# Patient Record
Sex: Female | Born: 1945 | Race: White | Hispanic: No | Smoking: Never smoker
Health system: Southern US, Community
[De-identification: ages and names within clinical notes are randomized; demographics above are authoritative.]

## PROBLEM LIST (undated history)

## (undated) DIAGNOSIS — F329 Major depressive disorder, single episode, unspecified: Secondary | ICD-10-CM

## (undated) DIAGNOSIS — F419 Anxiety disorder, unspecified: Secondary | ICD-10-CM

## (undated) DIAGNOSIS — J40 Bronchitis, not specified as acute or chronic: Secondary | ICD-10-CM

## (undated) DIAGNOSIS — I482 Chronic atrial fibrillation, unspecified: Secondary | ICD-10-CM

## (undated) DIAGNOSIS — I119 Hypertensive heart disease without heart failure: Secondary | ICD-10-CM

## (undated) DIAGNOSIS — K7460 Unspecified cirrhosis of liver: Secondary | ICD-10-CM

## (undated) DIAGNOSIS — I252 Old myocardial infarction: Secondary | ICD-10-CM

## (undated) DIAGNOSIS — K580 Irritable bowel syndrome with diarrhea: Secondary | ICD-10-CM

## (undated) DIAGNOSIS — R404 Transient alteration of awareness: Secondary | ICD-10-CM

## (undated) DIAGNOSIS — F411 Generalized anxiety disorder: Secondary | ICD-10-CM

## (undated) DIAGNOSIS — M797 Fibromyalgia: Secondary | ICD-10-CM

## (undated) DIAGNOSIS — I509 Heart failure, unspecified: Secondary | ICD-10-CM

## (undated) DIAGNOSIS — G629 Polyneuropathy, unspecified: Secondary | ICD-10-CM

## (undated) DIAGNOSIS — M21372 Foot drop, left foot: Secondary | ICD-10-CM

## (undated) DIAGNOSIS — K219 Gastro-esophageal reflux disease without esophagitis: Secondary | ICD-10-CM

## (undated) DIAGNOSIS — R600 Localized edema: Secondary | ICD-10-CM

## (undated) DIAGNOSIS — I2510 Atherosclerotic heart disease of native coronary artery without angina pectoris: Secondary | ICD-10-CM

## (undated) DIAGNOSIS — I10 Essential (primary) hypertension: Secondary | ICD-10-CM

## (undated) DIAGNOSIS — E119 Type 2 diabetes mellitus without complications: Secondary | ICD-10-CM

## (undated) DIAGNOSIS — F32A Depression, unspecified: Secondary | ICD-10-CM

## (undated) DIAGNOSIS — R161 Splenomegaly, not elsewhere classified: Secondary | ICD-10-CM

## (undated) DIAGNOSIS — K769 Liver disease, unspecified: Secondary | ICD-10-CM

## (undated) DIAGNOSIS — Z9989 Dependence on other enabling machines and devices: Secondary | ICD-10-CM

## (undated) DIAGNOSIS — G4730 Sleep apnea, unspecified: Secondary | ICD-10-CM

## (undated) DIAGNOSIS — G4733 Obstructive sleep apnea (adult) (pediatric): Secondary | ICD-10-CM

## (undated) DIAGNOSIS — E7800 Pure hypercholesterolemia, unspecified: Secondary | ICD-10-CM

## (undated) DIAGNOSIS — D696 Thrombocytopenia, unspecified: Secondary | ICD-10-CM

## (undated) DIAGNOSIS — E782 Mixed hyperlipidemia: Secondary | ICD-10-CM

## (undated) DIAGNOSIS — M1990 Unspecified osteoarthritis, unspecified site: Secondary | ICD-10-CM

## (undated) DIAGNOSIS — I219 Acute myocardial infarction, unspecified: Secondary | ICD-10-CM

## (undated) DIAGNOSIS — J302 Other seasonal allergic rhinitis: Secondary | ICD-10-CM

## (undated) DIAGNOSIS — N393 Stress incontinence (female) (male): Secondary | ICD-10-CM

## (undated) DIAGNOSIS — T4145XA Adverse effect of unspecified anesthetic, initial encounter: Secondary | ICD-10-CM

## (undated) DIAGNOSIS — H9190 Unspecified hearing loss, unspecified ear: Secondary | ICD-10-CM

## (undated) DIAGNOSIS — I5032 Chronic diastolic (congestive) heart failure: Secondary | ICD-10-CM

## (undated) DIAGNOSIS — T8859XA Other complications of anesthesia, initial encounter: Secondary | ICD-10-CM

## (undated) DIAGNOSIS — N3944 Nocturnal enuresis: Secondary | ICD-10-CM

## (undated) DIAGNOSIS — E039 Hypothyroidism, unspecified: Secondary | ICD-10-CM

## (undated) DIAGNOSIS — I517 Cardiomegaly: Secondary | ICD-10-CM

## (undated) DIAGNOSIS — E785 Hyperlipidemia, unspecified: Secondary | ICD-10-CM

## (undated) DIAGNOSIS — M21371 Foot drop, right foot: Secondary | ICD-10-CM

## (undated) DIAGNOSIS — R400 Somnolence: Secondary | ICD-10-CM

## (undated) DIAGNOSIS — J3089 Other allergic rhinitis: Secondary | ICD-10-CM

## (undated) HISTORY — DX: Obstructive sleep apnea (adult) (pediatric): G47.33

## (undated) HISTORY — PX: TOTAL ABDOMINAL HYSTERECTOMY: SHX209

## (undated) HISTORY — PX: SPLENECTOMY: SUR1306

## (undated) HISTORY — DX: Anxiety disorder, unspecified: F41.9

## (undated) HISTORY — DX: Atherosclerotic heart disease of native coronary artery without angina pectoris: I25.10

## (undated) HISTORY — PX: EYE SURGERY: SHX253

## (undated) HISTORY — DX: Liver disease, unspecified: K76.9

## (undated) HISTORY — DX: Heart failure, unspecified: I50.9

## (undated) HISTORY — DX: Transient alteration of awareness: R40.4

## (undated) HISTORY — DX: Somnolence: R40.0

## (undated) HISTORY — DX: Depression, unspecified: F32.A

## (undated) HISTORY — DX: Hypothyroidism, unspecified: E03.9

## (undated) HISTORY — DX: Hypertensive heart disease without heart failure: I11.9

## (undated) HISTORY — PX: ABDOMINAL HYSTERECTOMY: SHX81

## (undated) HISTORY — PX: BACK SURGERY: SHX140

## (undated) HISTORY — DX: Dependence on other enabling machines and devices: Z99.89

## (undated) HISTORY — DX: Major depressive disorder, single episode, unspecified: F32.9

## (undated) HISTORY — PX: CARPAL TUNNEL RELEASE: SHX101

## (undated) HISTORY — PX: LUMBAR LAMINECTOMY: SHX95

## (undated) HISTORY — PX: MOUTH SURGERY: SHX715

## (undated) HISTORY — DX: Chronic atrial fibrillation, unspecified: I48.20

## (undated) HISTORY — DX: Essential (primary) hypertension: I10

## (undated) HISTORY — PX: APPENDECTOMY: SHX54

## (undated) HISTORY — DX: Bronchitis, not specified as acute or chronic: J40

## (undated) HISTORY — PX: KNEE ARTHROSCOPY: SUR90

## (undated) HISTORY — DX: Hyperlipidemia, unspecified: E78.5

## (undated) HISTORY — DX: Type 2 diabetes mellitus without complications: E11.9

## (undated) HISTORY — DX: Other seasonal allergic rhinitis: J30.2

## (undated) HISTORY — DX: Generalized anxiety disorder: F41.1

## (undated) HISTORY — PX: TONSILLECTOMY AND ADENOIDECTOMY: SUR1326

## (undated) HISTORY — DX: Other allergic rhinitis: J30.89

## (undated) HISTORY — PX: CATARACT EXTRACTION W/ INTRAOCULAR LENS  IMPLANT, BILATERAL: SHX1307

## (undated) HISTORY — DX: Chronic diastolic (congestive) heart failure: I50.32

## (undated) HISTORY — DX: Sleep apnea, unspecified: G47.30

## (undated) HISTORY — PX: DILATION AND CURETTAGE OF UTERUS: SHX78

---

## 1967-11-20 HISTORY — PX: TONSILLECTOMY AND ADENOIDECTOMY: SUR1326

## 1983-11-20 HISTORY — PX: CARPAL TUNNEL RELEASE: SHX101

## 1996-11-19 HISTORY — PX: APPENDECTOMY: SHX54

## 1996-11-19 HISTORY — PX: TOTAL ABDOMINAL HYSTERECTOMY W/ BILATERAL SALPINGOOPHORECTOMY: SHX83

## 1997-11-19 HISTORY — PX: CARDIAC CATHETERIZATION: SHX172

## 1998-04-07 ENCOUNTER — Inpatient Hospital Stay (HOSPITAL_COMMUNITY): Admission: EM | Admit: 1998-04-07 | Discharge: 1998-04-09 | Payer: Self-pay | Admitting: Cardiology

## 2000-01-22 ENCOUNTER — Other Ambulatory Visit: Admission: RE | Admit: 2000-01-22 | Discharge: 2000-01-22 | Payer: Self-pay | Admitting: Obstetrics & Gynecology

## 2000-08-09 ENCOUNTER — Encounter: Payer: Self-pay | Admitting: Obstetrics & Gynecology

## 2000-08-09 ENCOUNTER — Encounter: Admission: RE | Admit: 2000-08-09 | Discharge: 2000-08-09 | Payer: Self-pay | Admitting: Obstetrics & Gynecology

## 2001-09-01 ENCOUNTER — Encounter: Admission: RE | Admit: 2001-09-01 | Discharge: 2001-09-01 | Payer: Self-pay | Admitting: Obstetrics & Gynecology

## 2001-09-01 ENCOUNTER — Encounter: Payer: Self-pay | Admitting: Obstetrics & Gynecology

## 2001-11-05 ENCOUNTER — Encounter: Payer: Self-pay | Admitting: Internal Medicine

## 2001-11-05 ENCOUNTER — Ambulatory Visit (HOSPITAL_BASED_OUTPATIENT_CLINIC_OR_DEPARTMENT_OTHER): Admission: RE | Admit: 2001-11-05 | Discharge: 2001-11-05 | Payer: Self-pay | Admitting: Internal Medicine

## 2002-09-08 ENCOUNTER — Encounter: Payer: Self-pay | Admitting: Obstetrics & Gynecology

## 2002-09-08 ENCOUNTER — Encounter: Admission: RE | Admit: 2002-09-08 | Discharge: 2002-09-08 | Payer: Self-pay | Admitting: Obstetrics & Gynecology

## 2003-03-14 ENCOUNTER — Encounter: Payer: Self-pay | Admitting: Internal Medicine

## 2003-03-14 ENCOUNTER — Encounter: Admission: RE | Admit: 2003-03-14 | Discharge: 2003-03-14 | Payer: Self-pay | Admitting: Internal Medicine

## 2003-05-04 ENCOUNTER — Ambulatory Visit (HOSPITAL_BASED_OUTPATIENT_CLINIC_OR_DEPARTMENT_OTHER): Admission: RE | Admit: 2003-05-04 | Discharge: 2003-05-04 | Payer: Self-pay | Admitting: Gastroenterology

## 2003-05-28 ENCOUNTER — Ambulatory Visit (HOSPITAL_BASED_OUTPATIENT_CLINIC_OR_DEPARTMENT_OTHER): Admission: RE | Admit: 2003-05-28 | Discharge: 2003-05-28 | Payer: Self-pay | Admitting: Gastroenterology

## 2003-07-09 ENCOUNTER — Ambulatory Visit (HOSPITAL_BASED_OUTPATIENT_CLINIC_OR_DEPARTMENT_OTHER): Admission: RE | Admit: 2003-07-09 | Discharge: 2003-07-09 | Payer: Self-pay | Admitting: Gastroenterology

## 2003-08-04 ENCOUNTER — Ambulatory Visit (HOSPITAL_BASED_OUTPATIENT_CLINIC_OR_DEPARTMENT_OTHER): Admission: RE | Admit: 2003-08-04 | Discharge: 2003-08-04 | Payer: Self-pay | Admitting: Gastroenterology

## 2003-09-08 ENCOUNTER — Ambulatory Visit (HOSPITAL_BASED_OUTPATIENT_CLINIC_OR_DEPARTMENT_OTHER): Admission: RE | Admit: 2003-09-08 | Discharge: 2003-09-08 | Payer: Self-pay | Admitting: Gastroenterology

## 2003-09-10 ENCOUNTER — Encounter: Payer: Self-pay | Admitting: Obstetrics & Gynecology

## 2003-09-10 ENCOUNTER — Encounter: Admission: RE | Admit: 2003-09-10 | Discharge: 2003-09-10 | Payer: Self-pay | Admitting: Obstetrics & Gynecology

## 2004-01-10 ENCOUNTER — Ambulatory Visit (HOSPITAL_COMMUNITY): Admission: RE | Admit: 2004-01-10 | Discharge: 2004-01-10 | Payer: Self-pay | Admitting: Orthopaedic Surgery

## 2004-02-17 ENCOUNTER — Ambulatory Visit (HOSPITAL_COMMUNITY): Admission: RE | Admit: 2004-02-17 | Discharge: 2004-02-17 | Payer: Self-pay | Admitting: Orthopaedic Surgery

## 2004-02-17 HISTORY — PX: KNEE ARTHROSCOPY: SUR90

## 2004-04-25 ENCOUNTER — Encounter: Admission: RE | Admit: 2004-04-25 | Discharge: 2004-04-25 | Payer: Self-pay | Admitting: Orthopaedic Surgery

## 2004-05-09 ENCOUNTER — Encounter: Admission: RE | Admit: 2004-05-09 | Discharge: 2004-05-09 | Payer: Self-pay | Admitting: Orthopaedic Surgery

## 2004-05-24 ENCOUNTER — Encounter: Admission: RE | Admit: 2004-05-24 | Discharge: 2004-05-24 | Payer: Self-pay | Admitting: Orthopaedic Surgery

## 2004-06-20 ENCOUNTER — Ambulatory Visit (HOSPITAL_COMMUNITY): Admission: RE | Admit: 2004-06-20 | Discharge: 2004-06-20 | Payer: Self-pay | Admitting: Internal Medicine

## 2004-09-21 ENCOUNTER — Encounter: Admission: RE | Admit: 2004-09-21 | Discharge: 2004-09-21 | Payer: Self-pay | Admitting: Obstetrics & Gynecology

## 2004-11-24 ENCOUNTER — Ambulatory Visit: Payer: Self-pay | Admitting: Internal Medicine

## 2004-11-30 ENCOUNTER — Ambulatory Visit: Payer: Self-pay | Admitting: Internal Medicine

## 2004-12-04 ENCOUNTER — Ambulatory Visit: Payer: Self-pay | Admitting: Internal Medicine

## 2004-12-22 ENCOUNTER — Ambulatory Visit: Payer: Self-pay | Admitting: Internal Medicine

## 2005-01-01 ENCOUNTER — Ambulatory Visit: Payer: Self-pay | Admitting: Internal Medicine

## 2005-01-11 ENCOUNTER — Ambulatory Visit: Payer: Self-pay | Admitting: Internal Medicine

## 2005-01-18 ENCOUNTER — Ambulatory Visit: Payer: Self-pay | Admitting: Internal Medicine

## 2005-02-01 ENCOUNTER — Ambulatory Visit: Payer: Self-pay | Admitting: Internal Medicine

## 2005-02-15 ENCOUNTER — Ambulatory Visit: Payer: Self-pay | Admitting: Internal Medicine

## 2005-02-20 ENCOUNTER — Ambulatory Visit: Payer: Self-pay | Admitting: Internal Medicine

## 2005-02-27 ENCOUNTER — Ambulatory Visit: Payer: Self-pay | Admitting: Internal Medicine

## 2005-03-06 ENCOUNTER — Ambulatory Visit: Payer: Self-pay | Admitting: Internal Medicine

## 2005-03-15 ENCOUNTER — Ambulatory Visit: Payer: Self-pay | Admitting: Internal Medicine

## 2005-03-22 ENCOUNTER — Ambulatory Visit: Payer: Self-pay | Admitting: Internal Medicine

## 2005-03-27 ENCOUNTER — Ambulatory Visit: Payer: Self-pay | Admitting: Internal Medicine

## 2005-03-29 ENCOUNTER — Ambulatory Visit: Payer: Self-pay | Admitting: Internal Medicine

## 2005-04-04 ENCOUNTER — Ambulatory Visit: Payer: Self-pay | Admitting: Internal Medicine

## 2005-04-11 ENCOUNTER — Ambulatory Visit: Payer: Self-pay | Admitting: Internal Medicine

## 2005-04-11 ENCOUNTER — Other Ambulatory Visit: Admission: RE | Admit: 2005-04-11 | Discharge: 2005-04-11 | Payer: Self-pay | Admitting: Obstetrics & Gynecology

## 2005-04-19 ENCOUNTER — Ambulatory Visit: Payer: Self-pay | Admitting: Internal Medicine

## 2005-04-26 ENCOUNTER — Ambulatory Visit: Payer: Self-pay | Admitting: Internal Medicine

## 2005-05-03 ENCOUNTER — Ambulatory Visit: Payer: Self-pay | Admitting: Internal Medicine

## 2005-05-10 ENCOUNTER — Ambulatory Visit: Payer: Self-pay | Admitting: Internal Medicine

## 2005-05-18 ENCOUNTER — Ambulatory Visit: Payer: Self-pay | Admitting: Internal Medicine

## 2005-05-25 ENCOUNTER — Ambulatory Visit: Payer: Self-pay | Admitting: Internal Medicine

## 2005-06-05 ENCOUNTER — Ambulatory Visit: Payer: Self-pay | Admitting: Internal Medicine

## 2005-06-29 ENCOUNTER — Ambulatory Visit: Payer: Self-pay | Admitting: Internal Medicine

## 2005-07-04 ENCOUNTER — Ambulatory Visit: Payer: Self-pay | Admitting: Internal Medicine

## 2005-07-11 ENCOUNTER — Ambulatory Visit: Payer: Self-pay | Admitting: Internal Medicine

## 2005-07-20 ENCOUNTER — Ambulatory Visit: Payer: Self-pay | Admitting: Internal Medicine

## 2005-08-02 ENCOUNTER — Ambulatory Visit: Payer: Self-pay | Admitting: Internal Medicine

## 2005-08-09 ENCOUNTER — Ambulatory Visit: Payer: Self-pay | Admitting: Internal Medicine

## 2005-08-17 ENCOUNTER — Ambulatory Visit: Payer: Self-pay | Admitting: Internal Medicine

## 2005-08-28 ENCOUNTER — Ambulatory Visit: Payer: Self-pay | Admitting: Internal Medicine

## 2005-08-31 ENCOUNTER — Ambulatory Visit: Payer: Self-pay | Admitting: Internal Medicine

## 2005-09-06 ENCOUNTER — Ambulatory Visit: Payer: Self-pay | Admitting: Internal Medicine

## 2005-09-13 ENCOUNTER — Ambulatory Visit: Payer: Self-pay | Admitting: Internal Medicine

## 2005-09-14 ENCOUNTER — Ambulatory Visit: Payer: Self-pay | Admitting: Internal Medicine

## 2005-09-18 ENCOUNTER — Ambulatory Visit: Payer: Self-pay | Admitting: Internal Medicine

## 2005-09-27 ENCOUNTER — Ambulatory Visit: Payer: Self-pay | Admitting: Internal Medicine

## 2005-10-30 ENCOUNTER — Ambulatory Visit: Payer: Self-pay | Admitting: Internal Medicine

## 2005-11-08 ENCOUNTER — Ambulatory Visit: Payer: Self-pay | Admitting: Internal Medicine

## 2005-11-13 ENCOUNTER — Ambulatory Visit: Payer: Self-pay | Admitting: Internal Medicine

## 2005-11-13 ENCOUNTER — Encounter: Admission: RE | Admit: 2005-11-13 | Discharge: 2005-11-13 | Payer: Self-pay | Admitting: Internal Medicine

## 2005-11-13 IMAGING — MG MM MAMMO SCREENING
6 series · 6 of 6 positions shown · non-contrast
Comparison: none

SCREENING MAMMOGRAM:

Comparison is made to previous study dated 09/10/03.
The breast tissue is nearly entirely fatty.  There is no dominant mass, architectural distortion or
calcification to suggest malignancy.

[R CC]
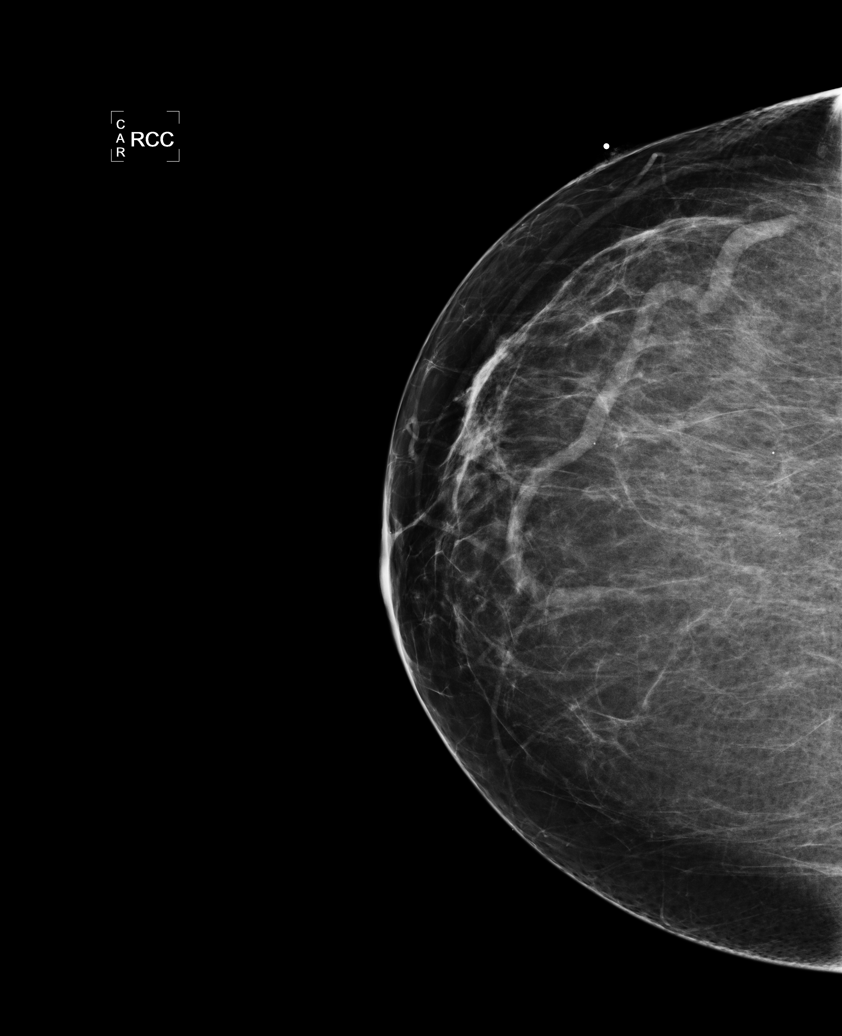

[R MLO (1 of 2)]
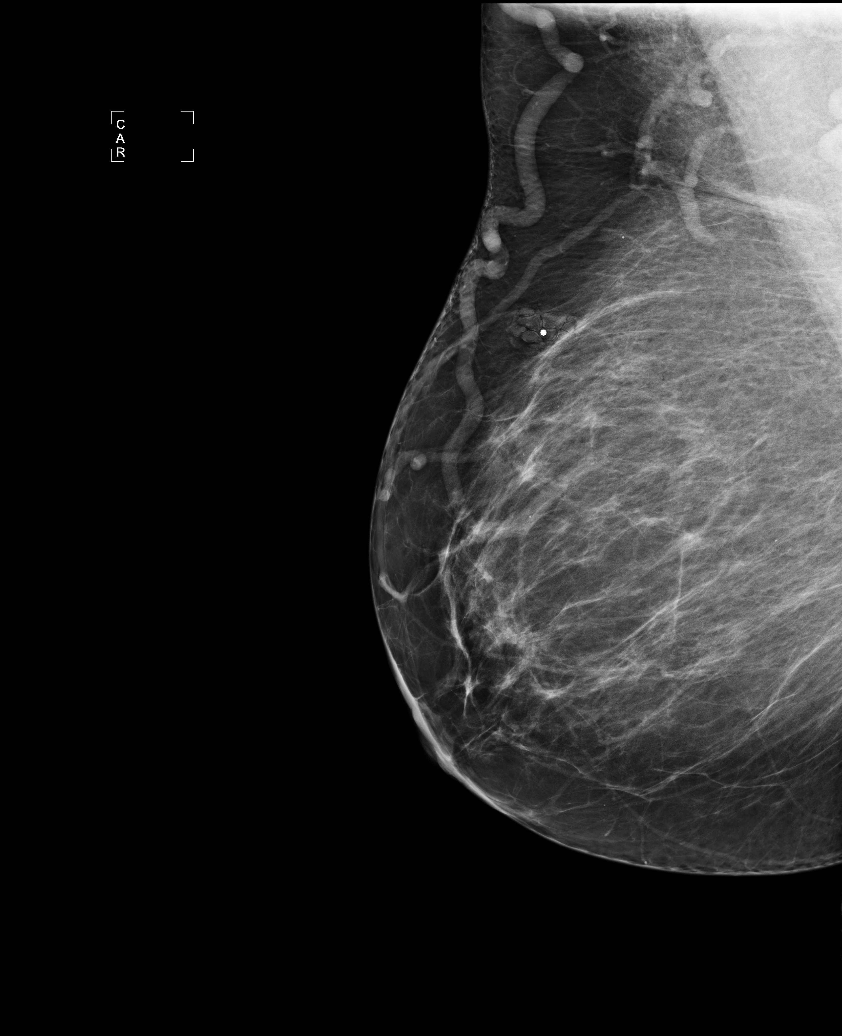

[L CC]
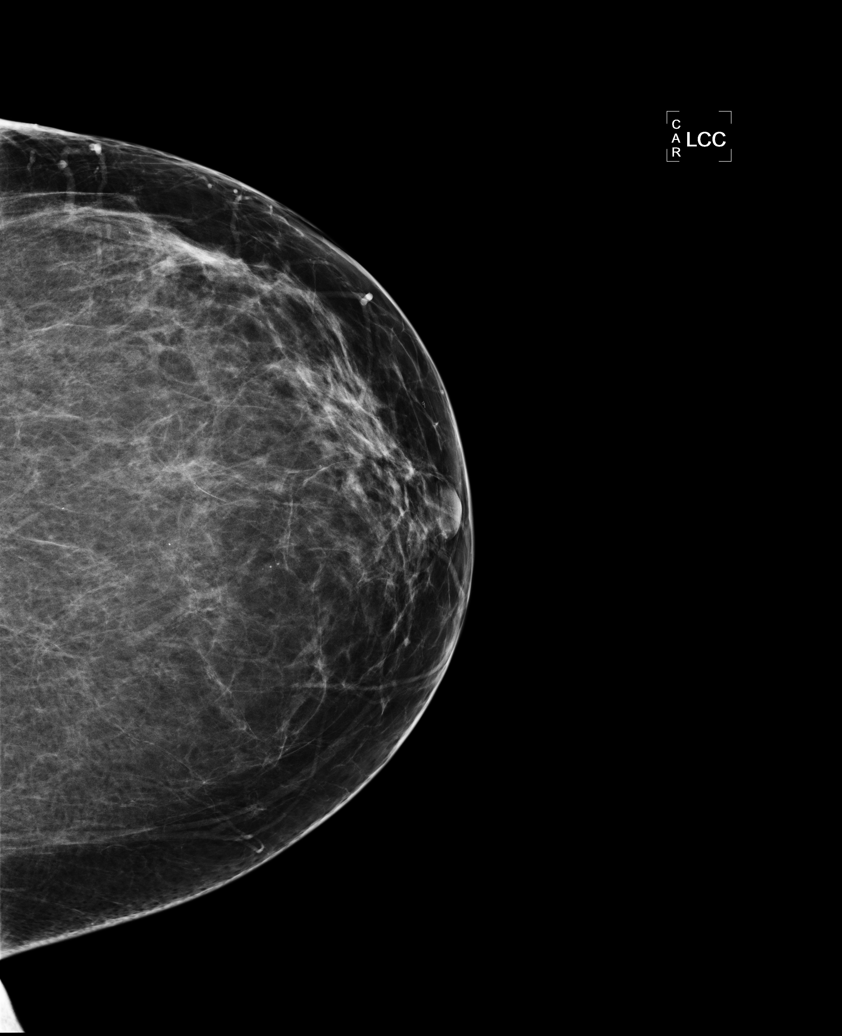

[L MLO (1 of 2)]
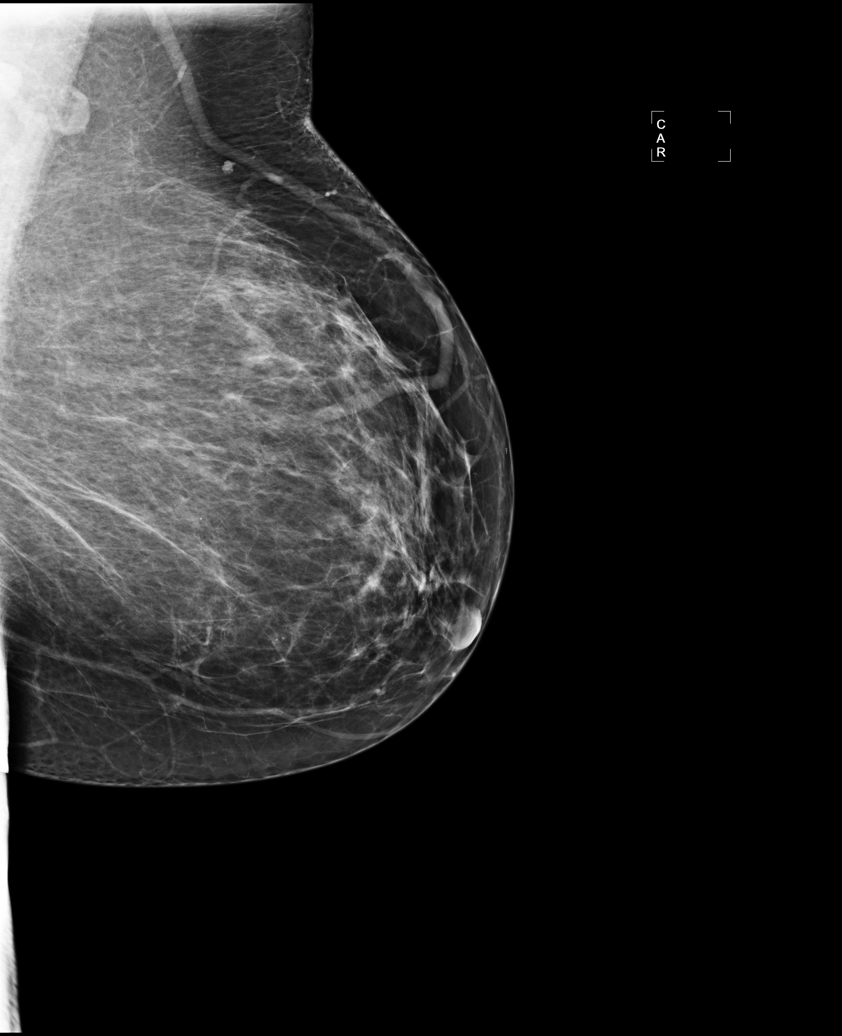

[R MLO (2 of 2)]
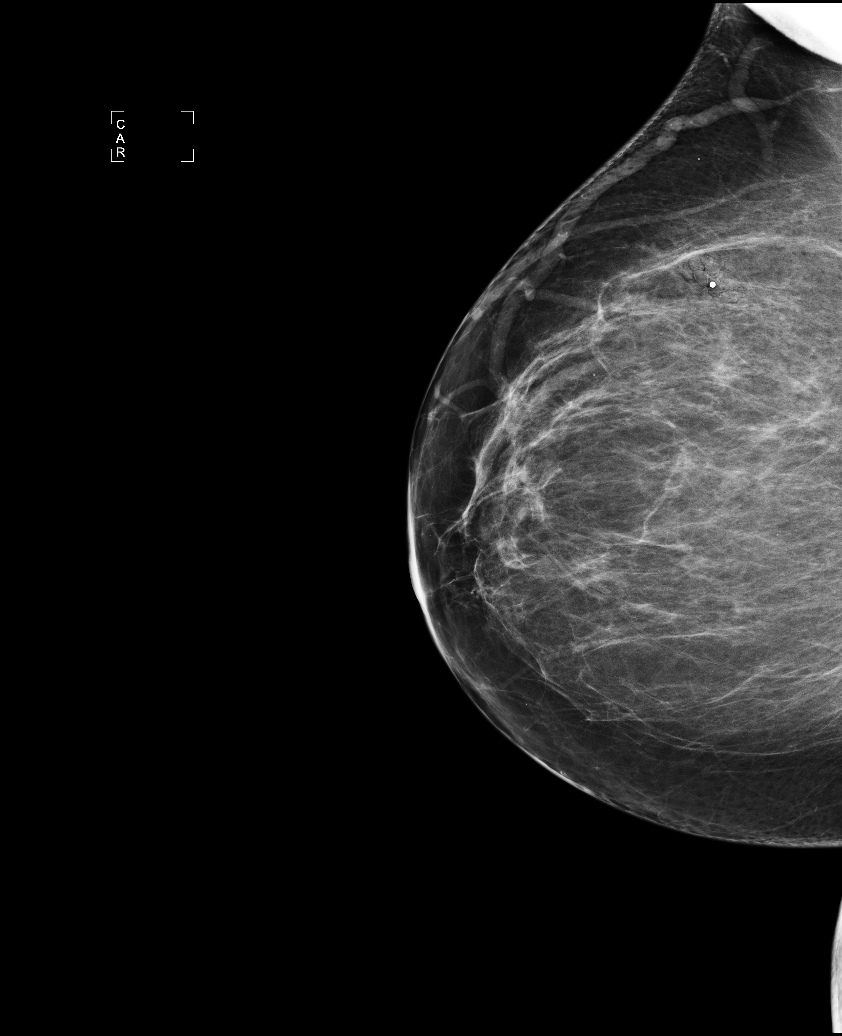

[L MLO (2 of 2)]
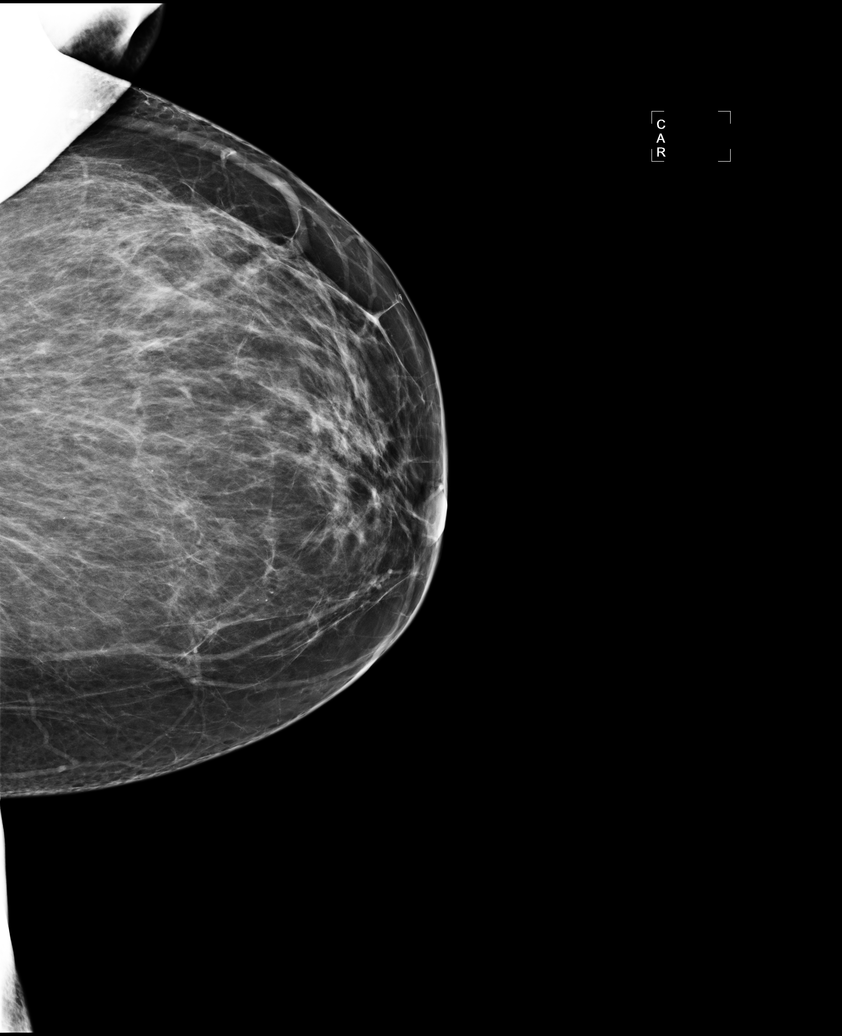

[6 of 6 positions shown; findings below may reference images not displayed]

IMPRESSION: No mammographic evidence of malignancy.  Suggest screening mammography in one year.

ASSESSMENT: Negative - BI-RADS 1

Screening mammogram in 1 year.

## 2005-11-26 ENCOUNTER — Ambulatory Visit: Payer: Self-pay | Admitting: Internal Medicine

## 2005-12-13 ENCOUNTER — Ambulatory Visit: Payer: Self-pay | Admitting: Internal Medicine

## 2005-12-18 ENCOUNTER — Ambulatory Visit: Payer: Self-pay | Admitting: Internal Medicine

## 2006-01-17 ENCOUNTER — Ambulatory Visit: Payer: Self-pay | Admitting: Internal Medicine

## 2006-01-22 ENCOUNTER — Ambulatory Visit: Payer: Self-pay | Admitting: Internal Medicine

## 2006-01-24 ENCOUNTER — Ambulatory Visit: Payer: Self-pay | Admitting: Internal Medicine

## 2006-02-26 ENCOUNTER — Ambulatory Visit: Payer: Self-pay | Admitting: Internal Medicine

## 2006-03-07 ENCOUNTER — Ambulatory Visit: Payer: Self-pay | Admitting: Internal Medicine

## 2006-03-14 ENCOUNTER — Ambulatory Visit: Payer: Self-pay | Admitting: Internal Medicine

## 2006-03-28 ENCOUNTER — Ambulatory Visit: Payer: Self-pay | Admitting: Internal Medicine

## 2006-04-10 ENCOUNTER — Ambulatory Visit: Payer: Self-pay | Admitting: Internal Medicine

## 2006-04-18 ENCOUNTER — Ambulatory Visit: Payer: Self-pay | Admitting: Internal Medicine

## 2006-05-09 ENCOUNTER — Ambulatory Visit: Payer: Self-pay | Admitting: Internal Medicine

## 2006-05-31 ENCOUNTER — Ambulatory Visit: Payer: Self-pay | Admitting: Internal Medicine

## 2006-06-04 ENCOUNTER — Ambulatory Visit: Payer: Self-pay | Admitting: Internal Medicine

## 2006-06-14 ENCOUNTER — Ambulatory Visit: Payer: Self-pay | Admitting: Internal Medicine

## 2006-06-17 ENCOUNTER — Ambulatory Visit: Payer: Self-pay | Admitting: Internal Medicine

## 2006-06-21 ENCOUNTER — Ambulatory Visit: Payer: Self-pay | Admitting: Internal Medicine

## 2006-07-04 ENCOUNTER — Ambulatory Visit: Payer: Self-pay | Admitting: Internal Medicine

## 2006-07-17 ENCOUNTER — Ambulatory Visit: Payer: Self-pay | Admitting: Internal Medicine

## 2006-07-25 ENCOUNTER — Ambulatory Visit: Payer: Self-pay | Admitting: Internal Medicine

## 2006-08-01 ENCOUNTER — Ambulatory Visit: Payer: Self-pay | Admitting: Internal Medicine

## 2006-08-15 ENCOUNTER — Ambulatory Visit: Payer: Self-pay | Admitting: Internal Medicine

## 2006-08-27 ENCOUNTER — Ambulatory Visit: Payer: Self-pay | Admitting: Internal Medicine

## 2006-09-03 ENCOUNTER — Ambulatory Visit: Payer: Self-pay | Admitting: Internal Medicine

## 2006-09-09 ENCOUNTER — Ambulatory Visit: Payer: Self-pay | Admitting: Internal Medicine

## 2006-09-25 ENCOUNTER — Ambulatory Visit: Payer: Self-pay | Admitting: Internal Medicine

## 2006-10-03 ENCOUNTER — Ambulatory Visit: Payer: Self-pay | Admitting: Internal Medicine

## 2006-10-09 ENCOUNTER — Ambulatory Visit: Payer: Self-pay | Admitting: Internal Medicine

## 2006-10-17 ENCOUNTER — Ambulatory Visit: Payer: Self-pay | Admitting: Internal Medicine

## 2006-10-31 ENCOUNTER — Ambulatory Visit: Payer: Self-pay | Admitting: Internal Medicine

## 2006-11-08 ENCOUNTER — Ambulatory Visit: Payer: Self-pay | Admitting: Internal Medicine

## 2006-11-14 ENCOUNTER — Encounter: Admission: RE | Admit: 2006-11-14 | Discharge: 2006-11-14 | Payer: Self-pay | Admitting: Obstetrics & Gynecology

## 2006-11-14 ENCOUNTER — Ambulatory Visit: Payer: Self-pay | Admitting: Internal Medicine

## 2006-11-28 ENCOUNTER — Ambulatory Visit: Payer: Self-pay | Admitting: Internal Medicine

## 2006-11-29 ENCOUNTER — Ambulatory Visit: Payer: Self-pay | Admitting: Internal Medicine

## 2006-12-03 ENCOUNTER — Ambulatory Visit: Payer: Self-pay | Admitting: Internal Medicine

## 2006-12-16 ENCOUNTER — Ambulatory Visit: Payer: Self-pay | Admitting: Internal Medicine

## 2006-12-26 ENCOUNTER — Ambulatory Visit: Payer: Self-pay | Admitting: Internal Medicine

## 2007-01-01 ENCOUNTER — Ambulatory Visit: Payer: Self-pay | Admitting: Internal Medicine

## 2007-01-09 ENCOUNTER — Ambulatory Visit: Payer: Self-pay | Admitting: Internal Medicine

## 2007-01-16 ENCOUNTER — Ambulatory Visit: Payer: Self-pay | Admitting: Internal Medicine

## 2007-01-23 ENCOUNTER — Ambulatory Visit: Payer: Self-pay | Admitting: Internal Medicine

## 2007-01-30 ENCOUNTER — Ambulatory Visit: Payer: Self-pay | Admitting: Internal Medicine

## 2007-02-20 ENCOUNTER — Ambulatory Visit: Payer: Self-pay | Admitting: Internal Medicine

## 2007-02-26 ENCOUNTER — Ambulatory Visit: Payer: Self-pay | Admitting: Internal Medicine

## 2007-03-13 ENCOUNTER — Ambulatory Visit: Payer: Self-pay | Admitting: Internal Medicine

## 2007-03-20 ENCOUNTER — Ambulatory Visit: Payer: Self-pay | Admitting: Internal Medicine

## 2007-04-03 ENCOUNTER — Ambulatory Visit: Payer: Self-pay | Admitting: Internal Medicine

## 2007-04-15 ENCOUNTER — Ambulatory Visit: Payer: Self-pay | Admitting: Internal Medicine

## 2007-05-01 ENCOUNTER — Ambulatory Visit: Payer: Self-pay | Admitting: Internal Medicine

## 2007-05-15 ENCOUNTER — Ambulatory Visit: Payer: Self-pay | Admitting: Internal Medicine

## 2007-05-19 ENCOUNTER — Ambulatory Visit: Payer: Self-pay | Admitting: Internal Medicine

## 2007-05-28 ENCOUNTER — Ambulatory Visit: Payer: Self-pay | Admitting: Internal Medicine

## 2007-06-10 ENCOUNTER — Ambulatory Visit: Payer: Self-pay | Admitting: Internal Medicine

## 2007-06-23 ENCOUNTER — Ambulatory Visit: Payer: Self-pay | Admitting: Internal Medicine

## 2007-06-30 ENCOUNTER — Ambulatory Visit: Payer: Self-pay | Admitting: Internal Medicine

## 2007-07-24 ENCOUNTER — Ambulatory Visit: Payer: Self-pay | Admitting: Internal Medicine

## 2007-08-13 ENCOUNTER — Ambulatory Visit: Payer: Self-pay | Admitting: Internal Medicine

## 2007-08-28 ENCOUNTER — Ambulatory Visit: Payer: Self-pay | Admitting: Internal Medicine

## 2007-09-09 ENCOUNTER — Ambulatory Visit: Payer: Self-pay | Admitting: Internal Medicine

## 2007-09-18 ENCOUNTER — Ambulatory Visit: Payer: Self-pay | Admitting: Internal Medicine

## 2007-09-25 ENCOUNTER — Ambulatory Visit: Payer: Self-pay | Admitting: Internal Medicine

## 2007-10-23 ENCOUNTER — Ambulatory Visit: Payer: Self-pay | Admitting: Internal Medicine

## 2007-10-28 ENCOUNTER — Ambulatory Visit: Payer: Self-pay | Admitting: Internal Medicine

## 2007-11-04 ENCOUNTER — Ambulatory Visit: Payer: Self-pay | Admitting: Internal Medicine

## 2007-11-11 ENCOUNTER — Ambulatory Visit: Payer: Self-pay | Admitting: Internal Medicine

## 2007-11-21 ENCOUNTER — Ambulatory Visit: Payer: Self-pay | Admitting: Internal Medicine

## 2007-11-21 ENCOUNTER — Encounter: Admission: RE | Admit: 2007-11-21 | Discharge: 2007-11-21 | Payer: Self-pay | Admitting: Obstetrics & Gynecology

## 2007-11-27 ENCOUNTER — Ambulatory Visit: Payer: Self-pay | Admitting: Internal Medicine

## 2007-12-08 ENCOUNTER — Ambulatory Visit: Payer: Self-pay | Admitting: Internal Medicine

## 2007-12-15 ENCOUNTER — Ambulatory Visit: Payer: Self-pay | Admitting: Internal Medicine

## 2007-12-25 ENCOUNTER — Ambulatory Visit: Payer: Self-pay | Admitting: Internal Medicine

## 2007-12-26 DIAGNOSIS — F329 Major depressive disorder, single episode, unspecified: Secondary | ICD-10-CM

## 2007-12-26 DIAGNOSIS — J410 Simple chronic bronchitis: Secondary | ICD-10-CM | POA: Insufficient documentation

## 2007-12-26 DIAGNOSIS — I10 Essential (primary) hypertension: Secondary | ICD-10-CM

## 2007-12-26 DIAGNOSIS — F411 Generalized anxiety disorder: Secondary | ICD-10-CM | POA: Insufficient documentation

## 2007-12-26 DIAGNOSIS — J40 Bronchitis, not specified as acute or chronic: Secondary | ICD-10-CM

## 2007-12-26 DIAGNOSIS — I509 Heart failure, unspecified: Secondary | ICD-10-CM | POA: Insufficient documentation

## 2007-12-26 DIAGNOSIS — R404 Transient alteration of awareness: Secondary | ICD-10-CM

## 2007-12-26 DIAGNOSIS — J309 Allergic rhinitis, unspecified: Secondary | ICD-10-CM | POA: Insufficient documentation

## 2007-12-26 HISTORY — DX: Heart failure, unspecified: I50.9

## 2007-12-26 HISTORY — DX: Simple chronic bronchitis: J41.0

## 2007-12-26 HISTORY — DX: Transient alteration of awareness: R40.4

## 2007-12-26 HISTORY — DX: Generalized anxiety disorder: F41.1

## 2007-12-26 HISTORY — DX: Essential (primary) hypertension: I10

## 2007-12-29 ENCOUNTER — Ambulatory Visit: Payer: Self-pay | Admitting: Internal Medicine

## 2007-12-29 DIAGNOSIS — E119 Type 2 diabetes mellitus without complications: Secondary | ICD-10-CM | POA: Insufficient documentation

## 2007-12-29 HISTORY — DX: Type 2 diabetes mellitus without complications: E11.9

## 2007-12-30 ENCOUNTER — Ambulatory Visit: Payer: Self-pay | Admitting: Internal Medicine

## 2008-01-20 ENCOUNTER — Ambulatory Visit: Payer: Self-pay | Admitting: Internal Medicine

## 2008-01-29 ENCOUNTER — Ambulatory Visit: Payer: Self-pay | Admitting: Internal Medicine

## 2008-02-12 ENCOUNTER — Ambulatory Visit: Payer: Self-pay | Admitting: Internal Medicine

## 2008-02-24 ENCOUNTER — Ambulatory Visit: Payer: Self-pay | Admitting: Internal Medicine

## 2008-03-02 ENCOUNTER — Ambulatory Visit: Payer: Self-pay | Admitting: Internal Medicine

## 2008-04-01 ENCOUNTER — Ambulatory Visit: Payer: Self-pay | Admitting: Internal Medicine

## 2008-04-07 ENCOUNTER — Ambulatory Visit: Payer: Self-pay | Admitting: Internal Medicine

## 2008-04-15 ENCOUNTER — Ambulatory Visit: Payer: Self-pay | Admitting: Internal Medicine

## 2008-04-22 ENCOUNTER — Ambulatory Visit: Payer: Self-pay | Admitting: Internal Medicine

## 2008-04-26 ENCOUNTER — Ambulatory Visit: Payer: Self-pay | Admitting: Internal Medicine

## 2008-05-20 ENCOUNTER — Ambulatory Visit: Payer: Self-pay | Admitting: Internal Medicine

## 2008-05-27 ENCOUNTER — Ambulatory Visit: Payer: Self-pay | Admitting: Internal Medicine

## 2008-06-07 ENCOUNTER — Ambulatory Visit: Payer: Self-pay | Admitting: Internal Medicine

## 2008-06-28 ENCOUNTER — Ambulatory Visit: Payer: Self-pay | Admitting: Internal Medicine

## 2008-07-04 DIAGNOSIS — Z9989 Dependence on other enabling machines and devices: Secondary | ICD-10-CM

## 2008-07-04 DIAGNOSIS — G4733 Obstructive sleep apnea (adult) (pediatric): Secondary | ICD-10-CM

## 2008-07-04 HISTORY — DX: Obstructive sleep apnea (adult) (pediatric): G47.33

## 2008-07-12 ENCOUNTER — Ambulatory Visit: Payer: Self-pay | Admitting: Internal Medicine

## 2008-07-20 ENCOUNTER — Ambulatory Visit: Payer: Self-pay | Admitting: Internal Medicine

## 2008-07-28 ENCOUNTER — Ambulatory Visit: Payer: Self-pay | Admitting: Internal Medicine

## 2008-08-04 ENCOUNTER — Ambulatory Visit: Payer: Self-pay | Admitting: Internal Medicine

## 2008-08-11 ENCOUNTER — Ambulatory Visit: Payer: Self-pay | Admitting: Internal Medicine

## 2008-08-17 ENCOUNTER — Ambulatory Visit: Payer: Self-pay | Admitting: Internal Medicine

## 2008-08-23 ENCOUNTER — Ambulatory Visit: Payer: Self-pay | Admitting: Internal Medicine

## 2008-09-01 ENCOUNTER — Ambulatory Visit: Payer: Self-pay | Admitting: Internal Medicine

## 2008-09-08 ENCOUNTER — Ambulatory Visit: Payer: Self-pay | Admitting: Internal Medicine

## 2008-09-21 ENCOUNTER — Ambulatory Visit: Payer: Self-pay | Admitting: Internal Medicine

## 2008-09-28 ENCOUNTER — Ambulatory Visit: Payer: Self-pay | Admitting: Internal Medicine

## 2008-10-21 ENCOUNTER — Ambulatory Visit: Payer: Self-pay | Admitting: Internal Medicine

## 2008-10-25 ENCOUNTER — Ambulatory Visit: Payer: Self-pay | Admitting: Internal Medicine

## 2008-11-23 ENCOUNTER — Ambulatory Visit: Payer: Self-pay | Admitting: Internal Medicine

## 2008-12-01 ENCOUNTER — Ambulatory Visit: Payer: Self-pay | Admitting: Internal Medicine

## 2008-12-09 ENCOUNTER — Encounter: Admission: RE | Admit: 2008-12-09 | Discharge: 2008-12-09 | Payer: Self-pay | Admitting: Obstetrics & Gynecology

## 2008-12-09 ENCOUNTER — Ambulatory Visit: Payer: Self-pay | Admitting: Internal Medicine

## 2008-12-13 ENCOUNTER — Ambulatory Visit: Payer: Self-pay | Admitting: Internal Medicine

## 2008-12-29 ENCOUNTER — Ambulatory Visit: Payer: Self-pay | Admitting: Internal Medicine

## 2009-01-10 ENCOUNTER — Ambulatory Visit: Payer: Self-pay | Admitting: Internal Medicine

## 2009-01-11 ENCOUNTER — Ambulatory Visit: Payer: Self-pay | Admitting: Cardiovascular Disease

## 2009-01-12 ENCOUNTER — Ambulatory Visit: Payer: Self-pay | Admitting: Internal Medicine

## 2009-01-13 ENCOUNTER — Telehealth (INDEPENDENT_AMBULATORY_CARE_PROVIDER_SITE_OTHER): Payer: Self-pay | Admitting: *Deleted

## 2009-01-28 ENCOUNTER — Ambulatory Visit: Payer: Self-pay | Admitting: Internal Medicine

## 2009-02-03 ENCOUNTER — Ambulatory Visit: Payer: Self-pay | Admitting: Internal Medicine

## 2009-02-21 ENCOUNTER — Ambulatory Visit: Payer: Self-pay | Admitting: Internal Medicine

## 2009-02-28 ENCOUNTER — Ambulatory Visit: Payer: Self-pay | Admitting: Internal Medicine

## 2009-03-04 ENCOUNTER — Encounter: Payer: Self-pay | Admitting: Internal Medicine

## 2009-03-14 ENCOUNTER — Encounter: Payer: Self-pay | Admitting: Internal Medicine

## 2009-03-15 ENCOUNTER — Ambulatory Visit: Payer: Self-pay | Admitting: Internal Medicine

## 2009-03-22 ENCOUNTER — Encounter: Payer: Self-pay | Admitting: Internal Medicine

## 2009-03-28 ENCOUNTER — Ambulatory Visit: Payer: Self-pay | Admitting: Internal Medicine

## 2009-04-04 ENCOUNTER — Ambulatory Visit: Payer: Self-pay | Admitting: Internal Medicine

## 2009-04-11 ENCOUNTER — Ambulatory Visit: Payer: Self-pay | Admitting: Internal Medicine

## 2009-04-20 ENCOUNTER — Ambulatory Visit: Payer: Self-pay | Admitting: Internal Medicine

## 2009-05-05 ENCOUNTER — Ambulatory Visit: Payer: Self-pay | Admitting: Internal Medicine

## 2009-05-19 ENCOUNTER — Ambulatory Visit: Payer: Self-pay | Admitting: Internal Medicine

## 2009-05-27 ENCOUNTER — Ambulatory Visit: Payer: Self-pay | Admitting: Internal Medicine

## 2009-06-02 ENCOUNTER — Ambulatory Visit: Payer: Self-pay | Admitting: Internal Medicine

## 2009-06-13 ENCOUNTER — Ambulatory Visit: Payer: Self-pay | Admitting: Internal Medicine

## 2009-06-17 ENCOUNTER — Encounter: Payer: Self-pay | Admitting: Internal Medicine

## 2009-06-22 ENCOUNTER — Encounter: Payer: Self-pay | Admitting: Internal Medicine

## 2009-07-06 ENCOUNTER — Ambulatory Visit: Payer: Self-pay | Admitting: Internal Medicine

## 2009-07-11 ENCOUNTER — Ambulatory Visit: Payer: Self-pay | Admitting: Internal Medicine

## 2009-07-20 ENCOUNTER — Ambulatory Visit: Payer: Self-pay | Admitting: Internal Medicine

## 2009-07-26 ENCOUNTER — Ambulatory Visit: Payer: Self-pay | Admitting: Internal Medicine

## 2009-08-05 ENCOUNTER — Ambulatory Visit: Payer: Self-pay | Admitting: Internal Medicine

## 2009-08-17 ENCOUNTER — Ambulatory Visit: Payer: Self-pay | Admitting: Internal Medicine

## 2009-09-19 ENCOUNTER — Ambulatory Visit: Payer: Self-pay | Admitting: Internal Medicine

## 2009-10-06 ENCOUNTER — Ambulatory Visit: Payer: Self-pay | Admitting: Internal Medicine

## 2009-10-25 ENCOUNTER — Ambulatory Visit: Payer: Self-pay | Admitting: Internal Medicine

## 2009-11-03 ENCOUNTER — Encounter: Payer: Self-pay | Admitting: Internal Medicine

## 2009-11-16 ENCOUNTER — Ambulatory Visit: Payer: Self-pay | Admitting: Internal Medicine

## 2009-11-22 ENCOUNTER — Ambulatory Visit: Payer: Self-pay | Admitting: Internal Medicine

## 2009-12-08 ENCOUNTER — Ambulatory Visit: Payer: Self-pay | Admitting: Internal Medicine

## 2009-12-19 ENCOUNTER — Ambulatory Visit: Payer: Self-pay | Admitting: Internal Medicine

## 2009-12-27 ENCOUNTER — Ambulatory Visit: Payer: Self-pay | Admitting: Internal Medicine

## 2010-01-03 ENCOUNTER — Ambulatory Visit: Payer: Self-pay | Admitting: Internal Medicine

## 2010-01-03 ENCOUNTER — Encounter: Admission: RE | Admit: 2010-01-03 | Discharge: 2010-01-03 | Payer: Self-pay | Admitting: Obstetrics & Gynecology

## 2010-01-03 IMAGING — MG MM DIGITAL SCREENING
6 series · 6 of 6 positions shown · non-contrast
Comparison: none

DG SCREEN MAMMOGRAM BILATERAL
Bilateral CC and MLO view(s) were taken.

DIGITAL SCREENING MAMMOGRAM WITH CAD:
There are scattered fibroglandular densities.  No masses or malignant type calcifications are 
identified.  Compared with prior studies.
Images were processed with CAD.

[R CC]
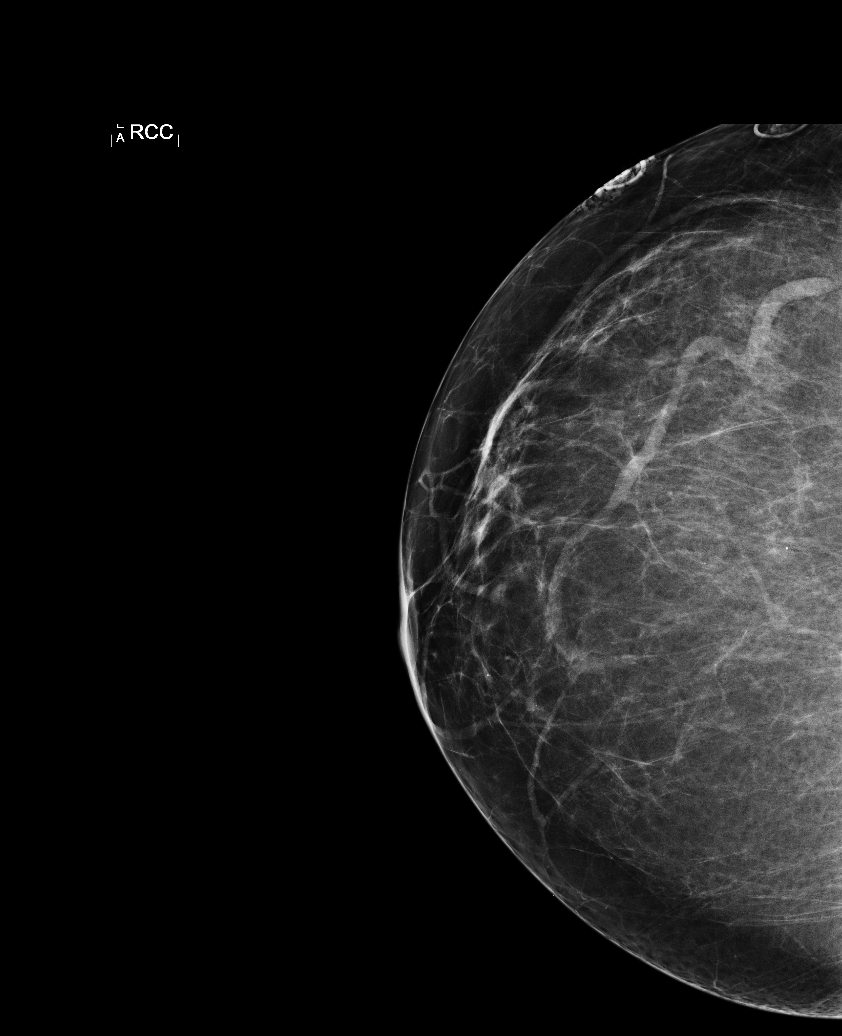

[L CC]
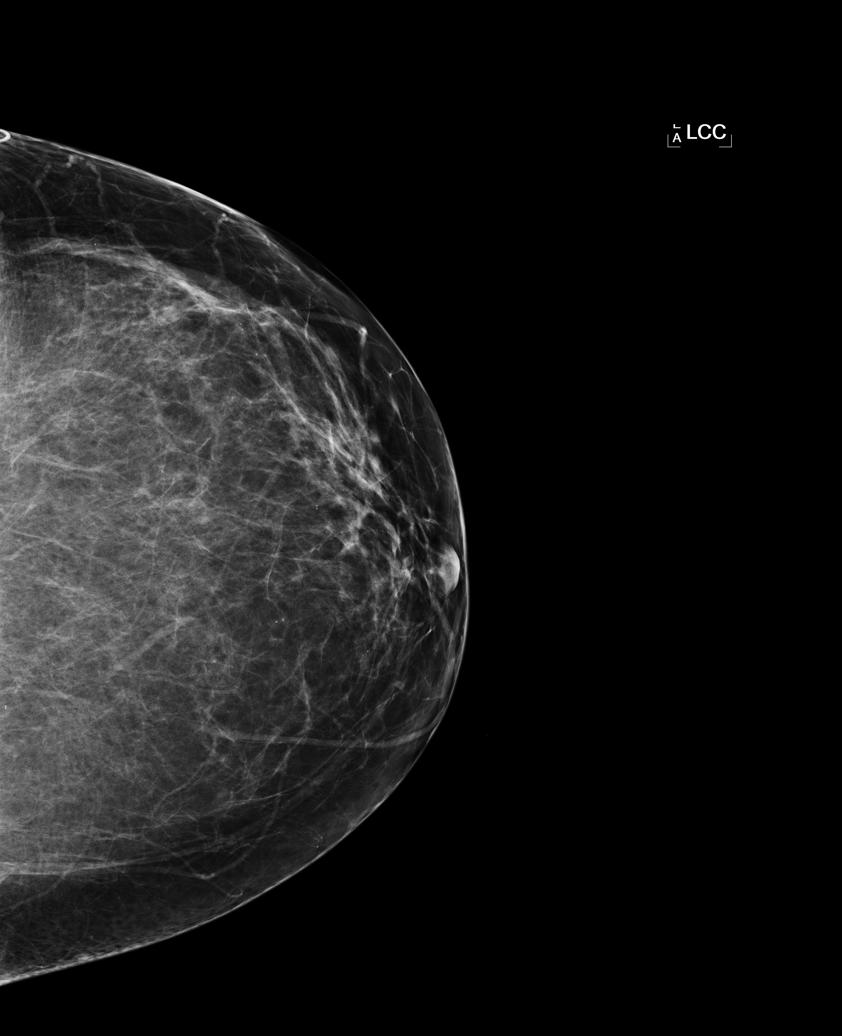

[L MLO (1 of 2)]
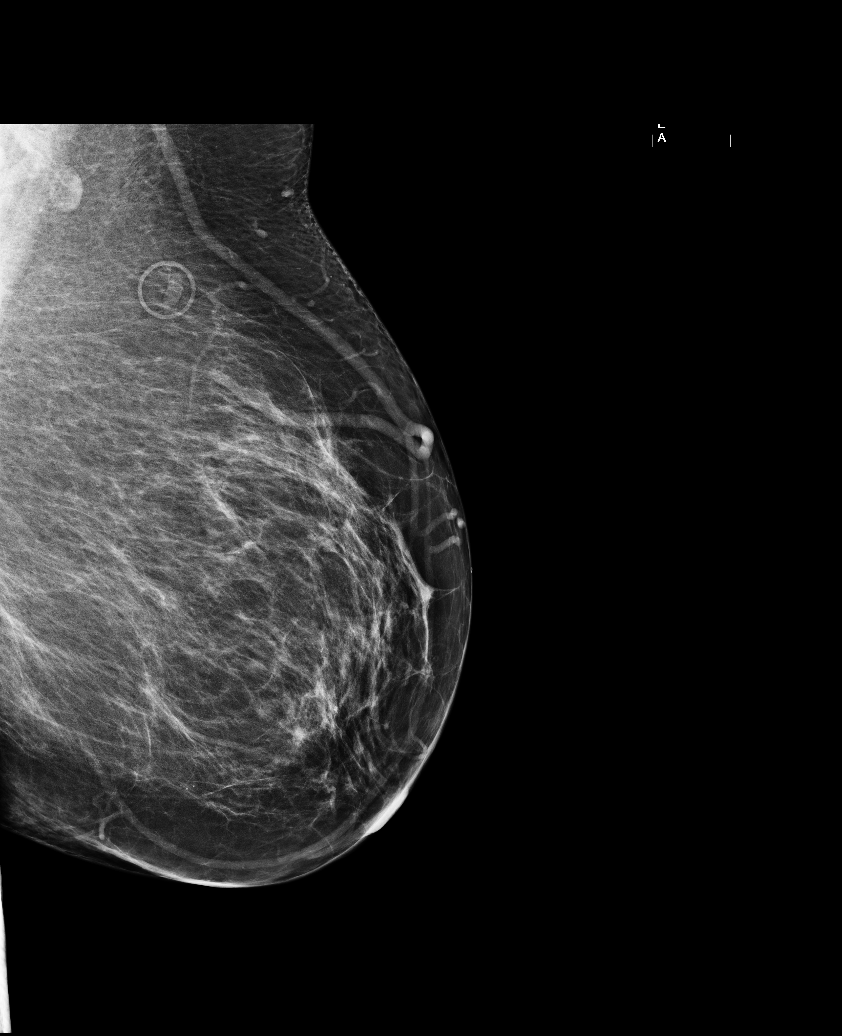

[R MLO (1 of 2)]
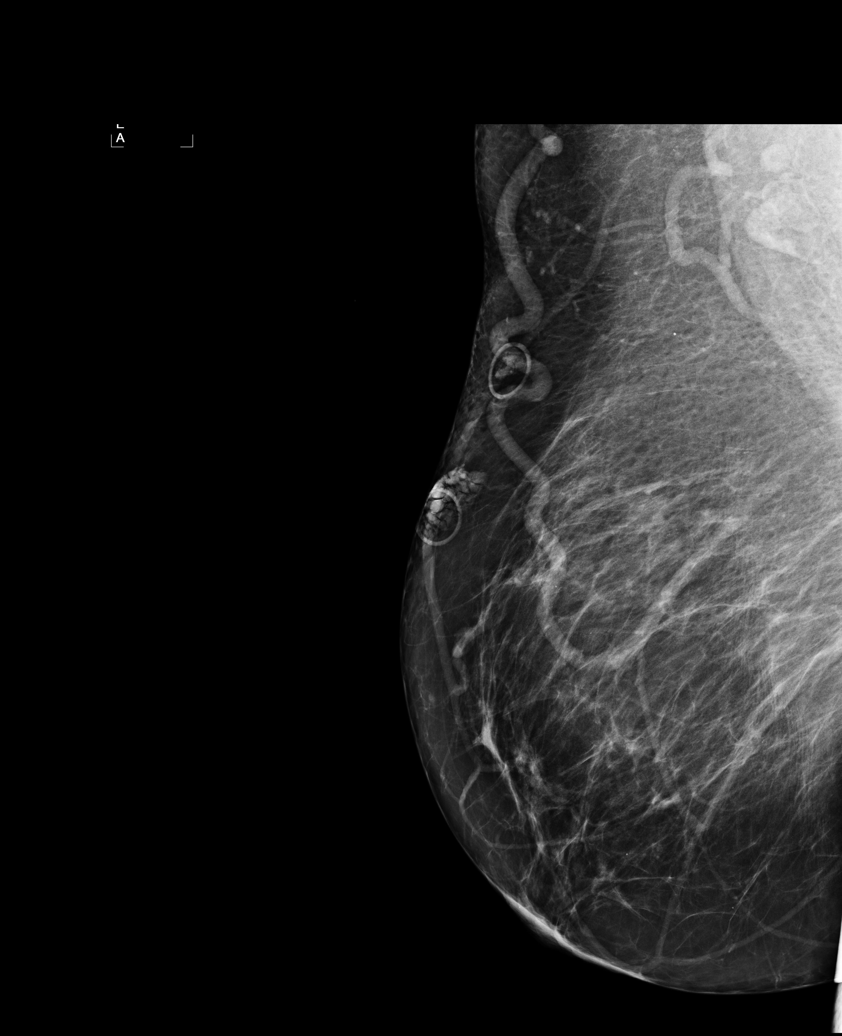

[L MLO (2 of 2)]
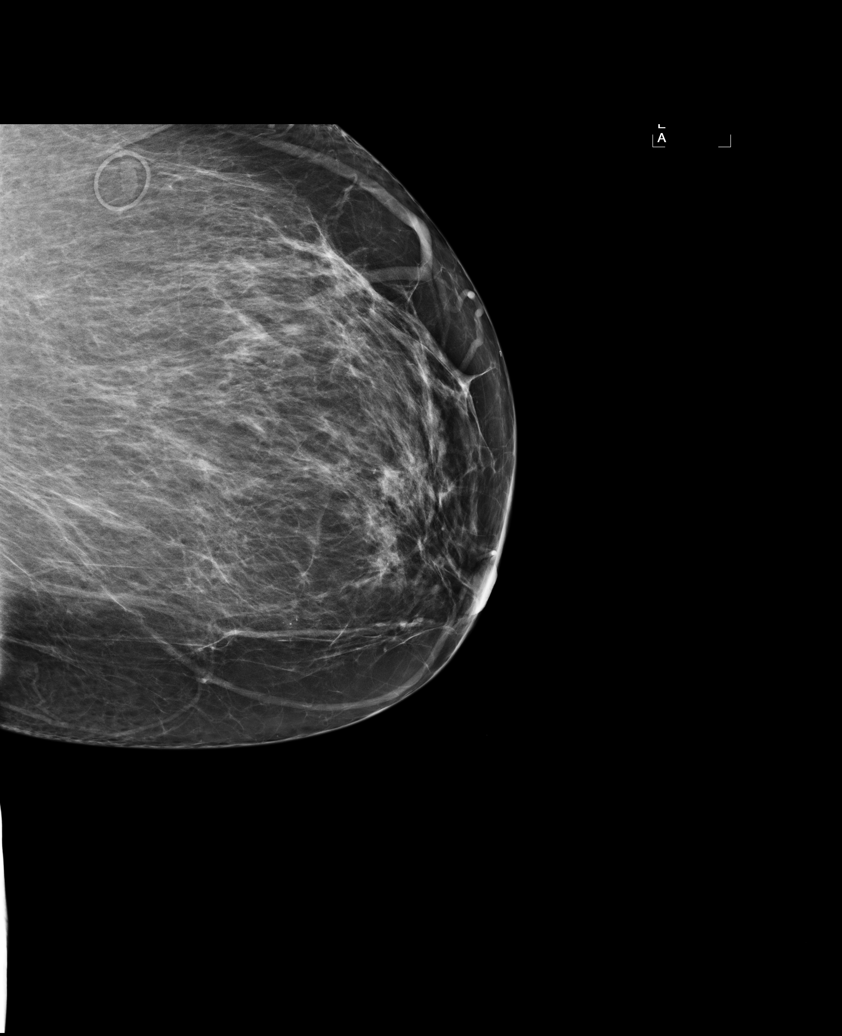

[R MLO (2 of 2)]
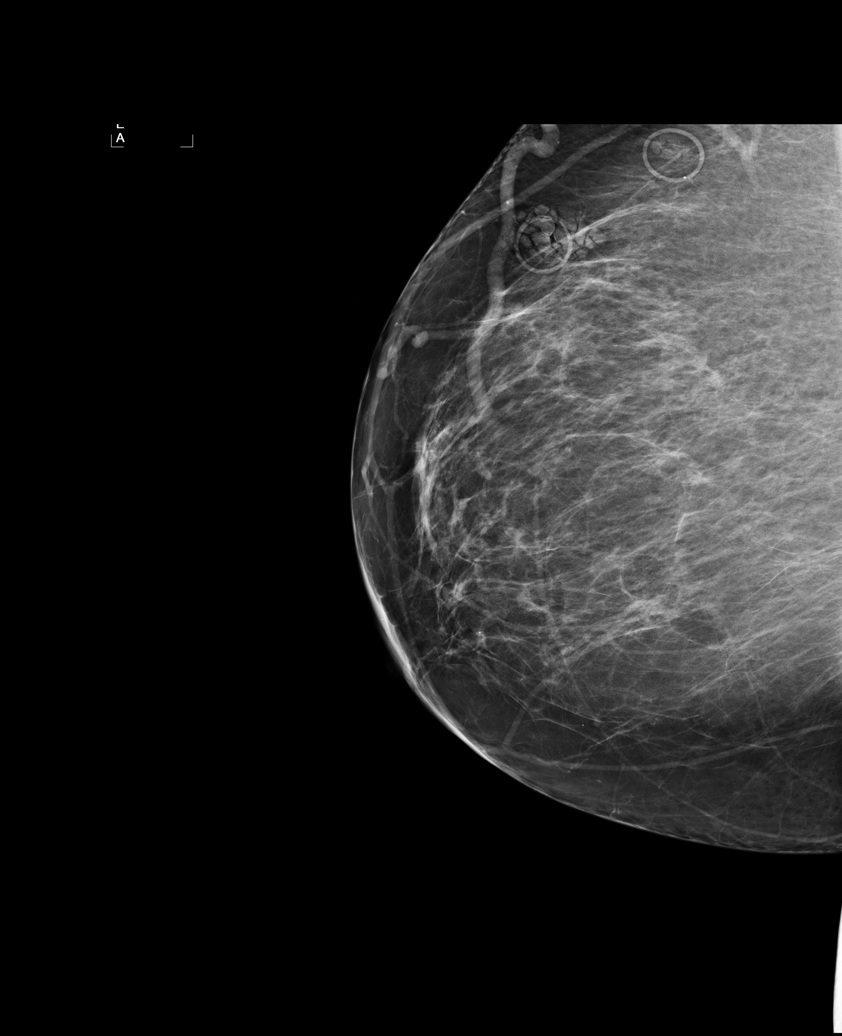

[6 of 6 positions shown; findings below may reference images not displayed]

IMPRESSION: No specific mammographic evidence of malignancy.  Next screening mammogram is recommended in one 
year.

A result letter of this screening mammogram will be mailed directly to the patient.

ASSESSMENT: Negative - BI-RADS 1

Screening mammogram in 1 year.
,

## 2010-01-10 ENCOUNTER — Ambulatory Visit: Payer: Self-pay | Admitting: Internal Medicine

## 2010-01-25 ENCOUNTER — Ambulatory Visit: Payer: Self-pay | Admitting: Internal Medicine

## 2010-02-02 ENCOUNTER — Ambulatory Visit: Payer: Self-pay | Admitting: Internal Medicine

## 2010-02-15 ENCOUNTER — Ambulatory Visit: Payer: Self-pay | Admitting: Internal Medicine

## 2010-02-22 ENCOUNTER — Ambulatory Visit: Payer: Self-pay | Admitting: Internal Medicine

## 2010-03-08 ENCOUNTER — Ambulatory Visit: Payer: Self-pay | Admitting: Internal Medicine

## 2010-03-17 ENCOUNTER — Ambulatory Visit: Payer: Self-pay | Admitting: Internal Medicine

## 2010-03-27 ENCOUNTER — Ambulatory Visit: Payer: Self-pay | Admitting: Internal Medicine

## 2010-04-19 ENCOUNTER — Ambulatory Visit: Payer: Self-pay | Admitting: Internal Medicine

## 2010-04-25 ENCOUNTER — Ambulatory Visit: Payer: Self-pay | Admitting: Internal Medicine

## 2010-04-28 ENCOUNTER — Encounter: Admission: RE | Admit: 2010-04-28 | Discharge: 2010-04-28 | Payer: Self-pay | Admitting: Internal Medicine

## 2010-05-01 ENCOUNTER — Ambulatory Visit: Payer: Self-pay | Admitting: Internal Medicine

## 2010-05-08 ENCOUNTER — Ambulatory Visit: Payer: Self-pay | Admitting: Internal Medicine

## 2010-05-17 ENCOUNTER — Encounter: Payer: Self-pay | Admitting: Internal Medicine

## 2010-05-23 ENCOUNTER — Ambulatory Visit: Payer: Self-pay | Admitting: Internal Medicine

## 2010-06-06 ENCOUNTER — Ambulatory Visit: Payer: Self-pay | Admitting: Internal Medicine

## 2010-06-16 ENCOUNTER — Ambulatory Visit: Payer: Self-pay | Admitting: Internal Medicine

## 2010-07-03 ENCOUNTER — Ambulatory Visit: Payer: Self-pay | Admitting: Internal Medicine

## 2010-07-10 ENCOUNTER — Ambulatory Visit: Payer: Self-pay | Admitting: Internal Medicine

## 2010-08-01 ENCOUNTER — Ambulatory Visit: Payer: Self-pay | Admitting: Internal Medicine

## 2010-08-17 ENCOUNTER — Ambulatory Visit: Payer: Self-pay | Admitting: Internal Medicine

## 2010-08-29 ENCOUNTER — Ambulatory Visit: Payer: Self-pay | Admitting: Internal Medicine

## 2010-09-19 ENCOUNTER — Telehealth (INDEPENDENT_AMBULATORY_CARE_PROVIDER_SITE_OTHER): Payer: Self-pay | Admitting: *Deleted

## 2010-09-20 ENCOUNTER — Ambulatory Visit: Payer: Self-pay | Admitting: Internal Medicine

## 2010-09-26 ENCOUNTER — Ambulatory Visit: Payer: Self-pay | Admitting: Internal Medicine

## 2010-10-09 ENCOUNTER — Ambulatory Visit: Payer: Self-pay | Admitting: Internal Medicine

## 2010-10-16 ENCOUNTER — Ambulatory Visit: Payer: Self-pay | Admitting: Internal Medicine

## 2010-10-24 ENCOUNTER — Ambulatory Visit: Payer: Self-pay | Admitting: Internal Medicine

## 2010-11-08 ENCOUNTER — Ambulatory Visit: Payer: Self-pay | Admitting: Internal Medicine

## 2010-12-01 ENCOUNTER — Encounter: Payer: Self-pay | Admitting: Internal Medicine

## 2010-12-02 ENCOUNTER — Ambulatory Visit: Payer: Self-pay | Admitting: Internal Medicine

## 2010-12-06 ENCOUNTER — Ambulatory Visit: Payer: Self-pay | Admitting: Internal Medicine

## 2010-12-08 ENCOUNTER — Ambulatory Visit: Payer: Self-pay | Admitting: Internal Medicine

## 2010-12-14 ENCOUNTER — Ambulatory Visit: Payer: Self-pay | Admitting: Internal Medicine

## 2010-12-16 ENCOUNTER — Encounter
Admission: RE | Admit: 2010-12-16 | Discharge: 2010-12-16 | Payer: Self-pay | Source: Home / Self Care | Attending: Internal Medicine | Admitting: Internal Medicine

## 2010-12-19 NOTE — Miscellaneous (Signed)
Summary: CPAP supplies/[facility]  CPAP supplies/[facility]   Imported By: Lester Willowbrook 05/19/2010 10:23:13  _____________________________________________________________________  External Attachment:    Type:   Image     Comment:   External Document

## 2010-12-19 NOTE — Miscellaneous (Signed)
Summary: Injection record/[facility]  Injection record/[facility]   Imported By: Lester Thornton 06/14/2010 09:31:59  _____________________________________________________________________  External Attachment:    Type:   Image     Comment:   External Document

## 2010-12-19 NOTE — Progress Notes (Signed)
Summary: wheezing, prod cough - OV scheduled  Phone Note Call from Patient Call back at Home Phone [phone]   Caller: Patient Call For: young Summary of Call: Pt c/o wheezing, coughing x10 days, suspects she may have bronchitis, wants to be seen pls advise.//[facility],[place] Initial call taken by: Darletta Moll,  September 19, 2010 4:12 PM  Follow-up for Phone Call        Per Florentina Addison, ok for pt to come in tomorrow at 3:15 -- place in 3pm ALT slot.   Called, spoke with pt. She c/o wheezing and prod cough with yellow mucus x 10 days.  OV scheduled with CY for tomorrow at 3:15pm -- pt aware.  Follow-up by: Gweneth Dimitri RN,  September 19, 2010 4:20 PM

## 2010-12-19 NOTE — Miscellaneous (Signed)
Summary: Injection Record / [facility]    Injection Record / [facility]    Imported By: Lennie Odor 07/21/2010 09:56:48  _____________________________________________________________________  External Attachment:    Type:   Image     Comment:   External Document

## 2010-12-19 NOTE — Assessment & Plan Note (Signed)
Summary: 6 MONTH RETURN/[facility]   Primary Provider/Referring Provider:  [name]  CC:  Pt here for 6 month follow up. Pt states is using CPAP machine every night approx 7 to 8 hours.  History of Present Illness:  01/10/09- OSA, allergic rhinitis,  bronchitis Accupuncture for back pain. Frontal headaches right greater than left with some dizziness/ light headed x several months. Eye doctor checked. Sleep apnea- can't sleep without cpap- uses autotitration from [place]. Continues allergy vaccine- GH 1:10.Says it has done more good than antibiotics. She has been out of her steroid nasal spray a long time.  07/11/09- OSA, allergic rhinitis, bronchitis Had leukpenia with some reponse to steroids, elevated sed rate. Questioned whether related to med. Elevated pANCA. CXR said to show mild cardiomegaly- cardiology appointment pending. Feels dyspneic expecially walking. Had some cough 3 weeks ago with sore throat, now resolved. Otherwise not wheeze or phlegm. Does get chest pains.   January 10, 2010- OSA, allergic rhinitis, bronchitis Had flu vax. No respiratory problems this winter. Had bad bladder infection, and had fallen down some stairs last Fall, but those are resolved. Says she lost last printed scripts.  Allergy- Continues allergy vaccine without problems. Had run out of allegra and flonase so has noted more rhinorhea and sneeze. Not wheezy in recent years- allergy vaccine helped.  Sleeping well, compliant with her autotitration CPAP.  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   Current Medications (verified): 1)  Byetta 5 Mcg Pen 5 Mcg/0.67ml Soln (Exenatide) .... Three Times A Day 2)  Cartia Xt 180 Mg  Cp24 (Diltiazem Hcl Coated Beads) .... Take 1 Tablet By Mouth Once A Day 3)  Metoprolol Tartrate 50 Mg  Tabs (Metoprolol Tartrate) .... Take 1 Tablet By Mouth Two Times A Day 4)  Isosorbide Mononitrate Cr 60 Mg  Tb24 (Isosorbide  Mononitrate) .... Take 1 Tablet By Mouth Once A Day 5)  Klor-Con 20 Meq  Pack (Potassium Chloride) .... Take 1 Tablet By Mouth Two Times A Day 6)  Bayer Aspirin Ec Low Dose 81 Mg  Tbec (Aspirin) .... Take 1 Tablet By Mouth Once A Day 7)  Zetia 10 Mg  Tabs (Ezetimibe) .... Take 1 Tablet By Mouth Once A Day 8)  Prevacid 30 Mg  Cpdr (Lansoprazole) .... Take 1 Tablet By Mouth Two Times A Day 9)  Allegra 60 Mg  Tabs (Fexofenadine Hcl) .... Take 1 Tablet By Mouth Two Times A Day 10)  Flonase 50 Mcg/act  Susp (Fluticasone Propionate) .... As Directed 11)  Arthrotec 75 75-200 Mg-Mcg Tabs (Diclofenac-Misoprostol) .... Take 1 Tablet By Mouth Two Times A Day 12)  Wellbutrin Xl 300 Mg  Tb24 (Bupropion Hcl) .... Take 1 Tablet By Mouth Once A Day 13)  Effexor Xr 75 Mg Xr24h-Cap (Venlafaxine Hcl) .... Take 1 Tablet By Mouth Once A Day 14)  Provigil 200 Mg  Tabs (Modafinil) .... As Needed 15)  Nitro Sublingual Spray .... As Directed 16)  Allergy Vaccine  1:10 Gh .... Once Weekly 17)  Diovan Hct 320-25 Mg  Tabs (Valsartan-Hydrochlorothiazide) .... Take 1 By Mouth Once Daily 18)  C:pap Auto Titration [facility] 19)  Methylprednisolone 4 Mg Tabs (Methylprednisolone) .... 2 Tabs Once Daily 20)  Tramadol-Acetaminophen 37.5-325 Mg Tabs (Tramadol-Acetaminophen) .Marland Kitchen.. 1 Tab 4 Times Daily As Needed 21)  Clonidine .... Take 2 Tab By Mouth At Bedtime  Allergies (verified): 1)  ! Ceftin 2)  ! Celebrex 3)  ! Sulfa 4)  !  Codeine 5)  ! Zithromax 6)  ! Hydralazine Hcl  Past History:  Past Medical History: Last updated: 06/28/2008 SLEEP APNEA (ICD-780.57) DIABETES, TYPE 2 (ICD-250.00) DIABETES MELLITUS (ICD-250.00) ALLERGIC RHINITIS (ICD-477.9) DEPRESSION (ICD-311) ANXIETY (ICD-300.00) HYPERTENSION (ICD-401.9) CHF, MILD (ICD-428.0) SOMNOLENCE (ICD-780.09) OBSTRUCTIVE SLEEP APNEA (ICD-327.23) BRONCHITIS (ICD-490)    Past Surgical History: Last updated: 12/29/2007 oral surgery. tonsils carpal  tunnel hysterectomy appendectomy arthrosvcopy knee  Family History: Last updated: 06/28/2008 great grandmother had diabetes  mother died at 60 from emphysema, also had arthritis, poly myalgia, rheumatism, uterin, cervical and vaginal cancer  father died at 38 from heart attack, also had hx of stroke, arthritis, and sinus infections  2 siblings, of which   Social History: Last updated: 06/28/2008 Patient never smoked.  positive for second-hand smoke exposure does not exercise 5 cups caffeine a day married for 44 years 2 children  Risk Factors: Smoking Status: never (12/29/2007)  Review of Systems      See HPI  The patient denies anorexia, fever, weight loss, weight gain, vision loss, decreased hearing, hoarseness, chest pain, syncope, dyspnea on exertion, peripheral edema, prolonged cough, headaches, hemoptysis, and severe indigestion/heartburn.    Vital Signs:  Patient profile:   65 year old female Height:      65 inches Weight:      256.50 pounds O2 Sat:      98 % on Room air Pulse rate:   47 / minute BP sitting:   152 / 78  (left arm) Cuff size:   large  Vitals Entered By: Zackery Barefoot CMA (January 10, 2010 11:20 AM)  O2 Flow:  Room air CC: Pt here for 6 month follow up. Pt states is using CPAP machine every night approx 7 to 8 hours Comments Medications reviewed with patient Verified contact number and pharmacy with patient Zackery Barefoot CMA  January 10, 2010 11:21 AM    Physical Exam  Additional Exam:  General: A/Ox3; pleasant and cooperative, NAD, obese, cheerful, fully awake SKIN: no rash, lesions NODES: no lymphadenopathy HEENT: [place]/AT, EOM- WNL, Conjuctivae- clear, PERRLA, TM-WNL, Nose- mucus with crusitng, Throat- clear and wnl, Mallampati  II-III NECK: Supple w/ fair ROM, JVD- none, normal carotid impulses w/o bruits Thyroid- normal to palpation, dowagers hump CHEST: Clear to P&A HEART: RRR, no m/g/r heard ABDOMEN: Soft and nl; nml bowel  sounds; no organomegaly or masses noted WJX:BJYN, nl pulses, no edema, prominent blue veins in upper extremities NEURO: Grossly intact to observation      Impression & Recommendations:  Problem # 1:  SLEEP APNEA (ICD-780.57)  Good compliance and control with her autotiration cpap.  Weight loss would help.  Problem # 2:  SOMNOLENCE (ICD-780.09)  She continues Provigil if needed, as for long driving, but not needed every day. Takes naps if she falls asleep in chair.  Problem # 3:  ALLERGIC RHINITIS (ICD-477.9)  She continues allergy vaccine, but will need to refill her flonase and allegra ahead of the Spring season. Her updated medication list for this problem includes:    Allegra 60 Mg Tabs (Fexofenadine hcl) .Marland Kitchen... Take 1 tablet by mouth two times a day    Flonase 50 Mcg/act Susp (Fluticasone propionate) .Marland Kitchen... As directed  Orders: Est. Patient Level III (82956)  Medications Added to Medication List This Visit: 1)  Byetta 5 Mcg Pen 5 Mcg/0.53ml Soln (Exenatide) .... Three times a day 2)  Cartia Xt 180 Mg Cp24 (Diltiazem hcl coated beads) .... Take 1 tablet by mouth once a day 3)  Prevacid 30 Mg Cpdr (Lansoprazole) .... Take 1 tablet by mouth two times a day 4)  Arthrotec 75 75-200 Mg-mcg Tabs (Diclofenac-misoprostol) .... Take 1 tablet by mouth two times a day 5)  Effexor Xr 75 Mg Xr24h-cap (Venlafaxine hcl) .... Take 1 tablet by mouth once a day 6)  Clonidine  .... Take 2 tab by mouth at bedtime  Patient Instructions: 1)  Please schedule a follow-up appointment in 6 months. 2)  Refill scripts for flonase and allegra 3)  Continue Provigil and CPAP 4)  Continue allergy vaccine Prescriptions: FLONASE 50 MCG/ACT  SUSP (FLUTICASONE PROPIONATE) as directed  #3 x 3   Entered and Authorized by:   Waymon Budge MD   Signed by:   Waymon Budge MD on 01/10/2010   Method used:   Print then Give to Patient   RxID:   [id number] ALLEGRA 60 MG  TABS (FEXOFENADINE HCL) Take 1  tablet by mouth two times a day  #360 x 3   Entered and Authorized by:   Waymon Budge MD   Signed by:   Waymon Budge MD on 01/10/2010   Method used:   Print then Give to Patient   RxID:   [id number]    Immunization History:  Influenza Immunization History:    Influenza:  historical (09/18/2009)

## 2010-12-19 NOTE — Assessment & Plan Note (Signed)
Summary: wheezing, cough -- appt at 3:15 / cj   Primary [name] [name]/Referring [name]:  [name]  CC:  Acute Visit pt. c/o sore throat , prod cough yellow, and increase sob and wheezing x 11 days.  History of Present Illness: January 10, 2010- OSA, allergic rhinitis, bronchitis Had flu vax. No respiratory problems this winter. Had bad bladder infection, and had fallen down some stairs last Fall, but those are resolved. Says she lost last printed scripts.  Allergy- Continues allergy vaccine without problems. Had run out of allegra and flonase so has noted more rhinorhea and sneeze. Not wheezy in recent years- allergy vaccine helped.  Sleeping well, compliant with her autotitration CPAP.  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  July 10, 2010- OSA, allergic rhinitis, bronchitis Had MRI and chemical stress test for evaluation of hypertiension, but says she is much better now. CPAP continues every night set on AutoPAP. She can't sleep without it. She feels this is well managed and she uses Provigil only occasionally. 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 Weight not changing much. She says allergy vaccine is doing "great".  September 20, 2010-  OSA, allergic rhinitis, bronchitis Nurse-CC: Acute Visit pt. c/o sore throat , prod cough yellow, increase sob and wheezing x 11 days Had been sitting outside at grandchild's ballgames. Yellow from nose and chest. Began with low grade fever and sore throat. Frontal headache.    Preventive Screening-Counseling & Management  Alcohol-Tobacco     Smoking Status: never  Current Medications (verified): 1)  Byetta 5 Mcg Pen 5 Mcg/0.52ml Soln (Exenatide) .... Three Times A Day 2)  Metoprolol Tartrate 50 Mg  Tabs (Metoprolol Tartrate) .... Take 1 Tablet By Mouth Two Times A Day 3)  Isosorbide Mononitrate Cr 60 Mg  Tb24 (Isosorbide Mononitrate) .... Take 1 Tablet By  Mouth Once A Day 4)  Klor-Con 20 Meq  Pack (Potassium Chloride) .... Take 1 Tablet By Mouth Two Times A Day 5)  Bayer Aspirin Ec Low Dose 81 Mg  Tbec (Aspirin) .... Take 1 Tablet By Mouth Once A Day 6)  Zetia 10 Mg  Tabs (Ezetimibe) .... Take 1 Tablet By Mouth Once A Day 7)  Prevacid 30 Mg  Cpdr (Lansoprazole) .... Take 1 Tablet By Mouth Two Times A Day 8)  Allegra 60 Mg  Tabs (Fexofenadine Hcl) .... Take 1 Tablet By Mouth Two Times A Day 9)  Flonase 50 Mcg/act  Susp (Fluticasone Propionate) .... As Directed 10)  Arthrotec 75 75-200 Mg-Mcg Tabs (Diclofenac-Misoprostol) .... Take 1 Tablet By Mouth Two Times A Day 11)  Wellbutrin Xl 300 Mg  Tb24 (Bupropion Hcl) .... Take 1 Tablet By Mouth Once A Day 12)  Effexor Xr 37.5 Mg Xr24h-Cap (Venlafaxine Hcl) .... Take 1 By Mouth Once Daily 13)  Provigil 200 Mg  Tabs (Modafinil) .... As Needed 14)  Nitro Sublingual Spray .... As Directed 15)  Allergy Vaccine  1:10 Gh .... Once Weekly 16)  Diovan Hct 320-25 Mg  Tabs (Valsartan-Hydrochlorothiazide) .... Take 1 By Mouth Once Daily 17)  C:pap Auto Titration [facility] 18)  Methylprednisolone 4 Mg Tabs (Methylprednisolone) .... Take 1 By Mouth Once Daily 19)  Tramadol-Acetaminophen 37.5-325 Mg Tabs (Tramadol-Acetaminophen) .Marland Kitchen.. 1 Tab 4 Times Daily As Needed 20)  Clonidine .... Take 2 Tab By Mouth At Bedtime 21)  Norvasc 10 Mg Tabs (Amlodipine Besylate) .... Take 1 By Mouth Once Daily 22)  Ativan 0.5 Mg  Tabs (Lorazepam) .... Take 1 By Mouth Two Times A Day As Needed  Allergies (verified): 1)  ! Ceftin 2)  ! Celebrex 3)  ! Sulfa 4)  ! Codeine 5)  ! Zithromax 6)  ! Hydralazine Hcl  Past History:  Past Medical History: Last updated: 06/28/2008 SLEEP APNEA (ICD-780.57) DIABETES, TYPE 2 (ICD-250.00) DIABETES MELLITUS (ICD-250.00) ALLERGIC RHINITIS (ICD-477.9) DEPRESSION (ICD-311) ANXIETY (ICD-300.00) HYPERTENSION (ICD-401.9) CHF, MILD (ICD-428.0) SOMNOLENCE (ICD-780.09) OBSTRUCTIVE SLEEP APNEA  (ICD-327.23) BRONCHITIS (ICD-490)    Past Surgical History: Last updated: 12/29/2007 oral surgery. tonsils carpal tunnel hysterectomy appendectomy arthrosvcopy knee  Family History: Last updated: 06/28/2008 great grandmother had diabetes  mother died at 38 from emphysema, also had arthritis, poly myalgia, rheumatism, uterin, cervical and vaginal cancer  father died at 70 from heart attack, also had hx of stroke, arthritis, and sinus infections  2 siblings, of which   Social History: Last updated: 06/28/2008 Patient never smoked.  positive for second-hand smoke exposure does not exercise 5 cups caffeine a day married for 44 years 2 children  Risk Factors: Smoking Status: never (09/20/2010)  Review of Systems      See HPI       The patient complains of shortness of breath with activity, productive cough, nasal congestion/difficulty breathing through nose, and change in color of mucus.  The patient denies shortness of breath at rest, non-productive cough, coughing up blood, chest pain, irregular heartbeats, acid heartburn, indigestion, loss of appetite, weight change, abdominal pain, difficulty swallowing, sore throat, tooth/dental problems, headaches, sneezing, itching, ear ache, hand/feet swelling, rash, and fever.    Vital Signs:  Patient profile:   65 year old female Height:      65 inches Weight:      241.4 pounds BMI:     40.32 O2 Sat:      96 % on Room air Pulse rate:   61 / minute BP sitting:   140 / 72  (left arm) Cuff size:   large  Vitals Entered By: Renold Genta RCP, LPN (September 20, 2010 3:39 PM)  O2 Flow:  Room air CC: Acute Visit pt. c/o sore throat , prod cough yellow, increase sob and wheezing x 11 days Comments Medications reviewed with patient Renold Genta RCP, LPN  September 20, 2010 3:41 PM    Physical Exam  Additional Exam:  General: A/Ox3; pleasant and cooperative, NAD, obese, cheerful, fully awake SKIN: no rash, lesions NODES:  no lymphadenopathy HEENT: [place]/AT, EOM- WNL, Conjuctivae- clear, PERRLA, TM-WNL, Nose- mucus with crusitng, Throat- clear and wnl, Mallampati  II-III NECK: Supple w/ fair ROM, JVD- none, normal carotid impulses w/o bruits Thyroid- normal to palpation, dowagers hump CHEST: coarse wheeze and cough HEART: RRR, no m/g/r heard ABDOMEN: Soft, overweight OZH:YQMV, nl pulses, no edema, prominent blue veins in upper extremities NEURO: Grossly intact to observation      Impression & Recommendations:  Problem # 1:  BRONCHITIS (ICD-490)  Acute rhinosiusitis and bronchitis. OTC cough syrup not helping;.  We discussed steroids and her diabetes. Wil give neb, depo and antibiotic with cough syrup.  Her updated medication list for this problem includes:    Hydrocodone-homatropine 5-1.5 Mg/65ml Syrp (Hydrocodone-homatropine) .Marland Kitchen... 1 teaspoon four times a day as needed cough    Doxycycline Hyclate 100 Mg Caps (Doxycycline hyclate) .Marland Kitchen... 2 today then one daily  Problem # 2:  SLEEP APNEA (ICD-780.57) She remains very compliant and doesn't think that CPAP has bothered her sinuses.  Medications Added to Medication List This Visit:  1)  Hydrocodone-homatropine 5-1.5 Mg/88ml Syrp (Hydrocodone-homatropine) .Marland Kitchen.. 1 teaspoon four times a day as needed cough 2)  Doxycycline Hyclate 100 Mg Caps (Doxycycline hyclate) .... 2 today then one daily  Other Orders: Admin of Therapeutic Inj  intramuscular or subcutaneous (69629) Depo- Medrol 80mg  (J1040) Nebulizer Tx (52841) Est. Patient Level IV (32440)  Patient Instructions: 1)  Keep scheduled appointment or earlier as needed 2)  neb xop 1.25 3)  depo 80 4)  script for hydromet cough syrup 5)  script for doxycycline antibiotic.  Prescriptions: DOXYCYCLINE HYCLATE 100 MG CAPS (DOXYCYCLINE HYCLATE) 2 today then one daily  #8 x 0   Entered and Authorized by:   Waymon Budge MD   Signed by:   Waymon Budge MD on 09/20/2010   Method used:   Print then Give to  Patient   RxID:   [id number] HYDROCODONE-HOMATROPINE 5-1.5 MG/5ML SYRP (HYDROCODONE-HOMATROPINE) 1 teaspoon four times a day as needed cough  #200 ml x 0   Entered and Authorized by:   Waymon Budge MD   Signed by:   Waymon Budge MD on 09/20/2010   Method used:   Print then Give to Patient   RxID:   [id number]    Medication Administration  Injection # 1:    Medication: Depo- Medrol 80mg     Diagnosis: BRONCHITIS (ICD-490)    Route: SQ    Site: RUOQ gluteus    Exp Date: 04/2013    Lot #: [id number]    Mfr: Pharmacia    Patient tolerated injection without complications    Given by: Reynaldo Minium CMA (September 20, 2010 4:26 PM)  Medication # 1:    Medication: Xopenex 1.25mg     Diagnosis: BRONCHITIS (ICD-490)    Dose: 1 vial    Route: inhaled    Exp Date: 06/2011    Lot #: [id number]    Mfr: Sepracor    Patient tolerated medication without complications    Given by: Reynaldo Minium CMA (September 20, 2010 4:27 PM)  Orders Added: 1)  Admin of Therapeutic Inj  intramuscular or subcutaneous [96372] 2)  Depo- Medrol 80mg  [J1040] 3)  Nebulizer Tx [06301] 4)  Est. Patient Level IV [60109]

## 2010-12-19 NOTE — Miscellaneous (Signed)
Summary: Injection record/[facility]  Injection record/ [facility]   Imported By: Sherian Rein 04/11/2010 12:51:30  _____________________________________________________________________  External Attachment:    Type:   Image     Comment:   External Document

## 2010-12-19 NOTE — Assessment & Plan Note (Signed)
Summary: rov 6 months///kp   Primary Provider/Referring Provider:  [name]  CC:  6 month follow up visit-allergies..  History of Present Illness: 07/11/09- OSA, allergic rhinitis, bronchitis Had leukpenia with some reponse to steroids, elevated sed rate. Questioned whether related to med. Elevated pANCA. CXR said to show mild cardiomegaly- cardiology appointment pending. Feels dyspneic expecially walking. Had some cough 3 weeks ago with sore throat, now resolved. Otherwise not wheeze or phlegm. Does get chest pains.   January 10, 2010- OSA, allergic rhinitis, bronchitis Had flu vax. No respiratory problems this winter. Had bad bladder infection, and had fallen down some stairs last Fall, but those are resolved. Says she lost last printed scripts.  Allergy- Continues allergy vaccine without problems. Had run out of allegra and flonase so has noted more rhinorhea and sneeze. Not wheezy in recent years- allergy vaccine helped.  Sleeping well, compliant with her autotitration CPAP.  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  July 10, 2010- OSA, allergic rhinitis, bronchitis Had MRI and chemical stress test for evaluation of hypertiension, but says she is much better now. CPAP continues every night set on AutoPAP. She can't sleep without it. She feels this is well managed and she uses Provigil only occasionally. 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 Weight not changing much. She says allergy vaccine is doing "great".        Asthma History    Initial Asthma Severity Rating:    Age range: 12+ years    Symptoms: 0-2 days/week    Nighttime Awakenings: 0-2/month    Interferes w/ normal activity: no limitations    SABA use (not for EIB): 0-2 days/week    Asthma Severity Assessment: Intermittent   Preventive Screening-Counseling & Management  Alcohol-Tobacco     Smoking Status: never  Current  Medications (verified): 1)  Byetta 5 Mcg Pen 5 Mcg/0.55ml Soln (Exenatide) .... Three Times A Day 2)  Metoprolol Tartrate 50 Mg  Tabs (Metoprolol Tartrate) .... Take 1 Tablet By Mouth Two Times A Day 3)  Isosorbide Mononitrate Cr 60 Mg  Tb24 (Isosorbide Mononitrate) .... Take 1 Tablet By Mouth Once A Day 4)  Klor-Con 20 Meq  Pack (Potassium Chloride) .... Take 1 Tablet By Mouth Two Times A Day 5)  Bayer Aspirin Ec Low Dose 81 Mg  Tbec (Aspirin) .... Take 1 Tablet By Mouth Once A Day 6)  Zetia 10 Mg  Tabs (Ezetimibe) .... Take 1 Tablet By Mouth Once A Day 7)  Prevacid 30 Mg  Cpdr (Lansoprazole) .... Take 1 Tablet By Mouth Two Times A Day 8)  Allegra 60 Mg  Tabs (Fexofenadine Hcl) .... Take 1 Tablet By Mouth Two Times A Day 9)  Flonase 50 Mcg/act  Susp (Fluticasone Propionate) .... As Directed 10)  Arthrotec 75 75-200 Mg-Mcg Tabs (Diclofenac-Misoprostol) .... Take 1 Tablet By Mouth Two Times A Day 11)  Wellbutrin Xl 300 Mg  Tb24 (Bupropion Hcl) .... Take 1 Tablet By Mouth Once A Day 12)  Effexor Xr 37.5 Mg Xr24h-Cap (Venlafaxine Hcl) .... Take 1 By Mouth Once Daily 13)  Provigil 200 Mg  Tabs (Modafinil) .... As Needed 14)  Nitro Sublingual Spray .... As Directed 15)  Allergy Vaccine  1:10 Gh .... Once Weekly 16)  Diovan Hct 320-25 Mg  Tabs (Valsartan-Hydrochlorothiazide) .... Take 1 By Mouth Once Daily 17)  C:pap Auto Titration [employer] 18)  Methylprednisolone 4 Mg Tabs (Methylprednisolone) .... Take 1 By  Mouth Once Daily 19)  Tramadol-Acetaminophen 37.5-325 Mg Tabs (Tramadol-Acetaminophen) .Marland Kitchen.. 1 Tab 4 Times Daily As Needed 20)  Clonidine .... Take 2 Tab By Mouth At Bedtime 21)  Norvasc 10 Mg Tabs (Amlodipine Besylate) .... Take 1 By Mouth Once Daily 22)  Ativan 0.5 Mg Tabs (Lorazepam) .... Take 1 By Mouth Two Times A Day As Needed  Allergies (verified): 1)  ! Ceftin 2)  ! Celebrex 3)  ! Sulfa 4)  ! Codeine 5)  ! Zithromax 6)  ! Hydralazine Hcl  Past History:  Past Medical  History: Last updated: 06/28/2008 SLEEP APNEA (ICD-780.57) DIABETES, TYPE 2 (ICD-250.00) DIABETES MELLITUS (ICD-250.00) ALLERGIC RHINITIS (ICD-477.9) DEPRESSION (ICD-311) ANXIETY (ICD-300.00) HYPERTENSION (ICD-401.9) CHF, MILD (ICD-428.0) SOMNOLENCE (ICD-780.09) OBSTRUCTIVE SLEEP APNEA (ICD-327.23) BRONCHITIS (ICD-490)    Past Surgical History: Last updated: 12/29/2007 oral surgery. tonsils carpal tunnel hysterectomy appendectomy arthrosvcopy knee  Family History: Last updated: 06/28/2008 great grandmother had diabetes  mother died at 13 from emphysema, also had arthritis, poly myalgia, rheumatism, uterin, cervical and vaginal cancer  father died at 26 from heart attack, also had hx of stroke, arthritis, and sinus infections  2 siblings, of which   Social History: Last updated: 06/28/2008 Patient never smoked.  positive for second-hand smoke exposure does not exercise 5 cups caffeine a day married for 44 years 2 children  Risk Factors: Smoking Status: never (07/10/2010)  Review of Systems      See HPI  The patient denies shortness of breath with activity, shortness of breath at rest, productive cough, non-productive cough, coughing up blood, chest pain, irregular heartbeats, acid heartburn, indigestion, loss of appetite, weight change, abdominal pain, difficulty swallowing, sore throat, tooth/dental problems, headaches, nasal congestion/difficulty breathing through nose, and sneezing.    Vital Signs:  Patient profile:   65 year old female Height:      65 inches Weight:      247 pounds BMI:     41.25 O2 Sat:      96 % on Room air Pulse rate:   52 / minute BP sitting:   150 / 60  (left arm) Cuff size:   large  Vitals Entered By: Reynaldo Minium CMA (July 10, 2010 10:36 AM)  O2 Flow:  Room air CC: 6 month follow up visit-allergies.   Physical Exam  Additional Exam:  General: A/Ox3; pleasant and cooperative, NAD, obese, cheerful, fully awake SKIN: no  rash, lesions NODES: no lymphadenopathy HEENT: [place]/AT, EOM- WNL, Conjuctivae- clear, PERRLA, TM-WNL, Nose- mucus with crusitng, Throat- clear and wnl, Mallampati  II-III NECK: Supple w/ fair ROM, JVD- none, normal carotid impulses w/o bruits Thyroid- normal to palpation, dowagers hump CHEST: Clear to P&A HEART: RRR, no m/g/r heard ABDOMEN: Soft, overweight ZOX:WRUE, nl pulses, no edema, prominent blue veins in upper extremities NEURO: Grossly intact to observation      Impression & Recommendations:  Problem # 1:  SLEEP APNEA (ICD-780.57)  Good cpap compliance and control. She has tolerated AutoPAP much better than CPAP. Sleep hygiene is pretty good.  Problem # 2:  SOMNOLENCE (ICD-780.09)  Residual hypersomnolence is managed with Provigil. We can reassess for primary somnolence if needed.  Problem # 3:  ALLERGIC RHINITIS (ICD-477.9)  She is well controlled. We will continue allergy vaccine as discused. Her updated medication list for this problem includes:    Allegra 60 Mg Tabs (Fexofenadine hcl) .Marland Kitchen... Take 1 tablet by mouth two times a day    Flonase 50 Mcg/act Susp (Fluticasone propionate) .Marland Kitchen... As directed  Orders: Est.  Patient Level IV (16109)  Medications Added to Medication List This Visit: 1)  Effexor Xr 37.5 Mg Xr24h-cap (Venlafaxine hcl) .... Take 1 by mouth once daily 2)  Methylprednisolone 4 Mg Tabs (Methylprednisolone) .... Take 1 by mouth once daily 3)  Norvasc 10 Mg Tabs (Amlodipine besylate) .... Take 1 by mouth once daily 4)  Ativan 0.5 Mg Tabs (Lorazepam) .... Take 1 by mouth two times a day as needed  Patient Instructions: 1)  Please schedule a follow-up appointment in 1 year. 2)  Call sooner if you need me.

## 2010-12-25 DIAGNOSIS — J301 Allergic rhinitis due to pollen: Secondary | ICD-10-CM

## 2011-01-10 NOTE — Miscellaneous (Signed)
Summary: Injection Financial risk analyst   Imported By: Sherian Rein 01/04/2011 08:27:12  _____________________________________________________________________  External Attachment:    Type:   Image     Comment:   External Document

## 2011-01-23 ENCOUNTER — Encounter: Payer: Self-pay | Admitting: Internal Medicine

## 2011-01-23 ENCOUNTER — Ambulatory Visit (INDEPENDENT_AMBULATORY_CARE_PROVIDER_SITE_OTHER): Payer: Medicare Other

## 2011-01-23 DIAGNOSIS — J302 Other seasonal allergic rhinitis: Secondary | ICD-10-CM

## 2011-01-23 DIAGNOSIS — J301 Allergic rhinitis due to pollen: Secondary | ICD-10-CM

## 2011-01-23 DIAGNOSIS — J3089 Other allergic rhinitis: Secondary | ICD-10-CM | POA: Insufficient documentation

## 2011-01-23 HISTORY — DX: Other seasonal allergic rhinitis: J30.2

## 2011-01-30 ENCOUNTER — Ambulatory Visit (INDEPENDENT_AMBULATORY_CARE_PROVIDER_SITE_OTHER): Payer: Medicare Other

## 2011-01-30 ENCOUNTER — Encounter: Payer: Self-pay | Admitting: Internal Medicine

## 2011-01-30 DIAGNOSIS — J301 Allergic rhinitis due to pollen: Secondary | ICD-10-CM

## 2011-01-30 NOTE — Assessment & Plan Note (Signed)
Summary: ALLERGY/CB  Nurse Visit   Allergies: 1)  ! Ceftin 2)  ! Celebrex 3)  ! Sulfa 4)  ! Codeine 5)  ! Zithromax 6)  ! Hydralazine Hcl  Orders Added: 1)  Allergy Injection (1) [16606]

## 2011-02-06 NOTE — Assessment & Plan Note (Signed)
Summary: allergy/cb  Nurse Visit   Allergies: 1)  ! Ceftin 2)  ! Celebrex 3)  ! Sulfa 4)  ! Codeine 5)  ! Zithromax 6)  ! Hydralazine Hcl  Orders Added: 1)  Allergy Injection (1) [16109]

## 2011-02-12 ENCOUNTER — Ambulatory Visit (INDEPENDENT_AMBULATORY_CARE_PROVIDER_SITE_OTHER): Payer: Medicare Other

## 2011-02-12 DIAGNOSIS — J301 Allergic rhinitis due to pollen: Secondary | ICD-10-CM

## 2011-02-21 ENCOUNTER — Ambulatory Visit (INDEPENDENT_AMBULATORY_CARE_PROVIDER_SITE_OTHER): Payer: Medicare Other

## 2011-02-21 DIAGNOSIS — J301 Allergic rhinitis due to pollen: Secondary | ICD-10-CM

## 2011-03-07 ENCOUNTER — Ambulatory Visit (INDEPENDENT_AMBULATORY_CARE_PROVIDER_SITE_OTHER): Payer: Medicare Other

## 2011-03-07 DIAGNOSIS — J309 Allergic rhinitis, unspecified: Secondary | ICD-10-CM

## 2011-03-14 ENCOUNTER — Emergency Department (HOSPITAL_COMMUNITY): Payer: Medicare Other

## 2011-03-14 ENCOUNTER — Emergency Department (HOSPITAL_COMMUNITY)
Admission: EM | Admit: 2011-03-14 | Discharge: 2011-03-14 | Disposition: A | Discharge status: Home / Self Care | Payer: Medicare Other | Attending: Emergency Medicine | Admitting: Emergency Medicine

## 2011-03-14 DIAGNOSIS — S0003XA Contusion of scalp, initial encounter: Secondary | ICD-10-CM | POA: Insufficient documentation

## 2011-03-14 DIAGNOSIS — E119 Type 2 diabetes mellitus without complications: Secondary | ICD-10-CM | POA: Insufficient documentation

## 2011-03-14 DIAGNOSIS — W1809XA Striking against other object with subsequent fall, initial encounter: Secondary | ICD-10-CM | POA: Insufficient documentation

## 2011-03-14 DIAGNOSIS — R51 Headache: Secondary | ICD-10-CM | POA: Insufficient documentation

## 2011-03-14 DIAGNOSIS — I10 Essential (primary) hypertension: Secondary | ICD-10-CM | POA: Insufficient documentation

## 2011-03-14 DIAGNOSIS — IMO0001 Reserved for inherently not codable concepts without codable children: Secondary | ICD-10-CM | POA: Insufficient documentation

## 2011-03-14 DIAGNOSIS — F341 Dysthymic disorder: Secondary | ICD-10-CM | POA: Insufficient documentation

## 2011-03-14 DIAGNOSIS — R220 Localized swelling, mass and lump, head: Secondary | ICD-10-CM | POA: Insufficient documentation

## 2011-03-14 DIAGNOSIS — Y9289 Other specified places as the place of occurrence of the external cause: Secondary | ICD-10-CM | POA: Insufficient documentation

## 2011-03-14 DIAGNOSIS — S0990XA Unspecified injury of head, initial encounter: Secondary | ICD-10-CM | POA: Insufficient documentation

## 2011-03-14 DIAGNOSIS — I252 Old myocardial infarction: Secondary | ICD-10-CM | POA: Insufficient documentation

## 2011-03-14 DIAGNOSIS — S0083XA Contusion of other part of head, initial encounter: Secondary | ICD-10-CM | POA: Insufficient documentation

## 2011-03-22 ENCOUNTER — Other Ambulatory Visit: Payer: Self-pay | Admitting: Neurosurgery

## 2011-03-22 DIAGNOSIS — M479 Spondylosis, unspecified: Secondary | ICD-10-CM

## 2011-03-24 ENCOUNTER — Ambulatory Visit
Admission: RE | Admit: 2011-03-24 | Discharge: 2011-03-24 | Disposition: A | Discharge status: Home / Self Care | Payer: Medicare Other | Source: Ambulatory Visit | Attending: Neurosurgery | Admitting: Neurosurgery

## 2011-03-24 DIAGNOSIS — M479 Spondylosis, unspecified: Secondary | ICD-10-CM

## 2011-04-03 ENCOUNTER — Other Ambulatory Visit (HOSPITAL_COMMUNITY): Payer: Self-pay | Admitting: Neurosurgery

## 2011-04-03 ENCOUNTER — Telehealth: Payer: Self-pay | Admitting: Internal Medicine

## 2011-04-03 ENCOUNTER — Ambulatory Visit (HOSPITAL_COMMUNITY)
Admission: RE | Admit: 2011-04-03 | Discharge: 2011-04-03 | Disposition: A | Discharge status: Home / Self Care | Payer: Medicare Other | Source: Ambulatory Visit | Attending: Neurosurgery | Admitting: Neurosurgery

## 2011-04-03 ENCOUNTER — Encounter (HOSPITAL_COMMUNITY)
Admission: RE | Admit: 2011-04-03 | Discharge: 2011-04-03 | Disposition: A | Discharge status: Home / Self Care | Payer: Medicare Other | Source: Ambulatory Visit | Attending: Neurosurgery | Admitting: Neurosurgery

## 2011-04-03 DIAGNOSIS — Z01818 Encounter for other preprocedural examination: Secondary | ICD-10-CM | POA: Insufficient documentation

## 2011-04-03 DIAGNOSIS — M48061 Spinal stenosis, lumbar region without neurogenic claudication: Secondary | ICD-10-CM | POA: Insufficient documentation

## 2011-04-03 DIAGNOSIS — I517 Cardiomegaly: Secondary | ICD-10-CM | POA: Insufficient documentation

## 2011-04-03 DIAGNOSIS — Z01810 Encounter for preprocedural cardiovascular examination: Secondary | ICD-10-CM | POA: Insufficient documentation

## 2011-04-03 DIAGNOSIS — Z01812 Encounter for preprocedural laboratory examination: Secondary | ICD-10-CM | POA: Insufficient documentation

## 2011-04-03 LAB — BASIC METABOLIC PANEL
BUN: 13 mg/dL (ref 6–23)
CO2: 29 mEq/L (ref 19–32)
Calcium: 9.3 mg/dL (ref 8.4–10.5)
Chloride: 99 mEq/L (ref 96–112)
Creatinine, Ser: 0.61 mg/dL (ref 0.4–1.2)
GFR calc Af Amer: >60 mL/min (ref >60)
GFR calc non Af Amer: >60 mL/min (ref >60)
Glucose, Bld: 154 mg/dL — ABNORMAL HIGH (ref 70–99)
Potassium: 3.4 mEq/L — ABNORMAL LOW (ref 3.5–5.1)
Sodium: 138 mEq/L (ref 135–145)

## 2011-04-03 LAB — SURGICAL PCR SCREEN
MRSA, PCR: NEGATIVE
Staphylococcus aureus: NEGATIVE

## 2011-04-03 LAB — URINE MICROSCOPIC-ADD ON

## 2011-04-03 LAB — CBC
HCT: 33.2 % — ABNORMAL LOW (ref 36.0–46.0)
Hemoglobin: 10.7 g/dL — ABNORMAL LOW (ref 12.0–15.0)
MCH: 28.5 pg (ref 26.0–34.0)
MCHC: 32.2 g/dL (ref 30.0–36.0)
MCV: 88.5 fL (ref 78.0–100.0)
Platelets: 127 10*3/uL — ABNORMAL LOW (ref 150–400)
RBC: 3.75 MIL/uL — ABNORMAL LOW (ref 3.87–5.11)
RDW: 16.1 % — ABNORMAL HIGH (ref 11.5–15.5)
WBC: 7.5 10*3/uL (ref 4.0–10.5)

## 2011-04-03 LAB — URINALYSIS, ROUTINE W REFLEX MICROSCOPIC
Bilirubin Urine: NEGATIVE
Glucose, UA: NEGATIVE mg/dL
Hgb urine dipstick: NEGATIVE
Ketones, ur: NEGATIVE mg/dL
Nitrite: NEGATIVE
Protein, ur: NEGATIVE mg/dL
Specific Gravity, Urine: 1.012 (ref 1.005–1.030)
Urobilinogen, UA: 0.2 mg/dL (ref 0.0–1.0)
pH: 7 (ref 5.0–8.0)

## 2011-04-03 LAB — DIFFERENTIAL
Basophils Absolute: 0 10*3/uL (ref 0.0–0.1)
Basophils Relative: 0 % (ref 0–1)
Eosinophils Absolute: 0.1 10*3/uL (ref 0.0–0.7)
Eosinophils Relative: 1 % (ref 0–5)
Lymphocytes Relative: 20 % (ref 12–46)
Lymphs Abs: 1.5 10*3/uL (ref 0.7–4.0)
Monocytes Absolute: 0.8 10*3/uL (ref 0.1–1.0)
Monocytes Relative: 11 % (ref 3–12)
Neutro Abs: 5.1 10*3/uL (ref 1.7–7.7)
Neutrophils Relative %: 68 % (ref 43–77)

## 2011-04-03 NOTE — Assessment & Plan Note (Signed)
[facility]                             PULMONARY OFFICE NOTE   NAME:[name], [name]                         MRN:          [id number]  DATE:06/30/2007                            DOB:          Mar 15, 1946    PROBLEM:  1. Bronchitis.  2. Obstructive sleep apnea/auto titration CPAP.  3. Excessive daytime somnolence/Provigil.  4. Mild congestive heart failure.  5. Hypertension.  6. Anxiety and depression.  7. Allergic rhinitis.  8. Diabetes.   HISTORY:  She is working with a nutritionist through her diabetes  management and has been able to lose some weight. She continues auto  titration CPAP every night. She will use Provigil mainly if she has a  long drive and allows herself to drift off in her chair napping whenever  she feels like it. I suspect sleep hygiene is still somewhat irregular,  but she is retired and is quite content with this lifestyle. Eyes and  nose itch. She is out of her medications. She continues allergy vaccine  at 1:10 with no problems.   MEDICATIONS:  We reviewed her medications which are charted and  significant today for Allegra, Flonase, her allergy vaccine, albuterol  inhaler, Provigil 200 mg.   OBJECTIVE:  Weight is down 11 pounds to 252, blood pressure 130/74,  pulse 51, room air saturation is 96%. Is obese with no acute discomfort  evident and mood is cheerful.  Conjunctivae are clear. Nasal mucosa looks normal. Throat is clear.  CHEST: Clear.  Heart sounds regular without murmur or gallop.   IMPRESSION:  1. Poor sleep hygiene with some component of residual daytime      sleepiness after CPAP therapy for sleep apnea. I think she is      compliant with the CPAP.  2. Allergic rhinitis and asthma are fairly well-controlled.   PLAN:  Sleep hygiene was again reviewed. Continue CPAP with auto  titration. We refilled her fluticasone nasal spray and her Allegra 60 mg  b.i.d. p.r.n. Continue weight loss. Schedule return in  six months,  earlier p.r.n. Emphasis on her responsibility to drive safely.     Clinton D. Maple Hudson, MD, Tonny Bollman, FACP  Electronically Signed    CDY/MedQ  DD: 06/30/2007  DT: 07/01/2007  Job #: [id number]

## 2011-04-04 LAB — URINE CULTURE
Colony Count: >=100000
Culture  Setup Time: 201205151403

## 2011-04-06 NOTE — Telephone Encounter (Signed)
Fax sent per [place]. Me

## 2011-04-06 NOTE — Assessment & Plan Note (Signed)
[facility]                             PULMONARY OFFICE NOTE   NAME:[name], [name]                         MRN:          [id number]  DATE:01/01/2007                            DOB:          05/30/1946    PROBLEMS:  1. Bronchitis.  2. Obstructive sleep apnea/auto-titration CPAP.  3. Excessive daytime somnolence/Provigil.  4. Mild congestive heart failure.  5. Hypertension.  6. Anxiety-depression.  7. Allergic rhinitis.  8. Diabetes.   HISTORY:  She still has not established a primary physician, but she is  seeing Dr. Talmage Nap for her diabetes.  Her main discomforts this week have  been with arthritis pains.  She says that she is doing fine with her  CPAP which is comfortable and used every night.  She still feels that  she needs a stimulant medication/Provigil some mornings, but this is  occasional.  She is trying to maintain better sleep habits.  She is  convinced her allergy vaccine helps her.  That is now at 1:10, given  here with no problems.  Overall she is satisfied with her current  status.   MEDICATIONS:  1. Gynodiol 0.5 mg.  2. Avandia 2 mg.  3. Cartia 180 mg b.i.d.  4. Diovan 160 mg.  5. Metoprolol 50 mg.  6. Hydrochlorothiazide 50 mg.  7. Isorbid ER 60 mg.  8. Potassium 20 mEq x3.  9. Aspirin 81 mg.  10.Zetia 10 mg.  11.Prevacid.  12.Allegra 60 mg.  13.Flonase.  14.Arthrotec.  15.Wellbutrin 300 mg.  16.Effexor 225 mg.  17.Nitroglycerin pump.  18.Allergy vaccine.  19.Provigil 200 mg.  20.Rescue albuterol, used rarely.   ALLERGIES:  DRUG INTOLERANCE TO CEFTIN, CELEBREX, SULFA and CODEINE.   OBJECTIVE:  Weight 263 pounds, BP 168/72, pulse regular 60.  First  recorded her oxygen saturation at 88% on room air after walking down the  hall, but as soon as she sat down and settled it was 97%, so the first  was probably inaccurate.  Breathing was unlabored, she seems alert and  in good spirits, still obese.  Nasal airways  unobstructed, pharynx clear.  LUNGS:  Clear to P&A with no cough or wheeze today.  HEART:  Sounds regular without murmur.  There is no edema.  No pressure marks around her face from the CPAP.   IMPRESSION:  1. Obstructive sleep apnea, well controlled.  2. Allergic rhinitis, well controlled off season.   PLAN:  She will continue present treatments and continue making efforts  to lose some weight.  Schedule return in 6 months, but earlier p.r.n.     Clinton D. Maple Hudson, MD, Tonny Bollman, FACP  Electronically Signed    CDY/MedQ  DD: 01/01/2007  DT: 01/01/2007  Job #: [id number]   cc:   Dorisann Frames, M.D.

## 2011-04-06 NOTE — Assessment & Plan Note (Signed)
[facility]                               PULMONARY OFFICE NOTE   NAME:[name] [name]                         MRN:          [id number]  DATE:09/09/2006                            DOB:          1945-12-09    PROBLEM:  1. Bronchitis.  2. Obstructive sleep apnea with hypersomnia/autotitration continuous      positive airway pressure.  3. Excessive daytime somnolence/Provigil.  4. Mild congestive heart failure.  5. Hypertension.  6. Anxiety/depression.  7. Allergic rhinitis.  8. Diabetes.   HISTORY:  She is frequently waking during the night.  Her CPAP mask shifts,  and she wiggles it around.  Sometimes, she is sleepy in the daytime but then  sometimes she says she feels like staying up all night.  She does not take  naps.  She is retired and feels no need to keep a regular sleep schedule.  She has not been diagnosed with a bipolar disorder to her knowledge.  She  had increased nasal congestion blamed on allergy in mid-September but is  better from that now.  Provigil is being used only occasionally.  She  continues allergy vaccine here at 1 to 10.   MEDICATIONS:  Her list is extensive and charted, reviewed with her.  Significant now for Allegra 60 mg, Flonase, Provigil, albuterol inhaler,  autotitration CPAP.   ALLERGIES:  DRUG INTOLERANCE TO CEFTIN, SULFA DRUGS, AND TO CODEINE.   OBJECTIVE:  VITAL SIGNS:  Weight 267 pounds, blood pressure 138/68, pulse  regular at 55, room air saturation 96%.  GENERAL:  This is an obese, talkative woman in good spirits but not over-  exuberant.  LUNGS:  Lung fields sound clear.  HEART:  Heart sounds are regular and normal.  EXTREMITIES:  There is no tremor.   IMPRESSION:  I think the primary problem is poor sleep hygiene as long as  her continuous positive airway pressure is controlled.   PLAN:  1. Emphasis on good sleep hygiene, with education done.  2. She will use her Provigil 200 mg p.r.n. in the  a.m.  3. Schedule return in 4 months.  4. She is looking for a new primary physician.     Clinton D. Maple Hudson, MD, FCCP, FACP    CDY/MedQ  DD: 09/13/2006  DT: 09/16/2006  Job #: [id number]

## 2011-04-06 NOTE — Op Note (Signed)
NAMETAKIMA, ENCINA                            ACCOUNT NO.:  [id number]   MEDICAL RECORD NO.:  [id number]                   PATIENT TYPE:  OIB   LOCATION:  2899                                 FACILITY:  [facility]   PHYSICIAN:  Claude Manges. Cleophas Dunker, M.D.            DATE OF BIRTH:  1945-11-25   DATE OF PROCEDURE:  02/17/2004  DATE OF DISCHARGE:                                 OPERATIVE REPORT   PREOPERATIVE DIAGNOSES:  1. Degenerative joint disease tricompartmental, right knee.  2. Multiple osteocartilaginous loose bodies.  3. Tear of medial meniscus.   POSTOPERATIVE DIAGNOSIS:  1. Degenerative joint disease tricompartmental, right knee.  2. Multiple osteocartilaginous loose bodies.  3. Tear of medial meniscus.   OPERATION PERFORMED:  1. Arthroscopic debridement of medial and lateral compartments and     patellofemoral joint, right knee.  2. Partial medial meniscectomy.  3. Removal of loose bodies.   SURGEON:  Claude Manges. Cleophas Dunker, M.D.   ANESTHESIA:  IV sedation and local 1% Xylocaine with epinephrine.   COMPLICATIONS:  None.   INDICATIONS FOR PROCEDURE:  The patient is a 65 year old female with a  history of rheumatoid arthritis.  She has been experiencing progressive pain  in her right knee.  X-rays revealed considerable arthrosis all three  compartments with evidence of loose bodies.  She has had an MRI scan  revealing a tear of the medial meniscus as well as a sprain of the medial  collateral ligament.  She had a recent injury in the last several months  when she suddenly bent over to pick up a napkin and experienced excruciating  pain that felt like something else was wrong with her knee and thus an MRI  scan was performed demonstrating a tear of the medial meniscus.  Despite  time, medicines, physical therapy, she continues to have pain and wishes to  proceed with arthroscopic debridement knowing full well that this may not  alleviate all of her pain because of the  significant arthrosis.   DESCRIPTION OF PROCEDURE:  With the patient comfortable on the operating  table and under IV sedation, the right lower extremity was placed in a thigh  holder and thigh tourniquet.  The leg was then prepped with DuraPrep in a  thigh holder to the ankle.  Sterile draping was performed.  Diagnostic  arthroscopy was performed using a medial and lateral parapatellar tendon  stab wound.  There was a positive clear yellow joint effusion.   Diagnostic arthroscopy revealed diffuse synovitis in the superior pouch as  well as in the medial and lateral compartment.  There was thinning of the  ACL.  There were several large osteocartilaginous loose bodies identified in  the intercondylar notch.  These were retrieved with the pituitary rongeur.   There was considerable loss of articular cartilage at the patellofemoral  joint and over 50% of the patellar articular cartilage was absent.  There  were large  osteophytes along the medial and lateral femoral condyle at the  level of the patellofemoral joint.  Both gutters were clear of loose bodies  but there was moderate synovitis.  A synovectomy was performed.  The medial  compartment revealed considerable chondromalacia of the femoral condyle with  large osteophytes and to a lesser extent, the medial tibial plateau.  There  was a complex tear of the medial meniscus beginning at its midportion  extending to the posterior horn.  This was debrided with a basket forceps as  well as the intra-articular shaver such that a very nice and stable meniscal  rim.   The loose bodies were predominantly within the intercondylar notch.  There  was one that measured nearly a centimeter in length and probably 5 to 6 mm  in width.  This was retrieved with basket forceps.  There were three or four  smaller osteocartilaginous loose bodies that were less than 1 cm.  These  were also retrieved with the pituitary as well as the intra-articular   shaver.  The lateral compartment revealed considerable chondromalacia but to  a lesser extent than the medial compartment.  The meniscus appeared to be  intact.  Shaving of the compartment was performed just to taper the femoral  condyle.   The joint was then explored with no evidence of loose material.  The two  stab wounds were left open and infiltrated with 1% Xylocaine with  epinephrine.  Sterile bulky dressing was applied followed by an Ace bandage.   PLAN:  Mepergan Fortis for pain.  Office one week.                                               Claude Manges. Cleophas Dunker, M.D.    PWW/MEDQ  D:  02/17/2004  T:  02/17/2004  Job:  [id number]

## 2011-04-11 ENCOUNTER — Inpatient Hospital Stay (HOSPITAL_COMMUNITY)
Admission: RE | Admit: 2011-04-11 | Discharge: 2011-04-16 | DRG: 460 | Disposition: A | Discharge status: Rehabilitation Facility | Payer: Medicare Other | Source: Ambulatory Visit | Attending: Neurosurgery | Admitting: Neurosurgery

## 2011-04-11 ENCOUNTER — Inpatient Hospital Stay (HOSPITAL_COMMUNITY): Payer: Medicare Other

## 2011-04-11 DIAGNOSIS — I10 Essential (primary) hypertension: Secondary | ICD-10-CM | POA: Diagnosis present

## 2011-04-11 DIAGNOSIS — M48061 Spinal stenosis, lumbar region without neurogenic claudication: Secondary | ICD-10-CM | POA: Diagnosis present

## 2011-04-11 DIAGNOSIS — M5126 Other intervertebral disc displacement, lumbar region: Principal | ICD-10-CM | POA: Diagnosis present

## 2011-04-11 DIAGNOSIS — G4733 Obstructive sleep apnea (adult) (pediatric): Secondary | ICD-10-CM | POA: Diagnosis present

## 2011-04-11 DIAGNOSIS — M4310 Spondylolisthesis, site unspecified: Secondary | ICD-10-CM | POA: Diagnosis present

## 2011-04-11 DIAGNOSIS — E119 Type 2 diabetes mellitus without complications: Secondary | ICD-10-CM | POA: Diagnosis present

## 2011-04-11 DIAGNOSIS — I252 Old myocardial infarction: Secondary | ICD-10-CM

## 2011-04-11 HISTORY — PX: POSTERIOR LUMBAR FUSION: SHX6036

## 2011-04-11 LAB — GLUCOSE, CAPILLARY
Glucose-Capillary: 165 mg/dL — ABNORMAL HIGH (ref 70–99)
Glucose-Capillary: 209 mg/dL — ABNORMAL HIGH (ref 70–99)
Glucose-Capillary: 238 mg/dL — ABNORMAL HIGH (ref 70–99)
Glucose-Capillary: 250 mg/dL — ABNORMAL HIGH (ref 70–99)

## 2011-04-11 LAB — ABO/RH: ABO/RH(D): A POS

## 2011-04-12 DIAGNOSIS — M47817 Spondylosis without myelopathy or radiculopathy, lumbosacral region: Secondary | ICD-10-CM

## 2011-04-12 DIAGNOSIS — IMO0002 Reserved for concepts with insufficient information to code with codable children: Secondary | ICD-10-CM

## 2011-04-12 DIAGNOSIS — M48061 Spinal stenosis, lumbar region without neurogenic claudication: Secondary | ICD-10-CM

## 2011-04-12 LAB — DIFFERENTIAL
Basophils Absolute: 0 10*3/uL (ref 0.0–0.1)
Basophils Relative: 0 % (ref 0–1)
Eosinophils Absolute: 0 10*3/uL (ref 0.0–0.7)
Eosinophils Relative: 0 % (ref 0–5)
Lymphocytes Relative: 7 % — ABNORMAL LOW (ref 12–46)
Lymphs Abs: 0.6 10*3/uL — ABNORMAL LOW (ref 0.7–4.0)
Monocytes Absolute: 0.6 10*3/uL (ref 0.1–1.0)
Monocytes Relative: 7 % (ref 3–12)
Neutro Abs: 7.4 10*3/uL (ref 1.7–7.7)
Neutrophils Relative %: 87 % — ABNORMAL HIGH (ref 43–77)

## 2011-04-12 LAB — GLUCOSE, CAPILLARY
Glucose-Capillary: 207 mg/dL — ABNORMAL HIGH (ref 70–99)
Glucose-Capillary: 215 mg/dL — ABNORMAL HIGH (ref 70–99)
Glucose-Capillary: 255 mg/dL — ABNORMAL HIGH (ref 70–99)
Glucose-Capillary: 267 mg/dL — ABNORMAL HIGH (ref 70–99)

## 2011-04-12 LAB — CBC
HCT: 26.1 % — ABNORMAL LOW (ref 36.0–46.0)
Hemoglobin: 8.8 g/dL — ABNORMAL LOW (ref 12.0–15.0)
MCH: 29.1 pg (ref 26.0–34.0)
MCHC: 33.7 g/dL (ref 30.0–36.0)
MCV: 86.4 fL (ref 78.0–100.0)
Platelets: 87 10*3/uL — ABNORMAL LOW (ref 150–400)
RBC: 3.02 MIL/uL — ABNORMAL LOW (ref 3.87–5.11)
RDW: 15.3 % (ref 11.5–15.5)
WBC: 8.5 10*3/uL (ref 4.0–10.5)

## 2011-04-12 LAB — BASIC METABOLIC PANEL
BUN: 8 mg/dL (ref 6–23)
CO2: 28 mEq/L (ref 19–32)
Calcium: 8.2 mg/dL — ABNORMAL LOW (ref 8.4–10.5)
Chloride: 96 mEq/L (ref 96–112)
Creatinine, Ser: 0.55 mg/dL (ref 0.4–1.2)
GFR calc Af Amer: >60 mL/min (ref >60)
GFR calc non Af Amer: >60 mL/min (ref >60)
Glucose, Bld: 221 mg/dL — ABNORMAL HIGH (ref 70–99)
Potassium: 3 mEq/L — ABNORMAL LOW (ref 3.5–5.1)
Sodium: 131 mEq/L — ABNORMAL LOW (ref 135–145)

## 2011-04-13 LAB — GLUCOSE, CAPILLARY
Glucose-Capillary: 183 mg/dL — ABNORMAL HIGH (ref 70–99)
Glucose-Capillary: 221 mg/dL — ABNORMAL HIGH (ref 70–99)
Glucose-Capillary: 208 mg/dL — ABNORMAL HIGH (ref 70–99)
Glucose-Capillary: 185 mg/dL — ABNORMAL HIGH (ref 70–99)

## 2011-04-14 LAB — GLUCOSE, CAPILLARY
Glucose-Capillary: 155 mg/dL — ABNORMAL HIGH (ref 70–99)
Glucose-Capillary: 154 mg/dL — ABNORMAL HIGH (ref 70–99)
Glucose-Capillary: 221 mg/dL — ABNORMAL HIGH (ref 70–99)
Glucose-Capillary: 150 mg/dL — ABNORMAL HIGH (ref 70–99)

## 2011-04-14 LAB — BASIC METABOLIC PANEL
BUN: 12 mg/dL (ref 6–23)
CO2: 27 mEq/L (ref 19–32)
Calcium: 8.3 mg/dL — ABNORMAL LOW (ref 8.4–10.5)
Chloride: 95 mEq/L — ABNORMAL LOW (ref 96–112)
Creatinine, Ser: 0.61 mg/dL (ref 0.4–1.2)
GFR calc Af Amer: >60 mL/min (ref >60)
GFR calc non Af Amer: >60 mL/min (ref >60)
Glucose, Bld: 170 mg/dL — ABNORMAL HIGH (ref 70–99)
Potassium: 3.8 mEq/L (ref 3.5–5.1)
Sodium: 130 mEq/L — ABNORMAL LOW (ref 135–145)

## 2011-04-15 ENCOUNTER — Inpatient Hospital Stay (HOSPITAL_COMMUNITY): Payer: Medicare Other

## 2011-04-15 LAB — TYPE AND SCREEN
ABO/RH(D): A POS
Antibody Screen: NEGATIVE
Unit division: 0
Unit division: 0

## 2011-04-15 LAB — GLUCOSE, CAPILLARY
Glucose-Capillary: 132 mg/dL — ABNORMAL HIGH (ref 70–99)
Glucose-Capillary: 137 mg/dL — ABNORMAL HIGH (ref 70–99)
Glucose-Capillary: 148 mg/dL — ABNORMAL HIGH (ref 70–99)
Glucose-Capillary: 151 mg/dL — ABNORMAL HIGH (ref 70–99)

## 2011-04-16 ENCOUNTER — Inpatient Hospital Stay (HOSPITAL_COMMUNITY)
Admission: RE | Admit: 2011-04-16 | Discharge: 2011-04-28 | DRG: 945 | Disposition: A | Discharge status: Home Health | Payer: Medicare Other | Source: Other Acute Inpatient Hospital | Attending: Physical Medicine & Rehabilitation | Admitting: Physical Medicine & Rehabilitation

## 2011-04-16 DIAGNOSIS — I10 Essential (primary) hypertension: Secondary | ICD-10-CM

## 2011-04-16 DIAGNOSIS — M4310 Spondylolisthesis, site unspecified: Secondary | ICD-10-CM

## 2011-04-16 DIAGNOSIS — I509 Heart failure, unspecified: Secondary | ICD-10-CM

## 2011-04-16 DIAGNOSIS — G5790 Unspecified mononeuropathy of unspecified lower limb: Secondary | ICD-10-CM

## 2011-04-16 DIAGNOSIS — Z79899 Other long term (current) drug therapy: Secondary | ICD-10-CM

## 2011-04-16 DIAGNOSIS — N390 Urinary tract infection, site not specified: Secondary | ICD-10-CM

## 2011-04-16 DIAGNOSIS — Z981 Arthrodesis status: Secondary | ICD-10-CM

## 2011-04-16 DIAGNOSIS — Z5189 Encounter for other specified aftercare: Principal | ICD-10-CM

## 2011-04-16 DIAGNOSIS — E8809 Other disorders of plasma-protein metabolism, not elsewhere classified: Secondary | ICD-10-CM

## 2011-04-16 DIAGNOSIS — Z7982 Long term (current) use of aspirin: Secondary | ICD-10-CM

## 2011-04-16 DIAGNOSIS — D696 Thrombocytopenia, unspecified: Secondary | ICD-10-CM

## 2011-04-16 DIAGNOSIS — G4733 Obstructive sleep apnea (adult) (pediatric): Secondary | ICD-10-CM

## 2011-04-16 DIAGNOSIS — E871 Hypo-osmolality and hyponatremia: Secondary | ICD-10-CM

## 2011-04-16 DIAGNOSIS — E119 Type 2 diabetes mellitus without complications: Secondary | ICD-10-CM

## 2011-04-16 DIAGNOSIS — M5126 Other intervertebral disc displacement, lumbar region: Secondary | ICD-10-CM

## 2011-04-16 DIAGNOSIS — D62 Acute posthemorrhagic anemia: Secondary | ICD-10-CM

## 2011-04-16 DIAGNOSIS — M48061 Spinal stenosis, lumbar region without neurogenic claudication: Secondary | ICD-10-CM

## 2011-04-16 DIAGNOSIS — S92919A Unspecified fracture of unspecified toe(s), initial encounter for closed fracture: Secondary | ICD-10-CM

## 2011-04-16 DIAGNOSIS — IMO0001 Reserved for inherently not codable concepts without codable children: Secondary | ICD-10-CM

## 2011-04-16 DIAGNOSIS — E876 Hypokalemia: Secondary | ICD-10-CM

## 2011-04-16 DIAGNOSIS — M47817 Spondylosis without myelopathy or radiculopathy, lumbosacral region: Secondary | ICD-10-CM

## 2011-04-16 DIAGNOSIS — A498 Other bacterial infections of unspecified site: Secondary | ICD-10-CM

## 2011-04-16 DIAGNOSIS — X500XXA Overexertion from strenuous movement or load, initial encounter: Secondary | ICD-10-CM

## 2011-04-16 DIAGNOSIS — IMO0002 Reserved for concepts with insufficient information to code with codable children: Secondary | ICD-10-CM

## 2011-04-16 LAB — GLUCOSE, CAPILLARY
Glucose-Capillary: 145 mg/dL — ABNORMAL HIGH (ref 70–99)
Glucose-Capillary: 154 mg/dL — ABNORMAL HIGH (ref 70–99)

## 2011-04-17 DIAGNOSIS — IMO0002 Reserved for concepts with insufficient information to code with codable children: Secondary | ICD-10-CM

## 2011-04-17 DIAGNOSIS — M48061 Spinal stenosis, lumbar region without neurogenic claudication: Secondary | ICD-10-CM

## 2011-04-17 LAB — GLUCOSE, CAPILLARY
Glucose-Capillary: 138 mg/dL — ABNORMAL HIGH (ref 70–99)
Glucose-Capillary: 151 mg/dL — ABNORMAL HIGH (ref 70–99)
Glucose-Capillary: 135 mg/dL — ABNORMAL HIGH (ref 70–99)
Glucose-Capillary: 122 mg/dL — ABNORMAL HIGH (ref 70–99)

## 2011-04-17 LAB — URINALYSIS, MICROSCOPIC ONLY
Bilirubin Urine: NEGATIVE
Glucose, UA: NEGATIVE mg/dL
Hgb urine dipstick: NEGATIVE
Ketones, ur: NEGATIVE mg/dL
Protein, ur: NEGATIVE mg/dL
Urobilinogen, UA: 1 mg/dL (ref 0.0–1.0)

## 2011-04-17 LAB — CBC
HCT: 27.2 % — ABNORMAL LOW (ref 36.0–46.0)
MCV: 86.6 fL (ref 78.0–100.0)
Platelets: 118 10*3/uL — ABNORMAL LOW (ref 150–400)
RBC: 3.14 MIL/uL — ABNORMAL LOW (ref 3.87–5.11)
RDW: 16.1 % — ABNORMAL HIGH (ref 11.5–15.5)
WBC: 5.8 10*3/uL (ref 4.0–10.5)

## 2011-04-17 LAB — DIFFERENTIAL
Basophils Absolute: 0 10*3/uL (ref 0.0–0.1)
Eosinophils Absolute: 0.1 10*3/uL (ref 0.0–0.7)
Eosinophils Relative: 1 % (ref 0–5)
Lymphocytes Relative: 20 % (ref 12–46)
Lymphs Abs: 1.1 10*3/uL (ref 0.7–4.0)
Neutrophils Relative %: 65 % (ref 43–77)

## 2011-04-17 LAB — COMPREHENSIVE METABOLIC PANEL
ALT: 17 U/L (ref 0–35)
Albumin: 2.6 g/dL — ABNORMAL LOW (ref 3.5–5.2)
Alkaline Phosphatase: 65 U/L (ref 39–117)
Chloride: 92 mEq/L — ABNORMAL LOW (ref 96–112)
Potassium: 3.1 mEq/L — ABNORMAL LOW (ref 3.5–5.1)
Sodium: 133 mEq/L — ABNORMAL LOW (ref 135–145)
Total Bilirubin: 1 mg/dL (ref 0.3–1.2)
Total Protein: 5.8 g/dL — ABNORMAL LOW (ref 6.0–8.3)

## 2011-04-18 LAB — URINE CULTURE

## 2011-04-18 LAB — BASIC METABOLIC PANEL
BUN: 5 mg/dL — ABNORMAL LOW (ref 6–23)
Creatinine, Ser: <0.47 mg/dL (ref 0.4–1.2)
Glucose, Bld: 130 mg/dL — ABNORMAL HIGH (ref 70–99)
Potassium: 3.6 mEq/L (ref 3.5–5.1)

## 2011-04-18 LAB — GLUCOSE, CAPILLARY
Glucose-Capillary: 150 mg/dL — ABNORMAL HIGH (ref 70–99)
Glucose-Capillary: 162 mg/dL — ABNORMAL HIGH (ref 70–99)

## 2011-04-19 LAB — GLUCOSE, CAPILLARY
Glucose-Capillary: 127 mg/dL — ABNORMAL HIGH (ref 70–99)
Glucose-Capillary: 155 mg/dL — ABNORMAL HIGH (ref 70–99)
Glucose-Capillary: 124 mg/dL — ABNORMAL HIGH (ref 70–99)

## 2011-04-20 ENCOUNTER — Inpatient Hospital Stay (HOSPITAL_COMMUNITY): Payer: Medicare Other

## 2011-04-20 DIAGNOSIS — IMO0002 Reserved for concepts with insufficient information to code with codable children: Secondary | ICD-10-CM

## 2011-04-20 DIAGNOSIS — M48061 Spinal stenosis, lumbar region without neurogenic claudication: Secondary | ICD-10-CM

## 2011-04-20 LAB — CBC
HCT: 27.6 % — ABNORMAL LOW (ref 36.0–46.0)
MCHC: 33 g/dL (ref 30.0–36.0)
Platelets: 158 10*3/uL (ref 150–400)
RDW: 16.3 % — ABNORMAL HIGH (ref 11.5–15.5)
WBC: 6 10*3/uL (ref 4.0–10.5)

## 2011-04-20 LAB — BASIC METABOLIC PANEL
BUN: 6 mg/dL (ref 6–23)
Creatinine, Ser: <0.47 mg/dL (ref 0.4–1.2)
Glucose, Bld: 115 mg/dL — ABNORMAL HIGH (ref 70–99)
Potassium: 3 mEq/L — ABNORMAL LOW (ref 3.5–5.1)
Sodium: 134 mEq/L — ABNORMAL LOW (ref 135–145)

## 2011-04-20 LAB — GLUCOSE, CAPILLARY
Glucose-Capillary: 125 mg/dL — ABNORMAL HIGH (ref 70–99)
Glucose-Capillary: 143 mg/dL — ABNORMAL HIGH (ref 70–99)

## 2011-04-21 ENCOUNTER — Inpatient Hospital Stay (HOSPITAL_COMMUNITY): Payer: Medicare Other

## 2011-04-21 DIAGNOSIS — M48061 Spinal stenosis, lumbar region without neurogenic claudication: Secondary | ICD-10-CM

## 2011-04-21 DIAGNOSIS — IMO0002 Reserved for concepts with insufficient information to code with codable children: Secondary | ICD-10-CM

## 2011-04-21 LAB — GLUCOSE, CAPILLARY
Glucose-Capillary: 124 mg/dL — ABNORMAL HIGH (ref 70–99)
Glucose-Capillary: 154 mg/dL — ABNORMAL HIGH (ref 70–99)

## 2011-04-22 LAB — GLUCOSE, CAPILLARY
Glucose-Capillary: 121 mg/dL — ABNORMAL HIGH (ref 70–99)
Glucose-Capillary: 156 mg/dL — ABNORMAL HIGH (ref 70–99)

## 2011-04-23 LAB — BASIC METABOLIC PANEL
CO2: 29 mEq/L (ref 19–32)
Calcium: 9.3 mg/dL (ref 8.4–10.5)
Glucose, Bld: 123 mg/dL — ABNORMAL HIGH (ref 70–99)
Sodium: 134 mEq/L — ABNORMAL LOW (ref 135–145)

## 2011-04-23 LAB — GLUCOSE, CAPILLARY
Glucose-Capillary: 140 mg/dL — ABNORMAL HIGH (ref 70–99)
Glucose-Capillary: 110 mg/dL — ABNORMAL HIGH (ref 70–99)

## 2011-04-24 LAB — GLUCOSE, CAPILLARY
Glucose-Capillary: 138 mg/dL — ABNORMAL HIGH (ref 70–99)
Glucose-Capillary: 163 mg/dL — ABNORMAL HIGH (ref 70–99)
Glucose-Capillary: 146 mg/dL — ABNORMAL HIGH (ref 70–99)

## 2011-04-25 DIAGNOSIS — M48061 Spinal stenosis, lumbar region without neurogenic claudication: Secondary | ICD-10-CM

## 2011-04-25 DIAGNOSIS — IMO0002 Reserved for concepts with insufficient information to code with codable children: Secondary | ICD-10-CM

## 2011-04-25 LAB — GLUCOSE, CAPILLARY: Glucose-Capillary: 130 mg/dL — ABNORMAL HIGH (ref 70–99)

## 2011-04-25 NOTE — Op Note (Signed)
NAMETALASIA, SAULTER                ACCOUNT NO.:  [id number]  MEDICAL RECORD NO.:  [id number]           PATIENT TYPE:  I  LOCATION:  3008                         FACILITY:  [facility]  PHYSICIAN:  Hewitt Shorts, M.D.DATE OF BIRTH:  1946/07/19  DATE OF PROCEDURE:  04/11/2011 DATE OF DISCHARGE:                              OPERATIVE REPORT   PREOPERATIVE DIAGNOSES: 1. Left L4-5 lumbar disk herniation. 2. 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 3. Lumbar stenosis, and lumbar spondylosis.  POSTOPERATIVE DIAGNOSES: 1. Left L4-5 lumbar disk herniation. 2. 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 3. Lumbar stenosis, and lumbar spondylosis.  PROCEDURES:  Bilateral L4-5 lumbar laminotomy, facetectomy, foraminotomy, and microdiskectomy with decompression of the thecal sac and exiting L4 and L5 nerve roots bilaterally with decompression beyond that required for interbody arthrodesis with microdissection and microsurgical technique, L4-5 posterior lumbar interbody arthrodesis with interbody implants and Vitoss bone marrow aspirate and Infuse and bilateral L4-5 posterolateral arthrodesis with radius posterior instrumentation, Vitoss bone marrow aspirate and Infuse.  SURGEON:  Hewitt Shorts, MD  ASSISTANT:  Stefani Dama, MD  ANESTHESIA:  General endotracheal.  INDICATION:  The patient is a 65 year old woman who has been under treatment for multilevel multifactorial lumbar stenosis.  This responded well to epidural steroid injections; however, about a month ago, she developed weakness of the proximal left lower extremity, was falling repeatedly, was found to have the left iliopsoas with 3- to 4-/5 strength.  MRI was repeated and compared to an MRI done in January 2012 and was found to show a new left L4-5 lumbar disk herniation with a fragment migrated behind the body of L4 and because of significant weakness, the decision was made to proceed with decompression  and stabilization.  PROCEDURE:  The patient was brought to the operating room and placed under general endotracheal anesthesia.  The patient was turned to prone position, lumbar region was prepped with Betadine and soap solution and draped in sterile fashion.  The midline was infiltrated with local anesthetic with epinephrine and an x-ray was taken and the L4-5 level was identified.  The midline incision was made and carried down through the subcutaneous tissue.  Bipolar cautery and electrocautery were used to maintain hemostasis.  Dissection was carried down through lumbar fascia which was incised bilaterally and the paraspinal muscles were dissected from the spinous process and lamina in a subperiosteal fashion.  An x-ray was taken of the L4-5 and interlaminar level identified.  Dissection was then carried out laterally over the facet complexes, and we exposed the transverse process of L4 and L5.  Using magnification and microdissection and microsurgical technique, we proceeded with decompression.  An L4 and L5 laminectomy was performed using the high-speed drill, double-action rongeurs, and Kerrison punches.  Facetectomy was performed and foraminotomy was performed.  We exposed the thecal sac at L4 and L5 nerve roots bilaterally.  We then began to explore the ventral epidural space.  The posterior aspect of the annulus was calcified.  We examined the ventral epidural space behind the body of L4 on the left side and mobilized several large fragments of the three disk herniations.  These  were removed and we were able to decompress the thecal sac and nerve roots.  We then proceeded with the interbody diskectomy opening the annulus with osteotome and entering into the disk space, we used the Kerrison punches to remove the calcified annulus and the diskectomy was performed using microcurettes and pituitary rongeurs.  We prepared the endplates with paddle curettes removing the  cartilaginous endplate surface.  We then proceeded with placing the pedicle screws bilaterally at L4 and L5.  Pedicle entry points were identified, the C-arm fluoroscope was draped and brought into field, and we probed each of the four pedicles. Bone marrow was aspirated and injected over the 10 mL strip of Vitoss. Each of the pedicles was examined with the ball probe, good bony surfaces were found.  Each was tapped with a 5.25-mm tap, again good bony surfaces were found, good threading was found, and then we placed a 5.75 x 40-mm screws bilaterally at each level.  All of this was done with C-arm fluoroscopic guidance.  We then packed the PEEK interbody implants with a combination of Vitoss and Infuse.  At first, we attempted to place implant on the right side. We first tried an 8-mm implant but it was loose.  We then tried a 9-mm implant that again was loose, and we examined the interbody space and found that there was some disruption of the endplates on the right side and therefore, we elected to place only a single implant from the left side but slightly moved towards the midline.  We used the 9 mm x 25 mm x 4-degree implants.  It was countersunk and then we packed the interbody space with additional Vitoss with bone marrow aspirates on either side of the interbody implants.  We then selected 30-mm rods and placed within the screw heads and secured with locking caps.  Once all four locking caps were placed, final tightening was performed against the counter torque.  We then packed the lateral gutter between the transverse process of L4 and the transverse process of L5 with combination of Infuse and Vitoss bone marrow aspirates which was gently tamped down against the transverse processes.  The wound was then irrigated numerous times during the procedure with saline solution and bacitracin solution.  Another irrigation was performed and then we proceeded with closure.   Paraspinal muscle were approximated with interrupted undyed 1-0 Vicryl sutures, deep fascia was closed with interrupted undyed 1-0 Vicryl sutures. Scarpa fascia was closed with interrupted undyed 1-0 Vicryl sutures. The subcutaneous and subcuticular were closed with interrupted inverted 2-0 undyed Vicryl sutures.  The skin was approximated with Dermabond. The wound was dressed with sterile gauze and 4-inch Hypafix.  The procedure was tolerated well.  The estimated blood loss was 300 mL.  We did use a Cell Saver, we were able to return 100 mL of Cell Saver blood to the patient.  Sponge and needle count were correct.  Following the surgery, the patient was turned back to supine position to be reversed from the anesthetic, extubated, and transferred to the recovery room for further care.     Hewitt Shorts, M.D.    RWN/MEDQ  D:  04/11/2011  T:  04/12/2011  Job:  [id number]  Electronically Signed by Shirlean Kelly M.D. on 04/25/2011 09:48:15 AM

## 2011-04-25 NOTE — H&P (Signed)
[name] [name], [name] [name]                ACCOUNT NO.:  [id number]  MEDICAL RECORD NO.:  [id number]           PATIENT TYPE:  I  LOCATION:  3008                         FACILITY:  [facility]  PHYSICIAN:  Hewitt Shorts, M.D.DATE OF BIRTH:  29-Apr-1946  DATE OF ADMISSION:  04/11/2011 DATE OF DISCHARGE:                             HISTORY & PHYSICAL   ADMISSION HISTORY AND PHYSICAL EXAMINATION:  The patient is a 65 year old right-handed white female whom I have treated off and on over the past 9 years.  She has a long history of pain secondary to the fibromyalgia and osteoarthritis.  She had been evaluated with MRI scan that showed grade 1 spondylolisthesis of L4-L5.  X-rays showed that was mildly dynamic to flexion and extension.  She had stenosis at the L2-3 level and stenosis of the L4-5 level and she was being managed with periodic epidural steroid injections and has responded well to those over the years; however, about a months ago she had a number of falls and came into the office for evaluation.  She was found to have significant proximal left lower extremity weakness.  She was restarted with an updated MRI and this showed in comparison to her most recent MRI done in January of this year a new large left L4-5 disk herniation with a fragment that had migrated rostrally behind the body of L4, a somewhat worse stenosis at the L4-5 level in addition to the previous tobacco developed L2-3 stenosis.  Because of the significant weakness and pain, decision was made to proceed with lumbar decompression and stabilization.  The patient is admitted now for such.  PAST MEDICAL HISTORY:  Notable for a history of hypertension for 40 years, diabetes for 6 years, hypercholesterolemia, depression, obstructive sleep apnea, fibromyalgia, and osteoarthritis.  She has a history of myocardial infarction in 1999.  No history of stroke, cancer, peptic ulcer disease, or lung disease.  Previous surgeries  are numerous include dental extractions, breast biopsy, microscopic knee surgery, hysterectomy, appendectomy, carpal tunnel release, and tonsillectomy.  She reports allergies or intolerances to a number of medications, Hydralazine causes lupus-like symptoms including a high white blood cell count, swelling of the hands and feet, CELEBREX, CEFTIN, SULFA, CODEINE, DOXYCYCLINE, and ALEVE.  CURRENT MEDICATION: 1. Arthrotec 75/200 b.i.d. 2. Amlodipine 10 mg q.a.m. 3. Aspirin 81 mg daily. 4. __________ 2 mg injections subcutaneously weekly. 5. Vitamin D2 50,000 International Units weekly. 6. Lasix 20 mg today. 7. Isosorbide mononitrate XR 60 mg daily. 8. Clonidine 0.2 mg b.i.d. 9. Diovan plus hydrochlorothiazide 160/12.5 b.i.d. 10.Crestor 5 mg daily. 11.Zetia 10 mg daily. 12.Diltiazem CD/XT 180 mg daily. 13.Lansoprazole 30 mg b.i.d. 14.Metoprolol 50 mg b.i.d. 15.Bupropion XL 300 mg daily. 16.Effexor XR 37.5 mg daily. 17.Metformin 500 mg b.i.d. 18.Potassium chloride 20 mEq b.i.d. 19.Methylprednisolone 4 mg tablets tapering schedule.  FAMILY HISTORY:  Parents have passed on.  Mother had polymyalgia rheumatica and COPD.  Father had myocardial infarction, five-vessel bypass surgery, hypertension, stroke, hypercholesterolemia, and COPD.  SOCIAL HISTORY:  The patient is married.  She is a former Armed forces operational officer.  She does not smoke, drink alcohol, or have a  history of substance abuse.  REVIEW OF SYSTEMS:  Notable for those described in history of present illness and past medical history but is otherwise unremarkable.  PHYSICAL EXAMINATION:  GENERAL:  The patient is an obese white female in no acute distress. VITAL SIGNS:  Temperature 97, pulse 60, blood pressure 137/80, respiratory rate 20. LUNGS:  Clear to auscultation.  She has symmetric respiratory excursion. HEART:  Regular rate and rhythm with S1 and S2.  There is no murmur. EXTREMITIES:  Mild-to-moderate edema.  There is  venous stasis changes in the distal legs.  No clubbing or cyanosis. MUSCULOSKELETAL:  She has no tenderness to palpation of the lumbar spinous process or paralumbar musculature. NEUROLOGIC:  Left iliopsoas is 3 to 4-/5.  The remainder of the lower extremities 5/5 in the right iliopsoas as well as the quadriceps, dorsiflexors, extensor hallucis longus, and plantar flexion bilaterally. Sensation decreased pinprick in the left foot as compared to the right foot.  Reflexes in the left quadriceps is absent, right quadriceps is 1, gastrocnemius are absent bilaterally, toes are downgoing bilaterally. Gait and stance shows moderate unsteadiness and she requires assistance to get up from a seated position as well as needs assistance to get up onto a stool to the examination table.  IMPRESSION:  New proximal left lower extremity weakness found to be due to a new left L4-5 lumbar disk herniation that migrated rostrally behind the L4 on top of preexisting L4-5 spinal stenosis and dynamic degenerative spondylolisthesis grade 1 to 2 as well as stenosis at the L2-3 level with multiple medical comorbidities as described above.  PLAN:  The patient will be admitted for bilateral L4-5 lumbar decompression and stabilization including laminectomy, facetectomy, foraminotomy, left L4-5 microdiskectomy, L4-5 posterior lumbar interbody arthrodesis with interbody implants and bone graft and bilateral L4-5 posterolateral arthrodesis with posterior instrumentation and bone grafts.  I have discussed the nature of her condition, nature of her surgical procedure, typical length of surgery, hospital stay, and overall recuperation and limitations postoperatively, need for postoperative immobilization in a lumbar brace and risks including risks of infection, bleeding, possible need for transfusion, risk of nerve dysfunction with pain, weakness, numbness, or paresthesias, risk of dural tear and CSF leakage, possible  need of further surgery, the risk of failure of the arthrodesis, failure of the fusion construct, possible need for further surgery and anesthetic risk of myocardial infarction, stroke, pneumonia, and death.  After understanding all these, she would like to proceed with surgery.  She understands that these risks are significantly increased due to her numerous medical comorbidities including hypertension, diabetes, heart disease, chronic steroid use, obesity, sleep apnea, and so on.     Hewitt Shorts, M.D.     RWN/MEDQ  D:  04/11/2011  T:  04/12/2011  Job:  [id number]  Electronically Signed by Shirlean Kelly M.D. on 04/25/2011 09:48:11 AM

## 2011-04-26 LAB — GLUCOSE, CAPILLARY
Glucose-Capillary: 148 mg/dL — ABNORMAL HIGH (ref 70–99)
Glucose-Capillary: 133 mg/dL — ABNORMAL HIGH (ref 70–99)
Glucose-Capillary: 135 mg/dL — ABNORMAL HIGH (ref 70–99)

## 2011-04-27 DIAGNOSIS — M48061 Spinal stenosis, lumbar region without neurogenic claudication: Secondary | ICD-10-CM

## 2011-04-27 DIAGNOSIS — IMO0002 Reserved for concepts with insufficient information to code with codable children: Secondary | ICD-10-CM

## 2011-04-27 LAB — GLUCOSE, CAPILLARY
Glucose-Capillary: 129 mg/dL — ABNORMAL HIGH (ref 70–99)
Glucose-Capillary: 171 mg/dL — ABNORMAL HIGH (ref 70–99)

## 2011-04-28 LAB — GLUCOSE, CAPILLARY: Glucose-Capillary: 130 mg/dL — ABNORMAL HIGH (ref 70–99)

## 2011-05-04 NOTE — Discharge Summary (Signed)
  NAMELAKECHIA, [name] [name]                ACCOUNT NO.:  [id number]  MEDICAL RECORD NO.:  [id number]  LOCATION:  3008                         FACILITY:  [facility]  PHYSICIAN:  Hewitt Shorts, M.D.DATE OF BIRTH:  07-Apr-1946  DATE OF ADMISSION:  04/11/2011 DATE OF DISCHARGE:  04/16/2011                              DISCHARGE SUMMARY   ADMISSION HISTORY AND PHYSICAL EXAMINATION:  The patient is a 65 year old woman who has had difficulties with pain and weakness in the low back and into the lower extremities.  We treated her with epidural steroid injections over the years.  She was noted to have stenosis at L2- 3 and L4-5, a dynamic degenerative spondylolisthesis, grade 1 of L4-5. However, because of increasing new falls, she was found on exam to have significant proximal left lower extremity weakness.  She was restarted with an update MRI that showed a new large left L4-5 disk herniation with a fragment migrated roster behind the body of L4 with worsened stenosis at the L4-5 level, and the patient was admitted for L4-5 decompression and stabilization.  PAST HISTORY:  Notable for hypertension, diabetes, hypercholesterolemia, depression, obstructive sleep apnea, fibromyalgia, and osteoarthritis.  PHYSICAL EXAMINATION:  GENERAL:  An obese female. LUNGS:  Clear. CARDIAC AND ABDOMEN:  Unremarkable. EXTREMITIES:  She had mild-to-moderate edema in the distal lower extremities with venous stasis change in the distal lower extremities. NEUROLOGIC:  Notable for the left iliopsoas being 3 to 4-/5.  The remainder of the lower extremity strength is 5/5 including the right iliopsoas, quadriceps, dorsiflexors, extensor hallucis longus, and plantar flexor bilaterally.  Sensation is intact to pinprick in the right foot, but decreased in the left foot.  The left quadriceps reflexes absent, the right quadriceps 1.  The gastrocnemius are absent bilaterally.  Toes are downgoing bilaterally.  Gait and stance  showed moderate unsteadiness, and she required assistance to get up from a seated position as well as to get from a stool up on to a examination table.  HOSPITAL COURSE:  The patient was admitted, underwent a bilateral L4-5 lumbar decompression, posterior lumbar interbody fusion, and posterolateral arthrodesis with interbody implants, post instrumentation bone graft following surgery.  She did well initially with some diminished urine output treated with saline bolus.  Physical Therapy and Occupational Therapy were consulted, and they recommended comprehensive inpatient rehabilitation.  Physical Medicine Rehabilitation was consulted.  She had good relief of the radicular pain following surgery, but still had proximal weakness.  She had some hypokalemia, which was treated, and she continued to use PT and OT Services.  Rehab agreed to accept the patient in transfer, and the patient was accepted and transferred to the rehabilitation unit on the day of discharge.  DISCHARGE DIAGNOSES:  Lumbar disk herniation, lumbar stenosis, lumbar grade 1 dynamic degenerative spondylolisthesis, lumbar radiculopathy, hyponatremia and hypokalemia both of which were treated.     Hewitt Shorts, M.D.     RWN/MEDQ  D:  05/02/2011  T:  05/03/2011  Job:  [id number]  Electronically Signed by Shirlean Kelly M.D. on 05/04/2011 07:20:37 AM

## 2011-05-08 DIAGNOSIS — Z0271 Encounter for disability determination: Secondary | ICD-10-CM

## 2011-05-15 ENCOUNTER — Inpatient Hospital Stay (HOSPITAL_COMMUNITY)
Admission: AD | Admit: 2011-05-15 | Discharge: 2011-05-18 | DRG: 552 | Disposition: A | Discharge status: Home Health | Payer: Medicare Other | Source: Ambulatory Visit | Attending: Neurosurgery | Admitting: Neurosurgery

## 2011-05-15 ENCOUNTER — Inpatient Hospital Stay (HOSPITAL_COMMUNITY): Payer: Medicare Other

## 2011-05-15 DIAGNOSIS — M545 Low back pain, unspecified: Principal | ICD-10-CM | POA: Diagnosis present

## 2011-05-15 DIAGNOSIS — E876 Hypokalemia: Secondary | ICD-10-CM | POA: Diagnosis present

## 2011-05-15 DIAGNOSIS — Z981 Arthrodesis status: Secondary | ICD-10-CM

## 2011-05-15 LAB — CBC
HCT: 33.9 % — ABNORMAL LOW (ref 36.0–46.0)
Hemoglobin: 11.5 g/dL — ABNORMAL LOW (ref 12.0–15.0)
MCH: 27.7 pg (ref 26.0–34.0)
MCHC: 33.9 g/dL (ref 30.0–36.0)
MCV: 81.7 fL (ref 78.0–100.0)
Platelets: 152 10*3/uL (ref 150–400)
RBC: 4.15 MIL/uL (ref 3.87–5.11)
RDW: 14.7 % (ref 11.5–15.5)
WBC: 6.3 10*3/uL (ref 4.0–10.5)

## 2011-05-15 LAB — BASIC METABOLIC PANEL
BUN: 4 mg/dL — ABNORMAL LOW (ref 6–23)
CO2: 22 mEq/L (ref 19–32)
Calcium: 9.3 mg/dL (ref 8.4–10.5)
Chloride: 97 mEq/L (ref 96–112)
Creatinine, Ser: <0.47 mg/dL — ABNORMAL LOW (ref 0.50–1.10)
Glucose, Bld: 123 mg/dL — ABNORMAL HIGH (ref 70–99)
Potassium: 2.6 mEq/L — CL (ref 3.5–5.1)
Sodium: 135 mEq/L (ref 135–145)

## 2011-05-15 LAB — DIFFERENTIAL
Basophils Absolute: 0 10*3/uL (ref 0.0–0.1)
Basophils Relative: 0 % (ref 0–1)
Eosinophils Absolute: 0 10*3/uL (ref 0.0–0.7)
Eosinophils Relative: 1 % (ref 0–5)
Lymphocytes Relative: 17 % (ref 12–46)
Lymphs Abs: 1.1 10*3/uL (ref 0.7–4.0)
Monocytes Absolute: 0.7 10*3/uL (ref 0.1–1.0)
Monocytes Relative: 12 % (ref 3–12)
Neutro Abs: 4.4 10*3/uL (ref 1.7–7.7)
Neutrophils Relative %: 70 % (ref 43–77)

## 2011-05-15 LAB — TYPE AND SCREEN
ABO/RH(D): A POS
Antibody Screen: NEGATIVE

## 2011-05-15 LAB — GLUCOSE, CAPILLARY: Glucose-Capillary: 161 mg/dL — ABNORMAL HIGH (ref 70–99)

## 2011-05-15 MED ORDER — GADOBENATE DIMEGLUMINE 529 MG/ML IV SOLN
20.0000 mL | Freq: Once | INTRAVENOUS | Status: AC | PRN
  Administered 2011-05-15: 20 mL via INTRAVENOUS

## 2011-05-15 NOTE — Discharge Summary (Signed)
NAMESRISHTI, STRNAD                ACCOUNT NO.:  [id number]  MEDICAL RECORD NO.:  [id number]  LOCATION:  4006                         FACILITY:  [facility]  PHYSICIAN:  Ranelle Oyster, M.D.DATE OF BIRTH:  12-02-1945  DATE OF ADMISSION:  04/16/2011 DATE OF DISCHARGE:  04/28/2011                              DISCHARGE SUMMARY   DISCHARGE DIAGNOSES: 1. 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 2. Hypertension. 3. Diabetes mellitus type 2. 4. Obstructive sleep apnea. 5. Osteoarthritis. 6. Morbid obesity.  HISTORY OF PRESENT ILLNESS:  Ms. Lalley is a 65 year old female with history of fibromyalgia, chronic low back pain with decreased mobility due to L4-L5 HNP with spondylolisthesis and lumbar stenosis.  The patient elected to undergo bilateral L4-L5 laminectomy with decompression of L4, L5 nerve roots and arthrodesis by Dr. Newell Coral on Apr 11, 2011, postop has had issues with hypokalemia with potassium down to 3.0 and acute blood loss anemia with hemoglobin at 8.8.  Her hypokalemia was supplemented with improvement.  Blood sugars have been elevated and meal coverage NovoLog was added to help with blood sugar control.  The patient was also noted to have some issues with frequency. She continues on Cipro which she has been on at home for E-coli UTI. Therapies initiated and the patient is noted to have poor safety as well as difficulty with mobility due to pain.  Narcotics were increased 24 hours ago and the patient is noted to have issues with lethargy and somnolence.  L-spine x-rays done showed good positioning of hardware.  PAST MEDICAL HISTORY:  Significant for: 1. Hypertension. 2. Fibromyalgia syndrome. 3. Depression and anxiety disorder. 4. DM type 2. 5. Allergic rhinitis. 6. CHF. 7. Carpal tunnel release. 8. Obstructive sleep apnea with history of daytime somnolence. 9. Carpal tunnel release. 10.T and A. 11.Morbid  obesity.  ALLERGIES:  HYDRALAZINE, CELEBREX, SULFA, CODEINE and CEPHALOSPORIN.  REVIEW OF SYMPTOMS:  Positive for weakness, anxiety, lethargy as well as wound care issues.  FAMILY HISTORY:  Positive for polymyalgia rheumatica, coronary artery disease and CVA.  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  Does not use any tobacco or alcohol. Husband is supportive and can provide supervision past discharge.  FUNCTIONAL HISTORY:  The patient was independent prior to admission, did require some assist with lower body ADLs.  FUNCTIONAL STATUS:  The patient is min assist for lower body dressing, min to total assist for toileting.  Min guard assist for transfers, min guard assist ambulating 30 feet with a rolling walker.  PHYSICAL EXAMINATION:  VITAL SIGNS:  Blood pressure 159/73, pulse 79, respirations 16, temperature 99.0. GENERAL:  The patient is obese female sedated, but arousable in no acute distress. HEENT:  Pupils equal, round, reactive to light.  Oral mucosa is pink and moist.  Nares patent.  Good dentition. NECK:  Supple without JVD or lymphadenopathy. LUNGS:  Clear to auscultation bilaterally without wheezes, rales or rhonchi. HEART:  Regular rate and rhythm without murmurs or gallops. ABDOMEN:  Round, with positive bowel sounds.  Nontender. EXTREMITIES:  Multiple ecchymosis on bilateral forearms.  Noted  to have pain with left lower extremity range of motion in hip, knee and ankle. SKIN:  Back incision is clean, dry and intact with ecchymotic area surrounding back incision.  Stasis changes bilateral lower extremity. NEUROLOGIC:  The patient is sedated, but arousable.  Noted to be anxious appearing as well as occasional bouts of lability.  She is oriented x3. However, mood and judgment are impaired.  Upper extremity strength is at 4+/5 proximal and distal.  Lower extremity strength is 2+, right greater than left at  hips, knee flexion and extension is at 3+/5 and plantar dorsiflexions are 4+/5.  HOSPITAL COURSE:  Ms. Sheza Strickland was admitted to rehab on Apr 15, 2009, for inpatient therapies to consist of PT, OT at least 3 hours 5 days a week.  Past admission physiatrist, rehab RN, and therapy team have worked together to provide customized collaborative interdisciplinary care.  Rehab RN has worked with the patient on bowel and bladder program as well as on pain management issues.  Labs done at the time of admission showed H and H to be improving at 9.0 and 27.0. Thrombocytopenia improving with platelets of 118.  Check of lytes revealed hyponatremia with sodium at 133 and potassium at 3.1.  Her potassium supplements were increased to t.i.d. basis.  She has required increased supplements throughout the stay to prevent recurrent hypokalemia.  Most recent check of lytes of June 2004, shows much improvement with sodium 134, potassium 3.4, chloride 96, CO2 29, BUN 7, creatinine 0.47, glucose 123.  The patient did receive some Klonopin past admission which caused excessive sedation.  This was discontinued. Additionally Flexeril was discontinued and Ativan was made on p.r.n. basis with improvement in the patient's mentation.  Currently, pain is managed with a scheduled Ultram 50 mg p.o. b.i.d. and OxyIR being used judiciously.  The patient has had lot of complaints of neuropathy left lower extremity from hip down to her foot.  She was started on Neurontin to help with pain management.  With increase in mobility, the patient had complaints of excruciating pain in her left foot.  X-rays of left foot done revealed fractures involving bases of proximal phalanges of the left fourth and fifth toe.  Toes were buddy-taped and a cast shoe was ordered to help with the patient's symptoms and this has been effective.  X-rays of pelvic were also done showing proximal femur to be grossly intact.  Sacral and SI joint  stable and left 4-5 posterior interbody fusion with hardware stable.  By the time of discharge pain control is much improved, sedation has resolved.  Mood has greatly improved without any anxiety issues.  The patient has been afebrile during this stay.  Blood pressures checked have been at 130s to 140s systolic, 70s diastolic.  CBG is checked on before meal at bedtime basis and these have ranged from 120-130s overall with occasional high in 150s.  The patient to continue on carb-modified diet and follow up with primary care physician for further adjustment in her diabetic regimen.  The patient's back incision has been monitored along.  This is healing well without any signs or symptoms of infection.  Ecchymosis around incision site has resolved.  During the patient's stay in rehab, weekly team conferences were held to monitor the patient's progress, set goals as well as discuss barriers to discharge.  At the time of admission, the patient was noted to have decreased strength, decreased functional mobility, decreased cognitive status with difficulty following commands and attending to tasks.  She was also noted to have decreased activity tolerance with increased acute pain as well as decreased balance.  The patient was total assist for bed mobility, max assist to supervision to sit at the edge of bed depending on level of alertness.  Total assist +3 for stand pivot transfers.  As the patient's mentation improved, she has made good progress.  Currently, she is at supervision level for all transfers, bed mobility, and ambulation of household distances.  She is min assist for car transfers.  The patient's husband has completed family education in all areas of mobility and further followup home health physical therapy to continue past discharge.  Occupational Therapy has worked with the patient on her ability to carry out self- care tasks.  Initially, the patient was limited due to her  cognitive status as well as decreased balance and increase in pain.  Currently, she is progressed to being at supervision level for bathing and upper body dressing.  She requires min assist for lower body dressing with assistance to don shoes.  She is at min assist for tub shower transfers. The patient's husband to provide supervision assistance as needed for ADLs past discharge.  Further followup home health OT to continue past discharge.  On April 28, 2011, the patient is discharged to home.  DISCHARGE MEDICATIONS: 1. Wellbutrin XL 300 mg p.o. per day. 2. Cardizem CD 180 mg p.o. per day. 3. Lasix 20 mg p.o. per day. 4. Imdur 60 mg p.o. per day. 5. Zetia 5 mg p.o. per day. 6. Metoprolol 50 mg p.o. b.i.d. 7. Metformin 500 mg p.o. b.i.d. 8. Crestor 5 mg p.o. at bedtime. 9. Effexor XR 37.5 mg p.o. per day. 10.Medrol 4 mg p.o. per day. 11.Diovan/hydrochlorothiazide 160/12.5 two tablets p.o. per day. 12.Neurontin 100 mg 2 p.o. b.i.d. and 3 at bedtime. 13.Lidocaine patch to left hip on at 8 a.m., off at 8 p.m. daily. 14.Ativan 0.5 mg at bedtime p.r.n. anxiety. 15.Clonidine 0.2 mg p.o. b.i.d. 16.Bydureon injection 2 mg subcu q. Week. 17.Ultracet one p.o. b.i.d. 18.OxyIR 5 mg one to two p.o. q.6 h. p.r.n. moderate-to-severe pain     #75 Rx'ed. 19.Lansoprazole 30 mg one p.o. b.i.d. 20.K-Dur 20 mEq one p.o. t.i.d. note increased dose. 21.Vitamin D 50,000 units one p.o. q. week. 22.Coated aspirin 81 mg p.o. per day.  DIET:  Carb-modified medium.  ACTIVITY LEVEL:  24-hour supervision.  SPECIAL INSTRUCTIONS:  No strenuous activity.  No alcohol, no smoking, no driving.  Maintain back precautions via brace when at edge of bed and out of bed.  Wear cast shoe on left foot for now until symptoms improve. [facility] to provide PT, OT.  Do not use diclofenac.  FOLLOWUP:  The patient to follow up with Dr. Riley Kill as needed.  Follow up with Dr. Newell Coral and Dr. Ricki Miller for routine check in 2  weeks.     Delle Reining, P.A.   ______________________________ Ranelle Oyster, M.D.    PL/MEDQ  D:  04/27/2011  T:  04/28/2011  Job:  [id number]  cc:   [name], M.D. Hewitt Shorts, M.D.  Electronically Signed by Osvaldo Shipper. on 05/04/2011 03:42:42 PM Electronically Signed by Faith Rogue M.D. on 05/15/2011 09:28:45 AM

## 2011-05-16 LAB — GLUCOSE, CAPILLARY
Glucose-Capillary: 102 mg/dL — ABNORMAL HIGH (ref 70–99)
Glucose-Capillary: 110 mg/dL — ABNORMAL HIGH (ref 70–99)
Glucose-Capillary: 117 mg/dL — ABNORMAL HIGH (ref 70–99)
Glucose-Capillary: 115 mg/dL — ABNORMAL HIGH (ref 70–99)

## 2011-05-16 LAB — BASIC METABOLIC PANEL
Chloride: 103 mEq/L (ref 96–112)
Potassium: 3.5 mEq/L (ref 3.5–5.1)
Sodium: 139 mEq/L (ref 135–145)

## 2011-05-16 LAB — SEDIMENTATION RATE: Sed Rate: 14 mm/hr (ref 0–22)

## 2011-05-17 LAB — BASIC METABOLIC PANEL
BUN: 7 mg/dL (ref 6–23)
CO2: 24 mEq/L (ref 19–32)
Calcium: 9.1 mg/dL (ref 8.4–10.5)
Chloride: 102 mEq/L (ref 96–112)
Creatinine, Ser: 0.97 mg/dL (ref 0.50–1.10)
GFR calc Af Amer: >60 mL/min (ref >60)
GFR calc non Af Amer: 58 mL/min — ABNORMAL LOW (ref >60)
Glucose, Bld: 131 mg/dL — ABNORMAL HIGH (ref 70–99)
Potassium: 4.4 mEq/L (ref 3.5–5.1)
Sodium: 136 mEq/L (ref 135–145)

## 2011-05-17 LAB — GLUCOSE, CAPILLARY
Glucose-Capillary: 138 mg/dL — ABNORMAL HIGH (ref 70–99)
Glucose-Capillary: 135 mg/dL — ABNORMAL HIGH (ref 70–99)
Glucose-Capillary: 114 mg/dL — ABNORMAL HIGH (ref 70–99)
Glucose-Capillary: 106 mg/dL — ABNORMAL HIGH (ref 70–99)

## 2011-05-17 NOTE — H&P (Signed)
[name] [name], [name] [name]                ACCOUNT NO.:  [id number]  MEDICAL RECORD NO.:  [id number]  LOCATION:  3016                         FACILITY:  [facility]  PHYSICIAN:  Hewitt Shorts, M.D.DATE OF BIRTH:  09/21/1946  DATE OF ADMISSION:  05/15/2011 DATE OF DISCHARGE:                             HISTORY & PHYSICAL   HISTORY OF PRESENT ILLNESS:  The patient is a 65 year old right-handed white female who is about 5 weeks status post a L4-5 lumbar decompression, posterior lumbar interbody fusion, and posterolateral arthrodesis for a grade 1 spondylolisthesis L4-5 with a large left L4-5 disk herniation.  Following acute hospitalization, she was transferred to the rehabilitation service at [facility] and then after about little less than 2-week stay, she was discharged to home.  She was seen in followup 11 days ago.  X-rays at that time looked good.  She has received home health physical therapy.  However, yesterday evening, she developed acute pain in the low back. She was unable to rest at all last night.  She contacted the office today and we had her come in today for evaluation and seen in the office.  She was having disabling low back pain.  X-rays at the office appeared to show a new subluxation of L5 anteriorly and S1 with the L4-5 fusion construct intact.  The patient was admitted to [facility] for pain management and workup.  PAST MEDICAL HISTORY:  Notable for history of hypertension for 40 years, diabetes for 6 years, hypercholesterolemia, depression, obstructive sleep apnea, fibromyalgia, and osteoarthritis.  She has a history of myocardial function in 1999.  She does not describe any history of stroke, cancer, peptic ulcer disease, or lung disease.  Previous surgeries are numerous including a recent lumbar surgery, but also dental extractions, breast biopsy, arthroscopic knee surgery, hysterectomy, appendectomy, carpal tunnel release, and  tonsillectomy. She reports allergies or intolerances to a number of medications. HYDRALAZINE causes a lupus-like syndrome with a high white blood cell count, swelling of the hands and feet.  CELEBREX, CEFTIN, SULFA, CODEINE, DOXYCYCLINE, and ALEVE.  MEDICATIONS ON ADMISSION: 1. Zetia 5 mg daily. 2. Vitamin D2 50,000 international units every week. 3. Potassium chloride 20 mg t.i.d. 4. Metoprolol 50 mg b.i.d. 5. Metformin 500 mg b.i.d. 6. Lorazepam 0.5 mg p.r.n. 7. Lidocaine patch. 8. Lansoprazole 30 mg b.i.d. 9. Isosorbide mononitrate XR 60 mg daily. 10.Gabapentin 300 mg t.i.d. 11.Lasix 20 mg daily. 12.Effexor XR 37.5 mg daily. 13.Valsartan and hydrochlorothiazide 160/12.5 two tablets daily. 14.Diltiazem CD/XR 180 mg daily. 15.Crestor 5 mg daily. 16.Clonidine 0.2 mg 2 tablets at bedtime. 17.Byetta 2 mg subcutaneous every week on Saturdays. 18.Bupropion XL 200 mg daily. 19.Aspirin 81 mg today. 20.Tramadol with acetaminophen 1 tablet b.i.d.  FAMILY HISTORY:  Parents have passed on.  Mother had polymyalgia rheumatica and COPD.  Father had myocardial function, five-vessel bypass surgery, hypertension, stroke, hypercholesterolemia, and COPD.  SOCIAL HISTORY:  The patient is married.  She is a former Armed forces operational officer.  She does not smoke, drink beverages, or have a history of substance abuse.  REVIEW OF SYSTEMS:  Notable for those described in the history of present illness and past medical history, but is otherwise  unremarkable.  PHYSICAL EXAMINATION:  GENERAL:  The patient is an obese white female in no acute distress. VITAL SIGNS:  Temperature is 98.1, pulse 101, blood pressure 179/82. LUNGS:  Clear to auscultation.  She has symmetric respiratory excursion. HEART:  Regular rate and rhythm.  S1 and S2.  There is no murmur. ABDOMEN:  Soft, nontender, and nondistended.  Bowel sounds present. MUSCULOSKELETAL:  A well-healing lumbar incision. NEUROLOGIC:  Dorsiflexion on the  left is 4/5, dorsiflexion on the right is 4- to 4/5, plantar flexion is 5 bilaterally.  Sensation is decreased to pinprick in the feet.  IMPRESSION:  Acute disabling low back pain, which developed a little over a day ago in the patient who is 5 weeks status post lumbar decompression and arthrodesis of the L4-5 level.  X-ray is suspicious for new subluxation of L5 and S1.  PLAN:  The patient was admitted for pain management.  We will start her on a PCA morphine pump.  We will proceed with workup with MRI of the lumbar spine without and with gadolinium, and we will be able give further recommendations after the MRI is done.  Admission laboratories and other orders are written.     Hewitt Shorts, M.D.     RWN/MEDQ  D:  05/15/2011  T:  05/16/2011  Job:  [id number]  Electronically Signed by Shirlean Kelly M.D. on 05/17/2011 02:19:51 PM

## 2011-05-18 LAB — GLUCOSE, CAPILLARY
Glucose-Capillary: 131 mg/dL — ABNORMAL HIGH (ref 70–99)
Glucose-Capillary: 120 mg/dL — ABNORMAL HIGH (ref 70–99)

## 2011-05-28 NOTE — Discharge Summary (Signed)
  [name] [name], [name] [name]                ACCOUNT NO.:  [id number]  MEDICAL RECORD NO.:  [id number]  LOCATION:  3016                         FACILITY:  [facility]  PHYSICIAN:  Hewitt Shorts, M.D.DATE OF BIRTH:  12/03/1945  DATE OF ADMISSION:  05/15/2011 DATE OF DISCHARGE:  05/18/2011                              DISCHARGE SUMMARY   ADMISSION HISTORY AND PHYSICAL EXAMINATION:  The patient is a 65 year old woman who is 5 weeks status post a L4-5 lumbar decompression and arthrodesis.  She was transferred to the rehabilitation service following surgery and then was discharged to home.  She had acute pain in her low back on the day of admission and it started the day before and with disabling, she came to the office and because of severity of her pain, she was admitted to the hospital for pain management and further workup.  Examination seems to suggest some weakness in the distal lower extremities.  Otherwise, general examination was unremarkable.  HOSPITAL COURSE:  The patient was admitted, started on PCA morphine.  We obtained an MRI of the lumbar spine.  Unfortunately, the quality was limited by motion artifact.  There are postsurgical changes and some mild stenosis at the surgical level.  No evidence of abnormal enhancement to suggest infection.  The patient had a normal white blood cell count at 6.3.  She has been afebrile throughout the hospitalization and erythrocyte sedimentation rate was 14.  However, admission chemistry showed a significant hypokalemia, potassium 2.6.  She was given potassium replacement and it steadily increased such that yesterday, it was up to 4.4.  The patient is to continue her usual potassium replacement at home and followup with her primary physician.  We are able to stop the PCA and transition her to Percocet.  She has been able to walk in the halls.  Her wound is healing nicely.  No erythema, swelling, or drainage, and we are discharging to home  on Percocet 1-2 tablets p.o. q.i.d. p.r.n. pain.  We prescribed 100 tablets with no refills.  She is to return in the middle of July for followup in my office or sooner if she has increased difficulties.  She is to continue all of her usual home medications in the meantime.  DISCHARGE DIAGNOSIS:  Low back pain and hypokalemia (now resolved).     Hewitt Shorts, M.D.     RWN/MEDQ  D:  05/18/2011  T:  05/18/2011  Job:  [id number]  Electronically Signed by Shirlean Kelly M.D. on 05/28/2011 08:34:29 AM

## 2011-05-30 ENCOUNTER — Telehealth: Payer: Self-pay | Admitting: Internal Medicine

## 2011-05-30 NOTE — Telephone Encounter (Signed)
Pt had back surgery late May 2012 and has been in and out of the hospital approx 2 to 3 times. Currently is at [facility] Rm[facility][id number] and will be transferred to a facility for PT. States is unsure when pt stopped allergy shots but is "doing well". Will forward to Dr. Maple Hudson for review.

## 2011-06-18 NOTE — Telephone Encounter (Signed)
Noted. Ok to stay off allergy shots and watch to see how she does.

## 2011-06-18 NOTE — Telephone Encounter (Signed)
Spoke with patients husband-he is aware of recs from [facility].

## 2011-06-20 ENCOUNTER — Other Ambulatory Visit: Payer: Self-pay | Admitting: Neurosurgery

## 2011-06-20 DIAGNOSIS — M545 Low back pain: Secondary | ICD-10-CM

## 2011-06-27 ENCOUNTER — Other Ambulatory Visit: Payer: Medicare Other

## 2011-07-09 ENCOUNTER — Ambulatory Visit
Admission: RE | Admit: 2011-07-09 | Discharge: 2011-07-09 | Disposition: A | Discharge status: Home / Self Care | Payer: Medicare Other | Source: Ambulatory Visit | Attending: Neurosurgery | Admitting: Neurosurgery

## 2011-07-09 ENCOUNTER — Ambulatory Visit: Payer: Self-pay | Admitting: Internal Medicine

## 2011-07-09 DIAGNOSIS — M545 Low back pain: Secondary | ICD-10-CM

## 2011-07-09 MED ORDER — GADOBENATE DIMEGLUMINE 529 MG/ML IV SOLN
18.0000 mL | Freq: Once | INTRAVENOUS | Status: AC | PRN
  Administered 2011-07-09: 18 mL via INTRAVENOUS

## 2011-07-18 ENCOUNTER — Other Ambulatory Visit: Payer: Self-pay | Admitting: Neurosurgery

## 2011-07-18 DIAGNOSIS — M549 Dorsalgia, unspecified: Secondary | ICD-10-CM

## 2011-07-27 ENCOUNTER — Other Ambulatory Visit: Payer: Medicare Other

## 2011-07-27 ENCOUNTER — Ambulatory Visit
Admission: RE | Admit: 2011-07-27 | Discharge: 2011-07-27 | Disposition: A | Discharge status: Home / Self Care | Payer: Medicare Other | Source: Ambulatory Visit | Attending: Neurosurgery | Admitting: Neurosurgery

## 2011-07-27 ENCOUNTER — Telehealth: Payer: Self-pay | Admitting: *Deleted

## 2011-07-27 DIAGNOSIS — M549 Dorsalgia, unspecified: Secondary | ICD-10-CM

## 2011-07-27 NOTE — Telephone Encounter (Signed)
Pt. was in town for a dr's appt. So she came by here to find out where to go from here. Mrs.[name] [name] had back surgery back in May. I told her I'd ask you where you wanted [place] to restart her vac.Marland Kitchen Her last shot was 03-07-2011 1:10 at 0.5. Please advise. She will be back in town early next wk. Please let me know what to do so I can let her know.

## 2011-07-31 NOTE — Telephone Encounter (Signed)
Does she feel that she wants to go back on allergy shots?  If so, we can restart her at 1:50, 0.1 ml/ week   rebuild as tolerated. Please let me know so i can rewrite script - I would also need to have her last vaccine contents, so I can update.

## 2011-07-31 NOTE — Telephone Encounter (Signed)
Yes, pt. Is interested in restarting her shots. I called Mrs.Montour and lmom to call me back and let me know when she'll be back in town so I can have her vac. ready for her.

## 2011-08-02 ENCOUNTER — Ambulatory Visit (INDEPENDENT_AMBULATORY_CARE_PROVIDER_SITE_OTHER): Payer: Medicare Other

## 2011-08-02 DIAGNOSIS — J309 Allergic rhinitis, unspecified: Secondary | ICD-10-CM

## 2011-08-06 ENCOUNTER — Other Ambulatory Visit: Payer: Self-pay | Admitting: Obstetrics & Gynecology

## 2011-08-06 DIAGNOSIS — Z1231 Encounter for screening mammogram for malignant neoplasm of breast: Secondary | ICD-10-CM

## 2011-08-16 ENCOUNTER — Ambulatory Visit (INDEPENDENT_AMBULATORY_CARE_PROVIDER_SITE_OTHER): Payer: Medicare Other

## 2011-08-16 ENCOUNTER — Ambulatory Visit
Admission: RE | Admit: 2011-08-16 | Discharge: 2011-08-16 | Disposition: A | Discharge status: Home / Self Care | Payer: Medicare Other | Source: Ambulatory Visit | Attending: Obstetrics & Gynecology | Admitting: Obstetrics & Gynecology

## 2011-08-16 DIAGNOSIS — Z1231 Encounter for screening mammogram for malignant neoplasm of breast: Secondary | ICD-10-CM

## 2011-08-16 DIAGNOSIS — J309 Allergic rhinitis, unspecified: Secondary | ICD-10-CM

## 2011-08-16 IMAGING — MG MM DIGITAL SCREENING {BCG}
5 series · 5 of 5 positions shown · non-contrast
Comparison: none

DG SCREEN MAMMOGRAM BILATERAL
Bilateral CC and MLO view(s) were taken.
Technologist: Memni Gontara, RT, RM

DIGITAL SCREENING MAMMOGRAM WITH CAD:
There are scattered fibroglandular densities.  No masses or malignant type calcifications are 
identified.  Compared with prior studies.
Images were processed with CAD.

[R CC]
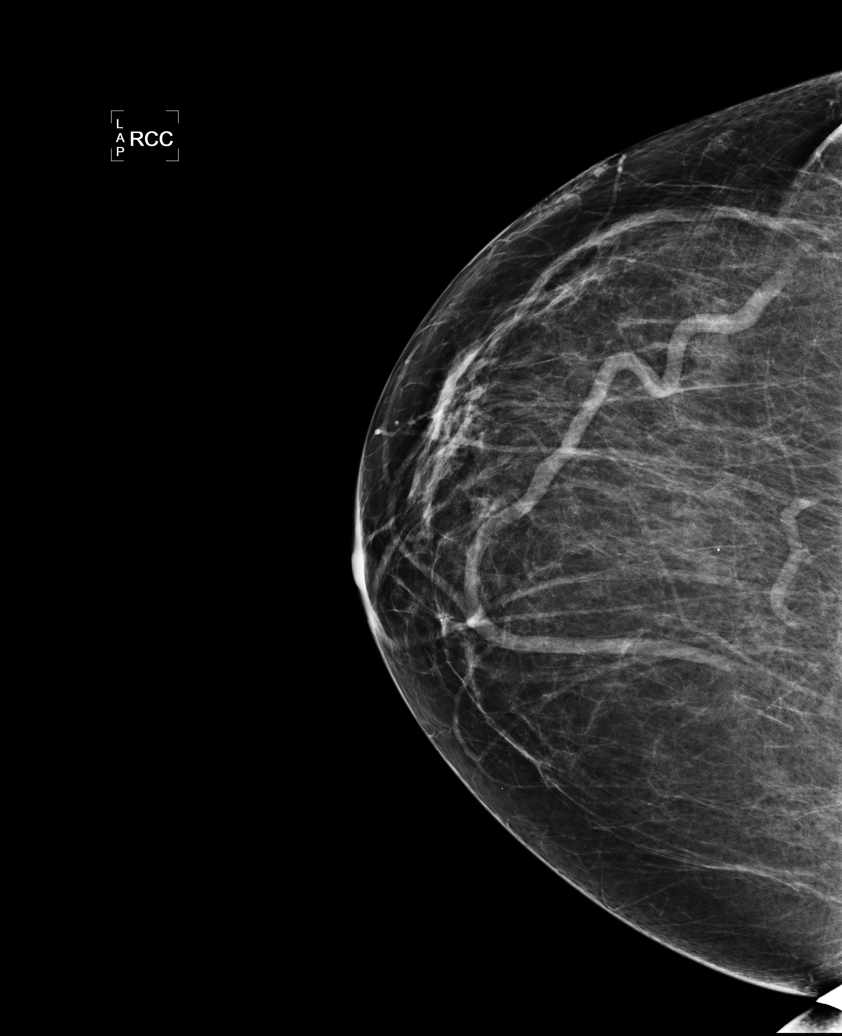

[L CC (1 of 2)]
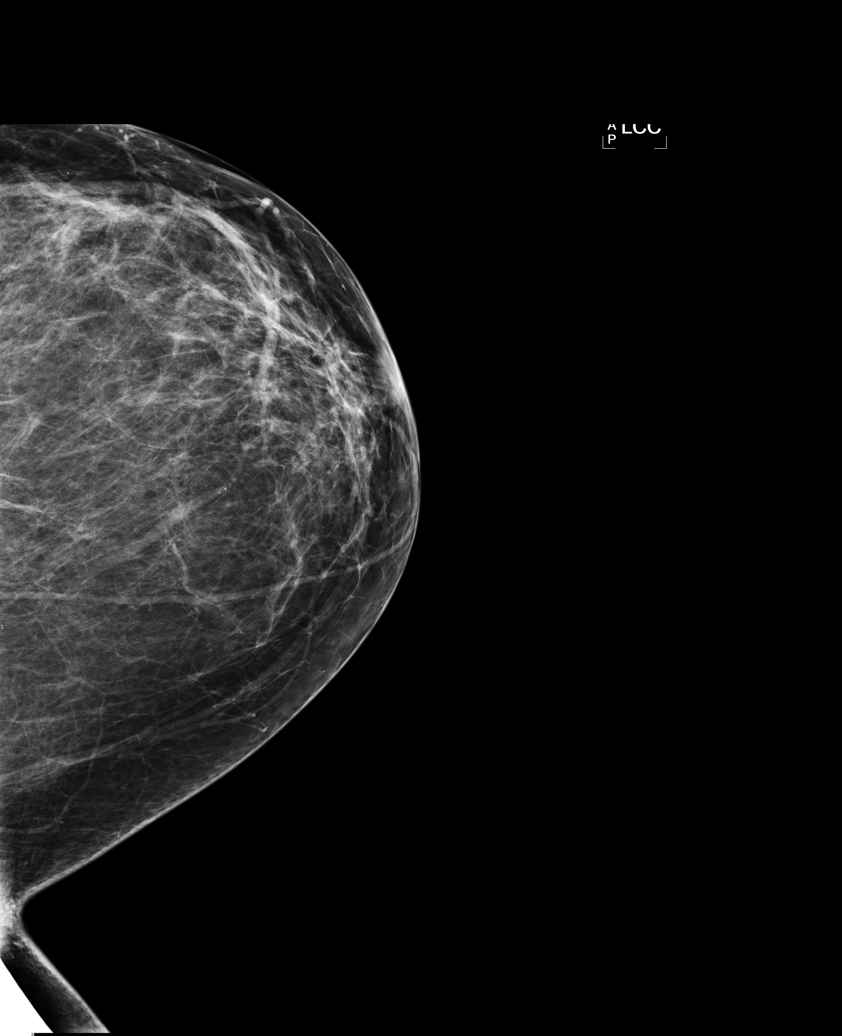

[L MLO]
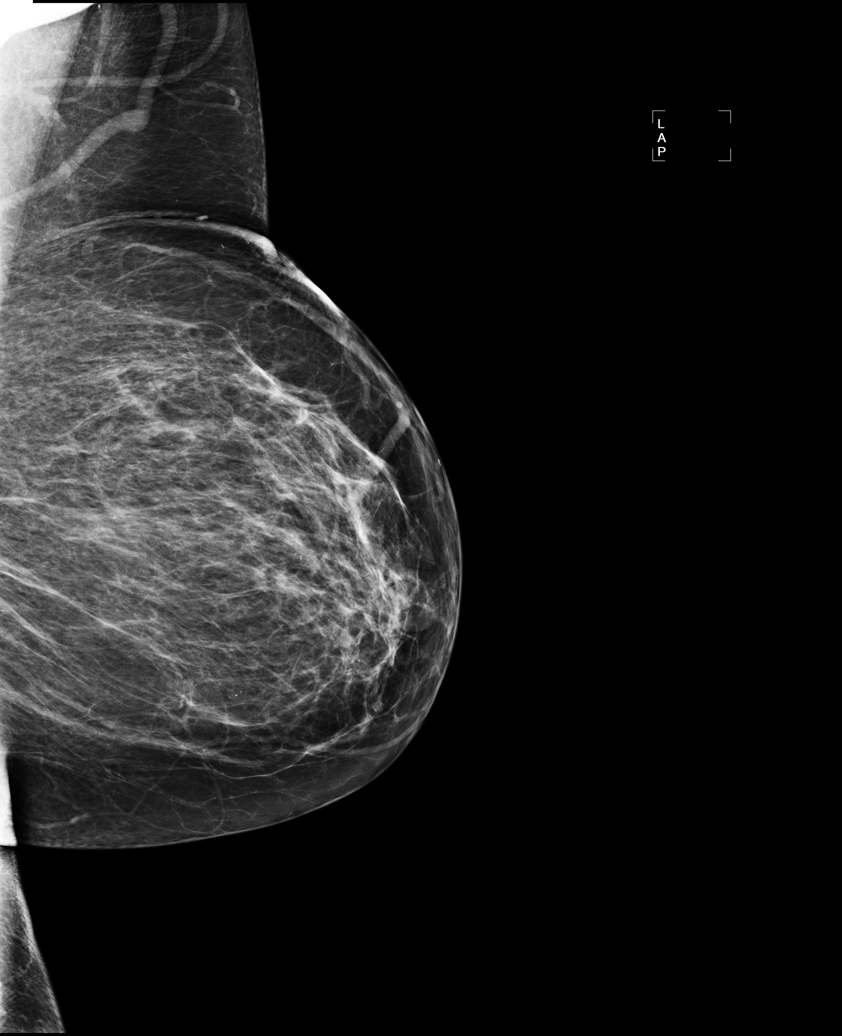

[R MLO]
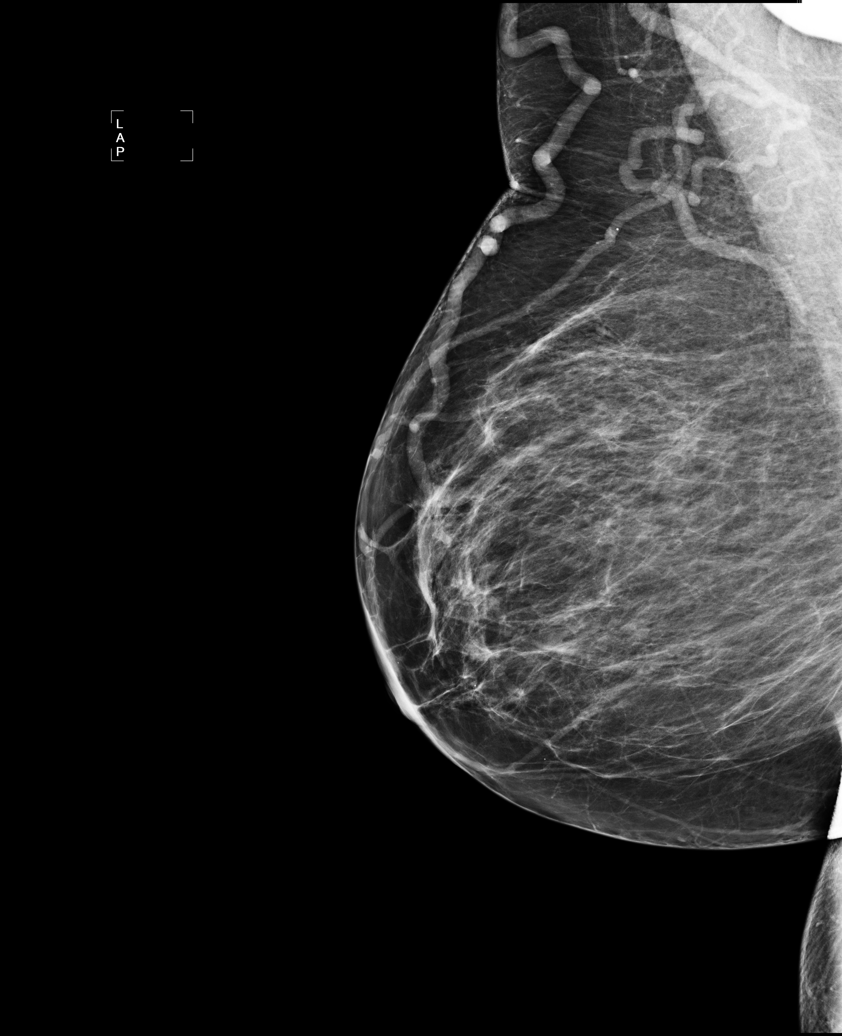

[L CC (2 of 2)]
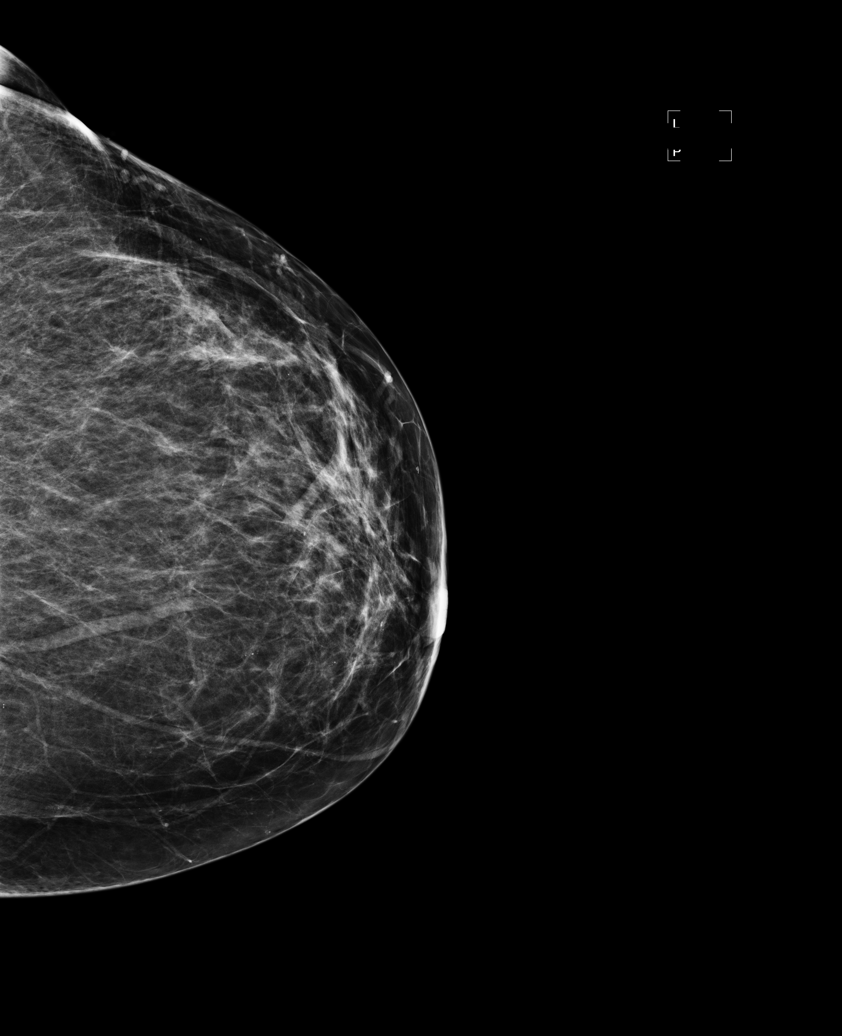

[5 of 5 positions shown; findings below may reference images not displayed]

IMPRESSION: No specific mammographic evidence of malignancy.  Next screening mammogram is recommended in one 
year.

A result letter of this screening mammogram will be mailed directly to the patient.

ASSESSMENT: Negative - BI-RADS 1

Screening mammogram in 1 year.
,

## 2011-08-21 ENCOUNTER — Ambulatory Visit (INDEPENDENT_AMBULATORY_CARE_PROVIDER_SITE_OTHER): Payer: Medicare Other

## 2011-08-21 DIAGNOSIS — J309 Allergic rhinitis, unspecified: Secondary | ICD-10-CM

## 2011-08-31 ENCOUNTER — Ambulatory Visit (INDEPENDENT_AMBULATORY_CARE_PROVIDER_SITE_OTHER): Payer: Medicare Other

## 2011-08-31 DIAGNOSIS — J309 Allergic rhinitis, unspecified: Secondary | ICD-10-CM

## 2011-09-07 ENCOUNTER — Ambulatory Visit (INDEPENDENT_AMBULATORY_CARE_PROVIDER_SITE_OTHER): Payer: Medicare Other

## 2011-09-07 DIAGNOSIS — J309 Allergic rhinitis, unspecified: Secondary | ICD-10-CM

## 2011-09-14 ENCOUNTER — Ambulatory Visit (INDEPENDENT_AMBULATORY_CARE_PROVIDER_SITE_OTHER): Payer: Medicare Other

## 2011-09-14 DIAGNOSIS — J309 Allergic rhinitis, unspecified: Secondary | ICD-10-CM

## 2011-09-18 ENCOUNTER — Ambulatory Visit (INDEPENDENT_AMBULATORY_CARE_PROVIDER_SITE_OTHER): Payer: Medicare Other

## 2011-09-18 DIAGNOSIS — J309 Allergic rhinitis, unspecified: Secondary | ICD-10-CM

## 2011-10-03 ENCOUNTER — Ambulatory Visit (INDEPENDENT_AMBULATORY_CARE_PROVIDER_SITE_OTHER): Payer: Medicare Other

## 2011-10-03 DIAGNOSIS — J309 Allergic rhinitis, unspecified: Secondary | ICD-10-CM

## 2011-10-18 ENCOUNTER — Ambulatory Visit (INDEPENDENT_AMBULATORY_CARE_PROVIDER_SITE_OTHER): Payer: Medicare Other

## 2011-10-18 DIAGNOSIS — J309 Allergic rhinitis, unspecified: Secondary | ICD-10-CM

## 2011-10-23 ENCOUNTER — Ambulatory Visit (INDEPENDENT_AMBULATORY_CARE_PROVIDER_SITE_OTHER): Payer: Medicare Other

## 2011-10-23 DIAGNOSIS — J309 Allergic rhinitis, unspecified: Secondary | ICD-10-CM

## 2011-11-05 ENCOUNTER — Encounter: Payer: Self-pay | Admitting: Internal Medicine

## 2011-11-07 ENCOUNTER — Ambulatory Visit (INDEPENDENT_AMBULATORY_CARE_PROVIDER_SITE_OTHER): Payer: Medicare Other

## 2011-11-07 DIAGNOSIS — J309 Allergic rhinitis, unspecified: Secondary | ICD-10-CM

## 2011-11-15 ENCOUNTER — Ambulatory Visit (INDEPENDENT_AMBULATORY_CARE_PROVIDER_SITE_OTHER): Payer: Medicare Other

## 2011-11-15 DIAGNOSIS — J309 Allergic rhinitis, unspecified: Secondary | ICD-10-CM

## 2011-12-06 DIAGNOSIS — L608 Other nail disorders: Secondary | ICD-10-CM | POA: Diagnosis not present

## 2011-12-06 DIAGNOSIS — E1149 Type 2 diabetes mellitus with other diabetic neurological complication: Secondary | ICD-10-CM | POA: Diagnosis not present

## 2011-12-07 ENCOUNTER — Ambulatory Visit (INDEPENDENT_AMBULATORY_CARE_PROVIDER_SITE_OTHER): Payer: Medicare Other

## 2011-12-07 DIAGNOSIS — J309 Allergic rhinitis, unspecified: Secondary | ICD-10-CM | POA: Diagnosis not present

## 2011-12-10 ENCOUNTER — Ambulatory Visit: Payer: Medicare Other | Admitting: Internal Medicine

## 2011-12-20 ENCOUNTER — Encounter: Payer: Self-pay | Admitting: Internal Medicine

## 2011-12-20 DIAGNOSIS — M76899 Other specified enthesopathies of unspecified lower limb, excluding foot: Secondary | ICD-10-CM | POA: Diagnosis not present

## 2011-12-20 DIAGNOSIS — IMO0002 Reserved for concepts with insufficient information to code with codable children: Secondary | ICD-10-CM | POA: Diagnosis not present

## 2011-12-20 DIAGNOSIS — M719 Bursopathy, unspecified: Secondary | ICD-10-CM | POA: Diagnosis not present

## 2011-12-20 DIAGNOSIS — M159 Polyosteoarthritis, unspecified: Secondary | ICD-10-CM | POA: Diagnosis not present

## 2011-12-20 DIAGNOSIS — M67919 Unspecified disorder of synovium and tendon, unspecified shoulder: Secondary | ICD-10-CM | POA: Diagnosis not present

## 2011-12-21 ENCOUNTER — Encounter: Payer: Self-pay | Admitting: Internal Medicine

## 2011-12-21 ENCOUNTER — Ambulatory Visit (INDEPENDENT_AMBULATORY_CARE_PROVIDER_SITE_OTHER): Payer: Medicare Other

## 2011-12-21 ENCOUNTER — Ambulatory Visit (INDEPENDENT_AMBULATORY_CARE_PROVIDER_SITE_OTHER): Payer: Medicare Other | Admitting: Internal Medicine

## 2011-12-21 VITALS — BP 140/60 | HR 69 | Ht 65.0 in | Wt 206.4 lb

## 2011-12-21 DIAGNOSIS — J301 Allergic rhinitis due to pollen: Secondary | ICD-10-CM

## 2011-12-21 DIAGNOSIS — G4730 Sleep apnea, unspecified: Secondary | ICD-10-CM | POA: Diagnosis not present

## 2011-12-21 DIAGNOSIS — J309 Allergic rhinitis, unspecified: Secondary | ICD-10-CM | POA: Diagnosis not present

## 2011-12-21 DIAGNOSIS — R404 Transient alteration of awareness: Secondary | ICD-10-CM | POA: Diagnosis not present

## 2011-12-21 MED ORDER — FLUTICASONE PROPIONATE 50 MCG/ACT NA SUSP: 2.0000 | Freq: Every day | NASAL | Status: DC

## 2011-12-21 NOTE — Patient Instructions (Addendum)
Refill flonase sent  Ok to continue flonase and Careers adviser. You can try skipping doses to see if you need them all the time.     Generic allegra is fexofenadine

## 2011-12-21 NOTE — Progress Notes (Signed)
10/19/12- 65 yoF never smoker, followed for OSA, chronic bronchitis LOV- 09/20/10 Since last here she had lumbar spine surgery and was at a nursing facility for rehabilitation. Back pain limits walking but she is doing better, using a cane or a walker now. Notices a persistent loose cough with clear sputum. At times she may cough until she retches. She does not feel significant postnasal drainage or reflux. Continues CPAP [employer] all night every night "I have to have it". She resumed allergy shots here and says they do help. 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  ROS-see HPI Constitutional:   No-   weight loss, night sweats, fevers, chills, fatigue, lassitude. HEENT:   No-  headaches, difficulty swallowing, tooth/dental problems, sore throat,       No-  sneezing, itching, ear ache, nasal congestion, post nasal drip,  CV:  No-   chest pain, orthopnea, PND, swelling in lower extremities, anasarca, dizziness, palpitations Resp: No-   shortness of breath with exertion or at rest.             +productive cough,  No non-productive cough,  No- coughing up of blood.              No-   change in color of mucus.  No- wheezing.   Skin: No-   rash or lesions. GI:  No-   heartburn, indigestion, abdominal pain, nausea, vomiting, diarrhea,                 change in bowel habits, loss of appetite GU:  MS:  No-   joint pain or swelling.  No- decreased range of motion.  No- back pain. Neuro-     nothing unusual Psych:  No- change in mood or affect. No depression or anxiety.  No memory loss.  OBJ- Physical Exam General- Alert, Oriented, Affect-appropriate, Distress- none acute, overweight Skin- rash-none, lesions- none, excoriation- none Lymphadenopathy- none Head- atraumatic            Eyes- Gross vision intact, PERRLA, conjunctivae and secretions clear            Ears- Hearing, canals-normal            Nose- Clear, no-Septal dev, mucus, polyps, erosion, perforation   Throat- Mallampati II-III , mucosa clear , drainage- none, tonsils- atrophic Neck- flexible , trachea midline, no stridor , thyroid nl, carotid no bruit Chest - symmetrical excursion , unlabored           Heart/CV- RRR , no murmur , no gallop  , no rub, nl s1 s2                           - JVD- none , edema- none, stasis changes- none, varices- none           Lung- clear to P&A, wheeze- none, cough- none , dullness-none, rub- none           Chest wall-  Abd-  Br/ Gen/ Rectal- Not done, not indicated Extrem- cyanosis- none, clubbing, none, atrophy- none, strength- nl Neuro- grossly intact to observation

## 2011-12-24 NOTE — Assessment & Plan Note (Signed)
Very good compliance and control. Further weight loss would help.

## 2011-12-24 NOTE — Assessment & Plan Note (Signed)
She is given reaction to continue allergy vaccine, building per protocol. We will refill her Flonase. She can try off and on Flonase and Allegra to see if they are needed.

## 2011-12-24 NOTE — Assessment & Plan Note (Signed)
This was a chronic complaint but seems to have faded in importance. She is not asking for alerting medications.

## 2012-01-01 ENCOUNTER — Ambulatory Visit (INDEPENDENT_AMBULATORY_CARE_PROVIDER_SITE_OTHER): Payer: Medicare Other

## 2012-01-01 DIAGNOSIS — M545 Low back pain, unspecified: Secondary | ICD-10-CM | POA: Diagnosis not present

## 2012-01-01 DIAGNOSIS — M4310 Spondylolisthesis, site unspecified: Secondary | ICD-10-CM | POA: Diagnosis not present

## 2012-01-01 DIAGNOSIS — M5126 Other intervertebral disc displacement, lumbar region: Secondary | ICD-10-CM | POA: Diagnosis not present

## 2012-01-01 DIAGNOSIS — J309 Allergic rhinitis, unspecified: Secondary | ICD-10-CM | POA: Diagnosis not present

## 2012-01-14 ENCOUNTER — Other Ambulatory Visit: Payer: Self-pay | Admitting: Neurosurgery

## 2012-01-14 DIAGNOSIS — M5126 Other intervertebral disc displacement, lumbar region: Secondary | ICD-10-CM

## 2012-01-14 DIAGNOSIS — M545 Low back pain: Secondary | ICD-10-CM

## 2012-01-14 DIAGNOSIS — M4310 Spondylolisthesis, site unspecified: Secondary | ICD-10-CM

## 2012-01-15 ENCOUNTER — Ambulatory Visit
Admission: RE | Admit: 2012-01-15 | Discharge: 2012-01-15 | Disposition: A | Discharge status: Home / Self Care | Payer: BLUE CROSS/BLUE SHIELD | Source: Ambulatory Visit | Attending: Neurosurgery | Admitting: Neurosurgery

## 2012-01-15 ENCOUNTER — Other Ambulatory Visit: Payer: BLUE CROSS/BLUE SHIELD

## 2012-01-15 ENCOUNTER — Other Ambulatory Visit: Payer: Medicare Other

## 2012-01-15 ENCOUNTER — Ambulatory Visit
Admission: RE | Admit: 2012-01-15 | Discharge: 2012-01-15 | Disposition: A | Discharge status: Home / Self Care | Payer: Medicare Other | Source: Ambulatory Visit | Attending: Neurosurgery | Admitting: Neurosurgery

## 2012-01-15 DIAGNOSIS — M4310 Spondylolisthesis, site unspecified: Secondary | ICD-10-CM | POA: Diagnosis not present

## 2012-01-15 DIAGNOSIS — M5126 Other intervertebral disc displacement, lumbar region: Secondary | ICD-10-CM

## 2012-01-15 DIAGNOSIS — M5137 Other intervertebral disc degeneration, lumbosacral region: Secondary | ICD-10-CM | POA: Diagnosis not present

## 2012-01-15 DIAGNOSIS — M545 Low back pain: Secondary | ICD-10-CM

## 2012-01-15 DIAGNOSIS — M47817 Spondylosis without myelopathy or radiculopathy, lumbosacral region: Secondary | ICD-10-CM | POA: Diagnosis not present

## 2012-01-15 MED ORDER — GADOBENATE DIMEGLUMINE 529 MG/ML IV SOLN
19.0000 mL | Freq: Once | INTRAVENOUS | Status: AC | PRN
  Administered 2012-01-15: 19 mL via INTRAVENOUS

## 2012-01-18 ENCOUNTER — Other Ambulatory Visit: Payer: Self-pay | Admitting: Neurosurgery

## 2012-01-18 DIAGNOSIS — M48061 Spinal stenosis, lumbar region without neurogenic claudication: Secondary | ICD-10-CM | POA: Diagnosis not present

## 2012-01-18 DIAGNOSIS — M5137 Other intervertebral disc degeneration, lumbosacral region: Secondary | ICD-10-CM | POA: Diagnosis not present

## 2012-01-18 DIAGNOSIS — M4310 Spondylolisthesis, site unspecified: Secondary | ICD-10-CM | POA: Diagnosis not present

## 2012-01-18 DIAGNOSIS — M47817 Spondylosis without myelopathy or radiculopathy, lumbosacral region: Secondary | ICD-10-CM | POA: Diagnosis not present

## 2012-01-21 ENCOUNTER — Encounter (HOSPITAL_COMMUNITY): Payer: Self-pay | Admitting: Pharmacy Technician

## 2012-01-23 ENCOUNTER — Encounter (HOSPITAL_COMMUNITY): Payer: Self-pay

## 2012-01-23 ENCOUNTER — Encounter (HOSPITAL_COMMUNITY)
Admission: RE | Admit: 2012-01-23 | Discharge: 2012-01-23 | Disposition: A | Discharge status: Home / Self Care | Payer: Medicare Other | Source: Ambulatory Visit | Attending: Neurosurgery | Admitting: Neurosurgery

## 2012-01-23 HISTORY — DX: Fibromyalgia: M79.7

## 2012-01-23 HISTORY — DX: Splenomegaly, not elsewhere classified: R16.1

## 2012-01-23 HISTORY — DX: Unspecified cirrhosis of liver: K74.60

## 2012-01-23 HISTORY — DX: Stress incontinence (female) (male): N39.3

## 2012-01-23 HISTORY — DX: Pure hypercholesterolemia, unspecified: E78.00

## 2012-01-23 HISTORY — DX: Nocturnal enuresis: N39.44

## 2012-01-23 HISTORY — DX: Cardiomegaly: I51.7

## 2012-01-23 HISTORY — DX: Unspecified osteoarthritis, unspecified site: M19.90

## 2012-01-23 HISTORY — DX: Adverse effect of unspecified anesthetic, initial encounter: T41.45XA

## 2012-01-23 HISTORY — DX: Gastro-esophageal reflux disease without esophagitis: K21.9

## 2012-01-23 HISTORY — DX: Acute myocardial infarction, unspecified: I21.9

## 2012-01-23 HISTORY — DX: Other complications of anesthesia, initial encounter: T88.59XA

## 2012-01-23 HISTORY — DX: Polyneuropathy, unspecified: G62.9

## 2012-01-23 LAB — CBC
HCT: 33.6 % — ABNORMAL LOW (ref 36.0–46.0)
Hemoglobin: 11.1 g/dL — ABNORMAL LOW (ref 12.0–15.0)
MCHC: 33 g/dL (ref 30.0–36.0)
MCV: 82 fL (ref 78.0–100.0)
RDW: 15.5 % (ref 11.5–15.5)

## 2012-01-23 LAB — COMPREHENSIVE METABOLIC PANEL
Albumin: 3.1 g/dL — ABNORMAL LOW (ref 3.5–5.2)
Alkaline Phosphatase: 142 U/L — ABNORMAL HIGH (ref 39–117)
BUN: 13 mg/dL (ref 6–23)
Chloride: 98 mEq/L (ref 96–112)
Creatinine, Ser: 0.91 mg/dL (ref 0.50–1.10)
GFR calc Af Amer: 75 mL/min — ABNORMAL LOW (ref >90)
Glucose, Bld: 132 mg/dL — ABNORMAL HIGH (ref 70–99)
Total Bilirubin: 0.6 mg/dL (ref 0.3–1.2)
Total Protein: 7 g/dL (ref 6.0–8.3)

## 2012-01-23 LAB — TYPE AND SCREEN
ABO/RH(D): A POS
Antibody Screen: NEGATIVE

## 2012-01-23 LAB — SURGICAL PCR SCREEN
MRSA, PCR: NEGATIVE
Staphylococcus aureus: NEGATIVE

## 2012-01-23 MED ORDER — VANCOMYCIN HCL IN DEXTROSE 1-5 GM/200ML-% IV SOLN
1000.0000 mg | INTRAVENOUS | Status: AC
  Administered 2012-01-24: 1000 mg via INTRAVENOUS
  Filled 2012-01-23: qty 200

## 2012-01-23 NOTE — Consult Note (Addendum)
Anesthesia:  Patient is a 66 year old female scheduled for a L5-S1 decompression with PLIF on 01/24/12.  Her PAT appointment was just this afternoon (01/23/12).    History includes OSA and chronic bronchitis (Dr. Jetty Duhamel), HTN, non-smoker, splenomegaly, non-alcoholic fatty liver and non-alcoholic cirrhosis (followed by GI Dr. Charm Barges in [place]), DM2, fibromyalgia, hypercholesterolemia, GERD, arthritis, anxiety, angina but with non-obstructive CAD according to her Cardiology records.  She also reported an MI in '99 due to "spasm" when I spoke with her on 04/03/11 before her  L4-5 decompression and arthrodesis.  Her Cardiologist is Dr. Norman Herrlich with [facility] in [place].  He last saw her in September 2012.  Per his note, her CAD and angina were "stable" and he did not think she needed further cardiovascular testing prior to her future back surgery unless follow-up EKG showed changes or she developed new symptoms.  (He was referring to an EKG done at [facility] just prior to her last Cardiology visit that I requested 1-2 hours ago but have yet to receive it.  If it does not come prior to surgery, then plan to do an EKG on arrival to ensure stability.)  Her EKG on 04/03/11 showed SB, LVH, nonspecific T wave abnormality.  According to her PAT nurse, her last cath was over 10 years ago.  An echo on 04/20/10 showed preserved LV systolic function, mild MR/TR, mildly enlarged LA, EF >55%.  Stress test on 05/04/10 showed no evidence of ischemia or significant scar.  Normal wall motion and contractility, EF 60%.  Since her last surgery, Ms. Chruch has been diagnosed with fatty liver disease and cirrhosis.  I contacted her Gastroenterologist's office [phone]) and spoke with Triage Nurse Dondra Spry.  I could not get her to fax me any information, but she did communicate results from Dr. Silvana Newness recent notes/tests.  She reports that Ms. Meenach was last seen on 09/13/11 with a final  impression of non-alcoholic fatty liver disease and non-alcoholic cirrhosis with her liver function well preserved without evidence of portal hypertension with MELD score of 7.  She had peripheral edema, but no evidence of ascites or significant blood abnormalities.  Her last labs there as of September showed a PLT count of 153 (previously 101-127) and normal PT/INR.  She also reported that viral studies were negative.  An EGD in September 2012 showed a normal esophagus, no varices, delayed gastric emptying.  Repeat EGD was recommended in two years. A  CT scan of the abdomen on 05/26/11 showed an unremarkable gallbladder, atherosclerosis, no bowel obstruction, no abdomen/pelvic ascites or lymphadenopathy, no intra or extra-hepatic ductal dilation, nodular hepatic contour suggestive of cirrhosis, spleen enlarged at 15.7cm, and a 5.1X 4.3 cm cystic lesion in left kidney w/o hydronephrosis.  Pancreas and adrenals were WNL.  CXR from 04/03/11 showed cardiomegaly without decompensation. Hyperaeration is likely  related to COPD.    Labs noted.  K 3.1  Alk Phos is elevated at 142, but AST/ALT, Bilirubin were WNL.  She will get a PT/PTT on arrival tomorrow.  H/H were 11.1/33.6.  PLT 122.  A T&S has already been done.  If her coags are reasonable and her follow-up EKG are stable then anticipate she can proceed.  I reviewed above with Anesthesiologist Dr. Noreene Larsson who agrees with this plan.

## 2012-01-23 NOTE — Pre-Procedure Instructions (Signed)
20 RADHIKA DERSHEM  01/23/2012   Your procedure is scheduled on:  Thursday, March 7  Report to [facility] at 0530 AM.  Call this number if you have problems the morning of surgery: [phone]   Remember:   Do not eat food:After Midnight.  May have clear liquids: up to 4 Hours before arrival.  Clear liquids include soda, tea, black coffee, apple or grape juice, broth.  Take these medicines the morning of surgery with A SIP OF WATER: *Diltiazem,Metoprolol,Isosorbide,Gabapentin,Prevacid,Wellbutrin, Effexor**   Do not wear jewelry, make-up or nail polish.  Do not wear lotions, powders, or perfumes. You may wear deodorant.  Do not shave 48 hours prior to surgery.  Do not bring valuables to the hospital.  Contacts, dentures or bridgework may not be worn into surgery.  Leave suitcase in the car. After surgery it may be brought to your room.  For patients admitted to the hospital, checkout time is 11:00 AM the day of discharge. Bring C PAP mask     Special Instructions: CHG Shower Use Special Wash: 1/2 bottle night before surgery and 1/2 bottle morning of surgery.   Please read over the following fact sheets that you were given: Pain Booklet, Coughing and Deep Breathing, Blood Transfusion Information, MRSA Information and Surgical Site Infection Prevention

## 2012-01-24 ENCOUNTER — Inpatient Hospital Stay (HOSPITAL_COMMUNITY): Payer: Medicare Other

## 2012-01-24 ENCOUNTER — Inpatient Hospital Stay (HOSPITAL_COMMUNITY)
Admission: RE | Admit: 2012-01-24 | Discharge: 2012-02-05 | DRG: 460 | Disposition: A | Discharge status: Skilled Nursing Facility | Payer: Medicare Other | Source: Ambulatory Visit | Attending: Neurosurgery | Admitting: Neurosurgery

## 2012-01-24 ENCOUNTER — Inpatient Hospital Stay (HOSPITAL_COMMUNITY): Payer: Medicare Other | Admitting: Vascular Surgery

## 2012-01-24 ENCOUNTER — Encounter (HOSPITAL_COMMUNITY): Payer: Self-pay | Admitting: Surgery

## 2012-01-24 ENCOUNTER — Other Ambulatory Visit: Payer: Self-pay

## 2012-01-24 ENCOUNTER — Encounter (HOSPITAL_COMMUNITY)
Admission: RE | Disposition: A | Discharge status: Skilled Nursing Facility | Payer: Self-pay | Source: Ambulatory Visit | Attending: Neurosurgery

## 2012-01-24 ENCOUNTER — Encounter (HOSPITAL_COMMUNITY): Payer: Self-pay | Admitting: Vascular Surgery

## 2012-01-24 DIAGNOSIS — K219 Gastro-esophageal reflux disease without esophagitis: Secondary | ICD-10-CM | POA: Diagnosis present

## 2012-01-24 DIAGNOSIS — M4310 Spondylolisthesis, site unspecified: Secondary | ICD-10-CM | POA: Diagnosis not present

## 2012-01-24 DIAGNOSIS — I10 Essential (primary) hypertension: Secondary | ICD-10-CM | POA: Diagnosis present

## 2012-01-24 DIAGNOSIS — M545 Low back pain: Secondary | ICD-10-CM | POA: Diagnosis not present

## 2012-01-24 DIAGNOSIS — Q762 Congenital spondylolisthesis: Secondary | ICD-10-CM | POA: Diagnosis not present

## 2012-01-24 DIAGNOSIS — E119 Type 2 diabetes mellitus without complications: Secondary | ICD-10-CM | POA: Diagnosis present

## 2012-01-24 DIAGNOSIS — M51379 Other intervertebral disc degeneration, lumbosacral region without mention of lumbar back pain or lower extremity pain: Secondary | ICD-10-CM | POA: Diagnosis present

## 2012-01-24 DIAGNOSIS — Z9849 Cataract extraction status, unspecified eye: Secondary | ICD-10-CM

## 2012-01-24 DIAGNOSIS — Z882 Allergy status to sulfonamides status: Secondary | ICD-10-CM

## 2012-01-24 DIAGNOSIS — J189 Pneumonia, unspecified organism: Secondary | ICD-10-CM | POA: Diagnosis not present

## 2012-01-24 DIAGNOSIS — J984 Other disorders of lung: Secondary | ICD-10-CM | POA: Diagnosis not present

## 2012-01-24 DIAGNOSIS — M47817 Spondylosis without myelopathy or radiculopathy, lumbosacral region: Secondary | ICD-10-CM | POA: Diagnosis not present

## 2012-01-24 DIAGNOSIS — Z7982 Long term (current) use of aspirin: Secondary | ICD-10-CM | POA: Diagnosis not present

## 2012-01-24 DIAGNOSIS — I509 Heart failure, unspecified: Secondary | ICD-10-CM | POA: Diagnosis present

## 2012-01-24 DIAGNOSIS — F411 Generalized anxiety disorder: Secondary | ICD-10-CM | POA: Diagnosis present

## 2012-01-24 DIAGNOSIS — G4733 Obstructive sleep apnea (adult) (pediatric): Secondary | ICD-10-CM | POA: Diagnosis present

## 2012-01-24 DIAGNOSIS — M5137 Other intervertebral disc degeneration, lumbosacral region: Secondary | ICD-10-CM | POA: Diagnosis present

## 2012-01-24 DIAGNOSIS — IMO0002 Reserved for concepts with insufficient information to code with codable children: Secondary | ICD-10-CM | POA: Diagnosis not present

## 2012-01-24 DIAGNOSIS — Z79899 Other long term (current) drug therapy: Secondary | ICD-10-CM | POA: Diagnosis not present

## 2012-01-24 DIAGNOSIS — Z888 Allergy status to other drugs, medicaments and biological substances status: Secondary | ICD-10-CM | POA: Diagnosis not present

## 2012-01-24 DIAGNOSIS — M6250 Muscle wasting and atrophy, not elsewhere classified, unspecified site: Secondary | ICD-10-CM | POA: Diagnosis not present

## 2012-01-24 DIAGNOSIS — Z5189 Encounter for other specified aftercare: Secondary | ICD-10-CM | POA: Diagnosis not present

## 2012-01-24 DIAGNOSIS — F3289 Other specified depressive episodes: Secondary | ICD-10-CM | POA: Diagnosis present

## 2012-01-24 DIAGNOSIS — F329 Major depressive disorder, single episode, unspecified: Secondary | ICD-10-CM | POA: Diagnosis present

## 2012-01-24 DIAGNOSIS — I252 Old myocardial infarction: Secondary | ICD-10-CM | POA: Diagnosis not present

## 2012-01-24 DIAGNOSIS — N393 Stress incontinence (female) (male): Secondary | ICD-10-CM | POA: Diagnosis present

## 2012-01-24 DIAGNOSIS — R262 Difficulty in walking, not elsewhere classified: Secondary | ICD-10-CM | POA: Diagnosis not present

## 2012-01-24 DIAGNOSIS — M519 Unspecified thoracic, thoracolumbar and lumbosacral intervertebral disc disorder: Secondary | ICD-10-CM | POA: Diagnosis not present

## 2012-01-24 DIAGNOSIS — IMO0001 Reserved for inherently not codable concepts without codable children: Secondary | ICD-10-CM | POA: Diagnosis present

## 2012-01-24 DIAGNOSIS — M549 Dorsalgia, unspecified: Secondary | ICD-10-CM | POA: Diagnosis not present

## 2012-01-24 DIAGNOSIS — J309 Allergic rhinitis, unspecified: Secondary | ICD-10-CM | POA: Diagnosis present

## 2012-01-24 DIAGNOSIS — Z4789 Encounter for other orthopedic aftercare: Secondary | ICD-10-CM | POA: Diagnosis not present

## 2012-01-24 DIAGNOSIS — R509 Fever, unspecified: Secondary | ICD-10-CM | POA: Diagnosis not present

## 2012-01-24 DIAGNOSIS — Z01812 Encounter for preprocedural laboratory examination: Secondary | ICD-10-CM | POA: Diagnosis not present

## 2012-01-24 DIAGNOSIS — M6281 Muscle weakness (generalized): Secondary | ICD-10-CM | POA: Diagnosis not present

## 2012-01-24 DIAGNOSIS — M1990 Unspecified osteoarthritis, unspecified site: Secondary | ICD-10-CM | POA: Diagnosis not present

## 2012-01-24 DIAGNOSIS — M48061 Spinal stenosis, lumbar region without neurogenic claudication: Secondary | ICD-10-CM | POA: Diagnosis not present

## 2012-01-24 HISTORY — PX: POSTERIOR LUMBAR FUSION: SHX6036

## 2012-01-24 LAB — GLUCOSE, CAPILLARY
Glucose-Capillary: 187 mg/dL — ABNORMAL HIGH (ref 70–99)
Glucose-Capillary: 138 mg/dL — ABNORMAL HIGH (ref 70–99)
Glucose-Capillary: 124 mg/dL — ABNORMAL HIGH (ref 70–99)
Glucose-Capillary: 129 mg/dL — ABNORMAL HIGH (ref 70–99)

## 2012-01-24 LAB — PROTIME-INR: Prothrombin Time: 15.7 seconds — ABNORMAL HIGH (ref 11.6–15.2)

## 2012-01-24 SURGERY — POSTERIOR LUMBAR FUSION 1 LEVEL
Anesthesia: General | Site: Back | Wound class: Clean

## 2012-01-24 MED ORDER — ACETAMINOPHEN 650 MG RE SUPP: 650.0000 mg | RECTAL | Status: DC | PRN

## 2012-01-24 MED ORDER — HYDROCODONE-ACETAMINOPHEN 5-325 MG PO TABS
1.0000 | ORAL_TABLET | ORAL | Status: DC | PRN
  Administered 2012-01-24: 2 via ORAL
  Administered 2012-01-26: 1 via ORAL
  Administered 2012-01-31 – 2012-02-02 (×8): 2 via ORAL
  Filled 2012-01-24 (×10): qty 2
  Filled 2012-01-24: qty 1

## 2012-01-24 MED ORDER — GENTAMICIN IN SALINE 1.6-0.9 MG/ML-% IV SOLN
INTRAVENOUS | Status: DC | PRN
  Administered 2012-01-24: 80 mg via INTRAVENOUS

## 2012-01-24 MED ORDER — LIDOCAINE-EPINEPHRINE 1 %-1:100000 IJ SOLN
INTRAMUSCULAR | Status: DC | PRN
  Administered 2012-01-24: 15 mL

## 2012-01-24 MED ORDER — MENTHOL 3 MG MT LOZG
1.0000 | LOZENGE | OROMUCOSAL | Status: DC | PRN
  Filled 2012-01-24: qty 9

## 2012-01-24 MED ORDER — INSULIN ASPART 100 UNIT/ML ~~LOC~~ SOLN
0.0000 [IU] | Freq: Three times a day (TID) | SUBCUTANEOUS | Status: DC
  Administered 2012-01-24: 3 [IU] via SUBCUTANEOUS
  Administered 2012-01-25: 2 [IU] via SUBCUTANEOUS
  Administered 2012-01-25 – 2012-01-26 (×5): 3 [IU] via SUBCUTANEOUS
  Administered 2012-01-27: 2 [IU] via SUBCUTANEOUS
  Administered 2012-01-27 (×2): 3 [IU] via SUBCUTANEOUS
  Administered 2012-01-28 – 2012-02-05 (×10): 2 [IU] via SUBCUTANEOUS
  Filled 2012-01-24: qty 0.15
  Filled 2012-01-24: qty 3

## 2012-01-24 MED ORDER — GENTAMICIN IN SALINE 1.6-0.9 MG/ML-% IV SOLN
80.0000 mg | INTRAVENOUS | Status: DC
  Filled 2012-01-24: qty 50

## 2012-01-24 MED ORDER — ZOLPIDEM TARTRATE 5 MG PO TABS: 5.0000 mg | ORAL_TABLET | Freq: Every evening | ORAL | Status: DC | PRN

## 2012-01-24 MED ORDER — FLUTICASONE PROPIONATE 50 MCG/ACT NA SUSP
2.0000 | Freq: Every day | NASAL | Status: DC | PRN
  Filled 2012-01-24: qty 16

## 2012-01-24 MED ORDER — PANTOPRAZOLE SODIUM 40 MG PO TBEC
40.0000 mg | DELAYED_RELEASE_TABLET | Freq: Every day | ORAL | Status: DC
  Administered 2012-01-24 – 2012-02-05 (×13): 40 mg via ORAL
  Filled 2012-01-24 (×11): qty 1

## 2012-01-24 MED ORDER — GLYCOPYRROLATE 0.2 MG/ML IJ SOLN
INTRAMUSCULAR | Status: DC | PRN
  Administered 2012-01-24: 0.6 mg via INTRAVENOUS

## 2012-01-24 MED ORDER — IRBESARTAN 150 MG PO TABS
150.0000 mg | ORAL_TABLET | Freq: Every day | ORAL | Status: DC
  Administered 2012-01-24 – 2012-02-05 (×13): 150 mg via ORAL
  Filled 2012-01-24 (×13): qty 1

## 2012-01-24 MED ORDER — ATORVASTATIN CALCIUM 20 MG PO TABS
20.0000 mg | ORAL_TABLET | Freq: Every day | ORAL | Status: DC
  Administered 2012-01-24 – 2012-02-05 (×12): 20 mg via ORAL
  Filled 2012-01-24 (×13): qty 1

## 2012-01-24 MED ORDER — MORPHINE SULFATE 4 MG/ML IJ SOLN
4.0000 mg | INTRAMUSCULAR | Status: DC | PRN
  Administered 2012-01-24 – 2012-01-28 (×9): 4 mg via INTRAMUSCULAR
  Administered 2012-01-29 – 2012-01-30 (×2): 8 mg via INTRAMUSCULAR
  Filled 2012-01-24: qty 2
  Filled 2012-01-24 (×6): qty 1
  Filled 2012-01-24: qty 2
  Filled 2012-01-24 (×4): qty 1

## 2012-01-24 MED ORDER — BISACODYL 10 MG RE SUPP: 10.0000 mg | Freq: Every day | RECTAL | Status: DC | PRN

## 2012-01-24 MED ORDER — BUPIVACAINE HCL (PF) 0.5 % IJ SOLN
INTRAMUSCULAR | Status: DC | PRN
  Administered 2012-01-24: 15 mL

## 2012-01-24 MED ORDER — POTASSIUM CHLORIDE CRYS ER 20 MEQ PO TBCR
20.0000 meq | EXTENDED_RELEASE_TABLET | Freq: Two times a day (BID) | ORAL | Status: DC
  Administered 2012-01-24 – 2012-02-05 (×24): 20 meq via ORAL
  Filled 2012-01-24 (×25): qty 1

## 2012-01-24 MED ORDER — ACETAMINOPHEN 325 MG PO TABS
650.0000 mg | ORAL_TABLET | ORAL | Status: DC | PRN
  Administered 2012-02-04: 325 mg via ORAL
  Filled 2012-01-24: qty 1
  Filled 2012-01-24: qty 2

## 2012-01-24 MED ORDER — HEMOSTATIC AGENTS (NO CHARGE) OPTIME
TOPICAL | Status: DC | PRN
  Administered 2012-01-24: 1 via TOPICAL

## 2012-01-24 MED ORDER — SODIUM CHLORIDE 0.9 % IJ SOLN
3.0000 mL | Freq: Two times a day (BID) | INTRAMUSCULAR | Status: DC
  Administered 2012-01-24 – 2012-02-05 (×21): 3 mL via INTRAVENOUS

## 2012-01-24 MED ORDER — VALSARTAN-HYDROCHLOROTHIAZIDE 160-12.5 MG PO TABS: 2.0000 | ORAL_TABLET | Freq: Every day | ORAL | Status: DC

## 2012-01-24 MED ORDER — LACTATED RINGERS IV SOLN
INTRAVENOUS | Status: DC | PRN
  Administered 2012-01-24 (×2): via INTRAVENOUS

## 2012-01-24 MED ORDER — EZETIMIBE 10 MG PO TABS
5.0000 mg | ORAL_TABLET | Freq: Every day | ORAL | Status: DC
  Administered 2012-01-24: 5 mg via ORAL
  Administered 2012-01-25: 10 mg via ORAL
  Administered 2012-01-26 – 2012-02-04 (×10): 5 mg via ORAL
  Filled 2012-01-24 (×13): qty 0.5

## 2012-01-24 MED ORDER — METOPROLOL TARTRATE 50 MG PO TABS
50.0000 mg | ORAL_TABLET | Freq: Two times a day (BID) | ORAL | Status: DC
  Administered 2012-01-25 – 2012-02-05 (×23): 50 mg via ORAL
  Filled 2012-01-24 (×26): qty 1

## 2012-01-24 MED ORDER — FUROSEMIDE 20 MG PO TABS
20.0000 mg | ORAL_TABLET | Freq: Every day | ORAL | Status: DC
  Administered 2012-01-24 – 2012-02-05 (×13): 20 mg via ORAL
  Filled 2012-01-24 (×13): qty 1

## 2012-01-24 MED ORDER — SODIUM CHLORIDE 0.9 % IV SOLN
INTRAVENOUS | Status: AC
  Filled 2012-01-24: qty 500

## 2012-01-24 MED ORDER — ALUM & MAG HYDROXIDE-SIMETH 200-200-20 MG/5ML PO SUSP: 30.0000 mL | Freq: Four times a day (QID) | ORAL | Status: DC | PRN

## 2012-01-24 MED ORDER — PHENOL 1.4 % MT LIQD
1.0000 | OROMUCOSAL | Status: DC | PRN
  Filled 2012-01-24: qty 177

## 2012-01-24 MED ORDER — ALBUMIN HUMAN 5 % IV SOLN
INTRAVENOUS | Status: DC | PRN
  Administered 2012-01-24: 10:00:00 via INTRAVENOUS

## 2012-01-24 MED ORDER — OXYCODONE-ACETAMINOPHEN 5-325 MG PO TABS
1.0000 | ORAL_TABLET | ORAL | Status: DC | PRN
  Administered 2012-01-25 – 2012-02-03 (×5): 2 via ORAL
  Administered 2012-02-03: 1 via ORAL
  Administered 2012-02-03 – 2012-02-04 (×3): 2 via ORAL
  Administered 2012-02-04: 1 via ORAL
  Administered 2012-02-04 – 2012-02-05 (×5): 2 via ORAL
  Filled 2012-01-24 (×2): qty 2
  Filled 2012-01-24: qty 1
  Filled 2012-01-24 (×3): qty 2
  Filled 2012-01-24: qty 1
  Filled 2012-01-24 (×8): qty 2

## 2012-01-24 MED ORDER — KETOROLAC TROMETHAMINE 30 MG/ML IJ SOLN: 30.0000 mg | Freq: Once | INTRAMUSCULAR | Status: DC

## 2012-01-24 MED ORDER — PHENYLEPHRINE HCL 10 MG/ML IJ SOLN
INTRAMUSCULAR | Status: DC | PRN
  Administered 2012-01-24 (×2): 40 ug via INTRAVENOUS
  Administered 2012-01-24 (×2): 80 ug via INTRAVENOUS

## 2012-01-24 MED ORDER — ISOSORBIDE MONONITRATE ER 60 MG PO TB24
60.0000 mg | ORAL_TABLET | Freq: Every day | ORAL | Status: DC
  Administered 2012-01-24 – 2012-02-05 (×13): 60 mg via ORAL
  Filled 2012-01-24 (×13): qty 1

## 2012-01-24 MED ORDER — GABAPENTIN 300 MG PO CAPS
300.0000 mg | ORAL_CAPSULE | Freq: Every day | ORAL | Status: DC
  Administered 2012-01-24 – 2012-02-04 (×12): 300 mg via ORAL
  Filled 2012-01-24 (×14): qty 1

## 2012-01-24 MED ORDER — HYDROXYZINE HCL 25 MG PO TABS
50.0000 mg | ORAL_TABLET | ORAL | Status: DC | PRN
  Administered 2012-01-29: 50 mg via ORAL
  Filled 2012-01-24: qty 2

## 2012-01-24 MED ORDER — HYDROMORPHONE HCL PF 1 MG/ML IJ SOLN
INTRAMUSCULAR | Status: AC
  Filled 2012-01-24: qty 1

## 2012-01-24 MED ORDER — GABAPENTIN 100 MG PO CAPS
200.0000 mg | ORAL_CAPSULE | Freq: Every day | ORAL | Status: DC
  Administered 2012-01-25 – 2012-02-05 (×12): 200 mg via ORAL
  Filled 2012-01-24 (×12): qty 2

## 2012-01-24 MED ORDER — HYDROXYZINE HCL 50 MG/ML IM SOLN
50.0000 mg | INTRAMUSCULAR | Status: DC | PRN
  Administered 2012-02-02: 50 mg via INTRAMUSCULAR
  Filled 2012-01-24: qty 1

## 2012-01-24 MED ORDER — MAGNESIUM HYDROXIDE 400 MG/5ML PO SUSP: 30.0000 mL | Freq: Every day | ORAL | Status: DC | PRN

## 2012-01-24 MED ORDER — CYCLOBENZAPRINE HCL 10 MG PO TABS
10.0000 mg | ORAL_TABLET | Freq: Three times a day (TID) | ORAL | Status: DC | PRN
  Administered 2012-01-25 – 2012-02-03 (×14): 10 mg via ORAL
  Filled 2012-01-24 (×7): qty 1
  Filled 2012-01-24: qty 2
  Filled 2012-01-24 (×5): qty 1

## 2012-01-24 MED ORDER — PROPOFOL 10 MG/ML IV EMUL
INTRAVENOUS | Status: DC | PRN
  Administered 2012-01-24: 140 mg via INTRAVENOUS

## 2012-01-24 MED ORDER — GENTAMICIN SULFATE 40 MG/ML IJ SOLN: INTRAVENOUS | Status: DC | PRN

## 2012-01-24 MED ORDER — HYDROMORPHONE HCL PF 1 MG/ML IJ SOLN
0.2500 mg | INTRAMUSCULAR | Status: DC | PRN
Start: 2012-01-24 — End: 2012-01-24
  Administered 2012-01-24 (×2): 0.5 mg via INTRAVENOUS

## 2012-01-24 MED ORDER — POTASSIUM CHLORIDE IN NACL 40-0.9 MEQ/L-% IV SOLN
INTRAVENOUS | Status: DC
  Administered 2012-01-24 – 2012-01-25 (×2): via INTRAVENOUS
  Filled 2012-01-24 (×31): qty 1000

## 2012-01-24 MED ORDER — NEOSTIGMINE METHYLSULFATE 1 MG/ML IJ SOLN
INTRAMUSCULAR | Status: DC | PRN
  Administered 2012-01-24: 4 mg via INTRAVENOUS

## 2012-01-24 MED ORDER — ONDANSETRON HCL 4 MG/2ML IJ SOLN: 4.0000 mg | Freq: Once | INTRAMUSCULAR | Status: DC | PRN

## 2012-01-24 MED ORDER — HYDROCHLOROTHIAZIDE 12.5 MG PO CAPS
12.5000 mg | ORAL_CAPSULE | Freq: Every day | ORAL | Status: DC
  Administered 2012-01-24 – 2012-02-05 (×13): 12.5 mg via ORAL
  Filled 2012-01-24 (×13): qty 1

## 2012-01-24 MED ORDER — ONDANSETRON HCL 4 MG/2ML IJ SOLN
INTRAMUSCULAR | Status: DC | PRN
  Administered 2012-01-24: 4 mg via INTRAVENOUS

## 2012-01-24 MED ORDER — INSULIN ASPART 100 UNIT/ML ~~LOC~~ SOLN: 0.0000 [IU] | Freq: Every day | SUBCUTANEOUS | Status: DC

## 2012-01-24 MED ORDER — THROMBIN 20000 UNITS EX KIT
PACK | CUTANEOUS | Status: DC | PRN
  Administered 2012-01-24: 20000 [IU] via TOPICAL

## 2012-01-24 MED ORDER — FENTANYL CITRATE 0.05 MG/ML IJ SOLN
INTRAMUSCULAR | Status: DC | PRN
  Administered 2012-01-24 (×2): 50 ug via INTRAVENOUS
  Administered 2012-01-24: 150 ug via INTRAVENOUS

## 2012-01-24 MED ORDER — DILTIAZEM HCL ER 180 MG PO CP24
180.0000 mg | ORAL_CAPSULE | Freq: Every day | ORAL | Status: DC
  Administered 2012-01-25 – 2012-02-04 (×11): 180 mg via ORAL
  Filled 2012-01-24 (×14): qty 1

## 2012-01-24 MED ORDER — SODIUM CHLORIDE 0.9 % IR SOLN
Status: DC | PRN
  Administered 2012-01-24: 08:00:00

## 2012-01-24 MED ORDER — SODIUM CHLORIDE 0.9 % IV SOLN
INTRAVENOUS | Status: DC | PRN
  Administered 2012-01-24: 08:00:00 via INTRAVENOUS

## 2012-01-24 MED ORDER — VENLAFAXINE HCL 50 MG PO TABS
50.0000 mg | ORAL_TABLET | Freq: Every day | ORAL | Status: DC
  Administered 2012-01-24 – 2012-02-05 (×13): 50 mg via ORAL
  Filled 2012-01-24 (×13): qty 1

## 2012-01-24 MED ORDER — EXENATIDE ER 2 MG ~~LOC~~ SUSR
2.0000 mg | SUBCUTANEOUS | Status: DC
  Filled 2012-01-24: qty 1

## 2012-01-24 MED ORDER — KETOROLAC TROMETHAMINE 30 MG/ML IJ SOLN
30.0000 mg | Freq: Four times a day (QID) | INTRAMUSCULAR | Status: AC
  Administered 2012-01-24 – 2012-01-26 (×7): 30 mg via INTRAVENOUS
  Filled 2012-01-24 (×8): qty 1

## 2012-01-24 MED ORDER — BACITRACIN 50000 UNITS IM SOLR
INTRAMUSCULAR | Status: AC
  Filled 2012-01-24: qty 1

## 2012-01-24 MED ORDER — SODIUM CHLORIDE 0.9 % IV SOLN: 250.0000 mL | INTRAVENOUS | Status: DC

## 2012-01-24 MED ORDER — SODIUM CHLORIDE 0.9 % IJ SOLN: 3.0000 mL | INTRAMUSCULAR | Status: DC | PRN

## 2012-01-24 MED ORDER — 0.9 % SODIUM CHLORIDE (POUR BTL) OPTIME
TOPICAL | Status: DC | PRN
  Administered 2012-01-24: 1000 mL

## 2012-01-24 MED ORDER — BUPROPION HCL ER (XL) 300 MG PO TB24
300.0000 mg | ORAL_TABLET | Freq: Every day | ORAL | Status: DC
  Administered 2012-01-24 – 2012-02-05 (×13): 300 mg via ORAL
  Filled 2012-01-24 (×14): qty 1

## 2012-01-24 MED ORDER — ROCURONIUM BROMIDE 100 MG/10ML IV SOLN
INTRAVENOUS | Status: DC | PRN
  Administered 2012-01-24: 50 mg via INTRAVENOUS
  Administered 2012-01-24 (×2): 10 mg via INTRAVENOUS
  Administered 2012-01-24: 20 mg via INTRAVENOUS

## 2012-01-24 SURGICAL SUPPLY — 76 items
BAG DECANTER FOR FLEXI CONT (MISCELLANEOUS) ×2 IMPLANT
BLADE SURG ROTATE 9660 (MISCELLANEOUS) IMPLANT
BRUSH SCRUB EZ PLAIN DRY (MISCELLANEOUS) ×2 IMPLANT
BUR ACRON 5.0MM COATED (BURR) ×2 IMPLANT
BUR MATCHSTICK NEURO 3.0 LAGG (BURR) ×6 IMPLANT
CANISTER SUCTION 2500CC (MISCELLANEOUS) ×2 IMPLANT
CAP LCK SPNE (Orthopedic Implant) ×6 IMPLANT
CAP LOCK SPINE RADIUS (Orthopedic Implant) ×6 IMPLANT
CAP LOCKING (Orthopedic Implant) ×6 IMPLANT
CLOTH BEACON ORANGE TIMEOUT ST (SAFETY) ×2 IMPLANT
CONT SPEC 4OZ CLIKSEAL STRL BL (MISCELLANEOUS) ×2 IMPLANT
COVER BACK TABLE 24X17X13 BIG (DRAPES) IMPLANT
COVER TABLE BACK 60X90 (DRAPES) ×2 IMPLANT
DERMABOND ADHESIVE PROPEN (GAUZE/BANDAGES/DRESSINGS) ×1
DERMABOND ADVANCED (GAUZE/BANDAGES/DRESSINGS) ×2
DERMABOND ADVANCED .7 DNX12 (GAUZE/BANDAGES/DRESSINGS) ×2 IMPLANT
DERMABOND ADVANCED .7 DNX6 (GAUZE/BANDAGES/DRESSINGS) ×1 IMPLANT
DRAPE C-ARM 42X72 X-RAY (DRAPES) ×4 IMPLANT
DRAPE LAPAROTOMY 100X72X124 (DRAPES) ×2 IMPLANT
DRAPE POUCH INSTRU U-SHP 10X18 (DRAPES) ×2 IMPLANT
DRAPE PROXIMA HALF (DRAPES) IMPLANT
DRSG EMULSION OIL 3X3 NADH (GAUZE/BANDAGES/DRESSINGS) IMPLANT
ELECT REM PT RETURN 9FT ADLT (ELECTROSURGICAL) ×2
ELECTRODE REM PT RTRN 9FT ADLT (ELECTROSURGICAL) ×1 IMPLANT
GAUZE SPONGE 4X4 12PLY STRL LF (GAUZE/BANDAGES/DRESSINGS) ×2 IMPLANT
GAUZE SPONGE 4X4 16PLY XRAY LF (GAUZE/BANDAGES/DRESSINGS) ×2 IMPLANT
GLOVE BIOGEL PI IND STRL 8 (GLOVE) ×2 IMPLANT
GLOVE BIOGEL PI INDICATOR 8 (GLOVE) ×2
GLOVE ECLIPSE 7.5 STRL STRAW (GLOVE) ×4 IMPLANT
GLOVE EXAM NITRILE LRG STRL (GLOVE) IMPLANT
GLOVE EXAM NITRILE MD LF STRL (GLOVE) ×4 IMPLANT
GLOVE EXAM NITRILE XL STR (GLOVE) IMPLANT
GLOVE EXAM NITRILE XS STR PU (GLOVE) IMPLANT
GLOVE INDICATOR 7.0 STRL GRN (GLOVE) ×4 IMPLANT
GLOVE SURG SS PI 6.5 STRL IVOR (GLOVE) ×10 IMPLANT
GOWN BRE IMP SLV AUR LG STRL (GOWN DISPOSABLE) ×2 IMPLANT
GOWN BRE IMP SLV AUR XL STRL (GOWN DISPOSABLE) ×6 IMPLANT
GOWN STRL REIN 2XL LVL4 (GOWN DISPOSABLE) IMPLANT
IMBIBE BONE MARROW ASPIRATION NEEDLE ×2 IMPLANT
KIT BASIN OR (CUSTOM PROCEDURE TRAY) ×2 IMPLANT
KIT INFUSE SMALL (Orthopedic Implant) ×2 IMPLANT
KIT ROOM TURNOVER OR (KITS) ×2 IMPLANT
MILL MEDIUM DISP (BLADE) ×2 IMPLANT
NEEDLE BONE MARROW 8GAX6 (NEEDLE) ×2 IMPLANT
NEEDLE HYPO 25X1 1.5 SAFETY (NEEDLE) ×2 IMPLANT
NEEDLE SPNL 18GX3.5 QUINCKE PK (NEEDLE) IMPLANT
NEEDLE SPNL 22GX3.5 QUINCKE BK (NEEDLE) ×2 IMPLANT
NS IRRIG 1000ML POUR BTL (IV SOLUTION) ×2 IMPLANT
PACK LAMINECTOMY NEURO (CUSTOM PROCEDURE TRAY) ×2 IMPLANT
PAD ARMBOARD 7.5X6 YLW CONV (MISCELLANEOUS) ×6 IMPLANT
PATTIES SURGICAL .5 X.5 (GAUZE/BANDAGES/DRESSINGS) IMPLANT
PATTIES SURGICAL .5 X1 (DISPOSABLE) IMPLANT
PATTIES SURGICAL 1X1 (DISPOSABLE) IMPLANT
PEEK PLIF AVS 9X20X4 (Peek) ×4 IMPLANT
ROD 50MM (Rod) ×1 IMPLANT
ROD MAX 50MM (Rod) ×2 IMPLANT
ROD SPNL 50X5.5XNS TI RDS (Rod) ×1 IMPLANT
SCREW 5.75X40M (Screw) ×4 IMPLANT
SCREW 6.75X40MM (Screw) ×2 IMPLANT
SPONGE GAUZE 4X4 12PLY (GAUZE/BANDAGES/DRESSINGS) ×2 IMPLANT
SPONGE LAP 4X18 X RAY DECT (DISPOSABLE) IMPLANT
SPONGE NEURO XRAY DETECT 1X3 (DISPOSABLE) IMPLANT
SPONGE SURGIFOAM ABS GEL 100 (HEMOSTASIS) ×2 IMPLANT
STRIP VITOSS 25X100X4MM (Neuro Prosthesis/Implant) ×2 IMPLANT
STRIP VITOSS 25X50X4MM (Neuro Prosthesis/Implant) ×2 IMPLANT
SUT VIC AB 1 CT1 18XBRD ANBCTR (SUTURE) ×3 IMPLANT
SUT VIC AB 1 CT1 8-18 (SUTURE) ×3
SUT VIC AB 2-0 CP2 18 (SUTURE) ×6 IMPLANT
SUT VIC AB 3-0 SH 8-18 (SUTURE) ×2 IMPLANT
SYR 20ML ECCENTRIC (SYRINGE) ×2 IMPLANT
SYR CONTROL 10ML LL (SYRINGE) ×2 IMPLANT
TAPE CLOTH SURG 4X10 WHT LF (GAUZE/BANDAGES/DRESSINGS) ×2 IMPLANT
TOWEL OR 17X24 6PK STRL BLUE (TOWEL DISPOSABLE) ×2 IMPLANT
TOWEL OR 17X26 10 PK STRL BLUE (TOWEL DISPOSABLE) ×2 IMPLANT
TRAY FOLEY CATH 14FRSI W/METER (CATHETERS) ×2 IMPLANT
WATER STERILE IRR 1000ML POUR (IV SOLUTION) ×2 IMPLANT

## 2012-01-24 NOTE — Transfer of Care (Signed)
Immediate Anesthesia Transfer of Care Note  Patient: [name] [name]  Procedure(s) Performed: Procedure(s) (LRB): POSTERIOR LUMBAR FUSION 1 LEVEL (N/A)  Patient Location: PACU  Anesthesia Type: General  Level of Consciousness: awake  Airway & Oxygen Therapy: Patient Spontanous Breathing  Post-op Assessment: Report given to PACU RN  Post vital signs: stable  Complications: No apparent anesthesia complications

## 2012-01-24 NOTE — H&P (Signed)
Subjective: Patient is a 66 y.o. female who is admitted for treatment of progressively worsening, now grade 3 spondylolisthesis L5 and S1 with canal and neural foraminal stenosis and associated with spondylitic disc herniation. She does have some back pain, and is status post an L4-5 lumbar decompression and arthrodesis in May of 2012. It appears that she developed a fracture across her L5 pars interarticularis which has led to the progressively worsening spondylolisthesis. Patient is admitted now for decompression and stabilization at the L5-S1 level.    Patient Active Problem List  Diagnoses Date Noted  . Allergic rhinitis due to pollen 01/23/2011  . SLEEP APNEA 07/04/2008  . DIABETES, TYPE 2 12/29/2007  . ANXIETY 12/26/2007  . DEPRESSION 12/26/2007  . HYPERTENSION 12/26/2007  . CHF, MILD 12/26/2007  . BRONCHITIS 12/26/2007  . SOMNOLENCE 12/26/2007   Past Medical History  Diagnosis Date  . Sleep apnea   . Allergic rhinitis   . Depression   . Anxiety   . CHF (congestive heart failure)   . Somnolence   . OSA (obstructive sleep apnea)   . Bronchitis   . Complication of anesthesia     low blood pressure once  . HTN (hypertension)     on medication since age 16  . Urinary, incontinence, stress female     wears depends  . Urinary incontinence, nocturnal enuresis   . Arthritis   . Idiopathic cirrhosis     stage 4; sees D rButler in [place]  . Splenomegaly   . Type II or unspecified type diabetes mellitus without mention of complication, not stated as uncontrolled   . GERD (gastroesophageal reflux disease)   . Fibromyalgia   . Hypercholesteremia   . Neuropathy, peripheral     lower extremities  . Enlarged heart   . Myocardial infarction     age 72    Past Surgical History  Procedure Date  . Mouth surgery   . Tonsilectomy, adenoidectomy, bilateral myringotomy and tubes   . Carpal tunnel release   . Total abdominal hysterectomy   . Appendectomy   . Knee arthroscopy   .  Eye surgery     cataract ext/ iol implants    Prescriptions prior to admission  Medication Sig Dispense Refill  . aspirin EC 81 MG tablet Take 81 mg by mouth daily.      Marland Kitchen buPROPion (WELLBUTRIN XL) 300 MG 24 hr tablet Take 300 mg by mouth daily.      . diclofenac-misoprostol (ARTHROTEC 75) 75-200 MG-MCG per tablet Take 1 tablet by mouth 2 (two) times daily.      Marland Kitchen diltiazem (DILACOR XR) 180 MG 24 hr capsule Take 180 mg by mouth at bedtime.      . ergocalciferol (VITAMIN D2) 50000 UNITS capsule Take 50,000 Units by mouth once a week. Monday      . Exenatide (BYDUREON) 2 MG SUSR Inject 2 mg into the skin once a week. On Monday      . ezetimibe (ZETIA) 10 MG tablet Take 5 mg by mouth at bedtime.       . fluticasone (FLONASE) 50 MCG/ACT nasal spray Place 2 sprays into the nose daily as needed. For allergies      . furosemide (LASIX) 20 MG tablet Take 20 mg by mouth daily.      Marland Kitchen gabapentin (NEURONTIN) 100 MG capsule Take 200-300 mg by mouth 3 (three) times daily. Take 200mg  in the morning and afternoon, take 300mg  at bedtime.      . isosorbide  mononitrate (IMDUR) 60 MG 24 hr tablet Take 60 mg by mouth daily.      . lansoprazole (PREVACID) 30 MG capsule Take 30 mg by mouth 2 (two) times daily before a meal.       . metoprolol (LOPRESSOR) 50 MG tablet Take 50 mg by mouth 2 (two) times daily.      . potassium chloride SA (K-DUR,KLOR-CON) 20 MEQ tablet Take 20 mEq by mouth 2 (two) times daily.      . rosuvastatin (CRESTOR) 10 MG tablet Take 5 mg by mouth daily.      . traMADol (ULTRAM) 50 MG tablet Take 50 mg by mouth every 6 (six) hours as needed. For pain      . valsartan-hydrochlorothiazide (DIOVAN-HCT) 160-12.5 MG per tablet Take 2 tablets by mouth daily.      Marland Kitchen venlafaxine (EFFEXOR) 50 MG tablet Take 50 mg by mouth daily.       Allergies  Allergen Reactions  . Azithromycin     REACTION: swelling  . Cefuroxime Axetil Hives  . Celecoxib     uknown  . Codeine Nausea And Vomiting  .  Hydralazine Hcl     unknown  . Sulfa Antibiotics Rash    History  Substance Use Topics  . Smoking status: Never Smoker   . Smokeless tobacco: Never Used  . Alcohol Use: No    Family History  Problem Relation Age of Onset  . Emphysema Mother   . Rheum arthritis Mother   . Cancer Mother     uterine, cervical, vaginal  . Anesthesia problems Mother   . Heart attack Father   . Stroke Father      Review of Systems A comprehensive review of systems was negative.  Objective: Vital signs in last 24 hours: Temp:  [97.6 F (36.4 C)-98.1 F (36.7 C)] 98.1 F (36.7 C) (03/07 0601) Pulse Rate:  [60-75] 75  (03/07 0601) Resp:  [18-20] 18  (03/07 0601) BP: (117-130)/(70-76) 117/70 mmHg (03/07 0601) SpO2:  [95 %-99 %] 99 % (03/07 0601) Weight:  [94.7 kg (208 lb 12.4 oz)] 94.7 kg (208 lb 12.4 oz) (03/06 1322)  EXAM: Patient is obese white female in no acute distress. Lungs are clear to auscultation , the patient has symmetrical respiratory excursion. Heart has a regular rate and rhythm normal S1 and S2 no murmur.   Abdomen is soft nontender nondistended bowel sounds are present. Extremity examination shows no clubbing cyanosis or edema. Neuro exam shows bilateral dorsiflexor weakness, left worse than right. With good strength in the plantar flexor.  Data Review:CBC    Component Value Date/Time   WBC 7.4 01/23/2012 1429   RBC 4.10 01/23/2012 1429   HGB 11.1* 01/23/2012 1429   HCT 33.6* 01/23/2012 1429   PLT 122* 01/23/2012 1429   MCV 82.0 01/23/2012 1429   MCH 27.1 01/23/2012 1429   MCHC 33.0 01/23/2012 1429   RDW 15.5 01/23/2012 1429   LYMPHSABS 1.1 05/15/2011 1828   MONOABS 0.7 05/15/2011 1828   EOSABS 0.0 05/15/2011 1828   BASOSABS 0.0 05/15/2011 1828                          BMET    Component Value Date/Time   NA 135 01/23/2012 1429   K 3.1* 01/23/2012 1429   CL 98 01/23/2012 1429   CO2 30 01/23/2012 1429   GLUCOSE 132* 01/23/2012 1429   BUN 13 01/23/2012 1429   CREATININE 0.91 01/23/2012 1429  CALCIUM 9.6 01/23/2012 1429   GFRNONAA 65* 01/23/2012 1429   GFRAA 75* 01/23/2012 1429     Assessment/Plan: Patient status post a previous L4-5 lumbar decompression and arthrodesis. She's developed a progressive spinal listhesis at L5-S1, and is admitted now for decompression and stabilization. We'll plan on an L5-S1 lumbar laminectomy, facetectomy and foraminotomy at L5-S1 discectomy and interbody arthrodesis, if feasible, and posterior arthrodesis from L5-S1 extending instrumentation from L4-S1. I've discussed with the patient the nature of his condition, the nature the surgical procedure, the typical length of surgery, hospital stay, and overall recuperation, the limitations postoperatively, and risks of surgery. I discussed risks including risks of infection, bleeding, possibly need for transfusion, the risk of nerve root dysfunction with pain, weakness, numbness, or paresthesias, the risk of dural tear and CSF leakage and possible need for further surgery, the risk of failure of the arthrodesis and possibly for further surgery, the risk of anesthetic complications including myocardial infarction, stroke, pneumonia, and death. We discussed the need for postoperative immobilization in a lumbar brace. Understanding all this the patient does wish to proceed with surgery and is admitted for such.     Hewitt Shorts, MD 01/24/2012 7:30 AM

## 2012-01-24 NOTE — Anesthesia Preprocedure Evaluation (Addendum)
Anesthesia Evaluation  Patient identified by MRN, date of birth, ID band Patient awake    Reviewed: Allergy & Precautions, H&P , NPO status   Airway Mallampati: III TM Distance: >3 FB     Dental   Pulmonary sleep apnea and Continuous Positive Airway Pressure Ventilation ,  breath sounds clear to auscultation        Cardiovascular hypertension, Pt. on medications + Past MI and +CHF Rhythm:Irregular Rate:Normal     Neuro/Psych Anxiety Depression    GI/Hepatic   Endo/Other  Diabetes mellitus-, Type 2  Renal/GU      Musculoskeletal  (+) Fibromyalgia -  Abdominal (+) + obese,  Abdomen: soft.    Peds  Hematology   Anesthesia Other Findings   Reproductive/Obstetrics                         Anesthesia Physical Anesthesia Plan  ASA: III  Anesthesia Plan: General   Post-op Pain Management:    Induction: Intravenous  Airway Management Planned: Oral ETT  Additional Equipment:   Intra-op Plan:   Post-operative Plan: Extubation in OR  Informed Consent: I have reviewed the patients History and Physical, chart, labs and discussed the procedure including the risks, benefits and alternatives for the proposed anesthesia with the patient or authorized representative who has indicated his/her understanding and acceptance.   Dental advisory given  Plan Discussed with: CRNA and Surgeon  Anesthesia Plan Comments: (Lumbar Spondylosis Chronic Afib Htn Obesity OSA Idiopathic cirrhosis with splenomegaly LFTs PT, Plts OK  Plan GA with art line  Deitra Mayo, MD)        Anesthesia Quick Evaluation

## 2012-01-24 NOTE — Anesthesia Procedure Notes (Signed)
Procedure Name: Intubation Date/Time: 01/24/2012 7:51 AM Performed by: Ellin Goodie Pre-anesthesia Checklist: Patient identified, Emergency Drugs available, Suction available, Patient being monitored and Timeout performed Patient Re-evaluated:Patient Re-evaluated prior to inductionOxygen Delivery Method: Circle system utilized Preoxygenation: Pre-oxygenation with 100% oxygen Intubation Type: IV induction Ventilation: Mask ventilation without difficulty Laryngoscope Size: Mac and 3 Grade View: Grade II Tube type: Oral Number of attempts: 1 Airway Equipment and Method: Stylet Placement Confirmation: ETT inserted through vocal cords under direct vision,  positive ETCO2 and breath sounds checked- equal and bilateral Secured at: 23 cm Tube secured with: Tape Dental Injury: Teeth and Oropharynx as per pre-operative assessment

## 2012-01-24 NOTE — Progress Notes (Signed)
Filed Vitals:   01/24/12 1322 01/24/12 1345 01/24/12 1422 01/24/12 1645  BP:  93/58 77/44 103/65  Pulse:  53 49 60  Temp: 97.2 F (36.2 C) 98.1 F (36.7 C) 97.4 F (36.3 C) 97.7 F (36.5 C)  TempSrc:   Oral Oral  Resp:  16 18 18   SpO2:  94% 95% 97%    Patient resting comfortably in bed. Vigorously eating dinner. Dressing clean and dry. Most recent blood sugar per her nurse was 126, and remains well-controlled. Moving lower extremities well. Foley straight drainage.  Plan: Continued to progress through postoperative recovery. We'll leave Foley in until up and about and ambulating in halls.  Hewitt Shorts, MD 01/24/2012, 5:38 PM

## 2012-01-24 NOTE — Anesthesia Postprocedure Evaluation (Signed)
  Anesthesia Post-op Note  Patient: [name] [name]  Procedure(s) Performed: Procedure(s) (LRB): POSTERIOR LUMBAR FUSION 1 LEVEL (N/A)  Patient Location: PACU  Anesthesia Type: General  Level of Consciousness: awake, alert  and oriented  Airway and Oxygen Therapy: Patient Spontanous Breathing and Patient connected to nasal cannula oxygen  Post-op Pain: mild  Post-op Assessment: Post-op Vital signs reviewed and Patient's Cardiovascular Status Stable  Post-op Vital Signs: stable  Complications: No apparent anesthesia complications

## 2012-01-24 NOTE — Progress Notes (Signed)
EKG on arrival shows atrial fib. Pt reports that she is treated for such.  Dr. Katrinka Blazing, Anesthesiologist on call, notified.//L. Kyley Laurel,RN

## 2012-01-24 NOTE — Op Note (Signed)
01/24/2012  12:41 PM  PATIENT:  [name] [name]  66 y.o. female  PRE-OPERATIVE DIAGNOSIS:  spondylolisthesis lumbar stenosis lumbar spondylosis lumbar degenerative disc disease  POST-OPERATIVE DIAGNOSIS:  spondylolisthesis lumbar stenosis lumbar spondylosis lumbar degenerative disc disease  PROCEDURE:  Procedure(s): POSTERIOR LUMBAR FUSION 1 LEVEL: Bilateral L5 and S1 lumbar laminotomy, foraminotomy, and facetectomy, with decompression of the exiting L5 and S1 nerve roots bilaterally, with decompression beyond that required for interbody arthrodesis, with microdissection and microsurgical technique, bilateral L5-S1 posterior lumbar interbody arthrodesis with AVS peek interbody implants and Vitoss with bone marrow aspirate and infuse, bilateral L5-S1 posterior lateral arthrodesis with Vitoss with bone marrow aspirate and infuse, and posterior instrumentation from L4-S1 with explantation of the previous left L5 screw and replacement with a new screw and bilateral screw placement at S1, radius posterior instrumentation was used.  SURGEON:  Surgeon(s): Hewitt Shorts, MD Dorian Heckle, MD  ASSISTANTS: Danae Orleans. Venetia Maxon, M.D.  ANESTHESIA:   general  EBL:  Total I/O In: 3350 [I.V.:3000; Blood:100; IV Piggyback:250] Out: 480 [Urine:80; Blood:400]  BLOOD ADMINISTERED:100 CC CELLSAVER  COUNT: Correct per nursing staff  DICTATION: Patient is brought to the operating room placed under general endotracheal anesthesia. The patient was turned to prone position the lumbar region was prepped with Betadine soap and solution and draped in a sterile fashion. The midline was infiltrated with local anesthesia with epinephrine. A midline incision is made carried down to subcutaneous tissue bipolar cautery and electrocautery were used to maintain hemostasis dissection was carried out the lumbar fascia. The fascia was incised bilaterally and the paraspinal muscles were dissected with a spinous process and lamina  in a subperiosteal fashion. Dissection was carried out laterally and we then applied the posterior instrumentation present at the L4 and L5 levels. Another x-ray was taken for localization and the L5-S1 intralaminar level was localized. Dissection was then carried out laterally over the facet complex, which was hypertrophic, and the transverse processes of L5 and S1 were exposed and decorticated. Decompression via bilateral L5-S1 laminotomy was performed using the high-speed drill and Kerrison punches. Dissection was carried out laterally including facetectomy and foraminotomies with decompression of the L5 and S1 nerve roots. Once the decompression of the thecal sac and exiting nerve roots was completed we proceeded with the posterior lumbar interbody arthrodesis. We explored the ventral epidural space. The annulus was incised bilaterally and the disc space entered a thorough discectomy was performed using pituitary rongeurs and curettes. There was a significant anterolisthesis of L5 on S1 and there was significant spondylitic disc herniation in the ventral epidural space that was carefully removed. Once the discectomy was completed we began to prepare the endplate surfaces removing the cartilaginous endplates surface with curettes. We then measured the height of the intervertebral disc space. We selected 9 x 20 x 4 AVS peek interbody implants.  The C-arm fluoroscope was then draped and brought in the field and we identified the pedicle entry points bilaterally at the S1 level. Each of the 2 pedicles was probed, we aspirated bone marrow aspirate from the vertebral bodies this was injected over a 10 cc strip of Vitoss. Then each of the pedicles was examined with the ball probe good bony surfaces were found and no bony cuts were found. Each of the pedicles was then tapped with a 5.25 mm tap, again examined with the ball probe good threading was found and no bony cuts were found. We then placed 5.75 by 40  millimeter screws bilaterally at the  S1 level.  We then packed the AVS peek interbody implants with Vitoss with bone marrow aspirate and infuse, and then placed the first implant on the right side, carefully retracting the thecal sac and nerve root medially. We then went back to the left side and packed the midline with additional Vitoss with bone marrow aspirate and infuse and then placed a second implant and on the left side again retracting the thecal sac and nerve root medially. Additional Vitoss with bone marrow aspirate was packed lateral to the implants.  We then packed the lateral gutter over the transverse processes and intertransverse space with Vitoss with bone marrow aspirate and infuse. We then selected 50 rods, using a pre-lordosed rods on the left and a hyper-pre-lordosed rods on the right. To position them we did have to back out the L5 screws slightly, on the right side the screw still felt firmly secure, however the left side the screw seemed loose, and therefore it was removed and was switched to a 6.75 x 40 mm screw. The rods were placed within the screw heads and secured with locking caps, once all 6 locking caps were placed, final tightening was performed against a counter torque.  The wound had been irrigated multiple times during the procedure with saline solution and bacitracin solution, good hemostasis was established with a combination of bipolar cautery and Gelfoam with thrombin. Once good hemostasis was confirmed we proceeded with closure paraspinal muscles deep fascia and Scarpa's fascia were closed with interrupted undyed 1 Vicryl sutures the subcutaneous and subcuticular closed with interrupted inverted 2-0 undyed Vicryl sutures the skin edges were approximated with Dermabond.  Following surgery the patient was turned back to the supine position to be reversed and the anesthetic extubated and transferred to the recovery room for further care.   PLAN OF CARE: Admit to  inpatient   PATIENT DISPOSITION:  PACU - hemodynamically stable.   Delay start of Pharmacological VTE agent (>24hrs) due to surgical blood loss or risk of bleeding:  yes

## 2012-01-25 LAB — GLUCOSE, CAPILLARY
Glucose-Capillary: 170 mg/dL — ABNORMAL HIGH (ref 70–99)
Glucose-Capillary: 167 mg/dL — ABNORMAL HIGH (ref 70–99)
Glucose-Capillary: 153 mg/dL — ABNORMAL HIGH (ref 70–99)
Glucose-Capillary: 149 mg/dL — ABNORMAL HIGH (ref 70–99)

## 2012-01-25 MED ORDER — EXENATIDE ER 2 MG ~~LOC~~ SUSR
2.0000 mg | SUBCUTANEOUS | Status: DC
  Administered 2012-01-29: 2 mg via SUBCUTANEOUS
  Filled 2012-01-25 (×2): qty 1

## 2012-01-25 NOTE — Progress Notes (Signed)
Filed Vitals:   01/24/12 2117 01/24/12 2220 01/25/12 0200 01/25/12 0600  BP:  105/62 111/63 120/61  Pulse: 78 78 58 99  Temp:  97.9 F (36.6 C) 100 F (37.8 C) 100.1 F (37.8 C)  TempSrc:      Resp: 18 18 18 18   Height: 5\' 3"  (1.6 m)     Weight: 94.348 kg (208 lb)     SpO2:  98% 100% 90%    Patient resting in bed, complaining of increased pain. Dressing clean and dry. Moving all extremities well. Taking well by mouth. Accu-Cheks 124-170.  Plan: Encouraged to ambulate 4 times a day in halls. We'll change IV to a saline lock. We'll DC Foley once up and ambulating actively. We'll change dressing in a.m. To be seen by PT and OT.  Hewitt Shorts, MD 01/25/2012, 7:33 AM

## 2012-01-25 NOTE — Progress Notes (Signed)
UR COMPLETED  

## 2012-01-25 NOTE — Progress Notes (Signed)
Clinical Social Worker received a consult to address discharge plan. Currently PT is recommending home health PT with intermittent supervision. CSW met with pt and pt's sister to address consult. CSW introduced herself and explained role of social work. Pt shared that she lives at home with her husband, who is supportive, however may not provide the care that pt needs at home. Pt shared that she would benefit from PT from a SNF. Pt's sister is in agreement of this. CSW will complete FL2 and send request to [facility] for SNF. CSW will follow up with bed offers. CSW will continue to follow.   Dede Query, MSW, Theresia Majors [phone]

## 2012-01-25 NOTE — Progress Notes (Signed)
Physical Therapy Evaluation Patient Details Name: [name] [name] MRN: [id number] DOB: 1946-03-04 Today's Date: 01/25/2012  Problem List:  Patient Active Problem List  Diagnoses  . DIABETES, TYPE 2  . ANXIETY  . DEPRESSION  . HYPERTENSION  . CHF, MILD  . BRONCHITIS  . SOMNOLENCE  . SLEEP APNEA  . Allergic rhinitis due to pollen    Past Medical History:  Past Medical History  Diagnosis Date  . Sleep apnea   . Allergic rhinitis   . Depression   . Anxiety   . CHF (congestive heart failure)   . Somnolence   . OSA (obstructive sleep apnea)   . Bronchitis   . Complication of anesthesia     low blood pressure once  . HTN (hypertension)     on medication since age 69  . Urinary, incontinence, stress female     wears depends  . Urinary incontinence, nocturnal enuresis   . Arthritis   . Idiopathic cirrhosis     stage 4; sees Dr. Charm Barges in [place]  . Splenomegaly   . Type II or unspecified type diabetes mellitus without mention of complication, not stated as uncontrolled   . GERD (gastroesophageal reflux disease)   . Fibromyalgia   . Hypercholesteremia   . Neuropathy, peripheral     lower extremities  . Enlarged heart   . Myocardial infarction     age 53   Past Surgical History:  Past Surgical History  Procedure Date  . Mouth surgery   . Carpal tunnel release     bilaterally  . Total abdominal hysterectomy   . Appendectomy   . Knee arthroscopy   . Eye surgery     cataract ext/ iol implants  . Back surgery   . Lumbar laminectomy 03/2011; 01/2012  . Tonsillectomy and adenoidectomy   . Dilation and curettage of uterus   . Cataract extraction w/ intraocular lens  implant, bilateral     PT Assessment/Plan/Recommendation PT Assessment Clinical Impression Statement: Pt presents with a medical diagnosis of PLIF. Pt with increased fatigue during the evaluation from recent pain meds. Will attempt increased ambulation distance and stairs next session to continue  evaluation prior to d/c. Pt will benefit from skilled PT in the acute care setting in order to maximize functional mobility for safe d/c home PT Recommendation/Assessment: Patient will need skilled PT in the acute care venue PT Problem List: Decreased activity tolerance;Decreased mobility;Decreased knowledge of use of DME;Pain;Decreased safety awareness;Decreased knowledge of precautions PT Therapy Diagnosis : Abnormality of gait;Acute pain PT Plan PT Frequency: Min 5X/week PT Treatment/Interventions: DME instruction;Gait training;Stair training;Functional mobility training;Therapeutic activities;Therapeutic exercise;Patient/family education PT Recommendation Follow Up Recommendations: Home health PT;Supervision - Intermittent Equipment Recommended: None recommended by PT;None recommended by OT PT Goals  Acute Rehab PT Goals PT Goal Formulation: With patient Time For Goal Achievement: 7 days Pt will go Supine/Side to Sit: with modified independence PT Goal: Supine/Side to Sit - Progress: Goal set today Pt will go Sit to Supine/Side: with modified independence PT Goal: Sit to Supine/Side - Progress: Goal set today Pt will go Sit to Stand: with modified independence PT Goal: Sit to Stand - Progress: Goal set today Pt will go Stand to Sit: with modified independence PT Goal: Stand to Sit - Progress: Goal set today Pt will Transfer Bed to Chair/Chair to Bed: with supervision PT Transfer Goal: Bed to Chair/Chair to Bed - Progress: Goal set today Pt will Ambulate: >150 feet;with supervision;with least restrictive assistive device PT Goal: Ambulate -  Progress: Goal set today  PT Evaluation Precautions/Restrictions  Precautions Precautions: Back Precaution Booklet Issued: Yes (comment) Precaution Comments: Pt able to state 2/3 back precautions.  Reviewed 3/3 back precautions with pt and how to incorporate them into functional transfers and ADLs. Required Braces or Orthoses: Yes Spinal  Brace: Lumbar corset;Applied in sitting position Restrictions Weight Bearing Restrictions: No Prior Functioning  Home Living Lives With: Spouse Receives Help From: Family Type of Home: House Home Layout: One level Home Access: Stairs to enter Entrance Stairs-Rails: None Entrance Stairs-Number of Steps: 1 Bathroom Shower/Tub: Engineer, manufacturing systems: Standard Bathroom Accessibility: Yes How Accessible: Accessible via walker Home Adaptive Equipment: Bedside commode/3-in-1;Tub transfer bench;Walker - rolling;Straight cane;Reacher;Sock aid Prior Function Level of Independence: Independent with basic ADLs;Requires assistive device for independence Able to Take Stairs?: Yes Driving: Yes Comments: Pt able to perform ADLs with I/mod I with occasional use of AE. Pt states that her husband is often nearby during ADLs as pt's LLE gives out on her occasionally. Pt states that she has had "a couple of hard falls" at home due to her LLE giving out on her.  Cognition Cognition Arousal/Alertness: Lethargic Overall Cognitive Status: Appears within functional limits for tasks assessed Orientation Level: Oriented X4 Sensation/Coordination Sensation Light Touch: Appears Intact Coordination Gross Motor Movements are Fluid and Coordinated: Yes Extremity Assessment RUE Assessment RUE Assessment: Within Functional Limits LUE Assessment LUE Assessment: Within Functional Limits RLE Assessment RLE Assessment: Within Functional Limits LLE Assessment LLE Assessment: Within Functional Limits Mobility (including Balance) Bed Mobility Bed Mobility: Yes Rolling Left: 4: Min assist Rolling Left Details (indicate cue type and reason): VC for hand placement and rolling to maintain back precautions. Left Sidelying to Sit: 4: Min assist;With rails;HOB elevated (comment degrees) Left Sidelying to Sit Details (indicate cue type and reason): VC for sequencing to maintain back precautions. Assist at  pelvis and trunk Sitting - Scoot to Edge of Bed: 6: Modified independent (Device/Increase time) Sit to Supine: 5: Supervision Sit to Supine - Details (indicate cue type and reason): VC for sequencing to maintain back precautions Sit to Sidelying Left: 5: Supervision;HOB flat Sit to Sidelying Left Details (indicate cue type and reason): Pt able to demonstrate correct log roll technique. Transfers Transfers: Yes Sit to Stand: 4: Min assist;With upper extremity assist;From bed Sit to Stand Details (indicate cue type and reason): VC for hand placement for safety to RW. Cues to maintain back precautions as pt tends to bend at the back with standing Stand to Sit: To bed;With upper extremity assist;Other (comment);5: Supervision Stand to Sit Details: VC for hand placement. Pt able to control descent into bed Ambulation/Gait Ambulation/Gait: Yes Ambulation/Gait Assistance: 4: Min assist Ambulation/Gait Assistance Details (indicate cue type and reason): Min assist for stability as pt with increased fatigue. VC throughout for distance to RW Ambulation Distance (Feet): 30 Feet Assistive device: Rolling walker Gait Pattern: Step-to pattern;Decreased stride length;Decreased hip/knee flexion - right;Decreased hip/knee flexion - left;Antalgic;Trunk flexed Gait velocity: Decreased gait speed Stairs: No    Exercise    End of Session PT - End of Session Equipment Utilized During Treatment: Gait belt;Back brace Activity Tolerance: Patient tolerated treatment well Patient left: in bed;with call bell in reach;with family/visitor present Nurse Communication: Mobility status for transfers;Mobility status for ambulation General Behavior During Session: [facility] for tasks performed Cognition: [facility] for tasks performed  Milana Kidney 01/25/2012, 10:56 AM  01/25/2012 Milana Kidney DPT PAGER: [phone] OFFICE: [phone]

## 2012-01-25 NOTE — Progress Notes (Addendum)
Occupational Therapy Evaluation Patient Details Name: [name] [name] MRN: [id number] DOB: 10-10-1946 Today's Date: 01/25/2012  Problem List:  Patient Active Problem List  Diagnoses  . DIABETES, TYPE 2  . ANXIETY  . DEPRESSION  . HYPERTENSION  . CHF, MILD  . BRONCHITIS  . SOMNOLENCE  . SLEEP APNEA  . Allergic rhinitis due to pollen    Past Medical History:  Past Medical History  Diagnosis Date  . Sleep apnea   . Allergic rhinitis   . Depression   . Anxiety   . CHF (congestive heart failure)   . Somnolence   . OSA (obstructive sleep apnea)   . Bronchitis   . Complication of anesthesia     low blood pressure once  . HTN (hypertension)     on medication since age 89  . Urinary, incontinence, stress female     wears depends  . Urinary incontinence, nocturnal enuresis   . Arthritis   . Idiopathic cirrhosis     stage 4; sees Dr. Charm Barges in [place]  . Splenomegaly   . Type II or unspecified type diabetes mellitus without mention of complication, not stated as uncontrolled   . GERD (gastroesophageal reflux disease)   . Fibromyalgia   . Hypercholesteremia   . Neuropathy, peripheral     lower extremities  . Enlarged heart   . Myocardial infarction     age 5   Past Surgical History:  Past Surgical History  Procedure Date  . Mouth surgery   . Carpal tunnel release     bilaterally  . Total abdominal hysterectomy   . Appendectomy   . Knee arthroscopy   . Eye surgery     cataract ext/ iol implants  . Back surgery   . Lumbar laminectomy 03/2011; 01/2012  . Tonsillectomy and adenoidectomy   . Dilation and curettage of uterus   . Cataract extraction w/ intraocular lens  implant, bilateral     OT Assessment/Plan/Recommendation OT Assessment Clinical Impression Statement: Pt s/p posterior lumbar fusion 1 level thus affecting PLOF.  Will benefit from acute OT to address below problem list in prep for d/c home with husband. OT Recommendation/Assessment: Patient will need  skilled OT in the acute care venue OT Problem List: Decreased activity tolerance;Pain;Decreased knowledge of precautions;Decreased knowledge of use of DME or AE OT Therapy Diagnosis : Acute pain OT Plan OT Frequency: Min 2X/week OT Treatment/Interventions: Self-care/ADL training;DME and/or AE instruction;Therapeutic activities;Patient/family education OT Recommendation Follow Up Recommendations: Supervision/Assistance - 24 hour Equipment Recommended: None recommended by OT Individuals Consulted Consulted and Agree with Results and Recommendations: Patient OT Goals Acute Rehab OT Goals OT Goal Formulation: With patient Time For Goal Achievement: 7 days ADL Goals Pt Will Perform Grooming: with modified independence;Standing at sink (maintaining back safety precautions) ADL Goal: Grooming - Progress: Goal set today Pt Will Perform Lower Body Bathing: with modified independence;Sit to stand from chair;Sit to stand from bed;with adaptive equipment ADL Goal: Lower Body Bathing - Progress: Goal set today Pt Will Perform Lower Body Dressing: with modified independence;Sit to stand from bed;Sit to stand from chair;with adaptive equipment ADL Goal: Lower Body Dressing - Progress: Goal set today Pt Will Transfer to Toilet: with modified independence;with DME;3-in-1;Ambulation;Maintaining back safety precautions ADL Goal: Toilet Transfer - Progress: Goal set today Pt Will Perform Tub/Shower Transfer: Tub transfer;with modified independence;Ambulation;with DME;Transfer tub bench ADL Goal: Tub/Shower Transfer - Progress: Goal set today  OT Evaluation Precautions/Restrictions  Precautions Precautions: Back Precaution Booklet Issued: Yes (comment) Precaution Comments: Pt able  to state 2/3 back precautions.  Reviewed 3/3 back precautions with pt and how to incorporate them into functional transfers and ADLs. Required Braces or Orthoses: Yes Spinal Brace: Lumbar corset;Applied in sitting  position Restrictions Weight Bearing Restrictions: No Prior Functioning Home Living Lives With: Spouse Receives Help From: Family (husband) Type of Home: House Home Layout: One level Home Access: Stairs to enter Entrance Stairs-Rails: None Entrance Stairs-Number of Steps: 1 Bathroom Shower/Tub: Engineer, manufacturing systems: Standard Home Adaptive Equipment: Bedside commode/3-in-1;Tub transfer bench;Walker - rolling;Straight cane;Reacher;Sock aid Prior Function Level of Independence: Independent with basic ADLs;Requires assistive device for independence Driving: Yes Comments: Pt able to perform ADLs with I/mod I with occasional use of AE.  Pt states that her husband is often nearby during ADLs as pt's LLE gives out on her occasionally.  Pt states that she has had "a couple of hard falls" at home due to her LLE giving out on her.   ADL ADL Toilet Transfer: Simulated;Minimal assistance Toilet Transfer Details (indicate cue type and reason): cueing for safe use of RW and for safe hand placement before beginning descent. Toilet Transfer Method: Proofreader: Other (comment) (bed) Equipment Used: Rolling walker Ambulation Related to ADLs: min guard assist with vc for upright posture and use of RW ADL Comments: Pt able to doff brace with mod I sitting EOB. Pt has reacher in room.  Vision/Perception    Cognition Cognition Arousal/Alertness: Awake/alert Overall Cognitive Status: Appears within functional limits for tasks assessed Orientation Level: Oriented X4 Sensation/Coordination   Extremity Assessment RUE Assessment RUE Assessment: Within Functional Limits LUE Assessment LUE Assessment: Within Functional Limits Mobility  Bed Mobility Bed Mobility: Yes Sit to Sidelying Left: 5: Supervision;HOB flat Sit to Sidelying Left Details (indicate cue type and reason): Pt able to demonstrate correct log roll technique. Transfers Transfers: Yes (sit to stand  not observed as pt ambulating in hallway with RN upon OT arrival ) Stand to Sit: To bed;With upper extremity assist;Other (comment) (min guard assist) Exercises   End of Session OT - End of Session Equipment Utilized During Treatment: Back brace Activity Tolerance: Patient tolerated treatment well Patient left: in bed;with call bell in reach;with family/visitor present Nurse Communication: Mobility status for transfers General Behavior During Session: [facility] for tasks performed Cognition: [facility] for tasks performed   9:01 AM  01/25/2012 Cipriano Mile OTR/L Pager [phone] Office [phone]

## 2012-01-26 LAB — URINALYSIS, MICROSCOPIC ONLY
Bilirubin Urine: NEGATIVE
Glucose, UA: NEGATIVE mg/dL
Hgb urine dipstick: NEGATIVE
Ketones, ur: NEGATIVE mg/dL
Nitrite: NEGATIVE
Protein, ur: NEGATIVE mg/dL
Specific Gravity, Urine: 1.012 (ref 1.005–1.030)
Urobilinogen, UA: 1 mg/dL (ref 0.0–1.0)
pH: 5.5 (ref 5.0–8.0)

## 2012-01-26 LAB — GLUCOSE, CAPILLARY
Glucose-Capillary: 200 mg/dL — ABNORMAL HIGH (ref 70–99)
Glucose-Capillary: 176 mg/dL — ABNORMAL HIGH (ref 70–99)
Glucose-Capillary: 160 mg/dL — ABNORMAL HIGH (ref 70–99)
Glucose-Capillary: 165 mg/dL — ABNORMAL HIGH (ref 70–99)

## 2012-01-26 MED ORDER — POLYETHYLENE GLYCOL 3350 17 G PO PACK
17.0000 g | PACK | Freq: Every day | ORAL | Status: DC
  Filled 2012-01-26: qty 1

## 2012-01-26 NOTE — Progress Notes (Signed)
No new issues. Overall patient continues to do well. She reports that her pain is reasonably well controlled. She is awaiting discharge to skilled nursing facility versus rehabilitation unit.  She is afebrile. Her vitals are stable. Urine output is good. Wound is clean dry and intact. Neurologic exam finds her motor and sensory examination be stable. Chest and abdomen are benign.  Progressing well following lumbar decompression and fusion surgery. Plan to continue efforts at mobilization. Working towards discharge to skilled nursing facility probably early in the week.

## 2012-01-26 NOTE — Progress Notes (Signed)
Occupational Therapy Treatment Patient Details Name: [name] [name] MRN: [id number] DOB: Mar 08, 1946 Today's Date: 01/26/2012  OT Assessment/Plan OT Assessment/Plan Comments on Treatment Session: Pt and husband both state that they would prefer SNF upon d/c as husband is limited to level of assist he can provide.  OT Plan: Discharge plan needs to be updated OT Frequency: Min 2X/week Follow Up Recommendations: Skilled nursing facility Equipment Recommended: Defer to next venue OT Goals ADL Goals Pt Will Perform Lower Body Bathing: with modified independence;Sit to stand from chair;Sit to stand from bed;with adaptive equipment ADL Goal: Lower Body Bathing - Progress: Progressing toward goals Pt Will Perform Lower Body Dressing: with modified independence;Sit to stand from bed;Sit to stand from chair;with adaptive equipment ADL Goal: Lower Body Dressing - Progress: Progressing toward goals  OT Treatment Precautions/Restrictions  Precautions Precautions: Back Precaution Booklet Issued: Yes (comment) Precaution Comments: Pt able to verbalize 1/3 back precautions. Required Braces or Orthoses: Yes Spinal Brace: Lumbar corset;Applied in sitting position Restrictions Weight Bearing Restrictions: No   ADL ADL Lower Body Bathing: Simulated;Moderate assistance Lower Body Bathing Details (indicate cue type and reason): Educated pt on technique to use while maintaining back precautions using AE. Where Assessed - Lower Body Bathing: Sitting, chair Lower Body Dressing: Simulated;Moderate assistance Lower Body Dressing Details (indicate cue type and reason): Educated pt on technique to maintain back precautions using AE. Where Assessed - Lower Body Dressing: Sitting, chair ADL Comments: Pt requiring max assist to re-adjust back brace due to decreased cognition. Mobility    Exercises    End of Session OT - End of Session Equipment Utilized During Treatment: Back brace Activity Tolerance:  Other (comment) (limited secondary to decreased cognition/lethargic) Patient left: in chair;with call bell in reach;with family/visitor present Nurse Communication: Other (comment) (decreased cognition) General Behavior During Session: Lethargic Cognition: Impaired (Pt unable to follow two step commands consistently. ) Cognitive Impairment: Pt unable to follow multi step commands at this time.  Pt only able to recall 1/3 back precautions and states that "the kind of sandwich I eat is my back precaution".  Pt  with difficulty processing information and decreased problem solving.  Cognitive deficits this AM possibly due to meds. RN made aware. Treatment limited as pt was not retaining education presented.   12:19 PM 01/26/2012 Cipriano Mile OTR/L Pager [phone] Office [phone]

## 2012-01-26 NOTE — Progress Notes (Signed)
Physical Therapy Treatment Patient Details Name: [name] [name] MRN: [id number] DOB: 27-Sep-1946 Today's Date: 01/26/2012  PT Assessment/Plan  PT - Assessment/Plan Comments on Treatment Session: Pt with increased cognitive deficits as listed above. Pt required increased assistance for all mobility. Spoke with RN who said pt is presenting with decreased mobility, even from just this morning, possibly due to meds. Will continue to reasess in further sessions.  PT Plan: Discharge plan needs to be updated;Frequency remains appropriate PT Frequency: Min 5X/week Follow Up Recommendations: Skilled nursing facility Equipment Recommended: Defer to next venue PT Goals  Acute Rehab PT Goals PT Goal Formulation: With patient PT Goal: Supine/Side to Sit - Progress: Not progressing PT Goal: Sit to Supine/Side - Progress: Not progressing PT Goal: Sit to Stand - Progress: Not progressing PT Goal: Stand to Sit - Progress: Not progressing PT Transfer Goal: Bed to Chair/Chair to Bed - Progress: Not progressing PT Goal: Ambulate - Progress: Not progressing  PT Treatment Precautions/Restrictions  Precautions Precautions: Back Precaution Booklet Issued: Yes (comment) Precaution Comments: Pt had difficulty verbalizing back precautions; re-educated pt on proper back precautions Required Braces or Orthoses: Yes Spinal Brace: Lumbar corset;Applied in sitting position Restrictions Weight Bearing Restrictions: No Mobility (including Balance) Bed Mobility Bed Mobility: Yes Rolling Left: 2: Max assist Rolling Left Details (indicate cue type and reason): VC for proper sequencing. Max assist with manual facilitation at trunk and pelvis. Left Sidelying to Sit: 2: Max assist (30) Left Sidelying to Sit Details (indicate cue type and reason): VC for sequencing to maintain back precautions. Assist and stability at trunk as well as tactile cues through the pelvis. Support from back for balance upon sitting Sitting -  Scoot to Edge of Bed: 3: Mod assist Sitting - Scoot to Edge of Bed Details (indicate cue type and reason): VC for weight shifting. Increased time to complete Sit to Sidelying Left: 4: Min assist Sit to Sidelying Left Details (indicate cue type and reason): Min assist with LEs to get into bed. VC in order to maintain back precautions Transfers Transfers: Yes Sit to Stand: 3: Mod assist;1: +2 Total assist;Patient percentage (comment);From bed;From chair/3-in-1 (50%) Sit to Stand Details (indicate cue type and reason): Mod assist at times up to +2 Assist, pt 50%. VC for anterior translation into standing. Assist for stability and initiation of movement. VC throughout for hand placement Stand to Sit: 3: Mod assist;With upper extremity assist;To chair/3-in-1;To bed Stand to Sit Details: VC for hand placement. Assist for controlled descent Stand Pivot Transfers: 1: +2 Total assist;Patient percentage (comment) (50%) Stand Pivot Transfer Details (indicate cue type and reason): +2 Total assist for stability throughout transfer. Pt heavily reliable on therapists for support during transfer. VC for sequencing and hand placement Ambulation/Gait Ambulation/Gait: Yes Ambulation/Gait Assistance: 3: Mod assist Ambulation/Gait Assistance Details (indicate cue type and reason): Assist for stability secondary to pt with balance loss. VC throughout to maintain safe distance to RW for support and stabilization. Pt with decreased speed today and requiring more assist Ambulation Distance (Feet): 30 Feet Assistive device: Rolling walker Gait Pattern: Step-to pattern;Decreased stride length;Decreased hip/knee flexion - right;Decreased hip/knee flexion - left;Antalgic;Trunk flexed Gait velocity: Decreased gait speed Stairs: No    Exercise    End of Session PT - End of Session Equipment Utilized During Treatment: Gait belt;Back brace Activity Tolerance: Patient limited by fatigue;Patient limited by pain Patient  left: in bed;with call bell in reach;with family/visitor present Nurse Communication: Mobility status for transfers;Mobility status for ambulation General Behavior During Session:  Lethargic Cognition: Impaired Cognitive Impairment: Pt with increased cognitive deficits this afternoon including memory loss and decreased problem solving. Pt unable to recall day of surgery. Cognitive deficits possibly due to meds, RN aware.  Milana Kidney 01/26/2012, 4:05 PM  01/26/2012 Milana Kidney DPT PAGER: [phone] OFFICE: [phone]

## 2012-01-26 NOTE — Progress Notes (Signed)
Occupational Therapy Treatment Patient Details Name: [name] [name] MRN: [id number] DOB: [birth year] Today's Date: 01/26/2012  OT Assessment/Plan OT Assessment/Plan Comments on Treatment Session: Pt continues to demonstrate significant cognitive impairments this afternoon likely due to medication.  SNF will be most appropriate d/c plan. OT Plan: Discharge plan remains appropriate OT Frequency: Min 2X/week Follow Up Recommendations: Skilled nursing facility Equipment Recommended: Defer to next venue OT Goals ADL Goals Pt Will Transfer to Toilet: with modified independence;with DME;3-in-1;Ambulation;Maintaining back safety precautions ADL Goal: Toilet Transfer - Progress: Not progressing (due to decreased cognition possibly due to medication)  OT Treatment Precautions/Restrictions  Precautions Precautions: Back Precaution Booklet Issued: Yes (comment) Precaution Comments: Pt had difficulty verbalizing back precautions; re-educated pt on proper back precautions Required Braces or Orthoses: Yes Spinal Brace: Lumbar corset;Applied in sitting position Restrictions Weight Bearing Restrictions: No   ADL ADL Toilet Transfer: Performed;+2 Total assistance;Comment for patient % (50%) Toilet Transfer Details (indicate cue type and reason): assist for anterior translation during transfer. VC for intiation of task. Toilet Transfer Method: Surveyor, minerals: Programme researcher, broadcasting/film/video Manipulation: Performed;Maximal assistance Toileting - Clothing Manipulation Details (indicate cue type and reason): assist for steadying and balance while pulling undergarment up over hips Where Assessed - Toileting Clothing Manipulation: Standing Toileting - Hygiene: Performed;+1 Total assistance Where Assessed - Toileting Hygiene: Standing Equipment Used: Rolling walker Ambulation Related to ADLs: Pt with increased pain during ambulation. ADL Comments: Pt with decreased cognition  and requires max VC's to initiate tasks. Mobility  Bed Mobility Bed Mobility: Yes Rolling Left: 2: Max assist Rolling Left Details (indicate cue type and reason): VC for proper sequencing. Max assist with manual facilitation at trunk and pelvis. Left Sidelying to Sit: 2: Max assist (30) Left Sidelying to Sit Details (indicate cue type and reason): VC for sequencing to maintain back precautions. Assist and stability at trunk as well as tactile cues through the pelvis. Support from back for balance upon sitting Sitting - Scoot to Edge of Bed: 3: Mod assist Sitting - Scoot to Edge of Bed Details (indicate cue type and reason): VC for weight shifting. Increased time to complete Sit to Sidelying Left: 4: Min assist Sit to Sidelying Left Details (indicate cue type and reason): Min assist with LEs to get into bed. VC in order to maintain back precautions Transfers Sit to Stand: 3: Mod assist;1: +2 Total assist;Patient percentage (comment);From bed;From chair/3-in-1 (50%) Sit to Stand Details (indicate cue type and reason): Mod assist at times up to +2 Assist, pt 50%. VC for anterior translation into standing. Assist for stability and initiation of movement. VC throughout for hand placement Stand to Sit: 3: Mod assist;With upper extremity assist;To chair/3-in-1;To bed Stand to Sit Details: VC for hand placement. Assist for controlled descent Exercises    End of Session General Behavior During Session: Lethargic Cognition: Impaired Cognitive Impairment: Pt with increased cognitive deficits this afternoon including memory loss and decreased problem solving. Pt unable to recall day of surgery. Cognitive deficits possibly due to meds, RN aware.   4:38 PM 01/26/2012 Cipriano Mile OTR/L Pager [phone] Office [phone]

## 2012-01-27 ENCOUNTER — Inpatient Hospital Stay (HOSPITAL_COMMUNITY): Payer: Medicare Other

## 2012-01-27 LAB — DIFFERENTIAL
Eosinophils Absolute: 0.1 10*3/uL (ref 0.0–0.7)
Eosinophils Relative: 2 % (ref 0–5)
Lymphocytes Relative: 14 % (ref 12–46)
Lymphs Abs: 1.1 10*3/uL (ref 0.7–4.0)
Monocytes Absolute: 0.7 10*3/uL (ref 0.1–1.0)
Monocytes Relative: 10 % (ref 3–12)

## 2012-01-27 LAB — SEDIMENTATION RATE: Sed Rate: 85 mm/hr — ABNORMAL HIGH (ref 0–22)

## 2012-01-27 LAB — COMPREHENSIVE METABOLIC PANEL
ALT: 24 U/L (ref 0–35)
BUN: 11 mg/dL (ref 6–23)
CO2: 27 mEq/L (ref 19–32)
Calcium: 8.8 mg/dL (ref 8.4–10.5)
Creatinine, Ser: 0.64 mg/dL (ref 0.50–1.10)
GFR calc Af Amer: >90 mL/min (ref >90)
GFR calc non Af Amer: >90 mL/min (ref >90)
Glucose, Bld: 178 mg/dL — ABNORMAL HIGH (ref 70–99)

## 2012-01-27 LAB — GLUCOSE, CAPILLARY
Glucose-Capillary: 152 mg/dL — ABNORMAL HIGH (ref 70–99)
Glucose-Capillary: 169 mg/dL — ABNORMAL HIGH (ref 70–99)
Glucose-Capillary: 150 mg/dL — ABNORMAL HIGH (ref 70–99)
Glucose-Capillary: 171 mg/dL — ABNORMAL HIGH (ref 70–99)

## 2012-01-27 LAB — CBC
HCT: 26.2 % — ABNORMAL LOW (ref 36.0–46.0)
MCH: 27.7 pg (ref 26.0–34.0)
MCV: 84.5 fL (ref 78.0–100.0)
RBC: 3.1 MIL/uL — ABNORMAL LOW (ref 3.87–5.11)
WBC: 7.6 10*3/uL (ref 4.0–10.5)

## 2012-01-27 MED ORDER — WHITE PETROLATUM GEL
Status: AC
  Administered 2012-01-27: 18:00:00
  Filled 2012-01-27: qty 5

## 2012-01-27 MED ORDER — VANCOMYCIN HCL 1000 MG IV SOLR
1250.0000 mg | Freq: Two times a day (BID) | INTRAVENOUS | Status: AC
  Administered 2012-01-27 – 2012-01-29 (×5): 1250 mg via INTRAVENOUS
  Filled 2012-01-27 (×6): qty 1250

## 2012-01-27 MED ORDER — LEVOFLOXACIN IN D5W 500 MG/100ML IV SOLN
500.0000 mg | INTRAVENOUS | Status: DC
  Administered 2012-01-27 – 2012-02-01 (×6): 500 mg via INTRAVENOUS
  Filled 2012-01-27 (×7): qty 100

## 2012-01-27 MED ORDER — CIPROFLOXACIN IN D5W 400 MG/200ML IV SOLN
400.0000 mg | Freq: Two times a day (BID) | INTRAVENOUS | Status: DC
  Administered 2012-01-27: 400 mg via INTRAVENOUS
  Filled 2012-01-27 (×2): qty 200

## 2012-01-27 NOTE — Progress Notes (Signed)
Patient has become somewhat more confused. Over the last 24 hours. Apparently she was more disoriented yesterday afternoon she's a little more clear today. She is currently a blood, air name and where she is. She is unclear about the time and date. She complains of back pain.  She is running low-grade fevers with a maximum temperature of 100.1. She began having serosanguineous discharge from the base of the wound. The wound itself is quite tender but there is any obvious erythema or purulence. She does not have evidence of meningismus. Straight leg raising does not cause severe pain. Her motor examination is intact. Chest and abdomen benign.  I'm concerned about her recent worsening. We will check a CBC and other labs including cultures and a chest x-ray to workup for fevers and confusion. I'm start her on Cipro and IV antibiotic prophylactically for wound drainage. "She is penicillin and cephalosporin allergic"

## 2012-01-27 NOTE — Progress Notes (Signed)
PT Cancel Note:  PT session cancelled per RN's request due to pt's confusion & pain.    [name] [name], [place] [phone] 01/27/2012

## 2012-01-27 NOTE — Progress Notes (Signed)
During shift report, day RN reported pt has some cognitive issues probably due to the pain medication taken earlier in the am. Pt is lethargic, only oriented to self, disoriented to time, place or situation. During the assessment this RN noticed pt is more alert, she is oriented x2, still doesn't know the date and sts she can't remember anything about surgery. Pt reoriented and pain assessed. Pt however sts, she can't take anything with Tylenol due to liver cirrhosis. Pt was given flexeril and morphine which she tolerated well. Pt still exhibits some signs of confusion and is occasionally crying in pain, however she goes back to sleep almost immediately afterwards. Will continue to monitor closely.

## 2012-01-27 NOTE — Plan of Care (Signed)
Problem: Phase I Progression Outcomes Goal: Pain controlled with appropriate interventions Outcome: Not Progressing Pt seems to be having reaction to pian meds, she is showing cognitive deterioration, pain medication should be readdressed. Goal: OOB as tolerated unless otherwise ordered Outcome: Not Progressing D/t pain

## 2012-01-27 NOTE — Progress Notes (Signed)
ANTIBIOTIC CONSULT NOTE - INITIAL  Pharmacy Consult for Vancomycin Indication: Fever/wound drainage/?PNA  Allergies  Allergen Reactions  . Azithromycin Swelling  . Cefuroxime Axetil Hives and Swelling  . Celecoxib Swelling  . Codeine Nausea And Vomiting  . Sulfa Antibiotics Swelling and Rash    "makes bottom of her feet swell up real bad"  . Hydralazine Hcl     unknown    Patient Measurements: Height: 5\' 3"  (160 cm) Weight: 208 lb (94.348 kg) IBW/kg (Calculated) : 52.4  Adjusted Body Weight: 65 kg  Vital Signs: Temp: 99.1 F (37.3 C) (03/10 1756) Temp src: Oral (03/10 1756) BP: 135/72 mmHg (03/10 1756) Pulse Rate: 91  (03/10 1756) Intake/Output from previous day:   Intake/Output from this shift:    Labs:  Basename 01/27/12 1149  WBC 7.6  HGB 8.6*  PLT 114*  LABCREA --  CREATININE 0.64   Estimated Creatinine Clearance: 76.6 ml/min (by C-G formula based on Cr of 0.64). No results found for this basename: VANCOTROUGH:2,VANCOPEAK:2,VANCORANDOM:2,GENTTROUGH:2,GENTPEAK:2,GENTRANDOM:2,TOBRATROUGH:2,TOBRAPEAK:2,TOBRARND:2,AMIKACINPEAK:2,AMIKACINTROU:2,AMIKACIN:2, in the last 72 hours   Microbiology: Recent Results (from the past 720 hour(s))  SURGICAL PCR SCREEN     Status: Normal   Collection Time   01/23/12  2:14 PM      Component Value Range Status Comment   MRSA, PCR NEGATIVE  NEGATIVE  Final    Staphylococcus aureus NEGATIVE  NEGATIVE  Final   URINE CULTURE     Status: Normal (Preliminary result)   Collection Time   01/26/12  3:02 PM      Component Value Range Status Comment   Specimen Description URINE, CLEAN CATCH   Final    Special Requests NONE   Final    Culture  Setup Time [id number]   Final    Colony Count 85,000 COLONIES/ML   Final    Culture ESCHERICHIA COLI   Final    Report Status PENDING   Incomplete     Medical History: Past Medical History  Diagnosis Date  . Sleep apnea   . Allergic rhinitis   . Depression   . Anxiety   . CHF  (congestive heart failure)   . Somnolence   . OSA (obstructive sleep apnea)   . Bronchitis   . Complication of anesthesia     low blood pressure once  . HTN (hypertension)     on medication since age 72  . Urinary, incontinence, stress female     wears depends  . Urinary incontinence, nocturnal enuresis   . Arthritis   . Idiopathic cirrhosis     stage 4; sees Dr. Charm Barges in [place]  . Splenomegaly   . Type II or unspecified type diabetes mellitus without mention of complication, not stated as uncontrolled   . GERD (gastroesophageal reflux disease)   . Fibromyalgia   . Hypercholesteremia   . Neuropathy, peripheral     lower extremities  . Enlarged heart   . Myocardial infarction     age 38    Medications:  Prescriptions prior to admission  Medication Sig Dispense Refill  . aspirin EC 81 MG tablet Take 81 mg by mouth daily.      Marland Kitchen buPROPion (WELLBUTRIN XL) 300 MG 24 hr tablet Take 300 mg by mouth daily.      . diclofenac-misoprostol (ARTHROTEC 75) 75-200 MG-MCG per tablet Take 1 tablet by mouth 2 (two) times daily.      Marland Kitchen diltiazem (DILACOR XR) 180 MG 24 hr capsule Take 180 mg by mouth at bedtime.      Marland Kitchen  ergocalciferol (VITAMIN D2) 50000 UNITS capsule Take 50,000 Units by mouth once a week. Monday      . Exenatide (BYDUREON) 2 MG SUSR Inject 2 mg into the skin once a week. On Monday      . ezetimibe (ZETIA) 10 MG tablet Take 5 mg by mouth at bedtime.       . fluticasone (FLONASE) 50 MCG/ACT nasal spray Place 2 sprays into the nose daily as needed. For allergies      . furosemide (LASIX) 20 MG tablet Take 20 mg by mouth daily.      Marland Kitchen gabapentin (NEURONTIN) 100 MG capsule Take 200-300 mg by mouth 3 (three) times daily. Take 200mg  in the morning and afternoon, take 300mg  at bedtime.      . isosorbide mononitrate (IMDUR) 60 MG 24 hr tablet Take 60 mg by mouth daily.      . lansoprazole (PREVACID) 30 MG capsule Take 30 mg by mouth 2 (two) times daily before a meal.       . metoprolol  (LOPRESSOR) 50 MG tablet Take 50 mg by mouth 2 (two) times daily.      . potassium chloride SA (K-DUR,KLOR-CON) 20 MEQ tablet Take 20 mEq by mouth 2 (two) times daily.      . rosuvastatin (CRESTOR) 10 MG tablet Take 5 mg by mouth daily.      . traMADol (ULTRAM) 50 MG tablet Take 50 mg by mouth every 6 (six) hours as needed. For pain      . valsartan-hydrochlorothiazide (DIOVAN-HCT) 160-12.5 MG per tablet Take 2 tablets by mouth daily.      Marland Kitchen venlafaxine (EFFEXOR) 50 MG tablet Take 50 mg by mouth daily.       Assessment: 66 y.o. Female s/p lumbar fusion on 3/7. Now with fever and more confusion and serosanguineous wound drainage. Order also states suspected pna. Urine cx growing Ecoli - sensitivities pending. Bld cx pending.To begin empiric antibiotics -  Levaquin/Vancomycin Day #1. Renal function stable. Wbc wnl.  Goal of Therapy:  Vancomycin trough level 15-20 mcg/ml  Plan:  1. Vancomycin 1250mg  IV q12h. First dose now 2. F/u microbiological data and  renal function  Christoper Fabian, PharmD, BCPS Clinical pharmacist, pager [phone] 01/27/2012,6:26 PM

## 2012-01-27 NOTE — Progress Notes (Signed)
CSW completed FL2 and initiated [facility] search. FL2 on chart for MD signature. Weekday CSW will f/u with offers.  Dellie Burns, MSW, [place] [phone] (weekend)

## 2012-01-28 LAB — URINE CULTURE
Colony Count: 85000
Culture  Setup Time: 201303092001

## 2012-01-28 LAB — GLUCOSE, CAPILLARY
Glucose-Capillary: 147 mg/dL — ABNORMAL HIGH (ref 70–99)
Glucose-Capillary: 140 mg/dL — ABNORMAL HIGH (ref 70–99)
Glucose-Capillary: 134 mg/dL — ABNORMAL HIGH (ref 70–99)

## 2012-01-28 MED ORDER — NABUMETONE 500 MG PO TABS
500.0000 mg | ORAL_TABLET | Freq: Two times a day (BID) | ORAL | Status: DC
  Administered 2012-01-28 – 2012-02-05 (×16): 500 mg via ORAL
  Filled 2012-01-28 (×18): qty 1

## 2012-01-28 MED FILL — Heparin Sodium (Porcine) Inj 1000 Unit/ML: INTRAMUSCULAR | Qty: 30 | Status: AC

## 2012-01-28 MED FILL — Sodium Chloride Irrigation Soln 0.9%: Qty: 3000 | Status: AC

## 2012-01-28 MED FILL — Sodium Chloride IV Soln 0.9%: INTRAVENOUS | Qty: 1000 | Status: AC

## 2012-01-28 NOTE — Progress Notes (Signed)
Filed Vitals:   01/27/12 2024 01/27/12 2215 01/28/12 0149 01/28/12 0544  BP: 128/66  126/80 143/68  Pulse: 64 65 68 97  Temp: 98.8 F (37.1 C)  98.6 F (37 C) 98.1 F (36.7 C)  TempSrc: Oral  Oral Oral  Resp: 20 18 20 20   Height:      Weight:      SpO2: 96% 96% 98% 94%    CBC  Basename 01/27/12 1149  WBC 7.6  HGB 8.6*  HCT 26.2*  PLT 114*   BMET  Basename 01/27/12 1149  NA 133*  K 3.6  CL 97  CO2 27  GLUCOSE 178*  BUN 11  CREATININE 0.64  CALCIUM 8.8    Patient complaining of significant, disabling back pain. No significant radicular pain. She's continued to have serosanguineous drainage from her wound, but there is no erythema or purulence. Dressing changed today by me and her nurse. Mobility in bed, better yet out of bed is very limited.  Urinalysis showed 3-6 WBC urine culture showed 85,000 colonies of Escherichia coli sensitive to all antibiotics. Blood cultures are negative so far. Patient on Levaquin and vancomycin. No fever since yesterday.  Spoke with patient about the importance of mobility and ambulation to avoid complications of DVT, PE, and pneumonia.  Plan: We'll add Relafen 500 mg twice a day as an anti-inflammatory since she was more comfortable when she had been on Toradol.  Hewitt Shorts, MD 01/28/2012, 10:53 AM

## 2012-01-28 NOTE — Progress Notes (Signed)
Physical Therapy Treatment Patient Details Name: [name] [name] MRN: [id number] DOB: May 17, 1946 Today's Date: 01/28/2012  PT Assessment/Plan  PT - Assessment/Plan Comments on Treatment Session: Pt with improved cognition although still limited per chart review. Pt extremely limited by pain, refuses all ambulation and to sit in chair for 20 min. PT able to get pt to stay in chair for 15 min with MAX verbal distraction. Pt frequently sobbing throughout treatment. Pt strongly educated on risks of immobility including pneumonia, SBO, blood clots, weakness. Pt reports that she doesn't care what we or the doctor have told her, she "knows all the facts and knows its bad for (her) but wants to get back in the bed." Pt then attempting to stand by self to get back to bed, PT assisted pt to bed.  PT Plan: Discharge plan needs to be updated;Frequency remains appropriate PT Frequency: Min 5X/week Follow Up Recommendations: Skilled nursing facility Equipment Recommended: Defer to next venue PT Goals  Acute Rehab PT Goals PT Goal: Supine/Side to Sit - Progress: Not progressing PT Goal: Sit to Supine/Side - Progress: Not progressing PT Goal: Sit to Stand - Progress: Not progressing PT Goal: Stand to Sit - Progress: Not progressing PT Transfer Goal: Bed to Chair/Chair to Bed - Progress: Progressing toward goal PT Goal: Ambulate - Progress: Not progressing  PT Treatment Precautions/Restrictions  Precautions Precautions: Back Precaution Booklet Issued: Yes (comment) Precaution Comments: Pt recalling 1/3 back precautions, verbally reviewed 3/3 precautions again. Pt with poor complience throughout session. Required Braces or Orthoses: Yes Spinal Brace: Lumbar corset;Applied in sitting position Restrictions Weight Bearing Restrictions: No Mobility (including Balance) Bed Mobility Bed Mobility: Yes Rolling Left: 4: Min assist Rolling Left Details (indicate cue type and reason): Verbal/tactile cues for  sequence. Pt yelling out "don't pull on me, don't pull on me" prior to PT approaching pt.  Left Sidelying to Sit: 2: Max assist (30) Left Sidelying to Sit Details (indicate cue type and reason): First attempt failed, pt bracing against motion by utilizing rail. Secondary attempt successful, pt painful throughout. Verbal cues for precautions.  Sitting - Scoot to Edge of Bed: 2: Max assist Sitting - Scoot to [place] of Bed Details (indicate cue type and reason): Max verbal cues and assist to scoot and properly align pt at EOB. Sit to Sidelying Left: 4: Min assist Sit to Sidelying Left Details (indicate cue type and reason): Min assist with LEs to get into bed. VC in order to maintain back precautions  Transfers Transfers: Yes Sit to Stand: 3: Mod assist;1: +2 Total assist;Patient percentage (comment);From bed;From chair/3-in-1 (50%) Sit to Stand Details (indicate cue type and reason): Performed 3 times. Pt = 50%. Verbal cues to minimize excessive forward trunk flexion. VC throughout for hand placement. Stand to Sit: 3: Mod assist;With upper extremity assist;To chair/3-in-1;To bed Stand to Sit Details: x3. cues for UE placement. Assist to control descent.  Stand Pivot Transfers: 1: +2 Total assist;Patient percentage (comment) (50%) Stand Pivot Transfer Details (indicate cue type and reason): pt = 75%, assist for stability of pt, positioning of RW. Performed 3x Ambulation/Gait Ambulation/Gait: No (Multiple/MAX encouragement to perform, pt refuses)    Exercise  General Exercises - Lower Extremity Ankle Circles/Pumps: AROM;Both;Other reps (comment);Seated (8 reps) Long Arc Quad: AROM;Both;10 reps;Seated End of Session PT - End of Session Equipment Utilized During Treatment: Gait belt;Back brace Activity Tolerance: Patient limited by fatigue;Patient limited by pain Patient left: in bed;with call bell in reach;with family/visitor present Nurse Communication: Mobility status for transfers;Mobility  status for ambulation General Behavior During Session: Lethargic Cognition: Impaired Cognitive Impairment: Pt with evident decreased memory and safety awareness throughout. Thought today was her first day. Pt with depressive statements, RN made aware.  Sherrine Maples Cheek 01/28/2012, 3:34 PM  Sherie Don) Carleene Mains PT, DPT Acute Rehabilitation [phone]

## 2012-01-28 NOTE — Progress Notes (Signed)
PT Note: Pt with depressive statements throughout treatment including but not limited to:   "They could have used this bed for somebody better"  "The pain just makes me want to slit my wrists"  "I am never going to walk again, I give up"  RN made aware. Question if pt would have better participation if this is further addressed by medical team.  Sherie Don) Carleene Mains PT, DPT Acute Rehabilitation [phone]

## 2012-01-28 NOTE — Progress Notes (Signed)
Placed pt on CPAP via home mask, 9.0 cm H20 with 2 lpm O2 bleed in.  Pt. Tolerating well at this time.

## 2012-01-28 NOTE — Progress Notes (Signed)
CSW provided bed offers. CSW will send additional information needed to facility of pt's choice. CSW will also follow up on [facility] # needed for SNF placement. CSW will continue to follow to facilitate discharge to SNF when medically ready.  Dede Query, MSW, [place] Majors [phone]

## 2012-01-29 LAB — GLUCOSE, CAPILLARY
Glucose-Capillary: 125 mg/dL — ABNORMAL HIGH (ref 70–99)
Glucose-Capillary: 123 mg/dL — ABNORMAL HIGH (ref 70–99)
Glucose-Capillary: 128 mg/dL — ABNORMAL HIGH (ref 70–99)
Glucose-Capillary: 135 mg/dL — ABNORMAL HIGH (ref 70–99)

## 2012-01-29 LAB — VANCOMYCIN, TROUGH: Vancomycin Tr: 11.2 ug/mL (ref 10.0–20.0)

## 2012-01-29 MED ORDER — VANCOMYCIN HCL IN DEXTROSE 1-5 GM/200ML-% IV SOLN
1000.0000 mg | Freq: Three times a day (TID) | INTRAVENOUS | Status: DC
  Administered 2012-01-30 – 2012-01-31 (×4): 1000 mg via INTRAVENOUS
  Filled 2012-01-29 (×6): qty 200

## 2012-01-29 NOTE — Progress Notes (Signed)
Physical Therapy Treatment Patient Details Name: [name] [name] MRN: [id number] DOB: Mar 24, 1946 Today's Date: 01/29/2012  PT Assessment/Plan  PT - Assessment/Plan Comments on Treatment Session: Very poor cognition and safety awareness today, pt placing herself in positions where she could have easily fallen secondary to impulsivity. Every transition including bed mobility takes > 5-10 repetitions with MAX verbal encouragement, pt sobbing throughout. Pt becomes more confused with increased mobility. Husband in room today - very concerned about pt's confusion. Discussed with RN thoroughly.  PT Plan: Discharge plan needs to be updated;Frequency remains appropriate PT Frequency: Min 5X/week Follow Up Recommendations: Skilled nursing facility Equipment Recommended: Defer to next venue PT Goals  Acute Rehab PT Goals Pt will go Supine/Side to Sit: with modified independence PT Goal: Supine/Side to Sit - Progress: Not progressing Pt will go Sit to Supine/Side: with modified independence PT Goal: Sit to Supine/Side - Progress: Not progressing Pt will go Sit to Stand: with modified independence PT Goal: Sit to Stand - Progress: Not progressing Pt will go Stand to Sit: with modified independence PT Goal: Stand to Sit - Progress: Not progressing Pt will Transfer Bed to Chair/Chair to Bed: with supervision PT Transfer Goal: Bed to Chair/Chair to Bed - Progress: Progressing toward goal Pt will Ambulate: >150 feet;with supervision;with least restrictive assistive device PT Goal: Ambulate - Progress: Not progressing  PT Treatment Precautions/Restrictions  Precautions Precautions: Back Precaution Booklet Issued: Yes (comment) Precaution Comments: Pt not recalling back precautions, verbally reviewed 3/3 precautions again. Pt with poor complience throughout session. Required Braces or Orthoses: Yes Spinal Brace: Lumbar corset;Applied in sitting position Restrictions Weight Bearing Restrictions:  No Mobility (including Balance) Bed Mobility Bed Mobility: Yes Rolling Left: 3: Mod assist Rolling Left Details (indicate cue type and reason): Verbal/tactile cues for sequence. >5 attempts required to perform roll as pt pushing against movement and throws self back into supine. Assist through pad, VERY slow progression Left Sidelying to Sit: 1: +2 Total assist;Patient percentage (comment);HOB flat (pt= 30) Left Sidelying to Sit Details (indicate cue type and reason): Pt required >9 attempts before she was able to obtain full sitting position. Pt then repeatedly attempting to return to bed. Pt yelling out "I can't" over and over. Pt given continual encouragement Sitting - Scoot to Edge of Bed: 2: Max assist Sitting - Scoot to [place] of Bed Details (indicate cue type and reason): Max verbal cues and assist to scoot and properly align pt at EOB. Pt continues to try to return to sidelying. PT to block Rt. knee as pt making unsafe forward hip advances getting close to EOB Sit to Supine: 2: Max assist Sit to Supine - Details (indicate cue type and reason): max assist to attempt to keep pt safe secondary to impulsivity with getting back to bed. Pt violating all back precautions.  Transfers Transfers: Yes Sit to Stand: 1: +2 Total assist;Patient percentage (comment);From bed;From chair/3-in-1;2: Max assist Sit to Stand Details (indicate cue type and reason): Performed 3 times. Pt = 50%. Verbal cues to minimize excessive forward trunk flexion. VC throughout for hand placement. Pt with increased contribution when she decided to get back in the bed. Pt with unsafe Lt. UE placement in standing - she wraps her Lt. arm under hand support (Very unsafe).  Stand to Sit: With upper extremity assist;To chair/3-in-1;To bed;1: +2 Total assist Stand to Sit Details: pt = 40%. max verbal cues for sequence and safety, pt with decreased complience of all. Pt sitting on very EOB in very unsafe manor  secondary to impulsivity to  get back in bed, PT to block Rt knee and direct pelvis to prevent fall to floor. Stand Pivot Transfers: 1: +2 Total assist;Patient percentage (comment) Stand Pivot Transfer Details (indicate cue type and reason): pt = 60%. First attempt well controlled. Pt then became anxious about getting in chair or walking, impulsively transfered self back to bed in very flexed posture with UEs inappropriately placed on RW.  Ambulation/Gait Ambulation/Gait: No (Multiple/MAX encouragement to perform, pt refuses (sobs))    End of Session PT - End of Session Equipment Utilized During Treatment: Gait belt;Back brace Activity Tolerance: Patient limited by fatigue;Patient limited by pain;Treatment limited secondary to medical complications (Comment) (confusion) Patient left: in bed;with call bell in reach Nurse Communication: Mobility status for transfers;Mobility status for ambulation General Behavior During Session: Lethargic Cognition: Impaired Cognitive Impairment: Pt with evident decreased memory and SIGNIFICANT decreased safety awareness throughout. Pt with decreased recall. While sitting on EOB pt reports she is on the bed pan while goal was to get to 3n1.   Sherrine Maples Cheek 01/29/2012, 5:44 PM  Sherie Don) Carleene Mains PT, DPT Acute Rehabilitation [phone]

## 2012-01-29 NOTE — Progress Notes (Signed)
PT Treatment Note:  Spoke with nursing tech who was successful getting pt to stay in chair earlier this morning. We agreed to attempt together to get pt to chair again for supper as she refused after earlier PT session to stay in chair. Pt appears to have uncontrolled pain, sobs throughout treatment session and is becoming quite unsafe at times secondary to impaired cognition, safety awareness, and impulsivity. Pt able to tolerate sitting in chair at end of treatment, getting there was quite a challenge.   MD - Spoke with husband, he reports after last surgery pt was having lots of pain in her Lt. Hip and that she received a cortisone shot which caused her to "turn a corner, eliminated her pain." Currently her Rt. Hip/thigh hurts and her husband wonders if this could help again given her arthritis. Husband also very concerned about her impaired cognition.  Supine to Lt. Side = max assist, multiple attempts required with pt returning to supine >8 times. Verbal cues same as earlier session Lt. Side to sit = Total assist +2, pt = 20%. Multiple attempts again required, HOB elevated to ~50 degrees.  Pt sitting EOB for approximately 10 minutes while attempting to don brace and gait belt as pt repeatedly laid back onto side (against elevated HOB). Pt with decreased ability to follow commands to sit midline.  Sit to stand: Total assist +2, pt = 30%. Max verbal cues and assist to promote upright standing, PT to place Lt. UE as pt not following cues to place Lt. Hand in safe position on RW.  Stand to sit: Total assist +2, pt = 50%.  Stand pivot bed to 3n1 = Total assist +3 pt = 40% (2 persons to assist in standing, one to align safely as pt was impulsive).  Stand pivot 3n1 to chair = Total assist +2 pt = 50%. Max encouragement and cues for sequencing. PT to position walker.   Max verbal/tactile cues throughout for back precautions and pt has minimal adherence to during functional activity. Cues for slow  controlled breathing throughout to assist with anxiety/pain modulation  Pt left in chair with call bell in reach with nursing. Discharge plans and frequency remain intact.  Thanks,  Dahlia Client (Beverely Pace) Carleene Mains PT, DPT Acute Rehabilitation [phone]

## 2012-01-29 NOTE — Progress Notes (Signed)
CSW met with pt and pt's daughter to discuss discharge plan. Pt's first choice of SNF, [facility], is able to make a bed offer, but does not have availability at this time. Admissions coordinator would like CSW to keep her updated regarding pt's progress. CSW spoke to pt about the importance of participating in physical therapy. Pt's daughter brought up concerns about pt's current mental status. Pt appears to be confused at times, but is oriented x3. Pt's daughter shared that pt is not at her baseline. Pt's daughter also shared that she has become "paranoid." Pt's daughter confirmed that this is something that occurred after her surgery and pt does not have a history of these behaviors. CSW consulted with the nurse, who has contacted the MD regarding this. CSW will attempted to reach MD. CSW will continue to follow.   Dede Query, MSW, Theresia Majors [phone]

## 2012-01-29 NOTE — Progress Notes (Signed)
PT did not want to go on CPAP at this time. However PT says she can put her own mask on herself when she is ready to go on the machine. Rt told PT to call if she needs any help when she is ready for it. RT will continue to monitor.

## 2012-01-29 NOTE — Progress Notes (Signed)
ANTIBIOTIC CONSULT NOTE - FOLLOW UP  Pharmacy Consult for Vancomycin Indication: Possible PNA; Fever/wound drainage  Allergies  Allergen Reactions  . Azithromycin Swelling  . Cefuroxime Axetil Hives and Swelling  . Celecoxib Swelling  . Codeine Nausea And Vomiting  . Sulfa Antibiotics Swelling and Rash    "makes bottom of her feet swell up real bad"  . Hydralazine Hcl     unknown    Patient Measurements: Height: 5\' 3"  (160 cm) Weight: 208 lb (94.348 kg) IBW/kg (Calculated) : 52.4    Vital Signs: Temp: 98.6 F (37 C) (03/12 1750) Temp src: Oral (03/12 1750) BP: 143/81 mmHg (03/12 1750) Pulse Rate: 88  (03/12 1750)  Intake/Output from previous day: 03/11 0701 - 03/12 0700 In: 350 [I.V.:100; IV Piggyback:250] Out: -    Labs:  Basename 01/27/12 1149  WBC 7.6  HGB 8.6*  PLT 114*  LABCREA --  CREATININE 0.64   Estimated Creatinine Clearance: 76.6 ml/min (by C-G formula based on Cr of 0.64).  Vancomycin trough level  =  11.2 mcg/ml  Microbiology: Recent Results (from the past 720 hour(s))  SURGICAL PCR SCREEN     Status: Normal   Collection Time   01/23/12  2:14 PM      Component Value Range Status Comment   MRSA, PCR NEGATIVE  NEGATIVE  Final    Staphylococcus aureus NEGATIVE  NEGATIVE  Final   URINE CULTURE     Status: Normal   Collection Time   01/26/12  3:02 PM      Component Value Range Status Comment   Specimen Description URINE, CLEAN CATCH   Final    Special Requests NONE   Final    Culture  Setup Time [id number]   Final    Colony Count 85,000 COLONIES/ML   Final    Culture ESCHERICHIA COLI   Final    Report Status 01/28/2012 FINAL   Final    Organism ID, Bacteria ESCHERICHIA COLI   Final   CULTURE, BLOOD (ROUTINE X 2)     Status: Normal (Preliminary result)   Collection Time   01/27/12 10:55 AM      Component Value Range Status Comment   Specimen Description BLOOD LEFT ARM   Final    Special Requests BOTTLES DRAWN AEROBIC ONLY 6CC   Final    Culture  Setup Time [id number]   Final    Culture     Final    Value:        BLOOD CULTURE RECEIVED NO GROWTH TO DATE CULTURE WILL BE HELD FOR 5 DAYS BEFORE ISSUING A FINAL NEGATIVE REPORT   Report Status PENDING   Incomplete   CULTURE, BLOOD (ROUTINE X 2)     Status: Normal (Preliminary result)   Collection Time   01/27/12 11:10 AM      Component Value Range Status Comment   Specimen Description BLOOD LEFT HAND   Final    Special Requests BOTTLES DRAWN AEROBIC ONLY [facility]   Final    Culture  Setup Time [id number]   Final    Culture     Final    Value:        BLOOD CULTURE RECEIVED NO GROWTH TO DATE CULTURE WILL BE HELD FOR 5 DAYS BEFORE ISSUING A FINAL NEGATIVE REPORT   Report Status PENDING   Incomplete     Anti-infectives     Start     Dose/Rate Route Frequency Ordered Stop   01/27/12 1900   levofloxacin (LEVAQUIN) IVPB 500  mg        500 mg 100 mL/hr over 60 Minutes Intravenous Every 24 hours 01/27/12 1820     01/27/12 1900   vancomycin (VANCOCIN) 1,250 mg in sodium chloride 0.9 % 250 mL IVPB        1,250 mg 166.7 mL/hr over 90 Minutes Intravenous Every 12 hours 01/27/12 1836     01/27/12 1200   ciprofloxacin (CIPRO) IVPB 400 mg  Status:  Discontinued        400 mg 200 mL/hr over 60 Minutes Intravenous 2 times daily 01/27/12 1042 01/27/12 1820   01/24/12 0809   bacitracin 50,000 Units in sodium chloride irrigation 0.9 % 500 mL irrigation  Status:  Discontinued          As needed 01/24/12 0809 01/24/12 1213   01/24/12 0730   gentamicin (GARAMYCIN) IVPB 80 mg  Status:  Discontinued        80 mg 100 mL/hr over 30 Minutes Intravenous To Surgery 01/24/12 0729 01/24/12 1342   01/24/12 0716   bacitracin 16109 UNITS injection     Comments: WALSH, AMY: cabinet override         01/24/12 0716 01/24/12 1929   01/24/12 0000   vancomycin (VANCOCIN) IVPB 1000 mg/200 mL premix        1,000 mg 200 mL/hr over 60 Minutes Intravenous 120 min pre-op 01/23/12 1445 01/24/12 0751           Assessment:  66 y.o. Female s/p lumbar fusion on 3/7  Cultures NGTD.  Vancomycin trough SUBtherapeutic for PNA coverage    11.2 mcg/ml  Goal of Therapy:   Vancomycin trough level 15-20 mcg/ml  Plan:   Change Vancomycin to 1 gm IV q 8 hours.  F/U Vancomycin trough level if antibiotic continued.  Terisha Losasso, Elisha Headland, Pharm.D. 01/29/2012 8:34 PM

## 2012-01-29 NOTE — Progress Notes (Signed)
Filed Vitals:   01/28/12 1800 01/28/12 2200 01/29/12 0200 01/29/12 0600  BP: 129/88 148/78 125/64 138/84  Pulse: 90 88 67 86  Temp: 98.9 F (37.2 C) 98.1 F (36.7 C) 98.4 F (36.9 C) 99.1 F (37.3 C)  TempSrc: Oral Oral Oral Oral  Resp: 17 19 19 19   Height:      Weight:      SpO2: 93% 96% 96% 96%    Patient resting in bed, not complaining of pain as much as yesterday, but also with some confusion. Nursing reports that dressing was changed earlier this morning by the night nurse. They will continue to change as needed as well as every morning. Patient continues to be afebrile.  Moving all extremities well.  Blood cultures continued to show no growth, final result pending.  Plan: Strongly encouraged patient to work with the nursing and physical therapy staff on mobility and ambulation. It was again explained to her their critical importance so as to avoid serious complications.  Hewitt Shorts, MD 01/29/2012, 8:42 AM

## 2012-01-30 ENCOUNTER — Inpatient Hospital Stay (HOSPITAL_COMMUNITY): Payer: Medicare Other

## 2012-01-30 LAB — GLUCOSE, CAPILLARY
Glucose-Capillary: 145 mg/dL — ABNORMAL HIGH (ref 70–99)
Glucose-Capillary: 127 mg/dL — ABNORMAL HIGH (ref 70–99)
Glucose-Capillary: 82 mg/dL (ref 70–99)
Glucose-Capillary: 102 mg/dL — ABNORMAL HIGH (ref 70–99)

## 2012-01-30 NOTE — Progress Notes (Signed)
Patient was found on the Floor right after end of shift around 1945, she was asked how she got on the floor and she said someone was telling her to get out of bed and follow her. She is alert and oriented  But cognitive impaired. Assessments was done and no physical injury was sustained. MD on call notified but was busy so no action was required. Waiting for response from the MD.

## 2012-01-30 NOTE — Progress Notes (Signed)
Sitting outside pt room at work station and was called intoroom [id number] by Psychologist, sport and exercise. Pt stated she "fell and broke her hip" and complaining of R thigh pain as well.Pt was sitting on BSC no bruising or errythema to R hip or thigh noted. Nurse tech said she helped pt to standing position then pt sat down hard on the [facility] catching her R hip area on the arm rest of the BSC. I was sitting right outside the room and did not hear any loud noises,disturbances, or commotion. Pt is A and O x3 with periods of confusing ie talking to her family in her room when they aren't there, confused at where she is, thinking she is going home, asking to go back to her original room, and complaining of right leg pain for the last two days when moving. Pt has been very emotional, crying, stating "something is bad wrong with me I wish they could figure it out", "I am such a bother I can't believe I can't get up and walk". Every time pt has interaction with staff she becomes tearful and very emotional. As soon as caregivers offer emotional support she calms back down. Pt is inaccurate historian. At this time pt is stable will continue to monitor. Estanislado Spire

## 2012-01-30 NOTE — Progress Notes (Signed)
Occupational Therapy Treatment Patient Details Name: [name] [name] MRN: [id number] DOB: May 04, 1946 Today's Date: 01/30/2012  OT Assessment/Plan OT Assessment/Plan Comments on Treatment Session: Pt making very slow progress toward goals due to decreased cognition and decreased safety awareness. Very impulsive during functional transfers. OT Plan: Discharge plan remains appropriate OT Frequency: Min 2X/week Follow Up Recommendations: Skilled nursing facility Equipment Recommended: Defer to next venue OT Goals ADL Goals Pt Will Transfer to Toilet: with modified independence;with DME;3-in-1;Ambulation;Maintaining back safety precautions ADL Goal: Toilet Transfer - Progress: Progressing toward goals  OT Treatment Precautions/Restrictions  Precautions Precautions: Back Precaution Comments: Pt not recalling back precautions, verbally reviewed 3/3 precautions again. Pt with very poor complience throughout session. Required Braces or Orthoses: Yes Spinal Brace: Lumbar corset;Applied in sitting position   ADL ADL Toilet Transfer: Performed;+2 Total assistance;Comment for patient % (50) Toilet Transfer Details (indicate cue type and reason): assist for safety. pt with significantly flexed trunk and not adhering to back precautions despite max VC's Toilet Transfer Method: Stand pivot Acupuncturist: Bedside commode Mobility   Transfers Sit to Stand: 1: +2 Total assist;Patient percentage (comment);From chair/3-in-1 (pt = 70%) Sit to Stand Details (indicate cue type and reason): Assist for safety secondary to impulsivity. Pt very impulsive with stand from bedside commode. Pt attempting to stand both times without command and against cues. Cues for safe UE placement.  Stand to Sit: With upper extremity assist;To chair/3-in-1;1: +2 Total assist Stand to Sit Details: Performed 2x. pt = 50%. Pt again with very impulsive movements without regard for safety.  Exercises Other  Exercises Other Exercises: Pt does not tolerate further exercise  End of Session OT - End of Session Equipment Utilized During Treatment: Back brace Activity Tolerance: Patient limited by fatigue;Patient limited by pain Patient left: in chair;with call bell in reach;with family/visitor present Nurse Communication: Other (comment);Mobility status for transfers (to use lift for transfer) General Behavior During Session: Agitated Cognition: Impaired Cognitive Impairment: Pt extremely labile and screaming throughout session during rest break and with activity.  Pt demonstrating extremely unsafe and impulsive behavior and unable to adhere to back precautions.    4:39 PM 01/30/2012 Cipriano Mile OTR/L Pager [phone] Office [phone]

## 2012-01-30 NOTE — Progress Notes (Signed)
Physical Therapy Treatment Patient Details Name: [name] [name] MRN: [id number] DOB: 1946-01-19 Today's Date: 01/30/2012  PT Assessment/Plan  PT - Assessment/Plan Comments on Treatment Session: Pt starts treatment agreeable however as soon as she begins to roll she wails secondary to her pain. Wailing/squaking continues throughout treatment however able to get her to sit in chair for 1 hour today. Pt again with significant impulsiveness and decreased safety awareness requiring skilled hands to keep her safe. Spoke with RN, RN to get pt back in bed with lift and attempt to get pt to chair again later today with lift.  PT Plan: Discharge plan needs to be updated;Frequency remains appropriate PT Frequency: Min 5X/week Follow Up Recommendations: Skilled nursing facility Equipment Recommended: Defer to next venue PT Goals  Acute Rehab PT Goals Pt will go Supine/Side to Sit: with modified independence PT Goal: Supine/Side to Sit - Progress: Progressing toward goal Pt will go Sit to Stand: with modified independence PT Goal: Sit to Stand - Progress: Progressing toward goal Pt will go Stand to Sit: with modified independence PT Goal: Stand to Sit - Progress: Progressing toward goal Pt will Transfer Bed to Chair/Chair to Bed: with supervision PT Transfer Goal: Bed to Chair/Chair to Bed - Progress: Progressing toward goal Pt will Ambulate: >150 feet;with supervision;with least restrictive assistive device PT Goal: Ambulate - Progress: Not progressing  PT Treatment Precautions/Restrictions  Precautions Precautions: Back Precaution Booklet Issued: Yes (comment) Precaution Comments: Pt not recalling back precautions, verbally reviewed 3/3 precautions again. Pt with very poor complience throughout session. Required Braces or Orthoses: Yes Spinal Brace: Lumbar corset;Applied in sitting position Restrictions Weight Bearing Restrictions: No Mobility (including Balance) Bed Mobility Bed Mobility:  Yes Rolling Left: 3: Mod assist Rolling Left Details (indicate cue type and reason): Verbal/tactile cues for sequence. >8 attempts required to perform roll as pt pushing against movement and throws self back into supine. Assist through pad, VERY slow progression  Left Sidelying to Sit: 1: +2 Total assist;Patient percentage (comment);HOB elevated (comment degrees) (HOB = 50) Left Sidelying to Sit Details (indicate cue type and reason): Pt required >10 attempts before she was able to obtain full sitting position evem thoug given head start with HOB elevated. Pt then repeatedly attempting to return to bed. pt = 30% Transfers Transfers: Yes Sit to Stand: 1: +2 Total assist;Patient percentage (comment);From bed;From chair/3-in-1 (pt = 70%) Sit to Stand Details (indicate cue type and reason): Assist for safety secondary to impulsivity. Pt very impulsive with stand from bedside commode. Pt attempting to stand both times without command and against cues. Cues for safe UE placement.  Stand to Sit: With upper extremity assist;To chair/3-in-1;1: +2 Total assist Stand to Sit Details: Performed 2x. pt = 50%. Pt again with very impulsive movements without regard for safety.  Stand Pivot Transfers: 1: +2 Total assist;Patient percentage (comment) Stand Pivot Transfer Details (indicate cue type and reason): Performed 2x (Bed/3n1; 3n1/chair) Pt again impulsive with decreased adherence of back precautions remaining with flexed trunk.  Ambulation/Gait Ambulation/Gait: No (Multiple/MAX encouragement to perform, pt refuses (sobs))    Exercise  Other Exercises Other Exercises: Pt does not tolerate further exercise End of Session PT - End of Session Equipment Utilized During Treatment: Gait belt;Back brace Activity Tolerance: Patient limited by fatigue;Patient limited by pain;Treatment limited secondary to medical complications (Comment) (confusion) Patient left: with call bell in reach;in chair Nurse Communication:  Mobility status for transfers;Mobility status for ambulation General Behavior During Session: Agitated Cognition: Impaired Cognitive Impairment: Pt with evident  decreased memory and SIGNIFICANT decreased safety awareness throughout. Pt with decreased recall. Pt thinks red socks are blood pouring out of her.   Sherrine Maples Cheek 01/30/2012, 4:14 PM  Sherie Don) Carleene Mains PT, DPT Acute Rehabilitation [phone]

## 2012-01-30 NOTE — Progress Notes (Signed)
Filed Vitals:   01/29/12 1750 01/29/12 2158 01/30/12 0230 01/30/12 0510  BP: 143/81 135/72 125/77 119/78  Pulse: 88 90 82 71  Temp: 98.6 F (37 C) 98.2 F (36.8 C) 99.8 F (37.7 C) 98.7 F (37.1 C)  TempSrc: Oral Oral Oral Oral  Resp: 19 18 22 20   Height:      Weight:      SpO2: 98% 92% 93% 93%    Patient continuing to make slow progress. She did get up and out of bed to chair with assistance several times yesterday. However she complains of pain in the right buttock, hip, and thigh, and turning in bed is quite painful for her.  Patient is awake and oriented, but still has periods of confusion.  Her dressing was changed once yesterday and at this point shows mild shadowing and we will have the nursing change dressing later this morning.  Plan: We'll check AP and lateral lumbar spine x-rays today. Continuing physical therapy, have encouraged patient strongly once again to work hard to make progress with therapy. Spoke with the patient and her husband who was present throughout my visit today and answered their questions regarding her condition and our plans for treatment and care.  Hewitt Shorts, MD 01/30/2012, 9:14 AM

## 2012-01-30 NOTE — Progress Notes (Signed)
Utilization review completed. Aymara Sassi, RN, BSN. 01/30/12 

## 2012-01-30 NOTE — Progress Notes (Signed)
Patient was taken over from the night staff being AOX3 but confused and making depressive comments and hallucinating. MD aware of situation and family uncomfortable with patient's condition. Will continue to monitor.

## 2012-01-31 LAB — BASIC METABOLIC PANEL
BUN: 7 mg/dL (ref 6–23)
CO2: 27 mEq/L (ref 19–32)
Calcium: 8.5 mg/dL (ref 8.4–10.5)
Chloride: 100 mEq/L (ref 96–112)
Creatinine, Ser: 0.63 mg/dL (ref 0.50–1.10)
GFR calc Af Amer: >90 mL/min (ref >90)
GFR calc non Af Amer: >90 mL/min (ref >90)
Glucose, Bld: 146 mg/dL — ABNORMAL HIGH (ref 70–99)
Potassium: 3.3 mEq/L — ABNORMAL LOW (ref 3.5–5.1)
Sodium: 134 mEq/L — ABNORMAL LOW (ref 135–145)

## 2012-01-31 LAB — VANCOMYCIN, TROUGH: Vancomycin Tr: 20.6 ug/mL — ABNORMAL HIGH (ref 10.0–20.0)

## 2012-01-31 LAB — GLUCOSE, CAPILLARY
Glucose-Capillary: 117 mg/dL — ABNORMAL HIGH (ref 70–99)
Glucose-Capillary: 77 mg/dL (ref 70–99)
Glucose-Capillary: 116 mg/dL — ABNORMAL HIGH (ref 70–99)
Glucose-Capillary: 118 mg/dL — ABNORMAL HIGH (ref 70–99)
Glucose-Capillary: 103 mg/dL — ABNORMAL HIGH (ref 70–99)
Glucose-Capillary: 100 mg/dL — ABNORMAL HIGH (ref 70–99)

## 2012-01-31 MED ORDER — VANCOMYCIN HCL 1000 MG IV SOLR
750.0000 mg | Freq: Three times a day (TID) | INTRAVENOUS | Status: DC
  Administered 2012-01-31 – 2012-02-03 (×8): 750 mg via INTRAVENOUS
  Filled 2012-01-31 (×10): qty 750

## 2012-01-31 NOTE — Progress Notes (Signed)
CSW spoke with [facility] regarding possible discharge next week. CSW sent additional PT notes to SNF for review. CSW will continue to follow to facilitate discharge to SNF when medically ready.   Dede Query, MSW, Theresia Majors [phone]

## 2012-01-31 NOTE — Progress Notes (Signed)
ANTIBIOTIC CONSULT NOTE - FOLLOW UP  Pharmacy Consult for Vancomycin Indication: Possible PNA; Fever/wound drainage  Allergies  Allergen Reactions  . Azithromycin Swelling  . Cefuroxime Axetil Hives and Swelling  . Celecoxib Swelling  . Codeine Nausea And Vomiting  . Sulfa Antibiotics Swelling and Rash    "makes bottom of her feet swell up real bad"  . Hydralazine Hcl     unknown    Patient Measurements: Height: 5\' 3"  (160 cm) Weight: 208 lb (94.348 kg) IBW/kg (Calculated) : 52.4    Vital Signs: Temp: 98.5 F (36.9 C) (03/14 0931) Temp src: Oral (03/14 0318) BP: 134/74 mmHg (03/14 0931) Pulse Rate: 87  (03/14 0931)  Intake/Output from previous day: 03/13 0701 - 03/14 0700 In: 60 [P.O.:60] Out: -    Labs:  Basename 01/31/12 1040  WBC --  HGB --  PLT --  LABCREA --  CREATININE 0.63   Estimated Creatinine Clearance: 76.6 ml/min (by C-G formula based on Cr of 0.63).  Vancomycin trough level  =  20  Microbiology: Recent Results (from the past 720 hour(s))  SURGICAL PCR SCREEN     Status: Normal   Collection Time   01/23/12  2:14 PM      Component Value Range Status Comment   MRSA, PCR NEGATIVE  NEGATIVE  Final    Staphylococcus aureus NEGATIVE  NEGATIVE  Final   URINE CULTURE     Status: Normal   Collection Time   01/26/12  3:02 PM      Component Value Range Status Comment   Specimen Description URINE, CLEAN CATCH   Final    Special Requests NONE   Final    Culture  Setup Time [id number]   Final    Colony Count 85,000 COLONIES/ML   Final    Culture ESCHERICHIA COLI   Final    Report Status 01/28/2012 FINAL   Final    Organism ID, Bacteria ESCHERICHIA COLI   Final   CULTURE, BLOOD (ROUTINE X 2)     Status: Normal (Preliminary result)   Collection Time   01/27/12 10:55 AM      Component Value Range Status Comment   Specimen Description BLOOD LEFT ARM   Final    Special Requests BOTTLES DRAWN AEROBIC ONLY 6CC   Final    Culture  Setup Time [id number]    Final    Culture     Final    Value:        BLOOD CULTURE RECEIVED NO GROWTH TO DATE CULTURE WILL BE HELD FOR 5 DAYS BEFORE ISSUING A FINAL NEGATIVE REPORT   Report Status PENDING   Incomplete   CULTURE, BLOOD (ROUTINE X 2)     Status: Normal (Preliminary result)   Collection Time   01/27/12 11:10 AM      Component Value Range Status Comment   Specimen Description BLOOD LEFT HAND   Final    Special Requests BOTTLES DRAWN AEROBIC ONLY [facility]   Final    Culture  Setup Time [id number]   Final    Culture     Final    Value:        BLOOD CULTURE RECEIVED NO GROWTH TO DATE CULTURE WILL BE HELD FOR 5 DAYS BEFORE ISSUING A FINAL NEGATIVE REPORT   Report Status PENDING   Incomplete     Anti-infectives     Start     Dose/Rate Route Frequency Ordered Stop   01/30/12 0300   vancomycin (VANCOCIN) IVPB 1000 mg/200 mL  premix        1,000 mg 200 mL/hr over 60 Minutes Intravenous Every 8 hours 01/29/12 2045     01/27/12 1900   levofloxacin (LEVAQUIN) IVPB 500 mg        500 mg 100 mL/hr over 60 Minutes Intravenous Every 24 hours 01/27/12 1820     01/27/12 1900   vancomycin (VANCOCIN) 1,250 mg in sodium chloride 0.9 % 250 mL IVPB        1,250 mg 166.7 mL/hr over 90 Minutes Intravenous Every 12 hours 01/27/12 1836 01/29/12 2045   01/27/12 1200   ciprofloxacin (CIPRO) IVPB 400 mg  Status:  Discontinued        400 mg 200 mL/hr over 60 Minutes Intravenous 2 times daily 01/27/12 1042 01/27/12 1820   01/24/12 0809   bacitracin 50,000 Units in sodium chloride irrigation 0.9 % 500 mL irrigation  Status:  Discontinued          As needed 01/24/12 0809 01/24/12 1213   01/24/12 0730   gentamicin (GARAMYCIN) IVPB 80 mg  Status:  Discontinued        80 mg 100 mL/hr over 30 Minutes Intravenous To Surgery 01/24/12 0729 01/24/12 1342   01/24/12 0716   bacitracin 82956 UNITS injection     Comments: WALSH, AMY: cabinet override         01/24/12 0716 01/24/12 1929   01/24/12 0000   vancomycin (VANCOCIN) IVPB  1000 mg/200 mL premix        1,000 mg 200 mL/hr over 60 Minutes Intravenous 120 min pre-op 01/23/12 1445 01/24/12 0751          Assessment:  66 y.o. Female s/p lumbar fusion on 3/7  Blood cx neg to date  Urine culture --> e. Coli  D4 vanc/levaquin  Goal of Therapy:   Vancomycin trough level 15-20 mcg/ml  Plan:   Change Vancomycin to 750mg  IV q8  Consider dc vanc  [name] [name] [place], [place].D. 01/31/2012 12:21 PM

## 2012-01-31 NOTE — Progress Notes (Signed)
Filed Vitals:   01/30/12 1800 01/30/12 2200 01/31/12 0318 01/31/12 0931  BP: 127/86 170/64 129/78 134/74  Pulse: 65 76 84 87  Temp: 98.1 F (36.7 C) 98.8 F (37.1 C) 99.1 F (37.3 C) 98.5 F (36.9 C)  TempSrc: Oral  Oral   Resp: 20 18 20 20   Height:      Weight:      SpO2: 97% 94% 92% 99%   BMET  Basename 01/31/12 1040  NA 134*  K 3.3*  CL 100  CO2 27  GLUCOSE 146*  BUN 7  CREATININE 0.63  CALCIUM 8.5    Patient resting in bed, less discomfort than yesterday. Still describing discomfort to in the back and into the lower extremities. Dressing is clean and dry. Afebrile.  Plan: Met with patient, her husband, and her daughter, as well as nursing staff, and discussed the importance of progressing mobility and ambulation. I emphasized to the patient the importance of her working vigorously with physical therapy. They did raise the question of ultimate disposition, and are considering having her return to the skilled nursing facility that she went to after her lumbar fusion last May, and [place].  Hewitt Shorts, MD 01/31/2012, 1:48 PM

## 2012-01-31 NOTE — Progress Notes (Signed)
MD on call Dr Coletta Memos  Notified about pt falls  No new order given . Pt vs remain stable ,however pt confused with visual and auditory hallucination . C/o pain to back rated 3/10 on pain scale but declied pain medication at this time. No physical  injury noted. Will continue to monitor.

## 2012-01-31 NOTE — Progress Notes (Signed)
Pt transferred to camera room , room 3029 for close monitoring . Report given to receiving RN

## 2012-01-31 NOTE — Progress Notes (Signed)
Physical Therapy Treatment Patient Details Name: [name] [name] MRN: [id number] DOB: [birth year] Today's Date: 01/31/2012  PT Assessment/Plan  PT - Assessment/Plan Comments on Treatment Session: Making good improvement with bed mobility today! Pt also able to take a few steps however unable to attempt again for a second trial. Less wailing and shreaking today. Today pt had c/o bil. "shin pain." Unable to fully unstand nature of pain secondary to cognition.  PT Plan: Discharge plan needs to be updated;Frequency remains appropriate PT Frequency: Min 5X/week Follow Up Recommendations: Skilled nursing facility Equipment Recommended: Defer to next venue PT Goals  Acute Rehab PT Goals Pt will go Supine/Side to Sit: with modified independence PT Goal: Supine/Side to Sit - Progress: Progressing toward goal Pt will go Sit to Stand: with modified independence PT Goal: Sit to Stand - Progress: Not progressing Pt will go Stand to Sit: with modified independence PT Goal: Stand to Sit - Progress: Not progressing Pt will Transfer Bed to Chair/Chair to Bed: with supervision PT Transfer Goal: Bed to Chair/Chair to Bed - Progress: Progressing toward goal Pt will Ambulate: >150 feet;with supervision;with least restrictive assistive device PT Goal: Ambulate - Progress: Progressing toward goal  PT Treatment Precautions/Restrictions  Precautions Precautions: Back Precaution Booklet Issued: Yes (comment) Precaution Comments: Pt not recalling back precautions, verbally reviewed 3/3 precautions again. Pt with very poor complience throughout session. Required Braces or Orthoses: Yes Spinal Brace: Lumbar corset;Applied in sitting position Restrictions Weight Bearing Restrictions: No Mobility (including Balance) Bed Mobility Bed Mobility: Yes Rolling Left: 4: Min assist Rolling Left Details (indicate cue type and reason): Verbal/tactile cues for sequence. Only 2 attempts required today. Left Sidelying to  Sit: 2: Max assist;HOB flat Left Sidelying to Sit Details (indicate cue type and reason): 3 attempts required. Pt demonstrating improved contribution, tolerance to this activity.  Transfers Transfers: Yes Sit to Stand: 1: +2 Total assist;Patient percentage (comment);From bed;From chair/3-in-1 (pt = 60%) Sit to Stand Details (indicate cue type and reason): Verbal/tactile cues for sequence. Cues for safe UE placement. Less impulsiveness today. performed 6 times. Stand to Sit: With upper extremity assist;To chair/3-in-1;1: +2 Total assist (pt = 60%) Stand to Sit Details: With last sit pt with decreased safety awareness placing herself at increased fall risk.  Ambulation/Gait Ambulation/Gait: Yes Ambulation/Gait Assistance: 1: +2 Total assist;Patient percentage (comment) (pt= 60%) Ambulation/Gait Assistance Details (indicate cue type and reason): Assist for stability secondary to pt with balance loss and completely leaning forwards onto front of walker. Pt refused to stand up until PT stated "you will fall on your nose if you do not stand up," pt immediately stood upright.  Ambulation Distance (Feet): 3 Feet Assistive device: Rolling walker Gait Pattern: Step-to pattern;Shuffle (unable to fully assess given pt position)    Exercise  Other Exercises Other Exercises: Pt does not tolerate further exercise End of Session PT - End of Session Equipment Utilized During Treatment: Gait belt;Back brace Activity Tolerance: Patient limited by fatigue;Patient limited by pain;Treatment limited secondary to medical complications (Comment) (confusion) Patient left: with call bell in reach;in chair;with family/visitor present Nurse Communication: Mobility status for transfers;Need for lift equipment General Behavior During Session: Agitated Cognition: Impaired Cognitive Impairment: Pt with improved cognition today, decreased impulsiveness however continues to be greatly impaired. Pt does not adhere to back  precautions at all.   Sherrine Maples Cheek 01/31/2012, 4:43 PM  Sherie Don) Carleene Mains PT, DPT Acute Rehabilitation [phone]

## 2012-02-01 LAB — GLUCOSE, CAPILLARY
Glucose-Capillary: 104 mg/dL — ABNORMAL HIGH (ref 70–99)
Glucose-Capillary: 137 mg/dL — ABNORMAL HIGH (ref 70–99)
Glucose-Capillary: 124 mg/dL — ABNORMAL HIGH (ref 70–99)
Glucose-Capillary: 145 mg/dL — ABNORMAL HIGH (ref 70–99)

## 2012-02-01 NOTE — Progress Notes (Signed)
Rt Note:Pt setup on CPAP @ 9 cm H2O with 2lpm O2 via pt's own nasal mask for HS.

## 2012-02-01 NOTE — Progress Notes (Signed)
Pt. Doing much better than yesterday. Memory is getting better,now alert to self, place, and others.  Still having some periods of confusion. No apparent hallucinations today. Still having trouble with ambulation.  Only able to move from chair to bed.  Husband concerned about ambulation and feels patient needs cortisone shot.  MD made aware of this progress and concern.

## 2012-02-01 NOTE — Progress Notes (Signed)
Physical Therapy Treatment Patient Details Name: [name] [name] MRN: [id number] DOB: August 11, 1946 Today's Date: 02/01/2012  PT Assessment/Plan  PT - Assessment/Plan Comments on Treatment Session: Pt with decreased mobility today. Pt stated that she would attempt her hardest, but was only able to transfer from the bed to/from the 3-1 at the side of the bed. Pt complained of increased bilateral shin pain with any movement, attempted pressure and relieved some pain but was unable to relieve permanently. Difficult to assess pain secondary to cognitive impairments. Will continue to reasess PT Plan: Discharge plan remains appropriate;Frequency remains appropriate;Other (comment) (updated goals) PT Frequency: Min 5X/week Follow Up Recommendations: Skilled nursing facility Equipment Recommended: Defer to next venue PT Goals  Acute Rehab PT Goals PT Goal Formulation: With patient Time For Goal Achievement: 7 days Pt will go Supine/Side to Sit: with min assist PT Goal: Supine/Side to Sit - Progress: Revised due to lack of progress PT Goal: Sit to Supine/Side - Progress: Progressing toward goal Pt will go Sit to Stand: with min assist PT Goal: Sit to Stand - Progress: Revised due to lack of progress PT Goal: Stand to Sit - Progress: Progressing toward goal Pt will Transfer Bed to Chair/Chair to Bed: with min assist PT Transfer Goal: Bed to Chair/Chair to Bed - Progress: Revised due to lack of progress Pt will Ambulate: 51 - 150 feet;with min assist;with least restrictive assistive device PT Goal: Ambulate - Progress: Revised due to lack of progress  PT Treatment Precautions/Restrictions  Precautions Precautions: Back Precaution Booklet Issued: Yes (comment) Precaution Comments: Pt not recalling back precautions, verbally reviewed 3/3 precautions again. Pt with very poor complience throughout session. Required Braces or Orthoses: Yes Spinal Brace: Lumbar corset;Applied in sitting  position Restrictions Weight Bearing Restrictions: No Mobility (including Balance) Bed Mobility Bed Mobility: Yes Rolling Left: 4: Min assist Rolling Left Details (indicate cue type and reason): VC for sequencing as well as tactile cues through her trunk to maintain back precautions Left Sidelying to Sit: 1: +2 Total assist;HOB elevated (comment degrees);With rails;Patient percentage (comment) (40) Left Sidelying to Sit Details (indicate cue type and reason): Pt required assist for initiation of movement and to get into full upright sitting as pt was resistant with last 25% of sit. Pt with complaints of L shin pain once sitting; RN aware Sitting - Scoot to Edge of Bed: 2: Max assist Sitting - Scoot to [place] of Bed Details (indicate cue type and reason): Drawsheet used to asisst pt towards EOB Sit to Supine: 3: Mod assist Sit to Supine - Details (indicate cue type and reason): Mod assist with trunk control and with LEs into supine. VC to maintain back precautions Transfers Transfers: Yes Sit to Stand: 1: +2 Total assist;Patient percentage (comment);From bed;From chair/3-in-1 (Pt 40%-60%) Sit to Stand Details (indicate cue type and reason): Pt inconsistent with the amount of assistance given during stand. VC throughout for sequencing. Performed multiple times from bed and commode Stand to Sit: With upper extremity assist;To chair/3-in-1;1: +2 Total assist;To bed (Pt 60%) Stand to Sit Details: Pt able to assist with descent although unable to control. VC for sequencing and hand placement for safety Stand Pivot Transfers: 1: +2 Total assist;Patient percentage (comment) (Pt 40%) Stand Pivot Transfer Details (indicate cue type and reason): VC throughout for sequencing as well as safety with back precautions. Pt able to initiate movement but required assist for control, stability, as well as guidance Ambulation/Gait Ambulation/Gait: No    Exercise    End of Session PT -  End of Session Equipment  Utilized During Treatment: Gait belt;Back brace Activity Tolerance: Patient limited by fatigue;Patient limited by pain Patient left: in bed;with call bell in reach Nurse Communication: Mobility status for transfers;Need for lift equipment General Behavior During Session: [facility] for tasks performed (labile) Cognition: Impaired Cognitive Impairment: Unable to determine if pt has improved cognitively, she is able to recall more information although she is still impulsive and unable to follow back precautions. Pt stated to me that she is in her right mind but she is just in a lot of pain  Milana Kidney 02/01/2012, 4:48 PM  02/01/2012 Milana Kidney DPT PAGER: [phone] OFFICE: [id number]

## 2012-02-01 NOTE — Progress Notes (Signed)
Filed Vitals:   01/31/12 1740 01/31/12 2305 02/01/12 0000 02/01/12 0500  BP: 135/78 164/73  136/78  Pulse: 84 87 82 82  Temp: 98.8 F (37.1 C) 98.1 F (36.7 C)  98.9 F (37.2 C)  TempSrc: Axillary Oral  Oral  Resp: 20 16 18 18   Height:      Weight:      SpO2: 95% 100%  100%   BMET  Basename 01/31/12 1040  NA 134*  K 3.3*  CL 100  CO2 27  GLUCOSE 146*  BUN 7  CREATININE 0.63  CALCIUM 8.5   Patient resting in bed comfortably. With a staff we had her turned to her side and changed her dressing. The wound is healing well. No erythema or drainage at this time. There is some mild swelling at the bottom of the incision.  Unfortunately limited progress with physical therapy yesterday, she did get up and out of bed to the chair but did not ambulate to any significant degree. She was once again strongly encouraged to begin ambulation in the room and then into the hallway.  I discussed with Maralyn Sago the Child psychotherapist eventual transfer to [facility] and [facility] for skilled nursing care following discharge, however she certainly needs to make significant progress between now and transfer.  Plan: Continue PT and OT, encouraged significant progress.  Hewitt Shorts, MD 02/01/2012, 8:37 AM I. I and

## 2012-02-02 LAB — GLUCOSE, CAPILLARY
Glucose-Capillary: 105 mg/dL — ABNORMAL HIGH (ref 70–99)
Glucose-Capillary: 98 mg/dL (ref 70–99)
Glucose-Capillary: 116 mg/dL — ABNORMAL HIGH (ref 70–99)
Glucose-Capillary: 114 mg/dL — ABNORMAL HIGH (ref 70–99)

## 2012-02-02 LAB — CULTURE, BLOOD (ROUTINE X 2): Culture: NO GROWTH

## 2012-02-02 MED ORDER — LEVOFLOXACIN 500 MG PO TABS
500.0000 mg | ORAL_TABLET | Freq: Every day | ORAL | Status: DC
  Administered 2012-02-02: 500 mg via ORAL
  Filled 2012-02-02 (×2): qty 1

## 2012-02-02 NOTE — Progress Notes (Signed)
Filed Vitals:   02/01/12 2200 02/02/12 0200 02/02/12 0513 02/02/12 1000  BP: 146/57 124/56 125/72 128/51  Pulse: 78 72 66 79  Temp: 98.1 F (36.7 C) 98.2 F (36.8 C) 97.9 F (36.6 C) 98.3 F (36.8 C)  TempSrc: Oral Oral Oral Oral  Resp: 20 20 19 18   Height:      Weight:      SpO2: 98% 98% 99% 97%     Patient continued to make very little progress in terms of mobility and ambulation. Continue to work with PT and OT. Having some discomfort in the back as well as into the lower extremities, but emphasized to her that increasing mobility and activity will hopefully gradually reduce her discomfort. Her dressing is clean and dry.  Plan: Continue PT and OT.  Hewitt Shorts, MD 02/02/2012, 10:42 AM

## 2012-02-02 NOTE — Progress Notes (Signed)
Occupational Therapy Treatment Patient Details Name: [name] [name] MRN: [id number] DOB: October 21, 1946 Today's Date: 02/02/2012 11:14-11:52  3sc   OT Assessment/Plan OT Assessment/Plan Comments on Treatment Session: Pt stilll severely limited with all mobility and standing/sitting tolerance secondary to LE pain.  Pain is present in her hamstrings and in her feet when she is standing.  Only able to currently tolerate standing for 5-6 second and becomes very tearful.  based on current progress feel pt will need SNF for follow up therapy. OT Plan: Discharge plan remains appropriate OT Frequency: Min 2X/week Follow Up Recommendations: Skilled nursing facility Equipment Recommended: Defer to next venue OT Goals ADL Goals ADL Goal: Toilet Transfer - Progress: Not progressing  OT Treatment Precautions/Restrictions  Precautions Precautions: Back Required Braces or Orthoses: Yes Spinal Brace: Lumbar corset;Applied in sitting position Restrictions Weight Bearing Restrictions: No   ADL ADL Upper Body Dressing: Performed;Maximal assistance Upper Body Dressing Details (indicate cue type and reason): For donning lumbar corsett Where Assessed - Upper Body Dressing: Unsupported;Sitting, bed Toilet Transfer: Simulated;Maximal assistance Toilet Transfer Method: Stand pivot Toilet Transfer Equipment: Bedside commode Toileting - Clothing Manipulation: Simulated;Maximal assistance Where Assessed - Toileting Clothing Manipulation: Sit to stand from 3-in-1 or toilet Toileting - Hygiene: Simulated;Maximal assistance Where Assessed - Toileting Hygiene: Sit to stand from 3-in-1 or toilet Equipment Used: Rolling walker Ambulation Related to ADLs: Pt only able to tolerate stand pivot transfer secondary to pain in bilateral legs and feet.   ADL Comments: Limited participation secondary to pain and anxiety.  Pt stating she wants to walk but once standing cannot tolerate and immediately needs to sit down.  Transferred to bedside chair but pt unable to tolerate so transfered back to bed.  Encouraged pt to try to sit up as much as possible and maybe try again with nursing later today.  Upon standing and with sitting pt yelling and crying out secondary to leg pain and foot pain. Mobility  Bed Mobility Bed Mobility: Yes Rolling Left: 4: Min assist;With rail Left Sidelying to Sit: 3: Mod assist;HOB flat;With rails Transfers Transfers: Yes Sit to Stand: 2: Max assist;From chair/3-in-1;From bed;With upper extremity assist;With armrests Stand to Sit: 2: Max assist;With armrests;With upper extremity assist Stand to Sit Details: Pt with decreased ability to control descent.  End of Session OT - End of Session Equipment Utilized During Treatment: Gait belt;Other (comment) (rolling walker) Activity Tolerance: Patient limited by pain Patient left: in bed;with call bell in reach General Behavior During Session: [facility] for tasks performed Cognition: Impaired Cognitive Impairment: Pt able to state 2/3 back precautions but needs max cues to sequence bed mobility.    Jalana Moore OTR/L 02/02/2012, 1:16 PM Pager number [phone]

## 2012-02-03 LAB — GLUCOSE, CAPILLARY
Glucose-Capillary: 141 mg/dL — ABNORMAL HIGH (ref 70–99)
Glucose-Capillary: 112 mg/dL — ABNORMAL HIGH (ref 70–99)
Glucose-Capillary: 147 mg/dL — ABNORMAL HIGH (ref 70–99)

## 2012-02-03 NOTE — Progress Notes (Signed)
Filed Vitals:   02/02/12 2320 02/03/12 0216 02/03/12 0725 02/03/12 0900  BP: 147/81 143/76 131/73 151/66  Pulse: 102 66 61 72  Temp: 98.4 F (36.9 C) 98.2 F (36.8 C) 98.3 F (36.8 C) 97.5 F (36.4 C)  TempSrc: Oral Oral Oral Oral  Resp: 20 20 20 17   Height:      Weight:      SpO2: 91% 97% 95% 96%    Patient without new complaints. Has actually made some progress, taking more steps within the room. Continuing PT and OT. I've emphasized to her the importance of progress in terms of mobility and ambulation.  Plan: Eventual transfer to skilled nursing facility for rehabilitation. We'll DC antibiotics today.  Hewitt Shorts, MD 02/03/2012, 12:31 PM

## 2012-02-04 LAB — GLUCOSE, CAPILLARY
Glucose-Capillary: 120 mg/dL — ABNORMAL HIGH (ref 70–99)
Glucose-Capillary: 121 mg/dL — ABNORMAL HIGH (ref 70–99)
Glucose-Capillary: 110 mg/dL — ABNORMAL HIGH (ref 70–99)
Glucose-Capillary: 129 mg/dL — ABNORMAL HIGH (ref 70–99)

## 2012-02-04 MED ORDER — OXYCODONE-ACETAMINOPHEN 5-325 MG PO TABS: 1.0000 | ORAL_TABLET | ORAL | Status: AC | PRN

## 2012-02-04 MED FILL — Insulin Aspart Inj 100 Unit/ML: SUBCUTANEOUS | Qty: 0.02 | Status: AC

## 2012-02-04 NOTE — Discharge Summary (Signed)
Physician Discharge Summary  Patient ID: [name] [name] MRN: [id number] DOB/AGE: 66/04/1946 66 y.o.  Admit date: 01/24/2012 Discharge date: 02/04/2012  Admission Diagnoses: Lumbar spondylolisthesis, lumbar stenosis, lumbar spondylosis, lumbar degenerative disc disease  Discharge Diagnoses:  Lumbar spondylolisthesis, lumbar stenosis, lumbar spondylosis, lumbar degenerative disc disease  Discharged Condition: good  Hospital Course: Patient was admitted for progressively worsening grade 3 spondylolisthesis of L5 and S1, she is status post a L4-5 lumbar decompression and arthrodesis in May of 2012. She underwent a bilateral L5-S1 lumbar decompression and fusion. Postoperatively her progress has been slow. She's been seen in PT and OT consultation and if continued to work with her throughout the postoperative period. Initially she had significant pain but that has gradually diminished and it has diminished she has been able to be more mobile, although she continues to require significant assistance in transfers and mobility. The wound has been healing well there is no swelling erythema or drainage. X-rays were done of the lumbar spine, and the fusion construct looks good, the interbody implants are in good position as are the pedicle screws.  Social work consultation was requested and arrangements have been made for transfer to a skilled nursing facility for further postoperative rehabilitation. The patient specifically requested [facility] where she was treated after her last surgery and did well. She is to continue PT and OT at the skilled nursing facility. She should be able to shower with assistance, the wound does not need to be covered. At this point she is using Percocet for pain, and she is to continue on all of her usual home medications. She is to return for followup with me in about a month, my staff will arrange for x-rays begun on the day of the appointment at my office. She is  to don and doff her lumbar brace once she is up and out of bed, it is not to be donned and doffed in bed.  Discharge Exam: Blood pressure 129/69, pulse 64, temperature 96.7 F (35.9 C), temperature source Oral, resp. rate 18, height 5\' 3"  (1.6 m), weight 94.348 kg (208 lb), SpO2 97.00%.  Disposition: Skilled nursing facility  Discharge Orders    Future Appointments: Provider: Department: Dept Phone: Center:   12/25/2012 1:30 PM Waymon Budge, MD [facility] [phone] None     Medication List  As of 02/04/2012  7:15 PM   TAKE these medications         aspirin EC 81 MG tablet   Take 81 mg by mouth daily.      buPROPion 300 MG 24 hr tablet   Commonly known as: WELLBUTRIN XL   Take 300 mg by mouth daily.      BYDUREON 2 MG Susr   Generic drug: Exenatide   Inject 2 mg into the skin once a week. On Monday      diclofenac-misoprostol 75-200 MG-MCG per tablet   Commonly known as: ARTHROTEC 75   Take 1 tablet by mouth 2 (two) times daily.      diltiazem 180 MG 24 hr capsule   Commonly known as: DILACOR XR   Take 180 mg by mouth at bedtime.      ergocalciferol 50000 UNITS capsule   Commonly known as: VITAMIN D2   Take 50,000 Units by mouth once a week. Monday      ezetimibe 10 MG tablet   Commonly known as: ZETIA   Take 5 mg by mouth at bedtime.      fluticasone 50  MCG/ACT nasal spray   Commonly known as: FLONASE   Place 2 sprays into the nose daily as needed. For allergies      furosemide 20 MG tablet   Commonly known as: LASIX   Take 20 mg by mouth daily.      gabapentin 100 MG capsule   Commonly known as: NEURONTIN   Take 200-300 mg by mouth 3 (three) times daily. Take 200mg  in the morning and afternoon, take 300mg  at bedtime.      isosorbide mononitrate 60 MG 24 hr tablet   Commonly known as: IMDUR   Take 60 mg by mouth daily.      lansoprazole 30 MG capsule   Commonly known as: PREVACID   Take 30 mg by mouth 2 (two) times daily before a meal.       metoprolol 50 MG tablet   Commonly known as: LOPRESSOR   Take 50 mg by mouth 2 (two) times daily.      oxyCODONE-acetaminophen 5-325 MG per tablet   Commonly known as: PERCOCET   Take 1-2 tablets by mouth every 4 (four) hours as needed for pain.      potassium chloride SA 20 MEQ tablet   Commonly known as: K-DUR,KLOR-CON   Take 20 mEq by mouth 2 (two) times daily.      rosuvastatin 10 MG tablet   Commonly known as: CRESTOR   Take 5 mg by mouth daily.      traMADol 50 MG tablet   Commonly known as: ULTRAM   Take 50 mg by mouth every 6 (six) hours as needed. For pain      valsartan-hydrochlorothiazide 160-12.5 MG per tablet   Commonly known as: DIOVAN-HCT   Take 2 tablets by mouth daily.      venlafaxine 50 MG tablet   Commonly known as: EFFEXOR   Take 50 mg by mouth daily.             Signed: Hewitt Shorts, MD 02/04/2012, 7:15 PM

## 2012-02-04 NOTE — Progress Notes (Signed)
Filed Vitals:   02/03/12 2200 02/04/12 0200 02/04/12 0600 02/04/12 1000  BP: 159/74 134/79 149/70 142/73  Pulse: 80 72 67 78  Temp: 98 F (36.7 C) 98.1 F (36.7 C) 98 F (36.7 C) 97.9 F (36.6 C)  TempSrc:    Oral  Resp: 18 18 18 18   Height:      Weight:      SpO2: 96% 97% 94% 97%    Patient continuing to work with PT and OT. Social work [employer]ar[employer] manager arrangements for transfer to skilled nursing facility this week. Dressing removed, wound healing well, no erythema swelling or drainage.  Plan: Continuing PT and OT, encourage patient to work hard with therapy.  Hewitt Shorts, MD 02/04/2012, 1:28 PM

## 2012-02-04 NOTE — Progress Notes (Signed)
Occupational Therapy Treatment Patient Details Name: [name] [name] MRN: [id number] DOB: [birth year] Today's Date: 02/04/2012  OT Assessment/Plan OT Assessment/Plan Comments on Treatment Session: Pt progressing well in mobility, but needs a lot of encouragement to participate due to pain and anxiety. OT Plan: Discharge plan remains appropriate OT Frequency: Min 2X/week Follow Up Recommendations: Skilled nursing facility Equipment Recommended: Defer to next venue OT Goals Acute Rehab OT Goals OT Goal Formulation: With patient Time For Goal Achievement: 7 days ADL Goals Pt Will Perform Grooming: with modified independence;Standing at sink ADL Goal: Grooming - Progress: Progressing toward goals Pt Will Transfer to Toilet: with modified independence;with DME;3-in-1;Ambulation;Maintaining back safety precautions ADL Goal: Toilet Transfer - Progress: Progressing toward goals  OT Treatment Precautions/Restrictions  Precautions Precautions: Back;Fall Precaution Comments: Reviewed back precautions with bed mobility and transfers Required Braces or Orthoses: Yes Spinal Brace: Lumbar corset;Applied in sitting position   ADL ADL Grooming: Performed;Teeth care;Brushing hair;Wash/dry face;Set up Where Assessed - Grooming: Sitting, chair (unable to tolerate standing at sink) Toilet Transfer: Simulated;Minimal assistance;Other (comment) (bed to chair, RN also there for safety) Toilet Transfer Details (indicate cue type and reason): cues to slow pace, stand tall, technique to control descent Toilet Transfer Method: Ambulating;Other (comment) (took 3 steps) Equipment Used: Rolling walker Ambulation Related to ADLs: Moved chair 3 feet away to encourage ambulation avoiding stand-pivot. ADL Comments: Pt limited by pain, tearful.  Moves well, however.  Needs encouragement. Mobility  Bed Mobility Bed Mobility: Yes Rolling Left: 5: Supervision;With rail Left Sidelying to Sit: 5: Supervision;With  rails Sitting - Scoot to Edge of Bed: 5: Supervision;With rail Sit to Supine: 5: Supervision;Other (comment) (cues for back precaution, attempts to throw herself back) Transfers Transfers: Yes Sit to Stand: 1: +2 Total assist;From bed;From chair/3-in-1;Patient percentage (comment) (75) Stand to Sit: 1: +2 Total assist;To chair/3-in-1;Patient percentage (comment);With armrests (75) End of Session OT - End of Session Equipment Utilized During Treatment: Back brace;Gait belt Activity Tolerance: Patient limited by pain Patient left: in chair;with call bell in reach;with family/visitor present Nurse Communication: Mobility status for transfers General Behavior During Session:  (anxious, tearful) Cognition: WFL for tasks performed  Evern Bio  02/04/2012, 11:11 AM [phone]

## 2012-02-04 NOTE — Progress Notes (Signed)
CSW met with pt and husband to address discharge plan. [facility] is able to make a bed offer for pt tomorrow (02/05/12). Pt and husband are agreeable to this. Pt appears to be more oriented and less confused. MD is aware of discharge plan. CSW will facilitate discharge tomorrow to SNF.   Dede Query, MSW, Theresia Majors [phone]

## 2012-02-04 NOTE — Progress Notes (Signed)
Utilization review completed. Breely Panik, RN, BSN. 02/04/12 

## 2012-02-04 NOTE — Progress Notes (Signed)
Physical Therapy Treatment Patient Details Name: [name] [name] MRN: [id number] DOB: [birth year] Today's Date: 02/04/2012  PT Assessment/Plan  PT - Assessment/Plan Comments on Treatment Session: Pt progressed mobility today well. Supervision level for bed mobility, able to ambulate 30 feet total (15', seated rest, then 15'). Pt very happy about her progress but not so happy about having to sit in chair at end of session. Encouraged to stay in chair 28min-1hour. PT Plan: Discharge plan remains appropriate;Frequency remains appropriate;Other (comment) (updated goals) PT Frequency: Min 5X/week Follow Up Recommendations: Skilled nursing facility Equipment Recommended: Defer to next venue PT Goals  Acute Rehab PT Goals PT Goal Formulation: With patient Time For Goal Achievement: 7 days Pt will go Supine/Side to Sit: with min assist PT Goal: Supine/Side to Sit - Progress: Met Pt will go Sit to Stand: with min assist PT Goal: Sit to Stand - Progress: Progressing toward goal Pt will go Stand to Sit: with modified independence PT Goal: Stand to Sit - Progress: Progressing toward goal Pt will Transfer Bed to Chair/Chair to Bed: with min assist PT Transfer Goal: Bed to Chair/Chair to Bed - Progress: Progressing toward goal Pt will Ambulate: 51 - 150 feet;with min assist;with least restrictive assistive device PT Goal: Ambulate - Progress: Progressing toward goal  PT Treatment Precautions/Restrictions  Precautions Precautions: Back;Fall Precaution Booklet Issued: Yes (comment) Precaution Comments: Pt recalling 2/3 back precautions. Verbally reviewed all three. Pt with improved adherence during session.  Required Braces or Orthoses: Yes Spinal Brace: Lumbar corset;Applied in sitting position Restrictions Weight Bearing Restrictions: No Mobility (including Balance) Bed Mobility Bed Mobility: Yes Rolling Left: 5: Supervision Rolling Left Details (indicate cue type and reason): Verbal cues  for initiation, log roll technique. Left Sidelying to Sit: 5: Supervision;HOB flat Left Sidelying to Sit Details (indicate cue type and reason): Verbal cues to initiate, no physical assist required.  Transfers Transfers: Yes Sit to Stand: From bed;From chair/3-in-1;2: Max assist Sit to Stand Details (indicate cue type and reason): Much improved contribution. Cues for mechanics for ease of transition. Verbal cues for UE placement. Much less impulsive today. Stand to Sit: With upper extremity assist;To chair/3-in-1;1: +2 Total assist;To bed (Pt 70%) Stand to Sit Details: Pt with decreased ability to control descent Stand Pivot Transfers: 3: Mod assist Stand Pivot Transfer Details (indicate cue type and reason): VC throughout for sequencing as well as safety with back precautions. Max verbal cues to keep trunk upright and maintain back precautions.  Ambulation/Gait Ambulation/Gait: Yes Ambulation/Gait Assistance: 3: Mod assist Ambulation/Gait Assistance Details (indicate cue type and reason): Up to moderate assist when pt decided she wanted to sit down. +1 person with chair to follow. Cues for upright posture. Pt with goog speed up until she decides she wants to stop.  Ambulation Distance (Feet): 30 Feet (15', seated rest then 15') Assistive device: Rolling walker Gait Pattern: Shuffle;Trunk flexed;Decreased stride length (minimal foot clearance)    Exercise    End of Session PT - End of Session Equipment Utilized During Treatment: Gait belt;Back brace Activity Tolerance: Patient limited by fatigue;Patient limited by pain Patient left: with call bell in reach;in chair Nurse Communication: Mobility status for transfers;Need for lift equipment General Behavior During Session: [facility] for tasks performed (labile) Cognition: Impaired Cognitive Impairment: Pt with improved cognition, decreased impulsiveness. Pt able to soothe self after getting worked up however remains very anxious about  everything from decreased ability to urinate to fear of falling.  Sherrine Maples Cheek 02/04/2012, 5:04 PM  Dahlia Client (Cheek) Carleene Mains  PT, DPT Acute Rehabilitation [phone]

## 2012-02-05 DIAGNOSIS — M6250 Muscle wasting and atrophy, not elsewhere classified, unspecified site: Secondary | ICD-10-CM | POA: Diagnosis not present

## 2012-02-05 DIAGNOSIS — M545 Low back pain, unspecified: Secondary | ICD-10-CM | POA: Diagnosis not present

## 2012-02-05 DIAGNOSIS — IMO0002 Reserved for concepts with insufficient information to code with codable children: Secondary | ICD-10-CM | POA: Diagnosis not present

## 2012-02-05 DIAGNOSIS — R161 Splenomegaly, not elsewhere classified: Secondary | ICD-10-CM | POA: Diagnosis not present

## 2012-02-05 DIAGNOSIS — D638 Anemia in other chronic diseases classified elsewhere: Secondary | ICD-10-CM | POA: Diagnosis not present

## 2012-02-05 DIAGNOSIS — Z4789 Encounter for other orthopedic aftercare: Secondary | ICD-10-CM | POA: Diagnosis not present

## 2012-02-05 DIAGNOSIS — Q762 Congenital spondylolisthesis: Secondary | ICD-10-CM | POA: Diagnosis not present

## 2012-02-05 DIAGNOSIS — R262 Difficulty in walking, not elsewhere classified: Secondary | ICD-10-CM | POA: Diagnosis not present

## 2012-02-05 DIAGNOSIS — M48061 Spinal stenosis, lumbar region without neurogenic claudication: Secondary | ICD-10-CM | POA: Diagnosis not present

## 2012-02-05 DIAGNOSIS — M79609 Pain in unspecified limb: Secondary | ICD-10-CM | POA: Diagnosis not present

## 2012-02-05 DIAGNOSIS — K7460 Unspecified cirrhosis of liver: Secondary | ICD-10-CM | POA: Diagnosis not present

## 2012-02-05 DIAGNOSIS — Z79899 Other long term (current) drug therapy: Secondary | ICD-10-CM | POA: Diagnosis not present

## 2012-02-05 DIAGNOSIS — M1990 Unspecified osteoarthritis, unspecified site: Secondary | ICD-10-CM | POA: Diagnosis not present

## 2012-02-05 DIAGNOSIS — E119 Type 2 diabetes mellitus without complications: Secondary | ICD-10-CM | POA: Diagnosis not present

## 2012-02-05 DIAGNOSIS — Z5181 Encounter for therapeutic drug level monitoring: Secondary | ICD-10-CM | POA: Diagnosis not present

## 2012-02-05 DIAGNOSIS — M549 Dorsalgia, unspecified: Secondary | ICD-10-CM | POA: Diagnosis not present

## 2012-02-05 DIAGNOSIS — M6281 Muscle weakness (generalized): Secondary | ICD-10-CM | POA: Diagnosis not present

## 2012-02-05 DIAGNOSIS — Z5189 Encounter for other specified aftercare: Secondary | ICD-10-CM | POA: Diagnosis not present

## 2012-02-05 DIAGNOSIS — R269 Unspecified abnormalities of gait and mobility: Secondary | ICD-10-CM | POA: Diagnosis not present

## 2012-02-05 DIAGNOSIS — I10 Essential (primary) hypertension: Secondary | ICD-10-CM | POA: Diagnosis not present

## 2012-02-05 DIAGNOSIS — B351 Tinea unguium: Secondary | ICD-10-CM | POA: Diagnosis not present

## 2012-02-05 LAB — GLUCOSE, CAPILLARY
Glucose-Capillary: 117 mg/dL — ABNORMAL HIGH (ref 70–99)
Glucose-Capillary: 145 mg/dL — ABNORMAL HIGH (ref 70–99)

## 2012-02-05 NOTE — Progress Notes (Signed)
Pt is ready for discharge today to [facility]. Pt and husband are agreeable to discharge plan. Facility has received discharge summary and is ready to admit pt. [facility] will provide transportation to facility. CSW is signing off as no further clinical social work needs identified.   Dede Query, MSW, Theresia Majors [phone]

## 2012-02-05 NOTE — Progress Notes (Signed)
Physical Therapy Treatment Patient Details Name: [name] [name] MRN: [id number] DOB: Mar 21, 1946 Today's Date: 02/05/2012  PT Assessment/Plan  PT - Assessment/Plan Comments on Treatment Session: Pt slightly more impulsive today and refusing to sit in chair after session however was able to ambulate slightly more than yesterday with max encouragement. Pt continues to have difficulty with compliance of back precautions. Pain continues to be pt's limiting factor.  PT Plan: Discharge plan remains appropriate;Frequency remains appropriate;Other (comment) PT Frequency: Min 5X/week Follow Up Recommendations: Skilled nursing facility Equipment Recommended: Defer to next venue PT Goals  Acute Rehab PT Goals Pt will go Supine/Side to Sit: with modified independence PT Goal: Supine/Side to Sit - Progress: Goal set today Pt will go Sit to Supine/Side: with modified independence PT Goal: Sit to Supine/Side - Progress: Progressing toward goal Pt will go Sit to Stand: with min assist PT Goal: Sit to Stand - Progress: Progressing toward goal Pt will go Stand to Sit: with modified independence Pt will Transfer Bed to Chair/Chair to Bed: with min assist Pt will Ambulate: 51 - 150 feet;with min assist;with least restrictive assistive device PT Goal: Ambulate - Progress: Progressing toward goal  PT Treatment Precautions/Restrictions  Precautions Precautions: Back;Fall Precaution Booklet Issued: Yes (comment) Precaution Comments: Pt recalling 2/3 back precautions.  Pt with improved adherence during session however still far from full compliance Required Braces or Orthoses: Yes Spinal Brace: Lumbar corset;Applied in sitting position Restrictions Weight Bearing Restrictions: No Mobility (including Balance) Bed Mobility Bed Mobility: Yes Rolling Left: 5: Supervision Rolling Left Details (indicate cue type and reason): Verbal cues for initiation, log roll technique Left Sidelying to Sit: 5:  Supervision;HOB flat Left Sidelying to Sit Details (indicate cue type and reason): Verbal cues to initiate, no physical assist required again however pt tearful today.  Sit to Supine: 5: Supervision Sit to Supine - Details (indicate cue type and reason): Cues for safety, back precautions. Pt with decreased adherence secondary to impulsiveness. Sit to Sidelying Left: 5: Supervision Sit to Sidelying Left Details (indicate cue type and reason): Cues for safety, back precautions. Pt with decreased adherence secondary to impulsiveness. Transfers Transfers: Yes Sit to Stand: From bed;From chair/3-in-1;3: Mod assist Sit to Stand Details (indicate cue type and reason): Performed 3x. Verbal cues for initiation. Pt impulsive from 3n1 with decreased safety awareness.  Stand to Sit: With upper extremity assist;To chair/3-in-1;To bed;2: Max assist (Pt 70%) Stand to Sit Details: Assist to appropriately place secondary to impulsiveness, cues for UE placement and for safety Ambulation/Gait Ambulation/Gait: Yes Ambulation/Gait Assistance: 3: Mod assist Ambulation/Gait Assistance Details (indicate cue type and reason): Up to moderate assist when pt decided she wanted to sit down. +1 person with chair to follow. Cues for upright posture. Pt very impulsive and unsafe gait back to bed. Ambulation Distance (Feet): 32 Feet (16' seated rest 16') Assistive device: Rolling walker Gait Pattern: Shuffle;Trunk flexed;Decreased stride length (minimal foot clearance)    End of Session PT - End of Session Equipment Utilized During Treatment: Gait belt;Back brace Activity Tolerance: Patient limited by fatigue;Patient limited by pain;Other (comment) (impulsiveness) Patient left: with call bell in reach;in bed Nurse Communication: Mobility status for transfers;Mobility status for ambulation General Behavior During Session: Other (comment) (impulsive, labile) Cognition: Impaired Cognitive Impairment: Pt with improved  cognition, impulsiveness noted again. Pt able to soothe self after getting worked up however remains very anxious.   Sherrine Maples Cheek 02/05/2012, 1:10 PM  Sherie Don) Carleene Mains PT, DPT Acute Rehabilitation [phone]

## 2012-02-05 NOTE — Progress Notes (Signed)
Filed Vitals:   02/04/12 1800 02/04/12 2200 02/05/12 0200 02/05/12 0600  BP: 129/69 126/70 151/82 146/97  Pulse: 64 68 61 64  Temp: 96.7 F (35.9 C) 98.5 F (36.9 C) 97.4 F (36.3 C) 98.7 F (37.1 C)  TempSrc: Oral     Resp: 18 18 18 18   Height:      Weight:      SpO2: 97% 97% 99% 93%    Patient resting in bed. Transferred today to skilled nursing facility. All arrangements have been made. Copy of history and physical and prescription for Percocet left with nurse. Copy of discharge summary also be sent with patient.   Plan: Transfer to skilled nursing facility today.  Hewitt Shorts, MD 02/05/2012, 8:03 AM

## 2012-02-06 LAB — GLUCOSE, CAPILLARY: Glucose-Capillary: 116 mg/dL — ABNORMAL HIGH (ref 70–99)

## 2012-02-07 DIAGNOSIS — Q762 Congenital spondylolisthesis: Secondary | ICD-10-CM | POA: Diagnosis not present

## 2012-02-07 DIAGNOSIS — R269 Unspecified abnormalities of gait and mobility: Secondary | ICD-10-CM | POA: Diagnosis not present

## 2012-02-07 DIAGNOSIS — I10 Essential (primary) hypertension: Secondary | ICD-10-CM | POA: Diagnosis not present

## 2012-02-22 DIAGNOSIS — M545 Low back pain, unspecified: Secondary | ICD-10-CM | POA: Diagnosis not present

## 2012-03-12 DIAGNOSIS — K7460 Unspecified cirrhosis of liver: Secondary | ICD-10-CM | POA: Diagnosis not present

## 2012-03-12 DIAGNOSIS — R161 Splenomegaly, not elsewhere classified: Secondary | ICD-10-CM | POA: Diagnosis not present

## 2012-04-21 DIAGNOSIS — E119 Type 2 diabetes mellitus without complications: Secondary | ICD-10-CM | POA: Diagnosis not present

## 2012-04-21 DIAGNOSIS — M48061 Spinal stenosis, lumbar region without neurogenic claudication: Secondary | ICD-10-CM | POA: Diagnosis not present

## 2012-04-21 DIAGNOSIS — R269 Unspecified abnormalities of gait and mobility: Secondary | ICD-10-CM | POA: Diagnosis not present

## 2012-04-21 DIAGNOSIS — I10 Essential (primary) hypertension: Secondary | ICD-10-CM | POA: Diagnosis not present

## 2012-04-22 DIAGNOSIS — M545 Low back pain, unspecified: Secondary | ICD-10-CM | POA: Diagnosis not present

## 2012-04-23 DIAGNOSIS — M48061 Spinal stenosis, lumbar region without neurogenic claudication: Secondary | ICD-10-CM | POA: Diagnosis not present

## 2012-04-23 DIAGNOSIS — I10 Essential (primary) hypertension: Secondary | ICD-10-CM | POA: Diagnosis not present

## 2012-04-23 DIAGNOSIS — R269 Unspecified abnormalities of gait and mobility: Secondary | ICD-10-CM | POA: Diagnosis not present

## 2012-04-23 DIAGNOSIS — E119 Type 2 diabetes mellitus without complications: Secondary | ICD-10-CM | POA: Diagnosis not present

## 2012-04-23 DIAGNOSIS — I509 Heart failure, unspecified: Secondary | ICD-10-CM | POA: Diagnosis not present

## 2012-04-25 DIAGNOSIS — E119 Type 2 diabetes mellitus without complications: Secondary | ICD-10-CM | POA: Diagnosis not present

## 2012-04-25 DIAGNOSIS — M48061 Spinal stenosis, lumbar region without neurogenic claudication: Secondary | ICD-10-CM | POA: Diagnosis not present

## 2012-04-25 DIAGNOSIS — I509 Heart failure, unspecified: Secondary | ICD-10-CM | POA: Diagnosis not present

## 2012-04-25 DIAGNOSIS — I10 Essential (primary) hypertension: Secondary | ICD-10-CM | POA: Diagnosis not present

## 2012-04-25 DIAGNOSIS — R269 Unspecified abnormalities of gait and mobility: Secondary | ICD-10-CM | POA: Diagnosis not present

## 2012-04-28 DIAGNOSIS — I10 Essential (primary) hypertension: Secondary | ICD-10-CM | POA: Diagnosis not present

## 2012-04-28 DIAGNOSIS — M48061 Spinal stenosis, lumbar region without neurogenic claudication: Secondary | ICD-10-CM | POA: Diagnosis not present

## 2012-04-28 DIAGNOSIS — E119 Type 2 diabetes mellitus without complications: Secondary | ICD-10-CM | POA: Diagnosis not present

## 2012-04-28 DIAGNOSIS — I509 Heart failure, unspecified: Secondary | ICD-10-CM | POA: Diagnosis not present

## 2012-04-28 DIAGNOSIS — R269 Unspecified abnormalities of gait and mobility: Secondary | ICD-10-CM | POA: Diagnosis not present

## 2012-04-30 DIAGNOSIS — M48061 Spinal stenosis, lumbar region without neurogenic claudication: Secondary | ICD-10-CM | POA: Diagnosis not present

## 2012-04-30 DIAGNOSIS — R269 Unspecified abnormalities of gait and mobility: Secondary | ICD-10-CM | POA: Diagnosis not present

## 2012-04-30 DIAGNOSIS — I509 Heart failure, unspecified: Secondary | ICD-10-CM | POA: Diagnosis not present

## 2012-04-30 DIAGNOSIS — E119 Type 2 diabetes mellitus without complications: Secondary | ICD-10-CM | POA: Diagnosis not present

## 2012-04-30 DIAGNOSIS — I10 Essential (primary) hypertension: Secondary | ICD-10-CM | POA: Diagnosis not present

## 2012-05-01 DIAGNOSIS — M48061 Spinal stenosis, lumbar region without neurogenic claudication: Secondary | ICD-10-CM | POA: Diagnosis not present

## 2012-05-01 DIAGNOSIS — R269 Unspecified abnormalities of gait and mobility: Secondary | ICD-10-CM | POA: Diagnosis not present

## 2012-05-01 DIAGNOSIS — E1149 Type 2 diabetes mellitus with other diabetic neurological complication: Secondary | ICD-10-CM | POA: Diagnosis not present

## 2012-05-01 DIAGNOSIS — E119 Type 2 diabetes mellitus without complications: Secondary | ICD-10-CM | POA: Diagnosis not present

## 2012-05-01 DIAGNOSIS — I509 Heart failure, unspecified: Secondary | ICD-10-CM | POA: Diagnosis not present

## 2012-05-01 DIAGNOSIS — E1142 Type 2 diabetes mellitus with diabetic polyneuropathy: Secondary | ICD-10-CM | POA: Diagnosis not present

## 2012-05-01 DIAGNOSIS — I10 Essential (primary) hypertension: Secondary | ICD-10-CM | POA: Diagnosis not present

## 2012-05-02 DIAGNOSIS — I509 Heart failure, unspecified: Secondary | ICD-10-CM | POA: Diagnosis not present

## 2012-05-02 DIAGNOSIS — M48061 Spinal stenosis, lumbar region without neurogenic claudication: Secondary | ICD-10-CM | POA: Diagnosis not present

## 2012-05-02 DIAGNOSIS — I10 Essential (primary) hypertension: Secondary | ICD-10-CM | POA: Diagnosis not present

## 2012-05-02 DIAGNOSIS — E119 Type 2 diabetes mellitus without complications: Secondary | ICD-10-CM | POA: Diagnosis not present

## 2012-05-02 DIAGNOSIS — R269 Unspecified abnormalities of gait and mobility: Secondary | ICD-10-CM | POA: Diagnosis not present

## 2012-05-05 DIAGNOSIS — M48061 Spinal stenosis, lumbar region without neurogenic claudication: Secondary | ICD-10-CM | POA: Diagnosis not present

## 2012-05-05 DIAGNOSIS — R269 Unspecified abnormalities of gait and mobility: Secondary | ICD-10-CM | POA: Diagnosis not present

## 2012-05-05 DIAGNOSIS — I10 Essential (primary) hypertension: Secondary | ICD-10-CM | POA: Diagnosis not present

## 2012-05-05 DIAGNOSIS — E119 Type 2 diabetes mellitus without complications: Secondary | ICD-10-CM | POA: Diagnosis not present

## 2012-05-05 DIAGNOSIS — I509 Heart failure, unspecified: Secondary | ICD-10-CM | POA: Diagnosis not present

## 2012-05-07 DIAGNOSIS — R269 Unspecified abnormalities of gait and mobility: Secondary | ICD-10-CM | POA: Diagnosis not present

## 2012-05-07 DIAGNOSIS — E119 Type 2 diabetes mellitus without complications: Secondary | ICD-10-CM | POA: Diagnosis not present

## 2012-05-07 DIAGNOSIS — I10 Essential (primary) hypertension: Secondary | ICD-10-CM | POA: Diagnosis not present

## 2012-05-07 DIAGNOSIS — I509 Heart failure, unspecified: Secondary | ICD-10-CM | POA: Diagnosis not present

## 2012-05-07 DIAGNOSIS — M48061 Spinal stenosis, lumbar region without neurogenic claudication: Secondary | ICD-10-CM | POA: Diagnosis not present

## 2012-05-08 DIAGNOSIS — E119 Type 2 diabetes mellitus without complications: Secondary | ICD-10-CM | POA: Diagnosis not present

## 2012-05-08 DIAGNOSIS — R269 Unspecified abnormalities of gait and mobility: Secondary | ICD-10-CM | POA: Diagnosis not present

## 2012-05-08 DIAGNOSIS — F339 Major depressive disorder, recurrent, unspecified: Secondary | ICD-10-CM | POA: Diagnosis not present

## 2012-05-08 DIAGNOSIS — M48061 Spinal stenosis, lumbar region without neurogenic claudication: Secondary | ICD-10-CM | POA: Diagnosis not present

## 2012-05-08 DIAGNOSIS — I10 Essential (primary) hypertension: Secondary | ICD-10-CM | POA: Diagnosis not present

## 2012-05-08 DIAGNOSIS — I509 Heart failure, unspecified: Secondary | ICD-10-CM | POA: Diagnosis not present

## 2012-05-12 DIAGNOSIS — R269 Unspecified abnormalities of gait and mobility: Secondary | ICD-10-CM | POA: Diagnosis not present

## 2012-05-12 DIAGNOSIS — E119 Type 2 diabetes mellitus without complications: Secondary | ICD-10-CM | POA: Diagnosis not present

## 2012-05-12 DIAGNOSIS — M48061 Spinal stenosis, lumbar region without neurogenic claudication: Secondary | ICD-10-CM | POA: Diagnosis not present

## 2012-05-12 DIAGNOSIS — I509 Heart failure, unspecified: Secondary | ICD-10-CM | POA: Diagnosis not present

## 2012-05-12 DIAGNOSIS — I10 Essential (primary) hypertension: Secondary | ICD-10-CM | POA: Diagnosis not present

## 2012-05-13 DIAGNOSIS — E119 Type 2 diabetes mellitus without complications: Secondary | ICD-10-CM | POA: Diagnosis not present

## 2012-05-13 DIAGNOSIS — R269 Unspecified abnormalities of gait and mobility: Secondary | ICD-10-CM | POA: Diagnosis not present

## 2012-05-13 DIAGNOSIS — I509 Heart failure, unspecified: Secondary | ICD-10-CM | POA: Diagnosis not present

## 2012-05-13 DIAGNOSIS — M48061 Spinal stenosis, lumbar region without neurogenic claudication: Secondary | ICD-10-CM | POA: Diagnosis not present

## 2012-05-13 DIAGNOSIS — I10 Essential (primary) hypertension: Secondary | ICD-10-CM | POA: Diagnosis not present

## 2012-05-14 DIAGNOSIS — M48061 Spinal stenosis, lumbar region without neurogenic claudication: Secondary | ICD-10-CM | POA: Diagnosis not present

## 2012-05-14 DIAGNOSIS — E119 Type 2 diabetes mellitus without complications: Secondary | ICD-10-CM | POA: Diagnosis not present

## 2012-05-14 DIAGNOSIS — R269 Unspecified abnormalities of gait and mobility: Secondary | ICD-10-CM | POA: Diagnosis not present

## 2012-05-14 DIAGNOSIS — I509 Heart failure, unspecified: Secondary | ICD-10-CM | POA: Diagnosis not present

## 2012-05-14 DIAGNOSIS — I10 Essential (primary) hypertension: Secondary | ICD-10-CM | POA: Diagnosis not present

## 2012-05-15 DIAGNOSIS — E119 Type 2 diabetes mellitus without complications: Secondary | ICD-10-CM | POA: Diagnosis not present

## 2012-05-15 DIAGNOSIS — I10 Essential (primary) hypertension: Secondary | ICD-10-CM | POA: Diagnosis not present

## 2012-05-15 DIAGNOSIS — M48061 Spinal stenosis, lumbar region without neurogenic claudication: Secondary | ICD-10-CM | POA: Diagnosis not present

## 2012-05-15 DIAGNOSIS — R269 Unspecified abnormalities of gait and mobility: Secondary | ICD-10-CM | POA: Diagnosis not present

## 2012-05-15 DIAGNOSIS — I509 Heart failure, unspecified: Secondary | ICD-10-CM | POA: Diagnosis not present

## 2012-05-20 DIAGNOSIS — E785 Hyperlipidemia, unspecified: Secondary | ICD-10-CM | POA: Diagnosis not present

## 2012-05-20 DIAGNOSIS — R269 Unspecified abnormalities of gait and mobility: Secondary | ICD-10-CM | POA: Diagnosis not present

## 2012-05-20 DIAGNOSIS — I509 Heart failure, unspecified: Secondary | ICD-10-CM | POA: Diagnosis not present

## 2012-05-20 DIAGNOSIS — I209 Angina pectoris, unspecified: Secondary | ICD-10-CM | POA: Diagnosis not present

## 2012-05-20 DIAGNOSIS — I2510 Atherosclerotic heart disease of native coronary artery without angina pectoris: Secondary | ICD-10-CM | POA: Diagnosis not present

## 2012-05-20 DIAGNOSIS — E119 Type 2 diabetes mellitus without complications: Secondary | ICD-10-CM | POA: Diagnosis not present

## 2012-05-20 DIAGNOSIS — I4891 Unspecified atrial fibrillation: Secondary | ICD-10-CM | POA: Diagnosis not present

## 2012-05-20 DIAGNOSIS — D649 Anemia, unspecified: Secondary | ICD-10-CM | POA: Diagnosis not present

## 2012-05-20 DIAGNOSIS — I10 Essential (primary) hypertension: Secondary | ICD-10-CM | POA: Diagnosis not present

## 2012-05-20 DIAGNOSIS — M48061 Spinal stenosis, lumbar region without neurogenic claudication: Secondary | ICD-10-CM | POA: Diagnosis not present

## 2012-05-21 DIAGNOSIS — F339 Major depressive disorder, recurrent, unspecified: Secondary | ICD-10-CM | POA: Diagnosis not present

## 2012-05-26 DIAGNOSIS — E119 Type 2 diabetes mellitus without complications: Secondary | ICD-10-CM | POA: Diagnosis not present

## 2012-05-26 DIAGNOSIS — M48061 Spinal stenosis, lumbar region without neurogenic claudication: Secondary | ICD-10-CM | POA: Diagnosis not present

## 2012-05-26 DIAGNOSIS — I10 Essential (primary) hypertension: Secondary | ICD-10-CM | POA: Diagnosis not present

## 2012-05-26 DIAGNOSIS — I509 Heart failure, unspecified: Secondary | ICD-10-CM | POA: Diagnosis not present

## 2012-05-26 DIAGNOSIS — R269 Unspecified abnormalities of gait and mobility: Secondary | ICD-10-CM | POA: Diagnosis not present

## 2012-05-28 DIAGNOSIS — R269 Unspecified abnormalities of gait and mobility: Secondary | ICD-10-CM | POA: Diagnosis not present

## 2012-05-28 DIAGNOSIS — I10 Essential (primary) hypertension: Secondary | ICD-10-CM | POA: Diagnosis not present

## 2012-05-28 DIAGNOSIS — M48061 Spinal stenosis, lumbar region without neurogenic claudication: Secondary | ICD-10-CM | POA: Diagnosis not present

## 2012-05-28 DIAGNOSIS — E119 Type 2 diabetes mellitus without complications: Secondary | ICD-10-CM | POA: Diagnosis not present

## 2012-05-28 DIAGNOSIS — I509 Heart failure, unspecified: Secondary | ICD-10-CM | POA: Diagnosis not present

## 2012-05-30 DIAGNOSIS — I509 Heart failure, unspecified: Secondary | ICD-10-CM | POA: Diagnosis not present

## 2012-05-30 DIAGNOSIS — M48061 Spinal stenosis, lumbar region without neurogenic claudication: Secondary | ICD-10-CM | POA: Diagnosis not present

## 2012-05-30 DIAGNOSIS — I10 Essential (primary) hypertension: Secondary | ICD-10-CM | POA: Diagnosis not present

## 2012-05-30 DIAGNOSIS — E119 Type 2 diabetes mellitus without complications: Secondary | ICD-10-CM | POA: Diagnosis not present

## 2012-05-30 DIAGNOSIS — R269 Unspecified abnormalities of gait and mobility: Secondary | ICD-10-CM | POA: Diagnosis not present

## 2012-06-04 DIAGNOSIS — R269 Unspecified abnormalities of gait and mobility: Secondary | ICD-10-CM | POA: Diagnosis not present

## 2012-06-04 DIAGNOSIS — IMO0001 Reserved for inherently not codable concepts without codable children: Secondary | ICD-10-CM | POA: Diagnosis not present

## 2012-06-04 DIAGNOSIS — M5126 Other intervertebral disc displacement, lumbar region: Secondary | ICD-10-CM | POA: Diagnosis not present

## 2012-06-04 DIAGNOSIS — G8928 Other chronic postprocedural pain: Secondary | ICD-10-CM | POA: Diagnosis not present

## 2012-06-04 DIAGNOSIS — R262 Difficulty in walking, not elsewhere classified: Secondary | ICD-10-CM | POA: Diagnosis not present

## 2012-06-04 DIAGNOSIS — M2560 Stiffness of unspecified joint, not elsewhere classified: Secondary | ICD-10-CM | POA: Diagnosis not present

## 2012-06-04 DIAGNOSIS — M545 Low back pain, unspecified: Secondary | ICD-10-CM | POA: Diagnosis not present

## 2012-06-04 DIAGNOSIS — M79609 Pain in unspecified limb: Secondary | ICD-10-CM | POA: Diagnosis not present

## 2012-06-04 DIAGNOSIS — M6281 Muscle weakness (generalized): Secondary | ICD-10-CM | POA: Diagnosis not present

## 2012-06-05 DIAGNOSIS — E1149 Type 2 diabetes mellitus with other diabetic neurological complication: Secondary | ICD-10-CM | POA: Diagnosis not present

## 2012-06-05 DIAGNOSIS — L608 Other nail disorders: Secondary | ICD-10-CM | POA: Diagnosis not present

## 2012-06-06 DIAGNOSIS — I4891 Unspecified atrial fibrillation: Secondary | ICD-10-CM | POA: Diagnosis not present

## 2012-06-11 DIAGNOSIS — Z23 Encounter for immunization: Secondary | ICD-10-CM | POA: Diagnosis not present

## 2012-06-11 DIAGNOSIS — E119 Type 2 diabetes mellitus without complications: Secondary | ICD-10-CM | POA: Diagnosis not present

## 2012-06-11 DIAGNOSIS — G609 Hereditary and idiopathic neuropathy, unspecified: Secondary | ICD-10-CM | POA: Diagnosis not present

## 2012-06-11 DIAGNOSIS — G4730 Sleep apnea, unspecified: Secondary | ICD-10-CM | POA: Diagnosis not present

## 2012-06-11 DIAGNOSIS — F339 Major depressive disorder, recurrent, unspecified: Secondary | ICD-10-CM | POA: Diagnosis not present

## 2012-06-11 DIAGNOSIS — M159 Polyosteoarthritis, unspecified: Secondary | ICD-10-CM | POA: Diagnosis not present

## 2012-06-11 DIAGNOSIS — K745 Biliary cirrhosis, unspecified: Secondary | ICD-10-CM | POA: Diagnosis not present

## 2012-06-25 DIAGNOSIS — R262 Difficulty in walking, not elsewhere classified: Secondary | ICD-10-CM | POA: Diagnosis not present

## 2012-06-25 DIAGNOSIS — IMO0001 Reserved for inherently not codable concepts without codable children: Secondary | ICD-10-CM | POA: Diagnosis not present

## 2012-06-25 DIAGNOSIS — M5126 Other intervertebral disc displacement, lumbar region: Secondary | ICD-10-CM | POA: Diagnosis not present

## 2012-06-25 DIAGNOSIS — M6281 Muscle weakness (generalized): Secondary | ICD-10-CM | POA: Diagnosis not present

## 2012-06-25 DIAGNOSIS — M2560 Stiffness of unspecified joint, not elsewhere classified: Secondary | ICD-10-CM | POA: Diagnosis not present

## 2012-06-25 DIAGNOSIS — R269 Unspecified abnormalities of gait and mobility: Secondary | ICD-10-CM | POA: Diagnosis not present

## 2012-06-25 DIAGNOSIS — M79609 Pain in unspecified limb: Secondary | ICD-10-CM | POA: Diagnosis not present

## 2012-06-25 DIAGNOSIS — G8928 Other chronic postprocedural pain: Secondary | ICD-10-CM | POA: Diagnosis not present

## 2012-06-25 DIAGNOSIS — M545 Low back pain: Secondary | ICD-10-CM | POA: Diagnosis not present

## 2012-06-27 DIAGNOSIS — M6281 Muscle weakness (generalized): Secondary | ICD-10-CM | POA: Diagnosis not present

## 2012-06-27 DIAGNOSIS — M79609 Pain in unspecified limb: Secondary | ICD-10-CM | POA: Diagnosis not present

## 2012-06-27 DIAGNOSIS — IMO0001 Reserved for inherently not codable concepts without codable children: Secondary | ICD-10-CM | POA: Diagnosis not present

## 2012-06-27 DIAGNOSIS — M2560 Stiffness of unspecified joint, not elsewhere classified: Secondary | ICD-10-CM | POA: Diagnosis not present

## 2012-06-27 DIAGNOSIS — R262 Difficulty in walking, not elsewhere classified: Secondary | ICD-10-CM | POA: Diagnosis not present

## 2012-06-27 DIAGNOSIS — M545 Low back pain: Secondary | ICD-10-CM | POA: Diagnosis not present

## 2012-06-30 DIAGNOSIS — E1142 Type 2 diabetes mellitus with diabetic polyneuropathy: Secondary | ICD-10-CM | POA: Diagnosis not present

## 2012-06-30 DIAGNOSIS — E1149 Type 2 diabetes mellitus with other diabetic neurological complication: Secondary | ICD-10-CM | POA: Diagnosis not present

## 2012-07-02 DIAGNOSIS — M79609 Pain in unspecified limb: Secondary | ICD-10-CM | POA: Diagnosis not present

## 2012-07-02 DIAGNOSIS — M2560 Stiffness of unspecified joint, not elsewhere classified: Secondary | ICD-10-CM | POA: Diagnosis not present

## 2012-07-02 DIAGNOSIS — R262 Difficulty in walking, not elsewhere classified: Secondary | ICD-10-CM | POA: Diagnosis not present

## 2012-07-02 DIAGNOSIS — IMO0001 Reserved for inherently not codable concepts without codable children: Secondary | ICD-10-CM | POA: Diagnosis not present

## 2012-07-02 DIAGNOSIS — M545 Low back pain: Secondary | ICD-10-CM | POA: Diagnosis not present

## 2012-07-02 DIAGNOSIS — M6281 Muscle weakness (generalized): Secondary | ICD-10-CM | POA: Diagnosis not present

## 2012-07-08 DIAGNOSIS — M79609 Pain in unspecified limb: Secondary | ICD-10-CM | POA: Diagnosis not present

## 2012-07-08 DIAGNOSIS — M545 Low back pain: Secondary | ICD-10-CM | POA: Diagnosis not present

## 2012-07-08 DIAGNOSIS — R262 Difficulty in walking, not elsewhere classified: Secondary | ICD-10-CM | POA: Diagnosis not present

## 2012-07-08 DIAGNOSIS — IMO0001 Reserved for inherently not codable concepts without codable children: Secondary | ICD-10-CM | POA: Diagnosis not present

## 2012-07-08 DIAGNOSIS — M2560 Stiffness of unspecified joint, not elsewhere classified: Secondary | ICD-10-CM | POA: Diagnosis not present

## 2012-07-08 DIAGNOSIS — M6281 Muscle weakness (generalized): Secondary | ICD-10-CM | POA: Diagnosis not present

## 2012-07-11 DIAGNOSIS — M2560 Stiffness of unspecified joint, not elsewhere classified: Secondary | ICD-10-CM | POA: Diagnosis not present

## 2012-07-11 DIAGNOSIS — M545 Low back pain: Secondary | ICD-10-CM | POA: Diagnosis not present

## 2012-07-11 DIAGNOSIS — M6281 Muscle weakness (generalized): Secondary | ICD-10-CM | POA: Diagnosis not present

## 2012-07-11 DIAGNOSIS — IMO0001 Reserved for inherently not codable concepts without codable children: Secondary | ICD-10-CM | POA: Diagnosis not present

## 2012-07-11 DIAGNOSIS — R262 Difficulty in walking, not elsewhere classified: Secondary | ICD-10-CM | POA: Diagnosis not present

## 2012-07-11 DIAGNOSIS — M79609 Pain in unspecified limb: Secondary | ICD-10-CM | POA: Diagnosis not present

## 2012-07-14 DIAGNOSIS — F339 Major depressive disorder, recurrent, unspecified: Secondary | ICD-10-CM | POA: Diagnosis not present

## 2012-07-15 ENCOUNTER — Telehealth: Payer: Self-pay | Admitting: *Deleted

## 2012-07-15 DIAGNOSIS — R262 Difficulty in walking, not elsewhere classified: Secondary | ICD-10-CM | POA: Diagnosis not present

## 2012-07-15 DIAGNOSIS — M2560 Stiffness of unspecified joint, not elsewhere classified: Secondary | ICD-10-CM | POA: Diagnosis not present

## 2012-07-15 DIAGNOSIS — M79609 Pain in unspecified limb: Secondary | ICD-10-CM | POA: Diagnosis not present

## 2012-07-15 DIAGNOSIS — IMO0001 Reserved for inherently not codable concepts without codable children: Secondary | ICD-10-CM | POA: Diagnosis not present

## 2012-07-15 DIAGNOSIS — M545 Low back pain: Secondary | ICD-10-CM | POA: Diagnosis not present

## 2012-07-15 DIAGNOSIS — M6281 Muscle weakness (generalized): Secondary | ICD-10-CM | POA: Diagnosis not present

## 2012-07-15 NOTE — Telephone Encounter (Signed)
D/C her allergy vaccine based on noncompliance.

## 2012-07-15 NOTE — Telephone Encounter (Signed)
I have called pt.and left at least two messages no response. [name] [name] last shot was 01/01/12. I'm assuming she stopped her shots. Please advise.

## 2012-07-16 NOTE — Telephone Encounter (Signed)
Done

## 2012-07-18 DIAGNOSIS — R262 Difficulty in walking, not elsewhere classified: Secondary | ICD-10-CM | POA: Diagnosis not present

## 2012-07-18 DIAGNOSIS — M545 Low back pain: Secondary | ICD-10-CM | POA: Diagnosis not present

## 2012-07-18 DIAGNOSIS — M79609 Pain in unspecified limb: Secondary | ICD-10-CM | POA: Diagnosis not present

## 2012-07-18 DIAGNOSIS — M2560 Stiffness of unspecified joint, not elsewhere classified: Secondary | ICD-10-CM | POA: Diagnosis not present

## 2012-07-18 DIAGNOSIS — IMO0001 Reserved for inherently not codable concepts without codable children: Secondary | ICD-10-CM | POA: Diagnosis not present

## 2012-07-18 DIAGNOSIS — M6281 Muscle weakness (generalized): Secondary | ICD-10-CM | POA: Diagnosis not present

## 2012-07-23 DIAGNOSIS — R262 Difficulty in walking, not elsewhere classified: Secondary | ICD-10-CM | POA: Diagnosis not present

## 2012-07-23 DIAGNOSIS — M79609 Pain in unspecified limb: Secondary | ICD-10-CM | POA: Diagnosis not present

## 2012-07-23 DIAGNOSIS — M6281 Muscle weakness (generalized): Secondary | ICD-10-CM | POA: Diagnosis not present

## 2012-07-23 DIAGNOSIS — M5126 Other intervertebral disc displacement, lumbar region: Secondary | ICD-10-CM | POA: Diagnosis not present

## 2012-07-23 DIAGNOSIS — R269 Unspecified abnormalities of gait and mobility: Secondary | ICD-10-CM | POA: Diagnosis not present

## 2012-07-23 DIAGNOSIS — Z79899 Other long term (current) drug therapy: Secondary | ICD-10-CM | POA: Diagnosis not present

## 2012-07-23 DIAGNOSIS — I10 Essential (primary) hypertension: Secondary | ICD-10-CM | POA: Diagnosis not present

## 2012-07-23 DIAGNOSIS — E1149 Type 2 diabetes mellitus with other diabetic neurological complication: Secondary | ICD-10-CM | POA: Diagnosis not present

## 2012-07-23 DIAGNOSIS — L821 Other seborrheic keratosis: Secondary | ICD-10-CM | POA: Diagnosis not present

## 2012-07-23 DIAGNOSIS — B372 Candidiasis of skin and nail: Secondary | ICD-10-CM | POA: Diagnosis not present

## 2012-07-23 DIAGNOSIS — D649 Anemia, unspecified: Secondary | ICD-10-CM | POA: Diagnosis not present

## 2012-07-23 DIAGNOSIS — G8928 Other chronic postprocedural pain: Secondary | ICD-10-CM | POA: Diagnosis not present

## 2012-07-23 DIAGNOSIS — M545 Low back pain: Secondary | ICD-10-CM | POA: Diagnosis not present

## 2012-07-23 DIAGNOSIS — M2560 Stiffness of unspecified joint, not elsewhere classified: Secondary | ICD-10-CM | POA: Diagnosis not present

## 2012-07-23 DIAGNOSIS — I4891 Unspecified atrial fibrillation: Secondary | ICD-10-CM | POA: Diagnosis not present

## 2012-07-23 DIAGNOSIS — IMO0001 Reserved for inherently not codable concepts without codable children: Secondary | ICD-10-CM | POA: Diagnosis not present

## 2012-07-23 DIAGNOSIS — G609 Hereditary and idiopathic neuropathy, unspecified: Secondary | ICD-10-CM | POA: Diagnosis not present

## 2012-07-25 DIAGNOSIS — M2560 Stiffness of unspecified joint, not elsewhere classified: Secondary | ICD-10-CM | POA: Diagnosis not present

## 2012-07-25 DIAGNOSIS — M79609 Pain in unspecified limb: Secondary | ICD-10-CM | POA: Diagnosis not present

## 2012-07-25 DIAGNOSIS — M545 Low back pain, unspecified: Secondary | ICD-10-CM | POA: Diagnosis not present

## 2012-07-25 DIAGNOSIS — R262 Difficulty in walking, not elsewhere classified: Secondary | ICD-10-CM | POA: Diagnosis not present

## 2012-07-25 DIAGNOSIS — IMO0001 Reserved for inherently not codable concepts without codable children: Secondary | ICD-10-CM | POA: Diagnosis not present

## 2012-07-25 DIAGNOSIS — M6281 Muscle weakness (generalized): Secondary | ICD-10-CM | POA: Diagnosis not present

## 2012-07-28 ENCOUNTER — Other Ambulatory Visit: Payer: Self-pay | Admitting: Family Medicine

## 2012-07-28 DIAGNOSIS — Z1231 Encounter for screening mammogram for malignant neoplasm of breast: Secondary | ICD-10-CM

## 2012-07-28 NOTE — Telephone Encounter (Signed)
[name] [name] called this afternoon wanting to restart her vaccine.(you just d/c'd her 07/15/2012 because of noncompliance.) She has just gotten over another major back surgery. She is walking with a walker and has someone to drive her here from [place] . Please advise.

## 2012-07-31 DIAGNOSIS — M2560 Stiffness of unspecified joint, not elsewhere classified: Secondary | ICD-10-CM | POA: Diagnosis not present

## 2012-07-31 DIAGNOSIS — M79609 Pain in unspecified limb: Secondary | ICD-10-CM | POA: Diagnosis not present

## 2012-07-31 DIAGNOSIS — IMO0001 Reserved for inherently not codable concepts without codable children: Secondary | ICD-10-CM | POA: Diagnosis not present

## 2012-07-31 DIAGNOSIS — M545 Low back pain: Secondary | ICD-10-CM | POA: Diagnosis not present

## 2012-07-31 DIAGNOSIS — M6281 Muscle weakness (generalized): Secondary | ICD-10-CM | POA: Diagnosis not present

## 2012-07-31 DIAGNOSIS — R262 Difficulty in walking, not elsewhere classified: Secondary | ICD-10-CM | POA: Diagnosis not present

## 2012-08-05 DIAGNOSIS — M545 Low back pain: Secondary | ICD-10-CM | POA: Diagnosis not present

## 2012-08-05 DIAGNOSIS — IMO0001 Reserved for inherently not codable concepts without codable children: Secondary | ICD-10-CM | POA: Diagnosis not present

## 2012-08-05 DIAGNOSIS — M6281 Muscle weakness (generalized): Secondary | ICD-10-CM | POA: Diagnosis not present

## 2012-08-05 DIAGNOSIS — M2560 Stiffness of unspecified joint, not elsewhere classified: Secondary | ICD-10-CM | POA: Diagnosis not present

## 2012-08-05 DIAGNOSIS — M79609 Pain in unspecified limb: Secondary | ICD-10-CM | POA: Diagnosis not present

## 2012-08-05 DIAGNOSIS — R262 Difficulty in walking, not elsewhere classified: Secondary | ICD-10-CM | POA: Diagnosis not present

## 2012-08-06 DIAGNOSIS — M159 Polyosteoarthritis, unspecified: Secondary | ICD-10-CM | POA: Diagnosis not present

## 2012-08-06 DIAGNOSIS — IMO0002 Reserved for concepts with insufficient information to code with codable children: Secondary | ICD-10-CM | POA: Diagnosis not present

## 2012-08-06 DIAGNOSIS — M310 Hypersensitivity angiitis: Secondary | ICD-10-CM | POA: Diagnosis not present

## 2012-08-06 DIAGNOSIS — IMO0001 Reserved for inherently not codable concepts without codable children: Secondary | ICD-10-CM | POA: Diagnosis not present

## 2012-08-13 ENCOUNTER — Ambulatory Visit (INDEPENDENT_AMBULATORY_CARE_PROVIDER_SITE_OTHER): Payer: Medicare Other

## 2012-08-13 DIAGNOSIS — J309 Allergic rhinitis, unspecified: Secondary | ICD-10-CM

## 2012-08-22 ENCOUNTER — Ambulatory Visit (INDEPENDENT_AMBULATORY_CARE_PROVIDER_SITE_OTHER): Payer: Medicare Other

## 2012-08-22 ENCOUNTER — Ambulatory Visit
Admission: RE | Admit: 2012-08-22 | Discharge: 2012-08-22 | Disposition: A | Discharge status: Home / Self Care | Payer: Medicare Other | Source: Ambulatory Visit | Attending: Family Medicine | Admitting: Family Medicine

## 2012-08-22 DIAGNOSIS — J309 Allergic rhinitis, unspecified: Secondary | ICD-10-CM | POA: Diagnosis not present

## 2012-08-22 DIAGNOSIS — Z1231 Encounter for screening mammogram for malignant neoplasm of breast: Secondary | ICD-10-CM

## 2012-08-25 IMAGING — MG MM DIGITAL SCREENING BILAT
8 series · 8 of 24 positions shown · non-contrast
Comparison: Previous exams.

CLINICAL DATA: Screening.

DIGITAL BILATERAL SCREENING MAMMOGRAM WITH CAD
DIGITAL BREAST TOMOSYNTHESIS
Digital breast tomosynthesis images are acquired in two
projections.  These images are reviewed in combination with the
digital mammogram, confirming the findings below.

[R MLO]
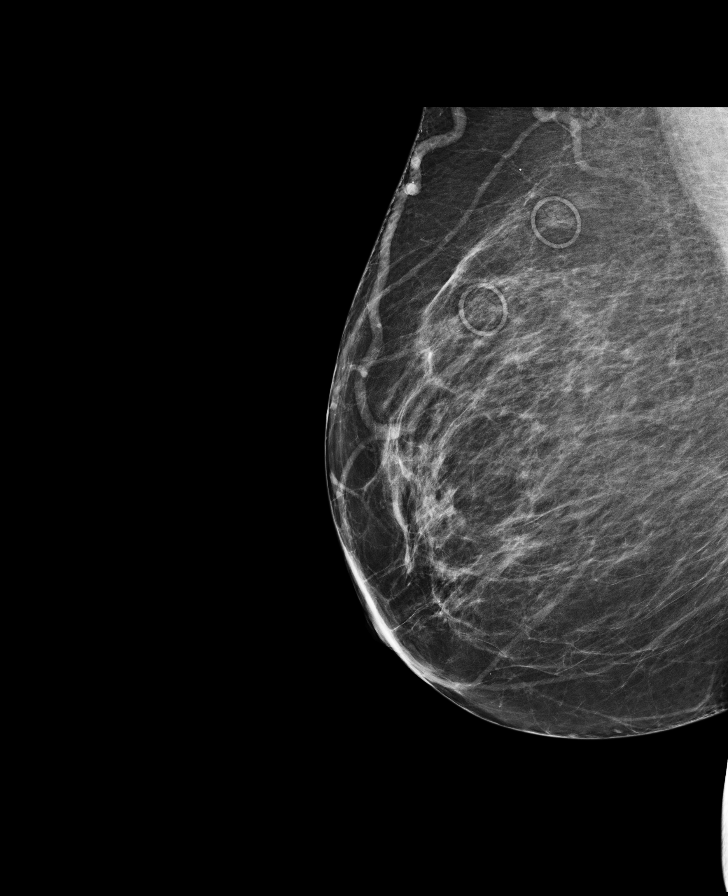

[R CC]
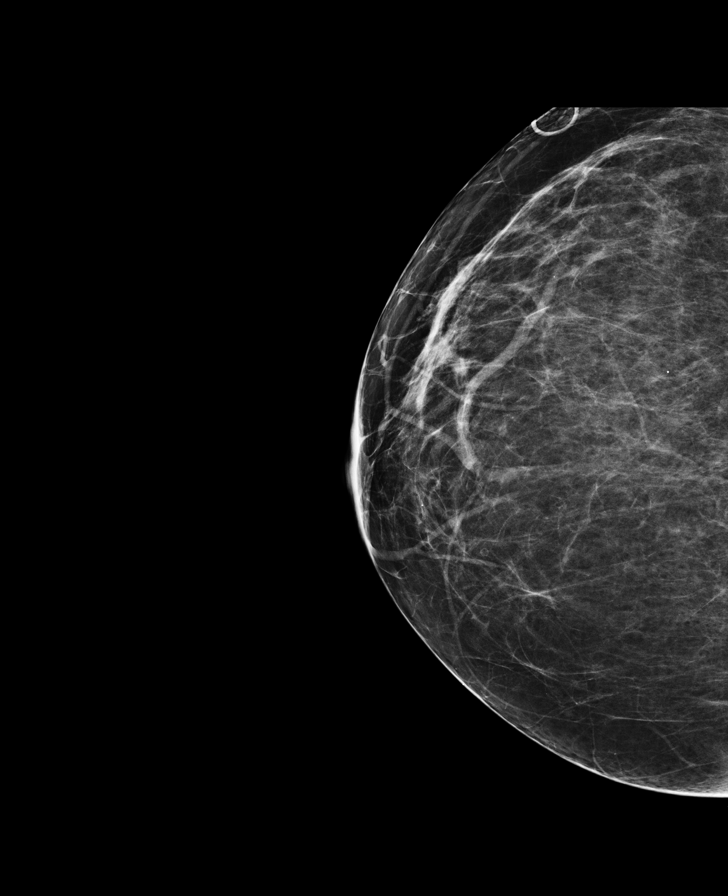

[L MLO]
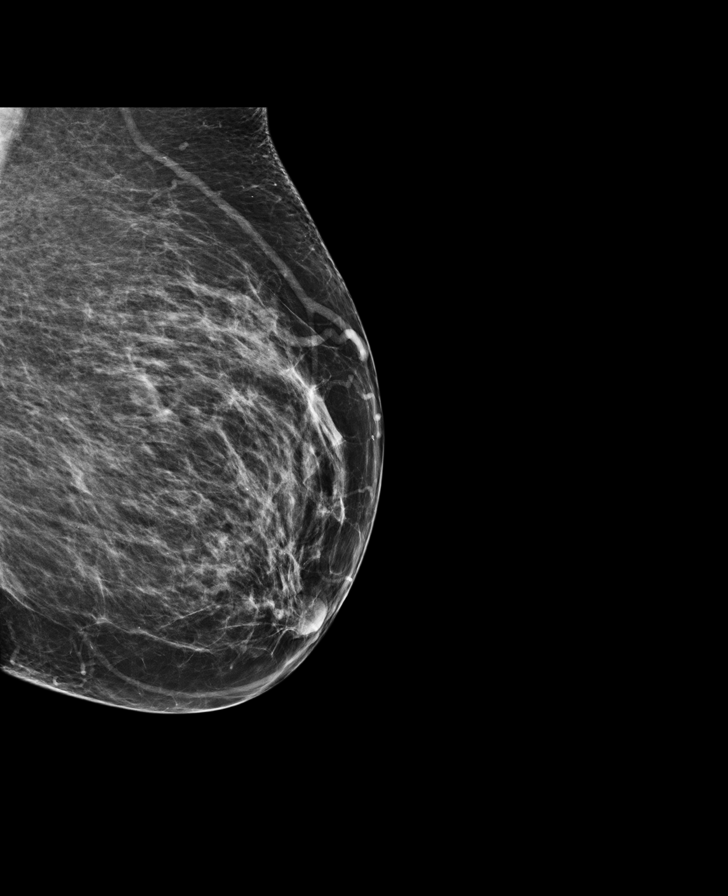

[L CC]
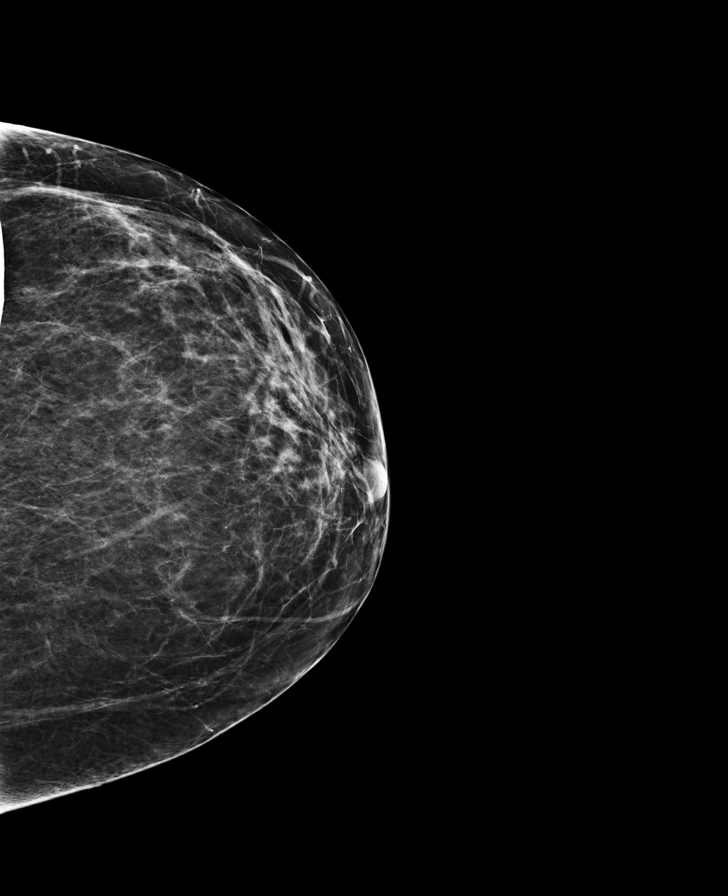

[LCC COMBO BREAST TOMOSYNTHESIS IMAGE tomo · tomo slice 28/55.0]
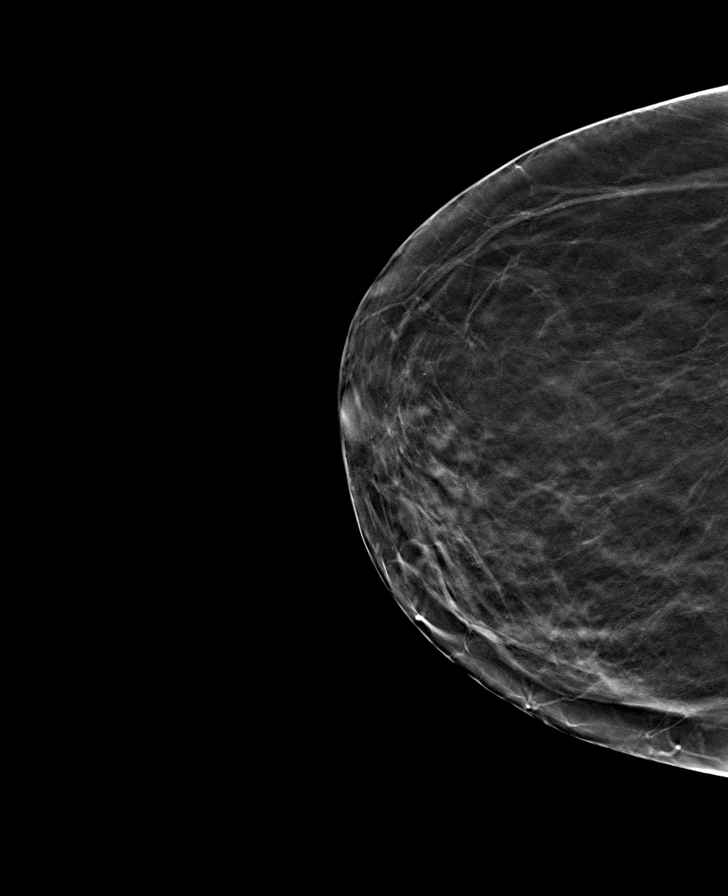

[LMLO COMBO BREAST TOMOSYNTHESIS IMAGE tomo · tomo slice 32/63.0]
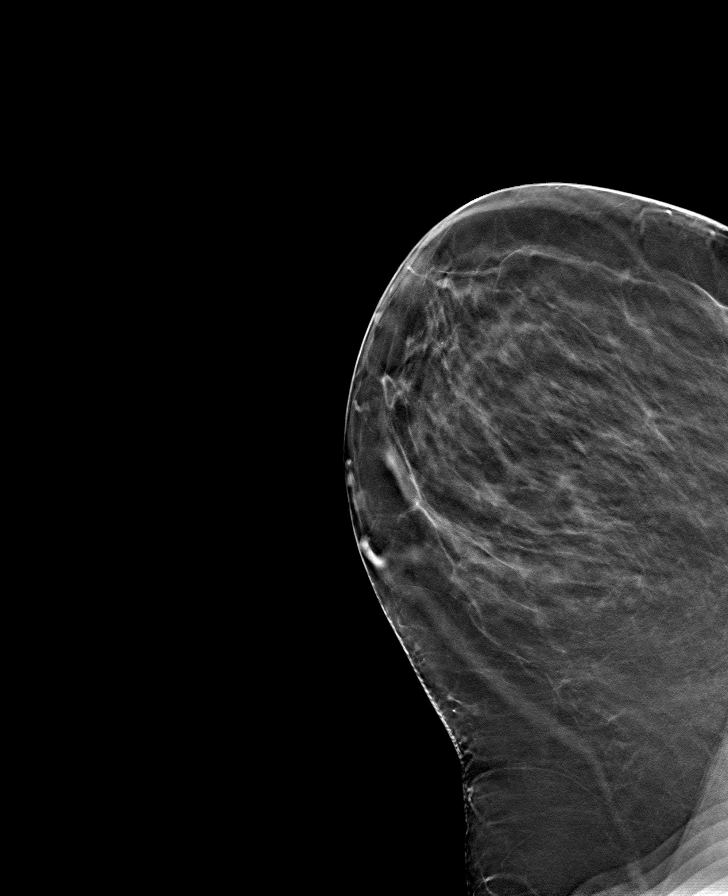

[RCC COMBO BREAST TOMOSYNTHESIS IMAGE tomo · tomo slice 29/58.0]
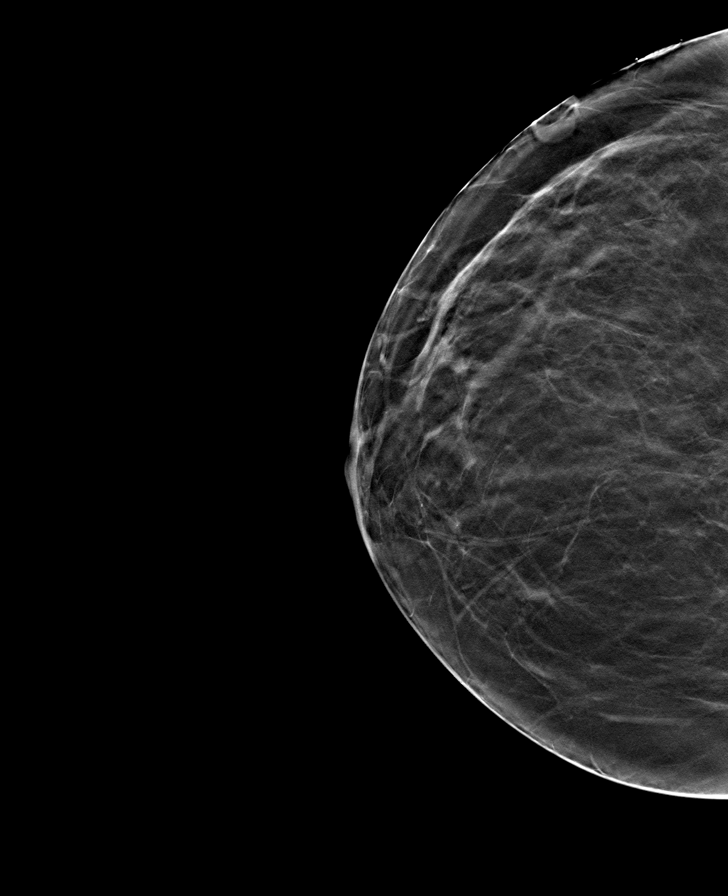

[RMLO COMBO BREAST TOMOSYNTHESIS IMAGE tomo · tomo slice 34/67.0]
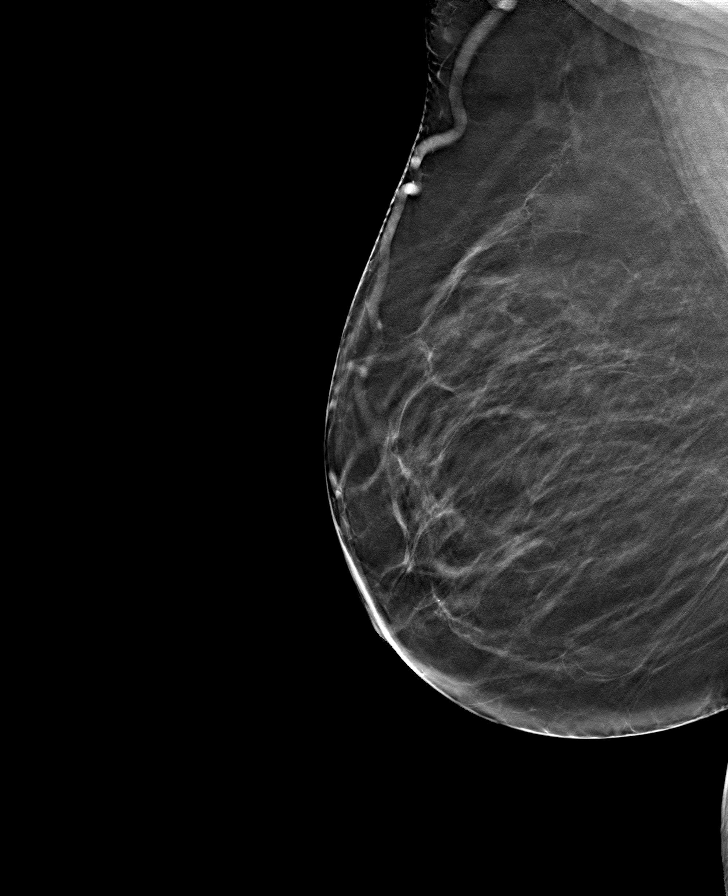

[8 of 24 positions shown; findings below may reference images not displayed]

FINDINGS: There are scattered fibroglandular densities. No
suspicious masses, architectural distortion, or calcifications are
present.

Images were processed with CAD.
IMPRESSION: No mammographic evidence of malignancy.

A result letter of this screening mammogram will be mailed directly
to the patient.

RECOMMENDATION:
Screening mammogram in one year. (Code:WK-W-HXX)

BI-RADS CATEGORY 1:  Negative.

## 2012-09-02 ENCOUNTER — Ambulatory Visit (INDEPENDENT_AMBULATORY_CARE_PROVIDER_SITE_OTHER): Payer: Medicare Other

## 2012-09-02 DIAGNOSIS — E1149 Type 2 diabetes mellitus with other diabetic neurological complication: Secondary | ICD-10-CM | POA: Diagnosis not present

## 2012-09-02 DIAGNOSIS — J309 Allergic rhinitis, unspecified: Secondary | ICD-10-CM | POA: Diagnosis not present

## 2012-09-02 DIAGNOSIS — E1142 Type 2 diabetes mellitus with diabetic polyneuropathy: Secondary | ICD-10-CM | POA: Diagnosis not present

## 2012-09-09 ENCOUNTER — Ambulatory Visit (INDEPENDENT_AMBULATORY_CARE_PROVIDER_SITE_OTHER): Payer: Medicare Other

## 2012-09-09 DIAGNOSIS — J309 Allergic rhinitis, unspecified: Secondary | ICD-10-CM

## 2012-09-11 DIAGNOSIS — L608 Other nail disorders: Secondary | ICD-10-CM | POA: Diagnosis not present

## 2012-09-11 DIAGNOSIS — K7460 Unspecified cirrhosis of liver: Secondary | ICD-10-CM | POA: Diagnosis not present

## 2012-09-11 DIAGNOSIS — E1149 Type 2 diabetes mellitus with other diabetic neurological complication: Secondary | ICD-10-CM | POA: Diagnosis not present

## 2012-09-11 DIAGNOSIS — F339 Major depressive disorder, recurrent, unspecified: Secondary | ICD-10-CM | POA: Diagnosis not present

## 2012-09-15 ENCOUNTER — Ambulatory Visit (INDEPENDENT_AMBULATORY_CARE_PROVIDER_SITE_OTHER): Payer: Medicare Other

## 2012-09-15 DIAGNOSIS — J309 Allergic rhinitis, unspecified: Secondary | ICD-10-CM | POA: Diagnosis not present

## 2012-09-15 DIAGNOSIS — E1149 Type 2 diabetes mellitus with other diabetic neurological complication: Secondary | ICD-10-CM | POA: Diagnosis not present

## 2012-09-16 DIAGNOSIS — K7460 Unspecified cirrhosis of liver: Secondary | ICD-10-CM | POA: Diagnosis not present

## 2012-09-16 DIAGNOSIS — D649 Anemia, unspecified: Secondary | ICD-10-CM | POA: Diagnosis not present

## 2012-09-19 DIAGNOSIS — K7460 Unspecified cirrhosis of liver: Secondary | ICD-10-CM | POA: Diagnosis not present

## 2012-09-22 DIAGNOSIS — L821 Other seborrheic keratosis: Secondary | ICD-10-CM | POA: Diagnosis not present

## 2012-09-22 DIAGNOSIS — E1149 Type 2 diabetes mellitus with other diabetic neurological complication: Secondary | ICD-10-CM | POA: Diagnosis not present

## 2012-09-22 DIAGNOSIS — I10 Essential (primary) hypertension: Secondary | ICD-10-CM | POA: Diagnosis not present

## 2012-09-22 DIAGNOSIS — E669 Obesity, unspecified: Secondary | ICD-10-CM | POA: Diagnosis not present

## 2012-09-22 DIAGNOSIS — I4891 Unspecified atrial fibrillation: Secondary | ICD-10-CM | POA: Diagnosis not present

## 2012-09-22 DIAGNOSIS — D649 Anemia, unspecified: Secondary | ICD-10-CM | POA: Diagnosis not present

## 2012-09-22 DIAGNOSIS — L820 Inflamed seborrheic keratosis: Secondary | ICD-10-CM | POA: Diagnosis not present

## 2012-09-30 ENCOUNTER — Ambulatory Visit (INDEPENDENT_AMBULATORY_CARE_PROVIDER_SITE_OTHER): Payer: Medicare Other

## 2012-09-30 DIAGNOSIS — J309 Allergic rhinitis, unspecified: Secondary | ICD-10-CM | POA: Diagnosis not present

## 2012-10-07 DIAGNOSIS — E119 Type 2 diabetes mellitus without complications: Secondary | ICD-10-CM | POA: Diagnosis not present

## 2012-10-07 DIAGNOSIS — E11359 Type 2 diabetes mellitus with proliferative diabetic retinopathy without macular edema: Secondary | ICD-10-CM | POA: Diagnosis not present

## 2012-10-10 ENCOUNTER — Ambulatory Visit (INDEPENDENT_AMBULATORY_CARE_PROVIDER_SITE_OTHER): Payer: Medicare Other

## 2012-10-10 DIAGNOSIS — J309 Allergic rhinitis, unspecified: Secondary | ICD-10-CM

## 2012-10-20 DIAGNOSIS — E1142 Type 2 diabetes mellitus with diabetic polyneuropathy: Secondary | ICD-10-CM | POA: Diagnosis not present

## 2012-10-20 DIAGNOSIS — E1149 Type 2 diabetes mellitus with other diabetic neurological complication: Secondary | ICD-10-CM | POA: Diagnosis not present

## 2012-10-30 ENCOUNTER — Ambulatory Visit (INDEPENDENT_AMBULATORY_CARE_PROVIDER_SITE_OTHER): Payer: Medicare Other

## 2012-10-30 DIAGNOSIS — J309 Allergic rhinitis, unspecified: Secondary | ICD-10-CM

## 2012-11-03 ENCOUNTER — Ambulatory Visit (INDEPENDENT_AMBULATORY_CARE_PROVIDER_SITE_OTHER): Payer: Medicare Other

## 2012-11-03 DIAGNOSIS — J309 Allergic rhinitis, unspecified: Secondary | ICD-10-CM | POA: Diagnosis not present

## 2012-11-06 DIAGNOSIS — F339 Major depressive disorder, recurrent, unspecified: Secondary | ICD-10-CM | POA: Diagnosis not present

## 2012-11-20 ENCOUNTER — Ambulatory Visit (INDEPENDENT_AMBULATORY_CARE_PROVIDER_SITE_OTHER): Payer: Medicare Other

## 2012-11-20 DIAGNOSIS — J309 Allergic rhinitis, unspecified: Secondary | ICD-10-CM

## 2012-11-21 ENCOUNTER — Ambulatory Visit (INDEPENDENT_AMBULATORY_CARE_PROVIDER_SITE_OTHER): Payer: Medicare Other

## 2012-11-21 DIAGNOSIS — J309 Allergic rhinitis, unspecified: Secondary | ICD-10-CM

## 2012-11-25 DIAGNOSIS — I4891 Unspecified atrial fibrillation: Secondary | ICD-10-CM | POA: Diagnosis not present

## 2012-11-25 DIAGNOSIS — E1149 Type 2 diabetes mellitus with other diabetic neurological complication: Secondary | ICD-10-CM | POA: Diagnosis not present

## 2012-11-25 DIAGNOSIS — I10 Essential (primary) hypertension: Secondary | ICD-10-CM | POA: Diagnosis not present

## 2012-11-25 DIAGNOSIS — M159 Polyosteoarthritis, unspecified: Secondary | ICD-10-CM | POA: Diagnosis not present

## 2012-11-25 DIAGNOSIS — D649 Anemia, unspecified: Secondary | ICD-10-CM | POA: Diagnosis not present

## 2012-12-04 ENCOUNTER — Ambulatory Visit (INDEPENDENT_AMBULATORY_CARE_PROVIDER_SITE_OTHER): Payer: Medicare Other

## 2012-12-04 DIAGNOSIS — J309 Allergic rhinitis, unspecified: Secondary | ICD-10-CM | POA: Diagnosis not present

## 2012-12-11 ENCOUNTER — Ambulatory Visit (INDEPENDENT_AMBULATORY_CARE_PROVIDER_SITE_OTHER): Payer: Medicare Other

## 2012-12-11 DIAGNOSIS — J309 Allergic rhinitis, unspecified: Secondary | ICD-10-CM | POA: Diagnosis not present

## 2012-12-22 ENCOUNTER — Ambulatory Visit (INDEPENDENT_AMBULATORY_CARE_PROVIDER_SITE_OTHER): Payer: Medicare Other

## 2012-12-22 DIAGNOSIS — J309 Allergic rhinitis, unspecified: Secondary | ICD-10-CM

## 2012-12-24 DIAGNOSIS — E1142 Type 2 diabetes mellitus with diabetic polyneuropathy: Secondary | ICD-10-CM | POA: Diagnosis not present

## 2012-12-24 DIAGNOSIS — E1149 Type 2 diabetes mellitus with other diabetic neurological complication: Secondary | ICD-10-CM | POA: Diagnosis not present

## 2012-12-25 ENCOUNTER — Ambulatory Visit: Payer: Medicare Other | Admitting: Internal Medicine

## 2013-01-01 ENCOUNTER — Ambulatory Visit: Payer: Medicare Other

## 2013-01-07 DIAGNOSIS — F339 Major depressive disorder, recurrent, unspecified: Secondary | ICD-10-CM | POA: Diagnosis not present

## 2013-01-08 ENCOUNTER — Ambulatory Visit (INDEPENDENT_AMBULATORY_CARE_PROVIDER_SITE_OTHER): Payer: Medicare Other

## 2013-01-08 DIAGNOSIS — J309 Allergic rhinitis, unspecified: Secondary | ICD-10-CM

## 2013-01-15 ENCOUNTER — Ambulatory Visit: Payer: Medicare Other

## 2013-01-21 ENCOUNTER — Ambulatory Visit (INDEPENDENT_AMBULATORY_CARE_PROVIDER_SITE_OTHER): Payer: Medicare Other | Admitting: Internal Medicine

## 2013-01-21 ENCOUNTER — Ambulatory Visit (INDEPENDENT_AMBULATORY_CARE_PROVIDER_SITE_OTHER): Payer: Medicare Other

## 2013-01-21 ENCOUNTER — Encounter: Payer: Self-pay | Admitting: Internal Medicine

## 2013-01-21 VITALS — BP 130/74 | HR 59 | Ht 65.0 in | Wt 221.0 lb

## 2013-01-21 DIAGNOSIS — G4733 Obstructive sleep apnea (adult) (pediatric): Secondary | ICD-10-CM | POA: Diagnosis not present

## 2013-01-21 DIAGNOSIS — J309 Allergic rhinitis, unspecified: Secondary | ICD-10-CM

## 2013-01-21 DIAGNOSIS — J301 Allergic rhinitis due to pollen: Secondary | ICD-10-CM

## 2013-01-21 MED ORDER — FLUTICASONE PROPIONATE 50 MCG/ACT NA SUSP: 2.0000 | Freq: Every day | NASAL | Status: DC | PRN

## 2013-01-21 NOTE — Patient Instructions (Addendum)
We can continue allergy shots  We can continue CPAP/ auto/ [employer]  Refilled flonase  Please call as needed

## 2013-01-21 NOTE — Progress Notes (Signed)
10/19/12- 65 yoF never smoker, followed for OSA, chronic bronchitis LOV- 09/20/10 Since last here she had lumbar spine surgery and was at a nursing facility for rehabilitation. Back pain limits walking but she is doing better, using a cane or a walker now. Notices a persistent loose cough with clear sputum. At times she may cough until she retches. She does not feel significant postnasal drainage or reflux. Continues CPAP [employer] all night every night "I have to have it". She resumed allergy shots here and says they do help. 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  01/21/13- 65 yoF never smoker, followed for OSA, chronic bronchitis, allergic rhinitis FOLLOWS ZOX:WRUEA on vaccine and doing well; wears CPAP every night for about 7-9 hours; pressure working well for patient as well. Had back surgery in March of 2013 , left with foot drop, so she wears ankle braces now. Continues CPAP [employer] is still says she "has to have it". Uses CPAP all night every night. Continues Flonase. Continues allergy vaccine 1:5000 GH. She had had to drop off for a while when she was at nursing home for rehabilitation. She is now rebuilding without problems.   ROS-see HPI Constitutional:   No-   weight loss, night sweats, fevers, chills, fatigue, lassitude. HEENT:   No-  headaches, difficulty swallowing, tooth/dental problems, sore throat,       No-  sneezing, itching, ear ache, nasal congestion, post nasal drip,  CV:  No-   chest pain, orthopnea, PND, swelling in lower extremities, anasarca, dizziness, palpitations Resp: No-   shortness of breath with exertion or at rest.             +productive cough,  No non-productive cough,  No- coughing up of blood.              No-   change in color of mucus.  No- wheezing.   Skin: No-   rash or lesions. GI:  No-   heartburn, indigestion, abdominal pain, nausea, vomiting,  GU:  MS:  + joint pain or swelling. + back pain. Neuro-     nothing  unusual Psych:  No- change in mood or affect. No depression or anxiety.  No memory loss.  OBJ- Physical Exam General- Alert, Oriented, Affect-appropriate, Distress- none acute, overweight Skin- rash-none, lesions- none, excoriation- none Lymphadenopathy- none Head- atraumatic            Eyes- Gross vision intact, PERRLA, conjunctivae and secretions clear            Ears- Hearing, canals-normal            Nose- Clear, no-Septal dev, mucus, polyps, erosion, perforation             Throat- Mallampati II-III , mucosa clear , drainage- none, tonsils- atrophic Neck- flexible , trachea midline, no stridor , thyroid nl, carotid no bruit Chest - symmetrical excursion , unlabored           Heart/CV- RRR , no murmur , no gallop  , no rub, nl s1 s2                           - JVD- none , edema- none, stasis changes- none, varices- none           Lung- clear to P&A, wheeze- none, cough- none , dullness-none, rub- none           Chest wall-  Abd-  Br/ Gen/ Rectal- Not done, not  indicated Extrem- cyanosis- none, clubbing, none, atrophy- none, strength- nl. ankle brace, both ankles. Neuro- grossly intact to observation

## 2013-01-22 NOTE — Assessment & Plan Note (Signed)
Continues with allergy vaccine. She had been on hiatus while at nursing home but is building back now. She again insists this does help her.

## 2013-01-22 NOTE — Assessment & Plan Note (Signed)
Good compliance and control with no changes necessary. Weight loss would still help

## 2013-01-29 ENCOUNTER — Ambulatory Visit: Payer: Medicare Other

## 2013-02-05 ENCOUNTER — Ambulatory Visit: Payer: Medicare Other

## 2013-02-12 ENCOUNTER — Ambulatory Visit (INDEPENDENT_AMBULATORY_CARE_PROVIDER_SITE_OTHER): Payer: Medicare Other

## 2013-02-12 DIAGNOSIS — E1149 Type 2 diabetes mellitus with other diabetic neurological complication: Secondary | ICD-10-CM | POA: Diagnosis not present

## 2013-02-12 DIAGNOSIS — L608 Other nail disorders: Secondary | ICD-10-CM | POA: Diagnosis not present

## 2013-02-12 DIAGNOSIS — M545 Low back pain, unspecified: Secondary | ICD-10-CM | POA: Diagnosis not present

## 2013-02-12 DIAGNOSIS — J309 Allergic rhinitis, unspecified: Secondary | ICD-10-CM | POA: Diagnosis not present

## 2013-02-19 ENCOUNTER — Ambulatory Visit: Payer: Medicare Other

## 2013-02-23 DIAGNOSIS — E1149 Type 2 diabetes mellitus with other diabetic neurological complication: Secondary | ICD-10-CM | POA: Diagnosis not present

## 2013-02-23 DIAGNOSIS — Z79899 Other long term (current) drug therapy: Secondary | ICD-10-CM | POA: Diagnosis not present

## 2013-02-23 DIAGNOSIS — I4891 Unspecified atrial fibrillation: Secondary | ICD-10-CM | POA: Diagnosis not present

## 2013-02-23 DIAGNOSIS — D649 Anemia, unspecified: Secondary | ICD-10-CM | POA: Diagnosis not present

## 2013-02-23 DIAGNOSIS — I10 Essential (primary) hypertension: Secondary | ICD-10-CM | POA: Diagnosis not present

## 2013-02-24 ENCOUNTER — Ambulatory Visit (INDEPENDENT_AMBULATORY_CARE_PROVIDER_SITE_OTHER): Payer: Medicare Other

## 2013-02-24 DIAGNOSIS — J309 Allergic rhinitis, unspecified: Secondary | ICD-10-CM

## 2013-02-24 DIAGNOSIS — M159 Polyosteoarthritis, unspecified: Secondary | ICD-10-CM | POA: Diagnosis not present

## 2013-02-24 DIAGNOSIS — M310 Hypersensitivity angiitis: Secondary | ICD-10-CM | POA: Diagnosis not present

## 2013-02-24 DIAGNOSIS — IMO0002 Reserved for concepts with insufficient information to code with codable children: Secondary | ICD-10-CM | POA: Diagnosis not present

## 2013-02-24 DIAGNOSIS — M25569 Pain in unspecified knee: Secondary | ICD-10-CM | POA: Diagnosis not present

## 2013-02-24 DIAGNOSIS — IMO0001 Reserved for inherently not codable concepts without codable children: Secondary | ICD-10-CM | POA: Diagnosis not present

## 2013-02-24 DIAGNOSIS — M1710 Unilateral primary osteoarthritis, unspecified knee: Secondary | ICD-10-CM | POA: Diagnosis not present

## 2013-02-25 DIAGNOSIS — E1149 Type 2 diabetes mellitus with other diabetic neurological complication: Secondary | ICD-10-CM | POA: Diagnosis not present

## 2013-02-25 DIAGNOSIS — D649 Anemia, unspecified: Secondary | ICD-10-CM | POA: Diagnosis not present

## 2013-02-25 DIAGNOSIS — K7460 Unspecified cirrhosis of liver: Secondary | ICD-10-CM | POA: Diagnosis not present

## 2013-02-25 DIAGNOSIS — R161 Splenomegaly, not elsewhere classified: Secondary | ICD-10-CM | POA: Diagnosis not present

## 2013-02-26 DIAGNOSIS — K219 Gastro-esophageal reflux disease without esophagitis: Secondary | ICD-10-CM | POA: Diagnosis not present

## 2013-02-26 DIAGNOSIS — K7460 Unspecified cirrhosis of liver: Secondary | ICD-10-CM | POA: Diagnosis not present

## 2013-03-09 ENCOUNTER — Ambulatory Visit (INDEPENDENT_AMBULATORY_CARE_PROVIDER_SITE_OTHER): Payer: Medicare Other

## 2013-03-09 ENCOUNTER — Encounter: Payer: Self-pay | Admitting: *Deleted

## 2013-03-09 DIAGNOSIS — D649 Anemia, unspecified: Secondary | ICD-10-CM | POA: Diagnosis not present

## 2013-03-09 DIAGNOSIS — E785 Hyperlipidemia, unspecified: Secondary | ICD-10-CM | POA: Diagnosis not present

## 2013-03-09 DIAGNOSIS — I209 Angina pectoris, unspecified: Secondary | ICD-10-CM | POA: Diagnosis not present

## 2013-03-09 DIAGNOSIS — I2510 Atherosclerotic heart disease of native coronary artery without angina pectoris: Secondary | ICD-10-CM | POA: Diagnosis not present

## 2013-03-09 DIAGNOSIS — J309 Allergic rhinitis, unspecified: Secondary | ICD-10-CM | POA: Diagnosis not present

## 2013-03-09 DIAGNOSIS — K7460 Unspecified cirrhosis of liver: Secondary | ICD-10-CM | POA: Diagnosis not present

## 2013-03-09 DIAGNOSIS — I10 Essential (primary) hypertension: Secondary | ICD-10-CM | POA: Diagnosis not present

## 2013-03-09 DIAGNOSIS — I4891 Unspecified atrial fibrillation: Secondary | ICD-10-CM | POA: Diagnosis not present

## 2013-03-09 DIAGNOSIS — E119 Type 2 diabetes mellitus without complications: Secondary | ICD-10-CM | POA: Diagnosis not present

## 2013-03-11 DIAGNOSIS — IMO0002 Reserved for concepts with insufficient information to code with codable children: Secondary | ICD-10-CM | POA: Diagnosis not present

## 2013-03-11 DIAGNOSIS — M899 Disorder of bone, unspecified: Secondary | ICD-10-CM | POA: Diagnosis not present

## 2013-03-11 DIAGNOSIS — M949 Disorder of cartilage, unspecified: Secondary | ICD-10-CM | POA: Diagnosis not present

## 2013-03-16 ENCOUNTER — Ambulatory Visit (INDEPENDENT_AMBULATORY_CARE_PROVIDER_SITE_OTHER): Payer: Medicare Other

## 2013-03-16 DIAGNOSIS — M1990 Unspecified osteoarthritis, unspecified site: Secondary | ICD-10-CM | POA: Diagnosis not present

## 2013-03-16 DIAGNOSIS — M79609 Pain in unspecified limb: Secondary | ICD-10-CM | POA: Diagnosis not present

## 2013-03-16 DIAGNOSIS — G894 Chronic pain syndrome: Secondary | ICD-10-CM | POA: Diagnosis not present

## 2013-03-16 DIAGNOSIS — J309 Allergic rhinitis, unspecified: Secondary | ICD-10-CM | POA: Diagnosis not present

## 2013-03-16 DIAGNOSIS — Z79899 Other long term (current) drug therapy: Secondary | ICD-10-CM | POA: Diagnosis not present

## 2013-03-23 ENCOUNTER — Ambulatory Visit: Payer: Medicare Other

## 2013-03-24 DIAGNOSIS — E1149 Type 2 diabetes mellitus with other diabetic neurological complication: Secondary | ICD-10-CM | POA: Diagnosis not present

## 2013-03-24 DIAGNOSIS — E1142 Type 2 diabetes mellitus with diabetic polyneuropathy: Secondary | ICD-10-CM | POA: Diagnosis not present

## 2013-04-06 DIAGNOSIS — E1139 Type 2 diabetes mellitus with other diabetic ophthalmic complication: Secondary | ICD-10-CM | POA: Diagnosis not present

## 2013-04-06 DIAGNOSIS — E11359 Type 2 diabetes mellitus with proliferative diabetic retinopathy without macular edema: Secondary | ICD-10-CM | POA: Diagnosis not present

## 2013-04-07 DIAGNOSIS — F339 Major depressive disorder, recurrent, unspecified: Secondary | ICD-10-CM | POA: Diagnosis not present

## 2013-04-15 ENCOUNTER — Ambulatory Visit (INDEPENDENT_AMBULATORY_CARE_PROVIDER_SITE_OTHER): Payer: Medicare Other

## 2013-04-15 DIAGNOSIS — J309 Allergic rhinitis, unspecified: Secondary | ICD-10-CM

## 2013-04-15 DIAGNOSIS — G894 Chronic pain syndrome: Secondary | ICD-10-CM | POA: Diagnosis not present

## 2013-04-15 DIAGNOSIS — M961 Postlaminectomy syndrome, not elsewhere classified: Secondary | ICD-10-CM | POA: Diagnosis not present

## 2013-04-15 DIAGNOSIS — M1990 Unspecified osteoarthritis, unspecified site: Secondary | ICD-10-CM | POA: Diagnosis not present

## 2013-04-15 DIAGNOSIS — M5137 Other intervertebral disc degeneration, lumbosacral region: Secondary | ICD-10-CM | POA: Diagnosis not present

## 2013-04-16 ENCOUNTER — Other Ambulatory Visit: Payer: Self-pay | Admitting: Pain Medicine

## 2013-04-16 DIAGNOSIS — M545 Low back pain: Secondary | ICD-10-CM

## 2013-04-17 ENCOUNTER — Ambulatory Visit (INDEPENDENT_AMBULATORY_CARE_PROVIDER_SITE_OTHER): Payer: Medicare Other

## 2013-04-17 DIAGNOSIS — J309 Allergic rhinitis, unspecified: Secondary | ICD-10-CM | POA: Diagnosis not present

## 2013-04-23 ENCOUNTER — Ambulatory Visit: Payer: Medicare Other

## 2013-04-28 ENCOUNTER — Ambulatory Visit
Admission: RE | Admit: 2013-04-28 | Discharge: 2013-04-28 | Disposition: A | Discharge status: Home / Self Care | Payer: Medicare Other | Source: Ambulatory Visit | Attending: Pain Medicine | Admitting: Pain Medicine

## 2013-04-28 ENCOUNTER — Ambulatory Visit (INDEPENDENT_AMBULATORY_CARE_PROVIDER_SITE_OTHER): Payer: Medicare Other

## 2013-04-28 DIAGNOSIS — M5137 Other intervertebral disc degeneration, lumbosacral region: Secondary | ICD-10-CM | POA: Diagnosis not present

## 2013-04-28 DIAGNOSIS — J309 Allergic rhinitis, unspecified: Secondary | ICD-10-CM

## 2013-04-28 DIAGNOSIS — M47817 Spondylosis without myelopathy or radiculopathy, lumbosacral region: Secondary | ICD-10-CM | POA: Diagnosis not present

## 2013-04-28 DIAGNOSIS — M48061 Spinal stenosis, lumbar region without neurogenic claudication: Secondary | ICD-10-CM | POA: Diagnosis not present

## 2013-04-28 DIAGNOSIS — M545 Low back pain: Secondary | ICD-10-CM

## 2013-04-28 IMAGING — CT CT L SPINE W/O CM
4 of 11 series · 13 of 33 positions shown, 15 images · non-contrast
Comparison: None.

CLINICAL DATA: Low back pain.  History of multiple spinal silk
operations most recently [DATE].  Fall with a few months ago.  Left
leg weakness.

CT LUMBAR SPINE WITHOUT CONTRAST
TECHNIQUE: Multidetector CT imaging of the lumbar spine was
performed without intravenous contrast administration. Multiplanar
CT image reconstructions were also generated.

[Series 2: l spine bone · axial · 0.32mm/px · z∈[-86,+122]mm · 3 of 84 slices shown, 4 images]
[im 1/84  soft-tissue]
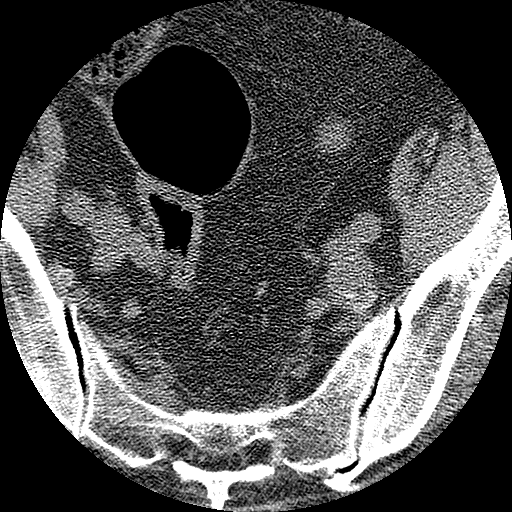
[im 1/84  bone]
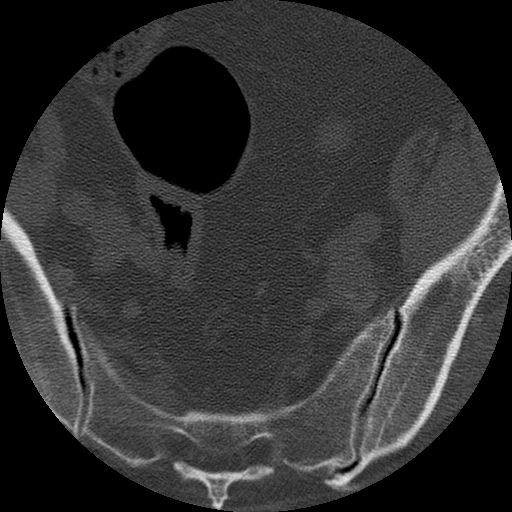
[im 42/84  bone]
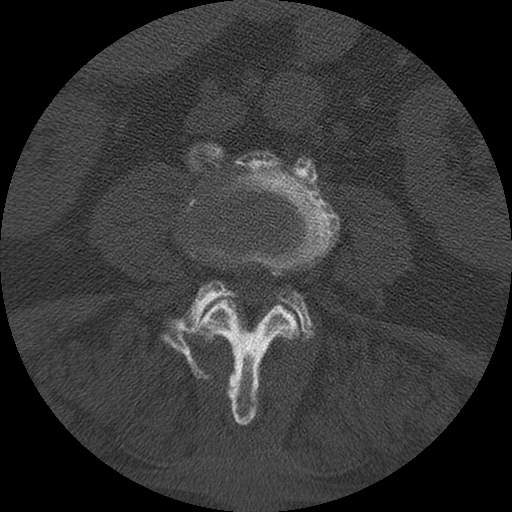
[im 84/84  bone]
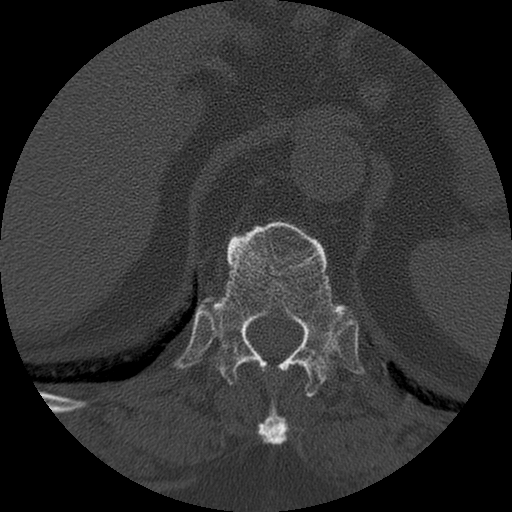

[Series 3: l spine soft · axial · 0.32mm/px · z∈[-18,+52]mm · 2 of 84 slices shown]
[im 28/84  soft-tissue]
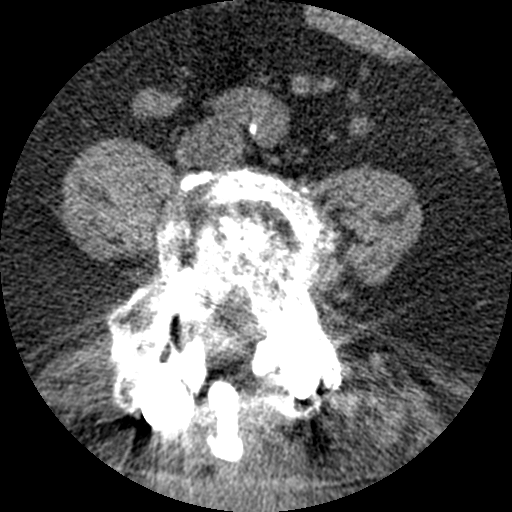
[im 56/84  soft-tissue]
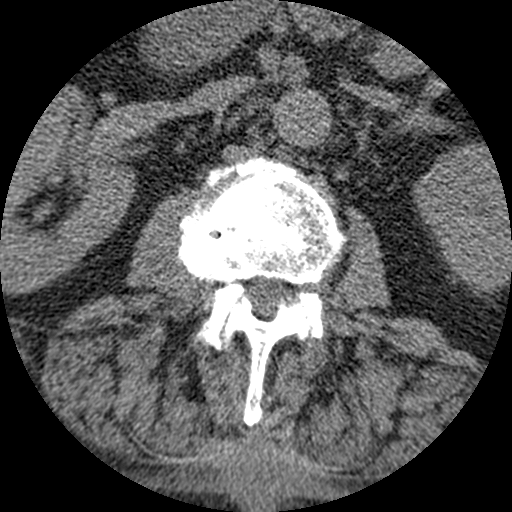

[Series 400: sagittal · sagittal · 0.42mm/px · 5 of 50 slices shown, 6 images]
[im 17/50  bone]
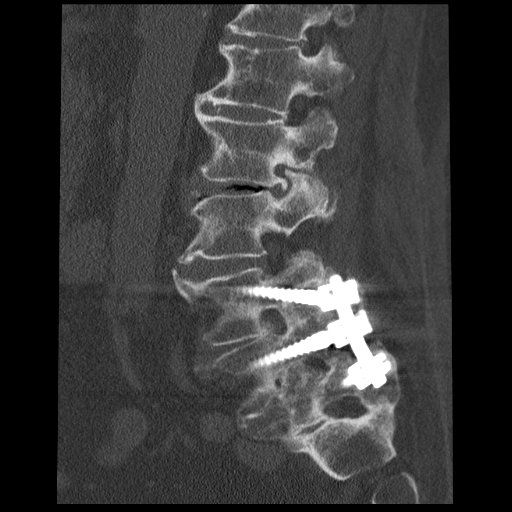
[im 21/50  bone]
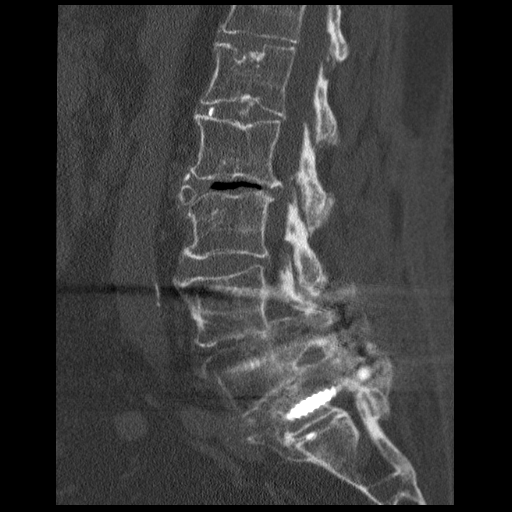
[im 25/50  soft-tissue]
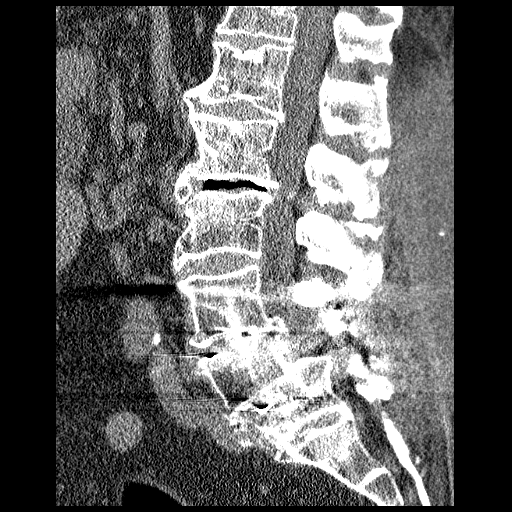
[im 25/50  bone]
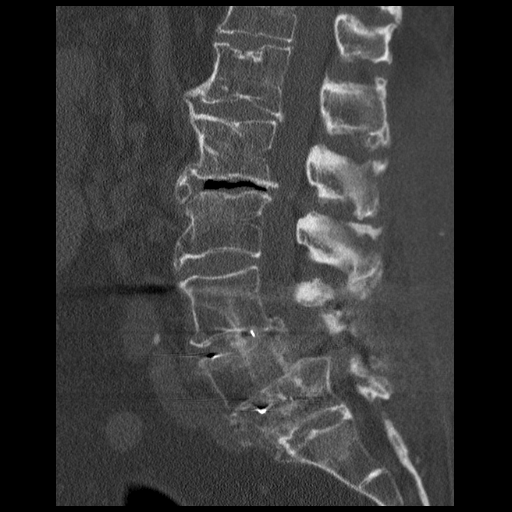
[im 29/50  bone]
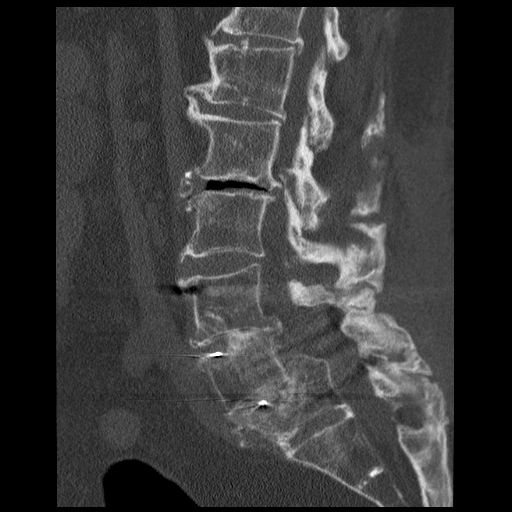
[im 33/50  bone]
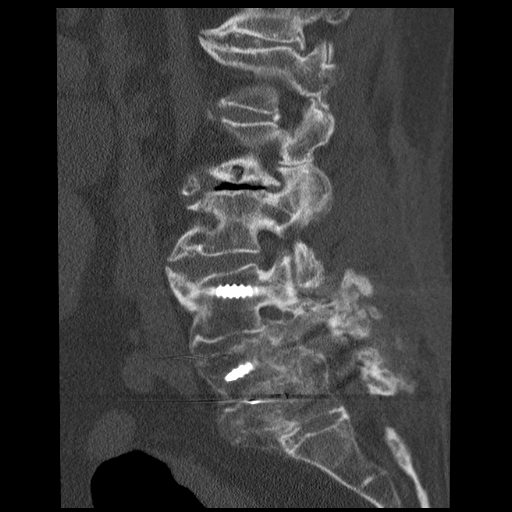

[Series 401: coronal · coronal · 0.42mm/px · 3 of 59 slices shown]
[im 12/59  bone]
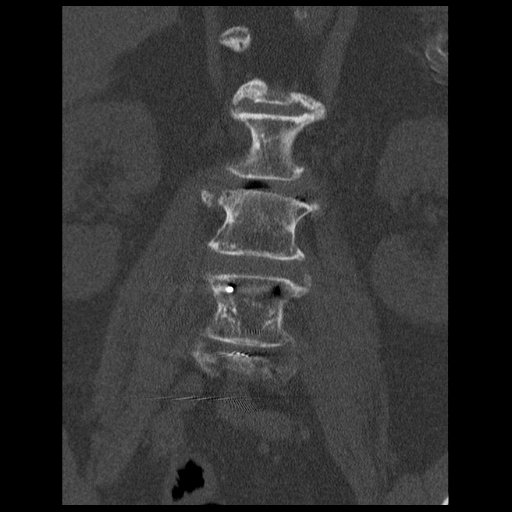
[im 24/59  bone]
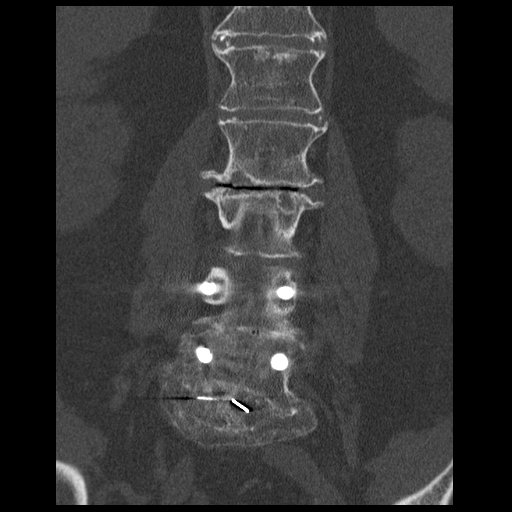
[im 35/59  bone]
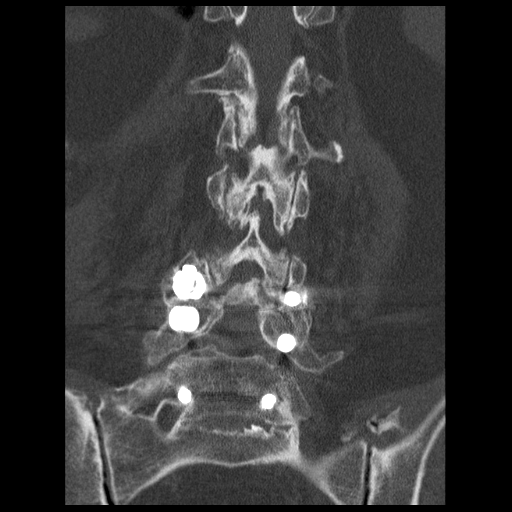

[13 of 33 positions shown; findings below may reference images not displayed]

FINDINGS: There is a mild levoconvex curve with the apex at L3.
Vacuum disc is present at L2-L3.  Fixed grade 2 anterolisthesis of
L5 on S1 is present with solid fusion from L4-S1. Posterior rod and
pedicle screw fixation extends from L4-S1 without complicating
features.  There is ossification of the S1-S2 disc and fused facet
joints bilaterally.  Vertebral body height is preserved except at
S1 which appears chronically deformed, probably associated with the
anterolisthesis and chronic degenerative remodeling. Paraspinal
soft tissues demonstrate aortic atherosclerosis.  Bilateral
sacroiliac joint degenerative disease with vacuum joint.

T12-L1:  Schmorl's node and circumferential calcified annular
bulging.  No stenosis.

L1-L2:  Disc degeneration with Schmorl's nodes.  Shallow broad-
based annular bulging without significant stenosis.

L2-L3: Severe disc degeneration.  Vacuum disc.  Ossification of the
anterior annulus.  Collapse of the disc space posteriorly with
large endplate osteophytes and calcified annulus.  Severe
multifactorial central stenosis. Right greater than left facet
hypertrophy.  Ligamentum flavum redundancy. Severe bilateral
foraminal stenosis with complete effacement of fat around the
exiting L2 nerves.

L3-L4:  Mild central stenosis.  Minimal loss of disc height.  Broad-
based disc bulging with annular calcification. Moderate right and
mild left facet arthrosis.

L4-L5:  Solid fusion with residual broad-based in place osteophytes
projecting into the central canal.  Bilateral laminotomies with
decompression.  Mild residual bilateral foraminal stenosis.

L5-S1:  Bilateral laminotomies with posterior decompression.  Mild
to moderate foraminal narrowing is present associated with the
solid fusion.

S1-S2 appears normal.
IMPRESSION: 1.  Uncomplicated solid fusion from L4-S1 with rod and pedicle
screw fixation.
2.  Severe at L2-L3 central, lateral recess and foraminal stenosis
associated with both disc and facet disease.
3.  No recent or acute appearing compression fracture in this
patient with history of fall.

## 2013-05-13 ENCOUNTER — Ambulatory Visit (INDEPENDENT_AMBULATORY_CARE_PROVIDER_SITE_OTHER): Payer: Medicare Other

## 2013-05-13 DIAGNOSIS — J309 Allergic rhinitis, unspecified: Secondary | ICD-10-CM | POA: Diagnosis not present

## 2013-05-13 DIAGNOSIS — M961 Postlaminectomy syndrome, not elsewhere classified: Secondary | ICD-10-CM | POA: Diagnosis not present

## 2013-05-13 DIAGNOSIS — M5137 Other intervertebral disc degeneration, lumbosacral region: Secondary | ICD-10-CM | POA: Diagnosis not present

## 2013-05-13 DIAGNOSIS — G894 Chronic pain syndrome: Secondary | ICD-10-CM | POA: Diagnosis not present

## 2013-05-13 DIAGNOSIS — M1990 Unspecified osteoarthritis, unspecified site: Secondary | ICD-10-CM | POA: Diagnosis not present

## 2013-05-25 DIAGNOSIS — E1149 Type 2 diabetes mellitus with other diabetic neurological complication: Secondary | ICD-10-CM | POA: Diagnosis not present

## 2013-05-25 DIAGNOSIS — I10 Essential (primary) hypertension: Secondary | ICD-10-CM | POA: Diagnosis not present

## 2013-05-25 DIAGNOSIS — M899 Disorder of bone, unspecified: Secondary | ICD-10-CM | POA: Diagnosis not present

## 2013-05-25 DIAGNOSIS — Z79899 Other long term (current) drug therapy: Secondary | ICD-10-CM | POA: Diagnosis not present

## 2013-05-25 DIAGNOSIS — I4891 Unspecified atrial fibrillation: Secondary | ICD-10-CM | POA: Diagnosis not present

## 2013-05-28 DIAGNOSIS — E1149 Type 2 diabetes mellitus with other diabetic neurological complication: Secondary | ICD-10-CM | POA: Diagnosis not present

## 2013-05-28 DIAGNOSIS — I10 Essential (primary) hypertension: Secondary | ICD-10-CM | POA: Diagnosis not present

## 2013-05-28 DIAGNOSIS — I4891 Unspecified atrial fibrillation: Secondary | ICD-10-CM | POA: Diagnosis not present

## 2013-05-28 DIAGNOSIS — Z79899 Other long term (current) drug therapy: Secondary | ICD-10-CM | POA: Diagnosis not present

## 2013-06-02 ENCOUNTER — Ambulatory Visit (INDEPENDENT_AMBULATORY_CARE_PROVIDER_SITE_OTHER): Payer: Medicare Other

## 2013-06-02 DIAGNOSIS — J309 Allergic rhinitis, unspecified: Secondary | ICD-10-CM | POA: Diagnosis not present

## 2013-06-17 ENCOUNTER — Ambulatory Visit (INDEPENDENT_AMBULATORY_CARE_PROVIDER_SITE_OTHER): Payer: Medicare Other

## 2013-06-17 DIAGNOSIS — J309 Allergic rhinitis, unspecified: Secondary | ICD-10-CM | POA: Diagnosis not present

## 2013-06-17 DIAGNOSIS — IMO0001 Reserved for inherently not codable concepts without codable children: Secondary | ICD-10-CM | POA: Diagnosis not present

## 2013-06-17 DIAGNOSIS — G894 Chronic pain syndrome: Secondary | ICD-10-CM | POA: Diagnosis not present

## 2013-06-17 DIAGNOSIS — Q762 Congenital spondylolisthesis: Secondary | ICD-10-CM | POA: Diagnosis not present

## 2013-06-17 DIAGNOSIS — M5137 Other intervertebral disc degeneration, lumbosacral region: Secondary | ICD-10-CM | POA: Diagnosis not present

## 2013-06-23 DIAGNOSIS — L0390 Cellulitis, unspecified: Secondary | ICD-10-CM | POA: Diagnosis not present

## 2013-06-23 DIAGNOSIS — L0291 Cutaneous abscess, unspecified: Secondary | ICD-10-CM | POA: Diagnosis not present

## 2013-06-30 DIAGNOSIS — E669 Obesity, unspecified: Secondary | ICD-10-CM | POA: Diagnosis not present

## 2013-06-30 DIAGNOSIS — I509 Heart failure, unspecified: Secondary | ICD-10-CM | POA: Diagnosis not present

## 2013-06-30 DIAGNOSIS — S81009A Unspecified open wound, unspecified knee, initial encounter: Secondary | ICD-10-CM | POA: Diagnosis not present

## 2013-06-30 DIAGNOSIS — S91009A Unspecified open wound, unspecified ankle, initial encounter: Secondary | ICD-10-CM | POA: Diagnosis not present

## 2013-06-30 DIAGNOSIS — E1149 Type 2 diabetes mellitus with other diabetic neurological complication: Secondary | ICD-10-CM | POA: Diagnosis not present

## 2013-06-30 DIAGNOSIS — L97209 Non-pressure chronic ulcer of unspecified calf with unspecified severity: Secondary | ICD-10-CM | POA: Diagnosis not present

## 2013-06-30 DIAGNOSIS — I4891 Unspecified atrial fibrillation: Secondary | ICD-10-CM | POA: Diagnosis not present

## 2013-06-30 DIAGNOSIS — K7460 Unspecified cirrhosis of liver: Secondary | ICD-10-CM | POA: Diagnosis not present

## 2013-06-30 DIAGNOSIS — K769 Liver disease, unspecified: Secondary | ICD-10-CM | POA: Diagnosis not present

## 2013-06-30 DIAGNOSIS — L97909 Non-pressure chronic ulcer of unspecified part of unspecified lower leg with unspecified severity: Secondary | ICD-10-CM | POA: Diagnosis not present

## 2013-06-30 DIAGNOSIS — M129 Arthropathy, unspecified: Secondary | ICD-10-CM | POA: Diagnosis not present

## 2013-06-30 DIAGNOSIS — IMO0001 Reserved for inherently not codable concepts without codable children: Secondary | ICD-10-CM | POA: Diagnosis not present

## 2013-06-30 DIAGNOSIS — I10 Essential (primary) hypertension: Secondary | ICD-10-CM | POA: Diagnosis not present

## 2013-06-30 DIAGNOSIS — I83009 Varicose veins of unspecified lower extremity with ulcer of unspecified site: Secondary | ICD-10-CM | POA: Diagnosis not present

## 2013-07-06 DIAGNOSIS — I83009 Varicose veins of unspecified lower extremity with ulcer of unspecified site: Secondary | ICD-10-CM | POA: Diagnosis not present

## 2013-07-06 DIAGNOSIS — I10 Essential (primary) hypertension: Secondary | ICD-10-CM | POA: Diagnosis not present

## 2013-07-06 DIAGNOSIS — I4891 Unspecified atrial fibrillation: Secondary | ICD-10-CM | POA: Diagnosis not present

## 2013-07-06 DIAGNOSIS — I509 Heart failure, unspecified: Secondary | ICD-10-CM | POA: Diagnosis not present

## 2013-07-06 DIAGNOSIS — E669 Obesity, unspecified: Secondary | ICD-10-CM | POA: Diagnosis not present

## 2013-07-06 DIAGNOSIS — L97209 Non-pressure chronic ulcer of unspecified calf with unspecified severity: Secondary | ICD-10-CM | POA: Diagnosis not present

## 2013-07-07 DIAGNOSIS — F339 Major depressive disorder, recurrent, unspecified: Secondary | ICD-10-CM | POA: Diagnosis not present

## 2013-07-08 ENCOUNTER — Ambulatory Visit (INDEPENDENT_AMBULATORY_CARE_PROVIDER_SITE_OTHER): Payer: Medicare Other

## 2013-07-08 DIAGNOSIS — J309 Allergic rhinitis, unspecified: Secondary | ICD-10-CM | POA: Diagnosis not present

## 2013-07-14 DIAGNOSIS — S81009A Unspecified open wound, unspecified knee, initial encounter: Secondary | ICD-10-CM | POA: Diagnosis not present

## 2013-07-14 DIAGNOSIS — I4891 Unspecified atrial fibrillation: Secondary | ICD-10-CM | POA: Diagnosis not present

## 2013-07-14 DIAGNOSIS — I509 Heart failure, unspecified: Secondary | ICD-10-CM | POA: Diagnosis not present

## 2013-07-14 DIAGNOSIS — I83009 Varicose veins of unspecified lower extremity with ulcer of unspecified site: Secondary | ICD-10-CM | POA: Diagnosis not present

## 2013-07-14 DIAGNOSIS — L97209 Non-pressure chronic ulcer of unspecified calf with unspecified severity: Secondary | ICD-10-CM | POA: Diagnosis not present

## 2013-07-14 DIAGNOSIS — E669 Obesity, unspecified: Secondary | ICD-10-CM | POA: Diagnosis not present

## 2013-07-14 DIAGNOSIS — I10 Essential (primary) hypertension: Secondary | ICD-10-CM | POA: Diagnosis not present

## 2013-07-14 DIAGNOSIS — E1149 Type 2 diabetes mellitus with other diabetic neurological complication: Secondary | ICD-10-CM | POA: Diagnosis not present

## 2013-07-15 ENCOUNTER — Ambulatory Visit (INDEPENDENT_AMBULATORY_CARE_PROVIDER_SITE_OTHER): Payer: Medicare Other

## 2013-07-15 DIAGNOSIS — J309 Allergic rhinitis, unspecified: Secondary | ICD-10-CM

## 2013-07-15 DIAGNOSIS — M5137 Other intervertebral disc degeneration, lumbosacral region: Secondary | ICD-10-CM | POA: Diagnosis not present

## 2013-07-15 DIAGNOSIS — Z79899 Other long term (current) drug therapy: Secondary | ICD-10-CM | POA: Diagnosis not present

## 2013-07-15 DIAGNOSIS — IMO0001 Reserved for inherently not codable concepts without codable children: Secondary | ICD-10-CM | POA: Diagnosis not present

## 2013-07-15 DIAGNOSIS — G894 Chronic pain syndrome: Secondary | ICD-10-CM | POA: Diagnosis not present

## 2013-07-16 ENCOUNTER — Ambulatory Visit: Payer: Medicare Other

## 2013-07-22 ENCOUNTER — Ambulatory Visit: Payer: Medicare Other

## 2013-07-22 DIAGNOSIS — I83009 Varicose veins of unspecified lower extremity with ulcer of unspecified site: Secondary | ICD-10-CM | POA: Diagnosis not present

## 2013-07-22 DIAGNOSIS — K769 Liver disease, unspecified: Secondary | ICD-10-CM | POA: Diagnosis not present

## 2013-07-22 DIAGNOSIS — I509 Heart failure, unspecified: Secondary | ICD-10-CM | POA: Diagnosis not present

## 2013-07-22 DIAGNOSIS — E669 Obesity, unspecified: Secondary | ICD-10-CM | POA: Diagnosis not present

## 2013-07-22 DIAGNOSIS — K7460 Unspecified cirrhosis of liver: Secondary | ICD-10-CM | POA: Diagnosis not present

## 2013-07-22 DIAGNOSIS — M129 Arthropathy, unspecified: Secondary | ICD-10-CM | POA: Diagnosis not present

## 2013-07-22 DIAGNOSIS — L97209 Non-pressure chronic ulcer of unspecified calf with unspecified severity: Secondary | ICD-10-CM | POA: Diagnosis not present

## 2013-07-22 DIAGNOSIS — IMO0001 Reserved for inherently not codable concepts without codable children: Secondary | ICD-10-CM | POA: Diagnosis not present

## 2013-07-22 DIAGNOSIS — I10 Essential (primary) hypertension: Secondary | ICD-10-CM | POA: Diagnosis not present

## 2013-07-22 DIAGNOSIS — I4891 Unspecified atrial fibrillation: Secondary | ICD-10-CM | POA: Diagnosis not present

## 2013-07-27 ENCOUNTER — Ambulatory Visit (INDEPENDENT_AMBULATORY_CARE_PROVIDER_SITE_OTHER): Payer: Medicare Other

## 2013-07-27 DIAGNOSIS — Z9181 History of falling: Secondary | ICD-10-CM | POA: Diagnosis not present

## 2013-07-27 DIAGNOSIS — E1149 Type 2 diabetes mellitus with other diabetic neurological complication: Secondary | ICD-10-CM | POA: Diagnosis not present

## 2013-07-27 DIAGNOSIS — Z23 Encounter for immunization: Secondary | ICD-10-CM | POA: Diagnosis not present

## 2013-07-27 DIAGNOSIS — J309 Allergic rhinitis, unspecified: Secondary | ICD-10-CM | POA: Diagnosis not present

## 2013-07-27 DIAGNOSIS — E1142 Type 2 diabetes mellitus with diabetic polyneuropathy: Secondary | ICD-10-CM | POA: Diagnosis not present

## 2013-07-28 DIAGNOSIS — L97209 Non-pressure chronic ulcer of unspecified calf with unspecified severity: Secondary | ICD-10-CM | POA: Diagnosis not present

## 2013-07-28 DIAGNOSIS — S81009A Unspecified open wound, unspecified knee, initial encounter: Secondary | ICD-10-CM | POA: Diagnosis not present

## 2013-07-28 DIAGNOSIS — I509 Heart failure, unspecified: Secondary | ICD-10-CM | POA: Diagnosis not present

## 2013-07-28 DIAGNOSIS — E1149 Type 2 diabetes mellitus with other diabetic neurological complication: Secondary | ICD-10-CM | POA: Diagnosis not present

## 2013-07-28 DIAGNOSIS — I83009 Varicose veins of unspecified lower extremity with ulcer of unspecified site: Secondary | ICD-10-CM | POA: Diagnosis not present

## 2013-07-28 DIAGNOSIS — I10 Essential (primary) hypertension: Secondary | ICD-10-CM | POA: Diagnosis not present

## 2013-07-28 DIAGNOSIS — I4891 Unspecified atrial fibrillation: Secondary | ICD-10-CM | POA: Diagnosis not present

## 2013-07-28 DIAGNOSIS — E669 Obesity, unspecified: Secondary | ICD-10-CM | POA: Diagnosis not present

## 2013-08-03 ENCOUNTER — Ambulatory Visit: Payer: Medicare Other

## 2013-08-04 DIAGNOSIS — S81009A Unspecified open wound, unspecified knee, initial encounter: Secondary | ICD-10-CM | POA: Diagnosis not present

## 2013-08-04 DIAGNOSIS — I4891 Unspecified atrial fibrillation: Secondary | ICD-10-CM | POA: Diagnosis not present

## 2013-08-04 DIAGNOSIS — L97209 Non-pressure chronic ulcer of unspecified calf with unspecified severity: Secondary | ICD-10-CM | POA: Diagnosis not present

## 2013-08-04 DIAGNOSIS — I509 Heart failure, unspecified: Secondary | ICD-10-CM | POA: Diagnosis not present

## 2013-08-04 DIAGNOSIS — I10 Essential (primary) hypertension: Secondary | ICD-10-CM | POA: Diagnosis not present

## 2013-08-04 DIAGNOSIS — E1149 Type 2 diabetes mellitus with other diabetic neurological complication: Secondary | ICD-10-CM | POA: Diagnosis not present

## 2013-08-04 DIAGNOSIS — I83009 Varicose veins of unspecified lower extremity with ulcer of unspecified site: Secondary | ICD-10-CM | POA: Diagnosis not present

## 2013-08-04 DIAGNOSIS — K7460 Unspecified cirrhosis of liver: Secondary | ICD-10-CM | POA: Diagnosis not present

## 2013-08-04 DIAGNOSIS — E669 Obesity, unspecified: Secondary | ICD-10-CM | POA: Diagnosis not present

## 2013-08-06 ENCOUNTER — Ambulatory Visit: Payer: Medicare Other | Attending: Internal Medicine

## 2013-08-06 DIAGNOSIS — R279 Unspecified lack of coordination: Secondary | ICD-10-CM | POA: Diagnosis not present

## 2013-08-06 DIAGNOSIS — R262 Difficulty in walking, not elsewhere classified: Secondary | ICD-10-CM | POA: Insufficient documentation

## 2013-08-06 DIAGNOSIS — IMO0001 Reserved for inherently not codable concepts without codable children: Secondary | ICD-10-CM | POA: Insufficient documentation

## 2013-08-06 DIAGNOSIS — Z9181 History of falling: Secondary | ICD-10-CM | POA: Diagnosis not present

## 2013-08-10 ENCOUNTER — Ambulatory Visit: Payer: Medicare Other

## 2013-08-11 DIAGNOSIS — E669 Obesity, unspecified: Secondary | ICD-10-CM | POA: Diagnosis not present

## 2013-08-11 DIAGNOSIS — I10 Essential (primary) hypertension: Secondary | ICD-10-CM | POA: Diagnosis not present

## 2013-08-11 DIAGNOSIS — I509 Heart failure, unspecified: Secondary | ICD-10-CM | POA: Diagnosis not present

## 2013-08-11 DIAGNOSIS — I83009 Varicose veins of unspecified lower extremity with ulcer of unspecified site: Secondary | ICD-10-CM | POA: Diagnosis not present

## 2013-08-11 DIAGNOSIS — L97209 Non-pressure chronic ulcer of unspecified calf with unspecified severity: Secondary | ICD-10-CM | POA: Diagnosis not present

## 2013-08-11 DIAGNOSIS — I4891 Unspecified atrial fibrillation: Secondary | ICD-10-CM | POA: Diagnosis not present

## 2013-08-12 ENCOUNTER — Ambulatory Visit (INDEPENDENT_AMBULATORY_CARE_PROVIDER_SITE_OTHER): Payer: Medicare Other

## 2013-08-12 ENCOUNTER — Ambulatory Visit: Payer: Medicare Other

## 2013-08-12 DIAGNOSIS — G894 Chronic pain syndrome: Secondary | ICD-10-CM | POA: Diagnosis not present

## 2013-08-12 DIAGNOSIS — M5137 Other intervertebral disc degeneration, lumbosacral region: Secondary | ICD-10-CM | POA: Diagnosis not present

## 2013-08-12 DIAGNOSIS — M461 Sacroiliitis, not elsewhere classified: Secondary | ICD-10-CM | POA: Diagnosis not present

## 2013-08-12 DIAGNOSIS — J309 Allergic rhinitis, unspecified: Secondary | ICD-10-CM

## 2013-08-12 DIAGNOSIS — M961 Postlaminectomy syndrome, not elsewhere classified: Secondary | ICD-10-CM | POA: Diagnosis not present

## 2013-08-17 ENCOUNTER — Ambulatory Visit: Payer: Medicare Other | Admitting: Physical Therapy

## 2013-08-18 DIAGNOSIS — I4891 Unspecified atrial fibrillation: Secondary | ICD-10-CM | POA: Diagnosis not present

## 2013-08-18 DIAGNOSIS — L97209 Non-pressure chronic ulcer of unspecified calf with unspecified severity: Secondary | ICD-10-CM | POA: Diagnosis not present

## 2013-08-18 DIAGNOSIS — I10 Essential (primary) hypertension: Secondary | ICD-10-CM | POA: Diagnosis not present

## 2013-08-18 DIAGNOSIS — I83009 Varicose veins of unspecified lower extremity with ulcer of unspecified site: Secondary | ICD-10-CM | POA: Diagnosis not present

## 2013-08-18 DIAGNOSIS — I83219 Varicose veins of right lower extremity with both ulcer of unspecified site and inflammation: Secondary | ICD-10-CM | POA: Diagnosis not present

## 2013-08-18 DIAGNOSIS — E669 Obesity, unspecified: Secondary | ICD-10-CM | POA: Diagnosis not present

## 2013-08-18 DIAGNOSIS — I872 Venous insufficiency (chronic) (peripheral): Secondary | ICD-10-CM | POA: Diagnosis not present

## 2013-08-18 DIAGNOSIS — I509 Heart failure, unspecified: Secondary | ICD-10-CM | POA: Diagnosis not present

## 2013-08-19 ENCOUNTER — Ambulatory Visit: Payer: Medicare Other

## 2013-08-19 ENCOUNTER — Ambulatory Visit (INDEPENDENT_AMBULATORY_CARE_PROVIDER_SITE_OTHER): Payer: Medicare Other

## 2013-08-19 DIAGNOSIS — J309 Allergic rhinitis, unspecified: Secondary | ICD-10-CM | POA: Diagnosis not present

## 2013-08-20 ENCOUNTER — Ambulatory Visit (INDEPENDENT_AMBULATORY_CARE_PROVIDER_SITE_OTHER): Payer: Medicare Other

## 2013-08-20 ENCOUNTER — Ambulatory Visit: Payer: Medicare Other | Attending: Internal Medicine

## 2013-08-20 DIAGNOSIS — R262 Difficulty in walking, not elsewhere classified: Secondary | ICD-10-CM | POA: Diagnosis not present

## 2013-08-20 DIAGNOSIS — Z9181 History of falling: Secondary | ICD-10-CM | POA: Insufficient documentation

## 2013-08-20 DIAGNOSIS — IMO0001 Reserved for inherently not codable concepts without codable children: Secondary | ICD-10-CM | POA: Insufficient documentation

## 2013-08-20 DIAGNOSIS — R279 Unspecified lack of coordination: Secondary | ICD-10-CM | POA: Insufficient documentation

## 2013-08-20 DIAGNOSIS — J309 Allergic rhinitis, unspecified: Secondary | ICD-10-CM

## 2013-08-24 ENCOUNTER — Ambulatory Visit: Payer: Medicare Other

## 2013-08-24 DIAGNOSIS — M899 Disorder of bone, unspecified: Secondary | ICD-10-CM | POA: Diagnosis not present

## 2013-08-24 DIAGNOSIS — I10 Essential (primary) hypertension: Secondary | ICD-10-CM | POA: Diagnosis not present

## 2013-08-24 DIAGNOSIS — Z0000 Encounter for general adult medical examination without abnormal findings: Secondary | ICD-10-CM | POA: Diagnosis not present

## 2013-08-24 DIAGNOSIS — Z79899 Other long term (current) drug therapy: Secondary | ICD-10-CM | POA: Diagnosis not present

## 2013-08-24 DIAGNOSIS — I4891 Unspecified atrial fibrillation: Secondary | ICD-10-CM | POA: Diagnosis not present

## 2013-08-24 DIAGNOSIS — E7800 Pure hypercholesterolemia, unspecified: Secondary | ICD-10-CM | POA: Diagnosis not present

## 2013-08-24 DIAGNOSIS — Z23 Encounter for immunization: Secondary | ICD-10-CM | POA: Diagnosis not present

## 2013-08-24 DIAGNOSIS — L570 Actinic keratosis: Secondary | ICD-10-CM | POA: Diagnosis not present

## 2013-08-24 DIAGNOSIS — E1149 Type 2 diabetes mellitus with other diabetic neurological complication: Secondary | ICD-10-CM | POA: Diagnosis not present

## 2013-08-25 DIAGNOSIS — E669 Obesity, unspecified: Secondary | ICD-10-CM | POA: Diagnosis not present

## 2013-08-25 DIAGNOSIS — L97209 Non-pressure chronic ulcer of unspecified calf with unspecified severity: Secondary | ICD-10-CM | POA: Diagnosis not present

## 2013-08-25 DIAGNOSIS — I872 Venous insufficiency (chronic) (peripheral): Secondary | ICD-10-CM | POA: Diagnosis not present

## 2013-08-25 DIAGNOSIS — I83009 Varicose veins of unspecified lower extremity with ulcer of unspecified site: Secondary | ICD-10-CM | POA: Diagnosis not present

## 2013-08-25 DIAGNOSIS — I509 Heart failure, unspecified: Secondary | ICD-10-CM | POA: Diagnosis not present

## 2013-08-25 DIAGNOSIS — IMO0001 Reserved for inherently not codable concepts without codable children: Secondary | ICD-10-CM | POA: Diagnosis not present

## 2013-08-25 DIAGNOSIS — I83219 Varicose veins of right lower extremity with both ulcer of unspecified site and inflammation: Secondary | ICD-10-CM | POA: Diagnosis not present

## 2013-08-25 DIAGNOSIS — K769 Liver disease, unspecified: Secondary | ICD-10-CM | POA: Diagnosis not present

## 2013-08-25 DIAGNOSIS — M129 Arthropathy, unspecified: Secondary | ICD-10-CM | POA: Diagnosis not present

## 2013-08-25 DIAGNOSIS — K7460 Unspecified cirrhosis of liver: Secondary | ICD-10-CM | POA: Diagnosis not present

## 2013-08-25 DIAGNOSIS — I10 Essential (primary) hypertension: Secondary | ICD-10-CM | POA: Diagnosis not present

## 2013-08-25 DIAGNOSIS — I4891 Unspecified atrial fibrillation: Secondary | ICD-10-CM | POA: Diagnosis not present

## 2013-08-26 DIAGNOSIS — Z86010 Personal history of colonic polyps: Secondary | ICD-10-CM | POA: Diagnosis not present

## 2013-08-26 DIAGNOSIS — K766 Portal hypertension: Secondary | ICD-10-CM | POA: Diagnosis not present

## 2013-08-26 DIAGNOSIS — D126 Benign neoplasm of colon, unspecified: Secondary | ICD-10-CM | POA: Diagnosis not present

## 2013-08-28 ENCOUNTER — Ambulatory Visit: Payer: Medicare Other

## 2013-08-28 DIAGNOSIS — I872 Venous insufficiency (chronic) (peripheral): Secondary | ICD-10-CM | POA: Diagnosis not present

## 2013-08-28 DIAGNOSIS — R609 Edema, unspecified: Secondary | ICD-10-CM | POA: Diagnosis not present

## 2013-08-31 DIAGNOSIS — Z1231 Encounter for screening mammogram for malignant neoplasm of breast: Secondary | ICD-10-CM | POA: Diagnosis not present

## 2013-09-01 ENCOUNTER — Ambulatory Visit (INDEPENDENT_AMBULATORY_CARE_PROVIDER_SITE_OTHER): Payer: Medicare Other

## 2013-09-01 DIAGNOSIS — J309 Allergic rhinitis, unspecified: Secondary | ICD-10-CM

## 2013-09-01 DIAGNOSIS — IMO0002 Reserved for concepts with insufficient information to code with codable children: Secondary | ICD-10-CM | POA: Diagnosis not present

## 2013-09-01 DIAGNOSIS — M159 Polyosteoarthritis, unspecified: Secondary | ICD-10-CM | POA: Diagnosis not present

## 2013-09-01 DIAGNOSIS — M310 Hypersensitivity angiitis: Secondary | ICD-10-CM | POA: Diagnosis not present

## 2013-09-01 DIAGNOSIS — IMO0001 Reserved for inherently not codable concepts without codable children: Secondary | ICD-10-CM | POA: Diagnosis not present

## 2013-09-02 ENCOUNTER — Ambulatory Visit: Payer: Medicare Other

## 2013-09-04 ENCOUNTER — Ambulatory Visit: Payer: Medicare Other

## 2013-09-07 ENCOUNTER — Ambulatory Visit: Payer: Medicare Other

## 2013-09-08 ENCOUNTER — Ambulatory Visit (INDEPENDENT_AMBULATORY_CARE_PROVIDER_SITE_OTHER): Payer: Medicare Other

## 2013-09-08 DIAGNOSIS — J309 Allergic rhinitis, unspecified: Secondary | ICD-10-CM | POA: Diagnosis not present

## 2013-09-08 DIAGNOSIS — M79609 Pain in unspecified limb: Secondary | ICD-10-CM | POA: Diagnosis not present

## 2013-09-08 DIAGNOSIS — G894 Chronic pain syndrome: Secondary | ICD-10-CM | POA: Diagnosis not present

## 2013-09-08 DIAGNOSIS — M961 Postlaminectomy syndrome, not elsewhere classified: Secondary | ICD-10-CM | POA: Diagnosis not present

## 2013-09-09 DIAGNOSIS — G894 Chronic pain syndrome: Secondary | ICD-10-CM | POA: Diagnosis not present

## 2013-09-09 DIAGNOSIS — M5137 Other intervertebral disc degeneration, lumbosacral region: Secondary | ICD-10-CM | POA: Diagnosis not present

## 2013-09-09 DIAGNOSIS — G609 Hereditary and idiopathic neuropathy, unspecified: Secondary | ICD-10-CM | POA: Diagnosis not present

## 2013-09-09 DIAGNOSIS — IMO0001 Reserved for inherently not codable concepts without codable children: Secondary | ICD-10-CM | POA: Diagnosis not present

## 2013-09-11 ENCOUNTER — Ambulatory Visit: Payer: Medicare Other

## 2013-09-14 ENCOUNTER — Ambulatory Visit: Payer: Medicare Other

## 2013-09-15 DIAGNOSIS — K7460 Unspecified cirrhosis of liver: Secondary | ICD-10-CM | POA: Diagnosis not present

## 2013-09-15 DIAGNOSIS — R161 Splenomegaly, not elsewhere classified: Secondary | ICD-10-CM | POA: Diagnosis not present

## 2013-09-16 ENCOUNTER — Ambulatory Visit: Payer: Medicare Other | Admitting: Physical Therapy

## 2013-09-16 DIAGNOSIS — R609 Edema, unspecified: Secondary | ICD-10-CM | POA: Diagnosis not present

## 2013-09-16 DIAGNOSIS — I4891 Unspecified atrial fibrillation: Secondary | ICD-10-CM | POA: Diagnosis not present

## 2013-09-16 DIAGNOSIS — I2510 Atherosclerotic heart disease of native coronary artery without angina pectoris: Secondary | ICD-10-CM | POA: Diagnosis not present

## 2013-09-16 DIAGNOSIS — E119 Type 2 diabetes mellitus without complications: Secondary | ICD-10-CM | POA: Diagnosis not present

## 2013-09-16 DIAGNOSIS — E785 Hyperlipidemia, unspecified: Secondary | ICD-10-CM | POA: Diagnosis not present

## 2013-09-16 DIAGNOSIS — I10 Essential (primary) hypertension: Secondary | ICD-10-CM | POA: Diagnosis not present

## 2013-09-21 ENCOUNTER — Ambulatory Visit (INDEPENDENT_AMBULATORY_CARE_PROVIDER_SITE_OTHER): Payer: Medicare Other

## 2013-09-21 ENCOUNTER — Ambulatory Visit: Payer: Medicare Other | Attending: Internal Medicine

## 2013-09-21 DIAGNOSIS — R279 Unspecified lack of coordination: Secondary | ICD-10-CM | POA: Diagnosis not present

## 2013-09-21 DIAGNOSIS — R262 Difficulty in walking, not elsewhere classified: Secondary | ICD-10-CM | POA: Insufficient documentation

## 2013-09-21 DIAGNOSIS — J309 Allergic rhinitis, unspecified: Secondary | ICD-10-CM | POA: Diagnosis not present

## 2013-09-21 DIAGNOSIS — IMO0001 Reserved for inherently not codable concepts without codable children: Secondary | ICD-10-CM | POA: Insufficient documentation

## 2013-09-21 DIAGNOSIS — Z9181 History of falling: Secondary | ICD-10-CM | POA: Insufficient documentation

## 2013-09-22 ENCOUNTER — Ambulatory Visit (INDEPENDENT_AMBULATORY_CARE_PROVIDER_SITE_OTHER): Payer: Medicare Other

## 2013-09-22 DIAGNOSIS — J309 Allergic rhinitis, unspecified: Secondary | ICD-10-CM | POA: Diagnosis not present

## 2013-09-24 ENCOUNTER — Ambulatory Visit: Payer: Medicare Other | Admitting: Physical Therapy

## 2013-09-24 DIAGNOSIS — R279 Unspecified lack of coordination: Secondary | ICD-10-CM | POA: Diagnosis not present

## 2013-09-24 DIAGNOSIS — Z9181 History of falling: Secondary | ICD-10-CM | POA: Diagnosis not present

## 2013-09-24 DIAGNOSIS — IMO0001 Reserved for inherently not codable concepts without codable children: Secondary | ICD-10-CM | POA: Diagnosis not present

## 2013-09-24 DIAGNOSIS — R262 Difficulty in walking, not elsewhere classified: Secondary | ICD-10-CM | POA: Diagnosis not present

## 2013-09-28 ENCOUNTER — Ambulatory Visit: Payer: Medicare Other

## 2013-09-29 ENCOUNTER — Ambulatory Visit: Payer: Medicare Other | Admitting: Physical Therapy

## 2013-09-29 DIAGNOSIS — F4542 Pain disorder with related psychological factors: Secondary | ICD-10-CM | POA: Diagnosis not present

## 2013-09-29 DIAGNOSIS — IMO0001 Reserved for inherently not codable concepts without codable children: Secondary | ICD-10-CM | POA: Diagnosis not present

## 2013-09-29 DIAGNOSIS — R262 Difficulty in walking, not elsewhere classified: Secondary | ICD-10-CM | POA: Diagnosis not present

## 2013-09-29 DIAGNOSIS — F339 Major depressive disorder, recurrent, unspecified: Secondary | ICD-10-CM | POA: Diagnosis not present

## 2013-09-29 DIAGNOSIS — R279 Unspecified lack of coordination: Secondary | ICD-10-CM | POA: Diagnosis not present

## 2013-09-29 DIAGNOSIS — Z9181 History of falling: Secondary | ICD-10-CM | POA: Diagnosis not present

## 2013-09-29 DIAGNOSIS — F411 Generalized anxiety disorder: Secondary | ICD-10-CM | POA: Diagnosis not present

## 2013-10-01 ENCOUNTER — Ambulatory Visit: Payer: Medicare Other

## 2013-10-01 ENCOUNTER — Ambulatory Visit (INDEPENDENT_AMBULATORY_CARE_PROVIDER_SITE_OTHER): Payer: Medicare Other

## 2013-10-01 DIAGNOSIS — R609 Edema, unspecified: Secondary | ICD-10-CM | POA: Diagnosis not present

## 2013-10-01 DIAGNOSIS — J309 Allergic rhinitis, unspecified: Secondary | ICD-10-CM

## 2013-10-01 DIAGNOSIS — R279 Unspecified lack of coordination: Secondary | ICD-10-CM | POA: Diagnosis not present

## 2013-10-01 DIAGNOSIS — R262 Difficulty in walking, not elsewhere classified: Secondary | ICD-10-CM | POA: Diagnosis not present

## 2013-10-01 DIAGNOSIS — Z9181 History of falling: Secondary | ICD-10-CM | POA: Diagnosis not present

## 2013-10-01 DIAGNOSIS — IMO0001 Reserved for inherently not codable concepts without codable children: Secondary | ICD-10-CM | POA: Diagnosis not present

## 2013-10-01 DIAGNOSIS — I4891 Unspecified atrial fibrillation: Secondary | ICD-10-CM | POA: Diagnosis not present

## 2013-10-04 DIAGNOSIS — R918 Other nonspecific abnormal finding of lung field: Secondary | ICD-10-CM | POA: Diagnosis not present

## 2013-10-04 DIAGNOSIS — R4182 Altered mental status, unspecified: Secondary | ICD-10-CM | POA: Diagnosis not present

## 2013-10-04 DIAGNOSIS — N390 Urinary tract infection, site not specified: Secondary | ICD-10-CM | POA: Diagnosis not present

## 2013-10-04 DIAGNOSIS — F29 Unspecified psychosis not due to a substance or known physiological condition: Secondary | ICD-10-CM | POA: Diagnosis not present

## 2013-10-04 DIAGNOSIS — R509 Fever, unspecified: Secondary | ICD-10-CM | POA: Diagnosis not present

## 2013-10-04 DIAGNOSIS — R404 Transient alteration of awareness: Secondary | ICD-10-CM | POA: Diagnosis not present

## 2013-10-04 DIAGNOSIS — R9431 Abnormal electrocardiogram [ECG] [EKG]: Secondary | ICD-10-CM | POA: Diagnosis not present

## 2013-10-05 DIAGNOSIS — I2510 Atherosclerotic heart disease of native coronary artery without angina pectoris: Secondary | ICD-10-CM | POA: Diagnosis not present

## 2013-10-05 DIAGNOSIS — E871 Hypo-osmolality and hyponatremia: Secondary | ICD-10-CM | POA: Diagnosis not present

## 2013-10-05 DIAGNOSIS — I4891 Unspecified atrial fibrillation: Secondary | ICD-10-CM | POA: Diagnosis not present

## 2013-10-05 DIAGNOSIS — F29 Unspecified psychosis not due to a substance or known physiological condition: Secondary | ICD-10-CM | POA: Diagnosis not present

## 2013-10-05 DIAGNOSIS — I252 Old myocardial infarction: Secondary | ICD-10-CM | POA: Diagnosis not present

## 2013-10-05 DIAGNOSIS — K219 Gastro-esophageal reflux disease without esophagitis: Secondary | ICD-10-CM | POA: Diagnosis present

## 2013-10-05 DIAGNOSIS — G929 Unspecified toxic encephalopathy: Secondary | ICD-10-CM | POA: Diagnosis not present

## 2013-10-05 DIAGNOSIS — G92 Toxic encephalopathy: Secondary | ICD-10-CM | POA: Diagnosis not present

## 2013-10-05 DIAGNOSIS — Z79899 Other long term (current) drug therapy: Secondary | ICD-10-CM | POA: Diagnosis not present

## 2013-10-05 DIAGNOSIS — R404 Transient alteration of awareness: Secondary | ICD-10-CM | POA: Diagnosis not present

## 2013-10-05 DIAGNOSIS — R918 Other nonspecific abnormal finding of lung field: Secondary | ICD-10-CM | POA: Diagnosis not present

## 2013-10-05 DIAGNOSIS — E119 Type 2 diabetes mellitus without complications: Secondary | ICD-10-CM | POA: Diagnosis present

## 2013-10-05 DIAGNOSIS — F0390 Unspecified dementia without behavioral disturbance: Secondary | ICD-10-CM | POA: Diagnosis present

## 2013-10-05 DIAGNOSIS — I10 Essential (primary) hypertension: Secondary | ICD-10-CM | POA: Diagnosis not present

## 2013-10-05 DIAGNOSIS — R4182 Altered mental status, unspecified: Secondary | ICD-10-CM | POA: Diagnosis not present

## 2013-10-05 DIAGNOSIS — N390 Urinary tract infection, site not specified: Secondary | ICD-10-CM | POA: Diagnosis not present

## 2013-10-05 DIAGNOSIS — R509 Fever, unspecified: Secondary | ICD-10-CM | POA: Diagnosis not present

## 2013-10-05 DIAGNOSIS — A4151 Sepsis due to Escherichia coli [E. coli]: Secondary | ICD-10-CM | POA: Diagnosis not present

## 2013-10-05 DIAGNOSIS — I4892 Unspecified atrial flutter: Secondary | ICD-10-CM | POA: Diagnosis present

## 2013-10-05 DIAGNOSIS — E876 Hypokalemia: Secondary | ICD-10-CM | POA: Diagnosis not present

## 2013-10-05 DIAGNOSIS — R9431 Abnormal electrocardiogram [ECG] [EKG]: Secondary | ICD-10-CM | POA: Diagnosis not present

## 2013-10-05 DIAGNOSIS — A419 Sepsis, unspecified organism: Secondary | ICD-10-CM | POA: Diagnosis present

## 2013-10-06 ENCOUNTER — Ambulatory Visit: Payer: Medicare Other

## 2013-10-08 ENCOUNTER — Ambulatory Visit: Payer: Self-pay

## 2013-10-09 ENCOUNTER — Ambulatory Visit: Payer: Medicare Other | Admitting: Physical Therapy

## 2013-10-11 DIAGNOSIS — I509 Heart failure, unspecified: Secondary | ICD-10-CM | POA: Diagnosis not present

## 2013-10-11 DIAGNOSIS — E119 Type 2 diabetes mellitus without complications: Secondary | ICD-10-CM | POA: Diagnosis not present

## 2013-10-11 DIAGNOSIS — G4733 Obstructive sleep apnea (adult) (pediatric): Secondary | ICD-10-CM | POA: Diagnosis not present

## 2013-10-11 DIAGNOSIS — N390 Urinary tract infection, site not specified: Secondary | ICD-10-CM | POA: Diagnosis not present

## 2013-10-11 DIAGNOSIS — I10 Essential (primary) hypertension: Secondary | ICD-10-CM | POA: Diagnosis not present

## 2013-10-12 DIAGNOSIS — G4733 Obstructive sleep apnea (adult) (pediatric): Secondary | ICD-10-CM | POA: Diagnosis not present

## 2013-10-12 DIAGNOSIS — I509 Heart failure, unspecified: Secondary | ICD-10-CM | POA: Diagnosis not present

## 2013-10-12 DIAGNOSIS — E119 Type 2 diabetes mellitus without complications: Secondary | ICD-10-CM | POA: Diagnosis not present

## 2013-10-12 DIAGNOSIS — N390 Urinary tract infection, site not specified: Secondary | ICD-10-CM | POA: Diagnosis not present

## 2013-10-12 DIAGNOSIS — I10 Essential (primary) hypertension: Secondary | ICD-10-CM | POA: Diagnosis not present

## 2013-10-13 ENCOUNTER — Ambulatory Visit: Payer: Medicare Other

## 2013-10-13 DIAGNOSIS — I509 Heart failure, unspecified: Secondary | ICD-10-CM | POA: Diagnosis not present

## 2013-10-13 DIAGNOSIS — N390 Urinary tract infection, site not specified: Secondary | ICD-10-CM | POA: Diagnosis not present

## 2013-10-13 DIAGNOSIS — G4733 Obstructive sleep apnea (adult) (pediatric): Secondary | ICD-10-CM | POA: Diagnosis not present

## 2013-10-13 DIAGNOSIS — I10 Essential (primary) hypertension: Secondary | ICD-10-CM | POA: Diagnosis not present

## 2013-10-13 DIAGNOSIS — E119 Type 2 diabetes mellitus without complications: Secondary | ICD-10-CM | POA: Diagnosis not present

## 2013-10-14 DIAGNOSIS — I509 Heart failure, unspecified: Secondary | ICD-10-CM | POA: Diagnosis not present

## 2013-10-14 DIAGNOSIS — N390 Urinary tract infection, site not specified: Secondary | ICD-10-CM | POA: Diagnosis not present

## 2013-10-14 DIAGNOSIS — E119 Type 2 diabetes mellitus without complications: Secondary | ICD-10-CM | POA: Diagnosis not present

## 2013-10-14 DIAGNOSIS — G4733 Obstructive sleep apnea (adult) (pediatric): Secondary | ICD-10-CM | POA: Diagnosis not present

## 2013-10-14 DIAGNOSIS — I10 Essential (primary) hypertension: Secondary | ICD-10-CM | POA: Diagnosis not present

## 2013-10-16 DIAGNOSIS — N390 Urinary tract infection, site not specified: Secondary | ICD-10-CM | POA: Diagnosis not present

## 2013-10-16 DIAGNOSIS — E119 Type 2 diabetes mellitus without complications: Secondary | ICD-10-CM | POA: Diagnosis not present

## 2013-10-16 DIAGNOSIS — I10 Essential (primary) hypertension: Secondary | ICD-10-CM | POA: Diagnosis not present

## 2013-10-16 DIAGNOSIS — I509 Heart failure, unspecified: Secondary | ICD-10-CM | POA: Diagnosis not present

## 2013-10-16 DIAGNOSIS — G4733 Obstructive sleep apnea (adult) (pediatric): Secondary | ICD-10-CM | POA: Diagnosis not present

## 2013-10-20 DIAGNOSIS — I509 Heart failure, unspecified: Secondary | ICD-10-CM | POA: Diagnosis not present

## 2013-10-20 DIAGNOSIS — G4733 Obstructive sleep apnea (adult) (pediatric): Secondary | ICD-10-CM | POA: Diagnosis not present

## 2013-10-20 DIAGNOSIS — N390 Urinary tract infection, site not specified: Secondary | ICD-10-CM | POA: Diagnosis not present

## 2013-10-20 DIAGNOSIS — E119 Type 2 diabetes mellitus without complications: Secondary | ICD-10-CM | POA: Diagnosis not present

## 2013-10-20 DIAGNOSIS — I10 Essential (primary) hypertension: Secondary | ICD-10-CM | POA: Diagnosis not present

## 2013-10-21 ENCOUNTER — Encounter: Payer: Medicare Other | Admitting: Physical Therapy

## 2013-10-22 DIAGNOSIS — R5381 Other malaise: Secondary | ICD-10-CM | POA: Diagnosis not present

## 2013-10-22 DIAGNOSIS — J449 Chronic obstructive pulmonary disease, unspecified: Secondary | ICD-10-CM | POA: Diagnosis not present

## 2013-10-22 DIAGNOSIS — R059 Cough, unspecified: Secondary | ICD-10-CM | POA: Diagnosis not present

## 2013-10-26 ENCOUNTER — Encounter: Payer: Medicare Other | Admitting: Physical Therapy

## 2013-10-26 DIAGNOSIS — G4733 Obstructive sleep apnea (adult) (pediatric): Secondary | ICD-10-CM | POA: Diagnosis not present

## 2013-10-26 DIAGNOSIS — N390 Urinary tract infection, site not specified: Secondary | ICD-10-CM | POA: Diagnosis not present

## 2013-10-26 DIAGNOSIS — I509 Heart failure, unspecified: Secondary | ICD-10-CM | POA: Diagnosis not present

## 2013-10-26 DIAGNOSIS — E119 Type 2 diabetes mellitus without complications: Secondary | ICD-10-CM | POA: Diagnosis not present

## 2013-10-26 DIAGNOSIS — I10 Essential (primary) hypertension: Secondary | ICD-10-CM | POA: Diagnosis not present

## 2013-10-27 ENCOUNTER — Ambulatory Visit (INDEPENDENT_AMBULATORY_CARE_PROVIDER_SITE_OTHER): Payer: Medicare Other

## 2013-10-27 DIAGNOSIS — J309 Allergic rhinitis, unspecified: Secondary | ICD-10-CM

## 2013-10-27 DIAGNOSIS — E1149 Type 2 diabetes mellitus with other diabetic neurological complication: Secondary | ICD-10-CM | POA: Diagnosis not present

## 2013-10-27 DIAGNOSIS — E669 Obesity, unspecified: Secondary | ICD-10-CM | POA: Diagnosis not present

## 2013-10-27 DIAGNOSIS — E1142 Type 2 diabetes mellitus with diabetic polyneuropathy: Secondary | ICD-10-CM | POA: Diagnosis not present

## 2013-10-29 DIAGNOSIS — G4733 Obstructive sleep apnea (adult) (pediatric): Secondary | ICD-10-CM | POA: Diagnosis not present

## 2013-10-29 DIAGNOSIS — N390 Urinary tract infection, site not specified: Secondary | ICD-10-CM | POA: Diagnosis not present

## 2013-10-29 DIAGNOSIS — I509 Heart failure, unspecified: Secondary | ICD-10-CM | POA: Diagnosis not present

## 2013-10-29 DIAGNOSIS — I10 Essential (primary) hypertension: Secondary | ICD-10-CM | POA: Diagnosis not present

## 2013-10-29 DIAGNOSIS — E119 Type 2 diabetes mellitus without complications: Secondary | ICD-10-CM | POA: Diagnosis not present

## 2013-10-30 DIAGNOSIS — N390 Urinary tract infection, site not specified: Secondary | ICD-10-CM | POA: Diagnosis not present

## 2013-10-30 DIAGNOSIS — E119 Type 2 diabetes mellitus without complications: Secondary | ICD-10-CM | POA: Diagnosis not present

## 2013-10-30 DIAGNOSIS — I10 Essential (primary) hypertension: Secondary | ICD-10-CM | POA: Diagnosis not present

## 2013-10-30 DIAGNOSIS — I509 Heart failure, unspecified: Secondary | ICD-10-CM | POA: Diagnosis not present

## 2013-10-30 DIAGNOSIS — G4733 Obstructive sleep apnea (adult) (pediatric): Secondary | ICD-10-CM | POA: Diagnosis not present

## 2013-11-02 DIAGNOSIS — N390 Urinary tract infection, site not specified: Secondary | ICD-10-CM | POA: Diagnosis not present

## 2013-11-02 DIAGNOSIS — I10 Essential (primary) hypertension: Secondary | ICD-10-CM | POA: Diagnosis not present

## 2013-11-02 DIAGNOSIS — G4733 Obstructive sleep apnea (adult) (pediatric): Secondary | ICD-10-CM | POA: Diagnosis not present

## 2013-11-02 DIAGNOSIS — I509 Heart failure, unspecified: Secondary | ICD-10-CM | POA: Diagnosis not present

## 2013-11-02 DIAGNOSIS — E119 Type 2 diabetes mellitus without complications: Secondary | ICD-10-CM | POA: Diagnosis not present

## 2013-11-03 ENCOUNTER — Ambulatory Visit (INDEPENDENT_AMBULATORY_CARE_PROVIDER_SITE_OTHER): Payer: Medicare Other

## 2013-11-03 DIAGNOSIS — J309 Allergic rhinitis, unspecified: Secondary | ICD-10-CM | POA: Diagnosis not present

## 2013-11-04 DIAGNOSIS — G4733 Obstructive sleep apnea (adult) (pediatric): Secondary | ICD-10-CM | POA: Diagnosis not present

## 2013-11-04 DIAGNOSIS — I10 Essential (primary) hypertension: Secondary | ICD-10-CM | POA: Diagnosis not present

## 2013-11-04 DIAGNOSIS — I509 Heart failure, unspecified: Secondary | ICD-10-CM | POA: Diagnosis not present

## 2013-11-04 DIAGNOSIS — E119 Type 2 diabetes mellitus without complications: Secondary | ICD-10-CM | POA: Diagnosis not present

## 2013-11-04 DIAGNOSIS — N390 Urinary tract infection, site not specified: Secondary | ICD-10-CM | POA: Diagnosis not present

## 2013-11-05 DIAGNOSIS — I10 Essential (primary) hypertension: Secondary | ICD-10-CM | POA: Diagnosis not present

## 2013-11-05 DIAGNOSIS — G4733 Obstructive sleep apnea (adult) (pediatric): Secondary | ICD-10-CM | POA: Diagnosis not present

## 2013-11-05 DIAGNOSIS — I509 Heart failure, unspecified: Secondary | ICD-10-CM | POA: Diagnosis not present

## 2013-11-05 DIAGNOSIS — E119 Type 2 diabetes mellitus without complications: Secondary | ICD-10-CM | POA: Diagnosis not present

## 2013-11-05 DIAGNOSIS — N390 Urinary tract infection, site not specified: Secondary | ICD-10-CM | POA: Diagnosis not present

## 2013-11-13 DIAGNOSIS — E119 Type 2 diabetes mellitus without complications: Secondary | ICD-10-CM | POA: Diagnosis not present

## 2013-11-13 DIAGNOSIS — N390 Urinary tract infection, site not specified: Secondary | ICD-10-CM | POA: Diagnosis not present

## 2013-11-13 DIAGNOSIS — I10 Essential (primary) hypertension: Secondary | ICD-10-CM | POA: Diagnosis not present

## 2013-11-13 DIAGNOSIS — G4733 Obstructive sleep apnea (adult) (pediatric): Secondary | ICD-10-CM | POA: Diagnosis not present

## 2013-11-13 DIAGNOSIS — I509 Heart failure, unspecified: Secondary | ICD-10-CM | POA: Diagnosis not present

## 2013-11-17 DIAGNOSIS — E876 Hypokalemia: Secondary | ICD-10-CM | POA: Diagnosis not present

## 2013-11-17 DIAGNOSIS — Z79899 Other long term (current) drug therapy: Secondary | ICD-10-CM | POA: Diagnosis not present

## 2013-11-17 DIAGNOSIS — N3000 Acute cystitis without hematuria: Secondary | ICD-10-CM | POA: Diagnosis not present

## 2013-11-17 DIAGNOSIS — F339 Major depressive disorder, recurrent, unspecified: Secondary | ICD-10-CM | POA: Diagnosis not present

## 2013-11-17 DIAGNOSIS — I10 Essential (primary) hypertension: Secondary | ICD-10-CM | POA: Diagnosis not present

## 2013-11-18 DIAGNOSIS — I10 Essential (primary) hypertension: Secondary | ICD-10-CM | POA: Diagnosis not present

## 2013-11-18 DIAGNOSIS — E119 Type 2 diabetes mellitus without complications: Secondary | ICD-10-CM | POA: Diagnosis not present

## 2013-11-18 DIAGNOSIS — N390 Urinary tract infection, site not specified: Secondary | ICD-10-CM | POA: Diagnosis not present

## 2013-11-18 DIAGNOSIS — I509 Heart failure, unspecified: Secondary | ICD-10-CM | POA: Diagnosis not present

## 2013-11-18 DIAGNOSIS — G4733 Obstructive sleep apnea (adult) (pediatric): Secondary | ICD-10-CM | POA: Diagnosis not present

## 2013-12-22 ENCOUNTER — Ambulatory Visit (INDEPENDENT_AMBULATORY_CARE_PROVIDER_SITE_OTHER): Payer: Medicare Other

## 2013-12-22 DIAGNOSIS — J309 Allergic rhinitis, unspecified: Secondary | ICD-10-CM | POA: Diagnosis not present

## 2013-12-29 ENCOUNTER — Ambulatory Visit: Payer: Medicare Other

## 2013-12-30 ENCOUNTER — Ambulatory Visit (INDEPENDENT_AMBULATORY_CARE_PROVIDER_SITE_OTHER): Payer: Medicare Other

## 2013-12-30 DIAGNOSIS — J309 Allergic rhinitis, unspecified: Secondary | ICD-10-CM

## 2014-01-06 DIAGNOSIS — E11329 Type 2 diabetes mellitus with mild nonproliferative diabetic retinopathy without macular edema: Secondary | ICD-10-CM | POA: Diagnosis not present

## 2014-01-06 DIAGNOSIS — E1139 Type 2 diabetes mellitus with other diabetic ophthalmic complication: Secondary | ICD-10-CM | POA: Diagnosis not present

## 2014-01-13 ENCOUNTER — Ambulatory Visit (INDEPENDENT_AMBULATORY_CARE_PROVIDER_SITE_OTHER): Payer: Medicare Other

## 2014-01-13 DIAGNOSIS — J309 Allergic rhinitis, unspecified: Secondary | ICD-10-CM | POA: Diagnosis not present

## 2014-01-21 ENCOUNTER — Encounter (INDEPENDENT_AMBULATORY_CARE_PROVIDER_SITE_OTHER): Payer: Self-pay

## 2014-01-21 ENCOUNTER — Ambulatory Visit: Payer: Medicare Other | Admitting: Internal Medicine

## 2014-01-21 ENCOUNTER — Encounter: Payer: Self-pay | Admitting: Internal Medicine

## 2014-01-21 ENCOUNTER — Ambulatory Visit (INDEPENDENT_AMBULATORY_CARE_PROVIDER_SITE_OTHER): Payer: Medicare Other | Admitting: Internal Medicine

## 2014-01-21 ENCOUNTER — Ambulatory Visit (INDEPENDENT_AMBULATORY_CARE_PROVIDER_SITE_OTHER): Payer: Medicare Other

## 2014-01-21 VITALS — BP 124/66 | HR 52 | Ht 65.0 in | Wt 234.8 lb

## 2014-01-21 DIAGNOSIS — J301 Allergic rhinitis due to pollen: Secondary | ICD-10-CM | POA: Diagnosis not present

## 2014-01-21 DIAGNOSIS — G4733 Obstructive sleep apnea (adult) (pediatric): Secondary | ICD-10-CM | POA: Diagnosis not present

## 2014-01-21 DIAGNOSIS — J309 Allergic rhinitis, unspecified: Secondary | ICD-10-CM

## 2014-01-21 NOTE — Progress Notes (Signed)
10/19/12- 12 yoF never smoker, followed for OSA, chronic bronchitis LOV- 09/20/10 Since last here she had lumbar spine surgery and was at a nursing facility for rehabilitation. Back pain limits walking but she is doing better, using a cane or a walker now. Notices a persistent loose cough with clear sputum. At times she may cough until she retches. She does not feel significant postnasal drainage or reflux. Continues CPAP [facility] all night every night "I have to have it". She resumed allergy shots here and says they do help. 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  01/21/13- 66 yoF never smoker, followed for OSA, chronic bronchitis, allergic rhinitis FOLLOWS FUX:NATFT on vaccine and doing well; wears CPAP every night for about 7-9 hours; pressure working well for patient as well. Had back surgery in March of 2013 , left with foot drop, so she wears ankle braces now. Continues CPAP [facility] is still says she "has to have it". Uses CPAP all night every night. Continues Flonase. Continues allergy vaccine 1:5000 GH. She had had to drop off for a while when she was at nursing home for rehabilitation. She is now rebuilding without problems.  01/21/14- 67 yoF never smoker, followed for OSA, chronic bronchitis, allergic rhinitis FOLLOWS DDU:KGURK CPAP 9/ [facility] every night for about 7-9 hours; pressure seems to be too low. Continues allergy vaccine and doing well. Allergy vaccine 1:500 GH - misses frequently due to transportation. She says she can do better now and knows that the shots work for her so she does not want to stop. Back pain, hurts to walk, asks handicapped parking She has one CPAP machine on [employer] and another set on 9, which is the one she is using. This does not feel like enough pressure. Huey Romans says her machine is from 2003 and not working correctly.  ROS-see HPI Constitutional:   No-   weight loss, night sweats, fevers, chills, fatigue, lassitude. HEENT:   No-   headaches, difficulty swallowing, tooth/dental problems, sore throat,       No-  sneezing, itching, ear ache, nasal congestion, post nasal drip,  CV:  No-   chest pain, orthopnea, PND, swelling in lower extremities, anasarca, dizziness, palpitations Resp: No-   shortness of breath with exertion or at rest.             +productive cough,  No non-productive cough,  No- coughing up of blood.              No-   change in color of mucus.  No- wheezing.   Skin: No-   rash or lesions. GI:  No-   heartburn, indigestion, abdominal pain, nausea, vomiting,  GU:  MS:  + joint pain or swelling. + back pain. Neuro-     nothing unusual Psych:  No- change in mood or affect. No depression or anxiety.  No memory loss.  OBJ- Physical Exam General- Alert, Oriented, Affect-appropriate, Distress- none acute, overweight Skin- rash-none, lesions- none, excoriation- none Lymphadenopathy- none Head- atraumatic            Eyes- Gross vision intact, PERRLA, conjunctivae and secretions clear            Ears- Hearing, canals-normal            Nose- Clear, no-Septal dev, mucus, polyps, erosion, perforation             Throat- Mallampati II-III , mucosa clear , drainage- none, tonsils- atrophic Neck- flexible , trachea midline, no stridor , thyroid nl, carotid no  bruit Chest - symmetrical excursion , unlabored           Heart/CV- RRR , no murmur , no gallop  , no rub, nl s1 s2                           - JVD- none , edema- none, stasis changes- none, varices- none           Lung- clear to P&A, wheeze- none, cough- none , dullness-none, rub- none           Chest wall-  Abd-  Br/ Gen/ Rectal- Not done, not indicated Extrem- cyanosis- none, clubbing, none, atrophy- none, strength- nl. +walker. Neuro- grossly intact to observation

## 2014-01-21 NOTE — Patient Instructions (Signed)
Order- DME [facility]ue[facility] Romans autotitrate CPAP 5-15 x 7 days for pressure recommendation  [facility] parking  We can continue allergy vaccine if you can come regularly every 2 weeks or so, as long as you feel the shots help you.

## 2014-02-04 ENCOUNTER — Ambulatory Visit (INDEPENDENT_AMBULATORY_CARE_PROVIDER_SITE_OTHER): Payer: Medicare Other

## 2014-02-04 DIAGNOSIS — J309 Allergic rhinitis, unspecified: Secondary | ICD-10-CM | POA: Diagnosis not present

## 2014-02-07 ENCOUNTER — Encounter: Payer: Self-pay | Admitting: Internal Medicine

## 2014-02-07 NOTE — Assessment & Plan Note (Signed)
last vaccine strength refilled was 1:500. We had a frank discussion about her ability to get shots often enough to be effective. She indicates she is motivated to try, believes they help and does not want to stop. Plan-discussed with allergy lab

## 2014-02-07 NOTE — Assessment & Plan Note (Signed)
The CPAP machine she is using is old and malfunctioning according to [place]. Her compliance has been excellent Plan- replacement for old CPAP machine, autotitration for pressure recommendation

## 2014-02-11 DIAGNOSIS — F339 Major depressive disorder, recurrent, unspecified: Secondary | ICD-10-CM | POA: Diagnosis not present

## 2014-02-12 ENCOUNTER — Encounter: Payer: Self-pay | Admitting: Internal Medicine

## 2014-02-18 ENCOUNTER — Ambulatory Visit: Payer: Medicare Other

## 2014-02-18 DIAGNOSIS — M899 Disorder of bone, unspecified: Secondary | ICD-10-CM | POA: Diagnosis not present

## 2014-02-18 DIAGNOSIS — F329 Major depressive disorder, single episode, unspecified: Secondary | ICD-10-CM | POA: Diagnosis not present

## 2014-02-18 DIAGNOSIS — F3289 Other specified depressive episodes: Secondary | ICD-10-CM | POA: Diagnosis not present

## 2014-02-18 DIAGNOSIS — I4891 Unspecified atrial fibrillation: Secondary | ICD-10-CM | POA: Diagnosis not present

## 2014-02-18 DIAGNOSIS — E1149 Type 2 diabetes mellitus with other diabetic neurological complication: Secondary | ICD-10-CM | POA: Diagnosis not present

## 2014-02-18 DIAGNOSIS — E7800 Pure hypercholesterolemia, unspecified: Secondary | ICD-10-CM | POA: Diagnosis not present

## 2014-02-18 DIAGNOSIS — M949 Disorder of cartilage, unspecified: Secondary | ICD-10-CM | POA: Diagnosis not present

## 2014-02-18 DIAGNOSIS — I10 Essential (primary) hypertension: Secondary | ICD-10-CM | POA: Diagnosis not present

## 2014-02-23 ENCOUNTER — Ambulatory Visit (INDEPENDENT_AMBULATORY_CARE_PROVIDER_SITE_OTHER): Payer: Medicare Other

## 2014-02-23 DIAGNOSIS — I4891 Unspecified atrial fibrillation: Secondary | ICD-10-CM | POA: Diagnosis not present

## 2014-02-23 DIAGNOSIS — F329 Major depressive disorder, single episode, unspecified: Secondary | ICD-10-CM | POA: Diagnosis not present

## 2014-02-23 DIAGNOSIS — I10 Essential (primary) hypertension: Secondary | ICD-10-CM | POA: Diagnosis not present

## 2014-02-23 DIAGNOSIS — E1149 Type 2 diabetes mellitus with other diabetic neurological complication: Secondary | ICD-10-CM | POA: Diagnosis not present

## 2014-02-23 DIAGNOSIS — M899 Disorder of bone, unspecified: Secondary | ICD-10-CM | POA: Diagnosis not present

## 2014-02-23 DIAGNOSIS — F3289 Other specified depressive episodes: Secondary | ICD-10-CM | POA: Diagnosis not present

## 2014-02-23 DIAGNOSIS — J309 Allergic rhinitis, unspecified: Secondary | ICD-10-CM

## 2014-02-23 DIAGNOSIS — E7800 Pure hypercholesterolemia, unspecified: Secondary | ICD-10-CM | POA: Diagnosis not present

## 2014-02-25 DIAGNOSIS — K7460 Unspecified cirrhosis of liver: Secondary | ICD-10-CM | POA: Diagnosis not present

## 2014-03-02 ENCOUNTER — Ambulatory Visit (INDEPENDENT_AMBULATORY_CARE_PROVIDER_SITE_OTHER): Payer: Medicare Other

## 2014-03-02 DIAGNOSIS — IMO0001 Reserved for inherently not codable concepts without codable children: Secondary | ICD-10-CM | POA: Diagnosis not present

## 2014-03-02 DIAGNOSIS — M159 Polyosteoarthritis, unspecified: Secondary | ICD-10-CM | POA: Diagnosis not present

## 2014-03-02 DIAGNOSIS — M310 Hypersensitivity angiitis: Secondary | ICD-10-CM | POA: Diagnosis not present

## 2014-03-02 DIAGNOSIS — J309 Allergic rhinitis, unspecified: Secondary | ICD-10-CM

## 2014-03-02 DIAGNOSIS — IMO0002 Reserved for concepts with insufficient information to code with codable children: Secondary | ICD-10-CM | POA: Diagnosis not present

## 2014-03-08 DIAGNOSIS — L820 Inflamed seborrheic keratosis: Secondary | ICD-10-CM | POA: Diagnosis not present

## 2014-03-08 DIAGNOSIS — N289 Disorder of kidney and ureter, unspecified: Secondary | ICD-10-CM | POA: Diagnosis not present

## 2014-03-08 DIAGNOSIS — L821 Other seborrheic keratosis: Secondary | ICD-10-CM | POA: Diagnosis not present

## 2014-03-08 DIAGNOSIS — K7460 Unspecified cirrhosis of liver: Secondary | ICD-10-CM | POA: Diagnosis not present

## 2014-03-08 DIAGNOSIS — R161 Splenomegaly, not elsewhere classified: Secondary | ICD-10-CM | POA: Diagnosis not present

## 2014-03-08 DIAGNOSIS — B079 Viral wart, unspecified: Secondary | ICD-10-CM | POA: Diagnosis not present

## 2014-03-12 DIAGNOSIS — N289 Disorder of kidney and ureter, unspecified: Secondary | ICD-10-CM | POA: Diagnosis not present

## 2014-03-12 DIAGNOSIS — K7460 Unspecified cirrhosis of liver: Secondary | ICD-10-CM | POA: Diagnosis not present

## 2014-03-16 ENCOUNTER — Ambulatory Visit (INDEPENDENT_AMBULATORY_CARE_PROVIDER_SITE_OTHER): Payer: Medicare Other

## 2014-03-16 DIAGNOSIS — J309 Allergic rhinitis, unspecified: Secondary | ICD-10-CM

## 2014-03-22 ENCOUNTER — Ambulatory Visit (INDEPENDENT_AMBULATORY_CARE_PROVIDER_SITE_OTHER): Payer: Medicare Other

## 2014-03-22 DIAGNOSIS — J309 Allergic rhinitis, unspecified: Secondary | ICD-10-CM | POA: Diagnosis not present

## 2014-03-25 DIAGNOSIS — K7460 Unspecified cirrhosis of liver: Secondary | ICD-10-CM | POA: Diagnosis not present

## 2014-03-25 DIAGNOSIS — K219 Gastro-esophageal reflux disease without esophagitis: Secondary | ICD-10-CM | POA: Diagnosis not present

## 2014-03-30 ENCOUNTER — Telehealth: Payer: Self-pay | Admitting: Internal Medicine

## 2014-03-30 NOTE — Telephone Encounter (Signed)
i called and spoke with pt. Made her aware this was already on her medication list. She needed nothing further

## 2014-04-05 ENCOUNTER — Ambulatory Visit (INDEPENDENT_AMBULATORY_CARE_PROVIDER_SITE_OTHER): Payer: Medicare Other

## 2014-04-05 DIAGNOSIS — J309 Allergic rhinitis, unspecified: Secondary | ICD-10-CM

## 2014-04-19 ENCOUNTER — Ambulatory Visit (INDEPENDENT_AMBULATORY_CARE_PROVIDER_SITE_OTHER): Payer: Medicare Other

## 2014-04-19 DIAGNOSIS — J309 Allergic rhinitis, unspecified: Secondary | ICD-10-CM

## 2014-04-28 ENCOUNTER — Ambulatory Visit (INDEPENDENT_AMBULATORY_CARE_PROVIDER_SITE_OTHER): Payer: Medicare Other

## 2014-04-28 VITALS — BP 174/87 | HR 70 | Resp 18

## 2014-04-28 DIAGNOSIS — M79609 Pain in unspecified limb: Secondary | ICD-10-CM

## 2014-04-28 DIAGNOSIS — L608 Other nail disorders: Secondary | ICD-10-CM

## 2014-04-28 DIAGNOSIS — B070 Plantar wart: Secondary | ICD-10-CM

## 2014-04-28 DIAGNOSIS — E1149 Type 2 diabetes mellitus with other diabetic neurological complication: Secondary | ICD-10-CM | POA: Diagnosis not present

## 2014-04-28 DIAGNOSIS — E1140 Type 2 diabetes mellitus with diabetic neuropathy, unspecified: Secondary | ICD-10-CM

## 2014-04-28 MED ORDER — FLUOROURACIL 5 % EX CREA: TOPICAL_CREAM | Freq: Two times a day (BID) | CUTANEOUS | Status: DC

## 2014-04-28 NOTE — Progress Notes (Signed)
   Subjective:    Patient ID: [name] [name], female    DOB: 11-03-1946, 68 y.o.   MRN: [id number]  HPI I am here to get these toenails trimmed up and I got some braces for my shoes and got them in [place] and has been two years since I got them and I have a plantars wart and has been going on for about 2 years and sore and tender and hurts with shoes and is on the ball of my right foot    Review of Systems no new findings or systemic changes are noted     Objective:   Physical Exam Patient is a 68 year old white female well-developed well-nourished oriented x3 its been more than a year since she was last seen presents at this time for diabetic foot and nail care debridement of nails also evaluation of and possible excision of suspect wart just to warts in the first webspace area or interspace area plantar right foot.  Lower extremity objective findings as follows vascular status is intact DP +2/4 bilateral PT plus one over 4 bilateral capillary refill time 3 seconds all digits skin temperature is warm turgor diminished there is no edema rubor pallor or varicosities noted there is some hyperpigmentation both legs and has a small blistered lesion medial calf area of her left leg and excoriation or a laceration which is healing well patient may have bumped her foot and she had since the swelling recently which is improving is supposed to be wearing compression stockings not in place at today's visit. Patient continues to have a verrucoid lesion of the first interspace area plantar right foot has a new lesion just proximal to the digital sulcus area. The largest is greater than a centimeter diameter the second is about 0.5 cm in diameter nails thick brittle crumbly friable dystrophic with crepitus incurvation and tenderness on palpation and with enclosed shoe wear and pulses palpable epicritic and proprioceptive sensations significantly diminished on Semmes Weinstein testing to forefoot digits and  plantar arch intact sensation of the leg although also diminished on the medial calf area. Orthopedic biomechanical exam rigid contractures of digits no signs of fracture no osseous abnormalities neurologically no other open wounds or ulcers are excoriation is noted on the foot.       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 this time as far as the wart patient expected possibly have been cutout or based on her vascular status in history and she's taking aspirin therapy she would need to discontinue for about 5 days my recommendation is to consider other noninvasive options first she is amenable to that we'll do a trial of Efudex 5-fluorouracil topical applications if we can train for reduced lesions reevaluate in 2-3 months for further palliative care and nail debridement and reassessment of the verruca if needed next  [name] [name] DPM

## 2014-04-28 NOTE — Patient Instructions (Signed)
Diabetes and Foot Care Diabetes may cause you to have problems because of poor blood supply (circulation) to your feet and legs. This may cause the skin on your feet to become thinner, break easier, and heal more slowly. Your skin may become dry, and the skin may peel and crack. You may also have nerve damage in your legs and feet causing decreased feeling in them. You may not notice minor injuries to your feet that could lead to infections or more serious problems. Taking care of your feet is one of the most important things you can do for yourself.  HOME CARE INSTRUCTIONS  Wear shoes at all times, even in the house. Do not go barefoot. Bare feet are easily injured.  Check your feet daily for blisters, cuts, and redness. If you cannot see the bottom of your feet, use a mirror or ask someone for help.  Wash your feet with warm water (do not use hot water) and mild soap. Then pat your feet and the areas between your toes until they are completely dry. Do not soak your feet as this can dry your skin.  Apply a moisturizing lotion or petroleum jelly (that does not contain alcohol and is unscented) to the skin on your feet and to dry, brittle toenails. Do not apply lotion between your toes.  Trim your toenails straight across. Do not dig under them or around the cuticle. File the edges of your nails with an emery board or nail file.  Do not cut corns or calluses or try to remove them with medicine.  Wear clean socks or stockings every day. Make sure they are not too tight. Do not wear knee-high stockings since they may decrease blood flow to your legs.  Wear shoes that fit properly and have enough cushioning. To break in new shoes, wear them for just a few hours a day. This prevents you from injuring your feet. Always look in your shoes before you put them on to be sure there are no objects inside.  Do not cross your legs. This may decrease the blood flow to your feet.  If you find a minor scrape,  cut, or break in the skin on your feet, keep it and the skin around it clean and dry. These areas may be cleansed with mild soap and water. Do not cleanse the area with peroxide, alcohol, or iodine.  When you remove an adhesive bandage, be sure not to damage the skin around it.  If you have a wound, look at it several times a day to make sure it is healing.  Do not use heating pads or hot water bottles. They may burn your skin. If you have lost feeling in your feet or legs, you may not know it is happening until it is too late.  Make sure your health care provider performs a complete foot exam at least annually or more often if you have foot problems. Report any cuts, sores, or bruises to your health care provider immediately. SEEK MEDICAL CARE IF:   You have an injury that is not healing.  You have cuts or breaks in the skin.  You have an ingrown nail.  You notice redness on your legs or feet.  You feel burning or tingling in your legs or feet.  You have pain or cramps in your legs and feet.  Your legs or feet are numb.  Your feet always feel cold. SEEK IMMEDIATE MEDICAL CARE IF:   There is increasing redness,   swelling, or pain in or around a wound.  There is a red line that goes up your leg.  Pus is coming from a wound.  You develop a fever or as directed by your health care provider.  You notice a bad smell coming from an ulcer or wound. Document Released: 11/02/2000 Document Revised: 07/08/2013 Document Reviewed: 04/14/2013 [facility] Patient Information 2014 [employer].      Recommendations for treatment of the warts Apply the prescribed Efudex, 5-fluorouracil 5% cream to the affected area of wart twice daily for the next 30-60 days as instructed. Do not apply to any the healthy skin only to the wart tissue itself discontinue if there is any skin irritation or difficulties. Followup as scheduled visit with in 2-3 months for reevaluation

## 2014-04-30 ENCOUNTER — Ambulatory Visit (INDEPENDENT_AMBULATORY_CARE_PROVIDER_SITE_OTHER): Payer: Medicare Other

## 2014-04-30 DIAGNOSIS — L988 Other specified disorders of the skin and subcutaneous tissue: Secondary | ICD-10-CM | POA: Diagnosis not present

## 2014-04-30 DIAGNOSIS — J309 Allergic rhinitis, unspecified: Secondary | ICD-10-CM | POA: Diagnosis not present

## 2014-04-30 DIAGNOSIS — R635 Abnormal weight gain: Secondary | ICD-10-CM | POA: Diagnosis not present

## 2014-04-30 DIAGNOSIS — Z6841 Body Mass Index (BMI) 40.0 and over, adult: Secondary | ICD-10-CM | POA: Diagnosis not present

## 2014-04-30 DIAGNOSIS — E1149 Type 2 diabetes mellitus with other diabetic neurological complication: Secondary | ICD-10-CM | POA: Diagnosis not present

## 2014-04-30 DIAGNOSIS — R946 Abnormal results of thyroid function studies: Secondary | ICD-10-CM | POA: Diagnosis not present

## 2014-04-30 DIAGNOSIS — E669 Obesity, unspecified: Secondary | ICD-10-CM | POA: Diagnosis not present

## 2014-04-30 DIAGNOSIS — E1142 Type 2 diabetes mellitus with diabetic polyneuropathy: Secondary | ICD-10-CM | POA: Diagnosis not present

## 2014-05-06 DIAGNOSIS — I8390 Asymptomatic varicose veins of unspecified lower extremity: Secondary | ICD-10-CM | POA: Diagnosis not present

## 2014-05-06 DIAGNOSIS — I509 Heart failure, unspecified: Secondary | ICD-10-CM | POA: Diagnosis not present

## 2014-05-06 DIAGNOSIS — E669 Obesity, unspecified: Secondary | ICD-10-CM | POA: Diagnosis not present

## 2014-05-06 DIAGNOSIS — T1490XA Injury, unspecified, initial encounter: Secondary | ICD-10-CM | POA: Diagnosis not present

## 2014-05-06 DIAGNOSIS — L97209 Non-pressure chronic ulcer of unspecified calf with unspecified severity: Secondary | ICD-10-CM | POA: Diagnosis not present

## 2014-05-06 DIAGNOSIS — I4891 Unspecified atrial fibrillation: Secondary | ICD-10-CM | POA: Diagnosis not present

## 2014-05-06 DIAGNOSIS — L97909 Non-pressure chronic ulcer of unspecified part of unspecified lower leg with unspecified severity: Secondary | ICD-10-CM | POA: Diagnosis not present

## 2014-05-06 DIAGNOSIS — E1159 Type 2 diabetes mellitus with other circulatory complications: Secondary | ICD-10-CM | POA: Diagnosis not present

## 2014-05-06 DIAGNOSIS — E1149 Type 2 diabetes mellitus with other diabetic neurological complication: Secondary | ICD-10-CM | POA: Diagnosis not present

## 2014-05-06 DIAGNOSIS — I10 Essential (primary) hypertension: Secondary | ICD-10-CM | POA: Diagnosis not present

## 2014-05-06 DIAGNOSIS — Z9981 Dependence on supplemental oxygen: Secondary | ICD-10-CM | POA: Diagnosis not present

## 2014-05-06 DIAGNOSIS — Z7982 Long term (current) use of aspirin: Secondary | ICD-10-CM | POA: Diagnosis not present

## 2014-05-07 ENCOUNTER — Ambulatory Visit: Payer: Medicare Other

## 2014-05-13 DIAGNOSIS — E1149 Type 2 diabetes mellitus with other diabetic neurological complication: Secondary | ICD-10-CM | POA: Diagnosis not present

## 2014-05-13 DIAGNOSIS — E669 Obesity, unspecified: Secondary | ICD-10-CM | POA: Diagnosis not present

## 2014-05-13 DIAGNOSIS — I4891 Unspecified atrial fibrillation: Secondary | ICD-10-CM | POA: Diagnosis not present

## 2014-05-13 DIAGNOSIS — E1159 Type 2 diabetes mellitus with other circulatory complications: Secondary | ICD-10-CM | POA: Diagnosis not present

## 2014-05-13 DIAGNOSIS — L97209 Non-pressure chronic ulcer of unspecified calf with unspecified severity: Secondary | ICD-10-CM | POA: Diagnosis not present

## 2014-05-13 DIAGNOSIS — I509 Heart failure, unspecified: Secondary | ICD-10-CM | POA: Diagnosis not present

## 2014-05-14 DIAGNOSIS — F339 Major depressive disorder, recurrent, unspecified: Secondary | ICD-10-CM | POA: Diagnosis not present

## 2014-05-18 ENCOUNTER — Ambulatory Visit (INDEPENDENT_AMBULATORY_CARE_PROVIDER_SITE_OTHER): Payer: Medicare Other

## 2014-05-18 DIAGNOSIS — J309 Allergic rhinitis, unspecified: Secondary | ICD-10-CM | POA: Diagnosis not present

## 2014-05-20 DIAGNOSIS — I10 Essential (primary) hypertension: Secondary | ICD-10-CM | POA: Diagnosis not present

## 2014-05-20 DIAGNOSIS — E1149 Type 2 diabetes mellitus with other diabetic neurological complication: Secondary | ICD-10-CM | POA: Diagnosis not present

## 2014-05-20 DIAGNOSIS — E1159 Type 2 diabetes mellitus with other circulatory complications: Secondary | ICD-10-CM | POA: Diagnosis not present

## 2014-05-20 DIAGNOSIS — T1490XA Injury, unspecified, initial encounter: Secondary | ICD-10-CM | POA: Diagnosis not present

## 2014-05-20 DIAGNOSIS — E669 Obesity, unspecified: Secondary | ICD-10-CM | POA: Diagnosis not present

## 2014-05-20 DIAGNOSIS — Z7982 Long term (current) use of aspirin: Secondary | ICD-10-CM | POA: Diagnosis not present

## 2014-05-20 DIAGNOSIS — I509 Heart failure, unspecified: Secondary | ICD-10-CM | POA: Diagnosis not present

## 2014-05-20 DIAGNOSIS — Z9981 Dependence on supplemental oxygen: Secondary | ICD-10-CM | POA: Diagnosis not present

## 2014-05-20 DIAGNOSIS — I4891 Unspecified atrial fibrillation: Secondary | ICD-10-CM | POA: Diagnosis not present

## 2014-05-20 DIAGNOSIS — I8390 Asymptomatic varicose veins of unspecified lower extremity: Secondary | ICD-10-CM | POA: Diagnosis not present

## 2014-05-20 DIAGNOSIS — L97209 Non-pressure chronic ulcer of unspecified calf with unspecified severity: Secondary | ICD-10-CM | POA: Diagnosis not present

## 2014-05-24 DIAGNOSIS — E1149 Type 2 diabetes mellitus with other diabetic neurological complication: Secondary | ICD-10-CM | POA: Diagnosis not present

## 2014-05-24 DIAGNOSIS — I10 Essential (primary) hypertension: Secondary | ICD-10-CM | POA: Diagnosis not present

## 2014-05-24 DIAGNOSIS — E7800 Pure hypercholesterolemia, unspecified: Secondary | ICD-10-CM | POA: Diagnosis not present

## 2014-05-24 DIAGNOSIS — D4100 Neoplasm of uncertain behavior of unspecified kidney: Secondary | ICD-10-CM | POA: Diagnosis not present

## 2014-06-01 ENCOUNTER — Ambulatory Visit: Payer: Medicare Other

## 2014-06-04 ENCOUNTER — Ambulatory Visit (INDEPENDENT_AMBULATORY_CARE_PROVIDER_SITE_OTHER): Payer: Medicare Other

## 2014-06-04 DIAGNOSIS — J309 Allergic rhinitis, unspecified: Secondary | ICD-10-CM

## 2014-06-21 ENCOUNTER — Ambulatory Visit (INDEPENDENT_AMBULATORY_CARE_PROVIDER_SITE_OTHER): Payer: Medicare Other

## 2014-06-21 DIAGNOSIS — J309 Allergic rhinitis, unspecified: Secondary | ICD-10-CM | POA: Diagnosis not present

## 2014-06-28 ENCOUNTER — Ambulatory Visit (INDEPENDENT_AMBULATORY_CARE_PROVIDER_SITE_OTHER): Payer: Medicare Other

## 2014-06-28 DIAGNOSIS — J309 Allergic rhinitis, unspecified: Secondary | ICD-10-CM

## 2014-06-29 ENCOUNTER — Ambulatory Visit (INDEPENDENT_AMBULATORY_CARE_PROVIDER_SITE_OTHER): Payer: Medicare Other

## 2014-06-29 DIAGNOSIS — J309 Allergic rhinitis, unspecified: Secondary | ICD-10-CM | POA: Diagnosis not present

## 2014-07-05 ENCOUNTER — Ambulatory Visit (INDEPENDENT_AMBULATORY_CARE_PROVIDER_SITE_OTHER): Payer: Medicare Other

## 2014-07-05 DIAGNOSIS — J309 Allergic rhinitis, unspecified: Secondary | ICD-10-CM

## 2014-07-05 DIAGNOSIS — R946 Abnormal results of thyroid function studies: Secondary | ICD-10-CM | POA: Diagnosis not present

## 2014-07-08 ENCOUNTER — Ambulatory Visit: Payer: Medicare Other

## 2014-07-12 ENCOUNTER — Ambulatory Visit (INDEPENDENT_AMBULATORY_CARE_PROVIDER_SITE_OTHER): Payer: Medicare Other

## 2014-07-12 DIAGNOSIS — J309 Allergic rhinitis, unspecified: Secondary | ICD-10-CM | POA: Diagnosis not present

## 2014-07-22 ENCOUNTER — Ambulatory Visit (INDEPENDENT_AMBULATORY_CARE_PROVIDER_SITE_OTHER): Payer: Medicare Other

## 2014-07-22 DIAGNOSIS — J309 Allergic rhinitis, unspecified: Secondary | ICD-10-CM

## 2014-07-27 ENCOUNTER — Ambulatory Visit: Payer: Medicare Other | Admitting: Internal Medicine

## 2014-07-27 DIAGNOSIS — K7460 Unspecified cirrhosis of liver: Secondary | ICD-10-CM | POA: Diagnosis not present

## 2014-07-27 DIAGNOSIS — D4100 Neoplasm of uncertain behavior of unspecified kidney: Secondary | ICD-10-CM | POA: Diagnosis not present

## 2014-07-27 DIAGNOSIS — I517 Cardiomegaly: Secondary | ICD-10-CM | POA: Diagnosis not present

## 2014-07-27 DIAGNOSIS — K766 Portal hypertension: Secondary | ICD-10-CM | POA: Diagnosis not present

## 2014-07-28 ENCOUNTER — Ambulatory Visit (INDEPENDENT_AMBULATORY_CARE_PROVIDER_SITE_OTHER): Payer: Medicare Other

## 2014-07-28 ENCOUNTER — Ambulatory Visit: Payer: Medicare Other | Admitting: Internal Medicine

## 2014-07-28 VITALS — BP 137/63 | HR 59 | Resp 18

## 2014-07-28 DIAGNOSIS — E1140 Type 2 diabetes mellitus with diabetic neuropathy, unspecified: Secondary | ICD-10-CM

## 2014-07-28 DIAGNOSIS — L608 Other nail disorders: Secondary | ICD-10-CM | POA: Diagnosis not present

## 2014-07-28 DIAGNOSIS — E1149 Type 2 diabetes mellitus with other diabetic neurological complication: Secondary | ICD-10-CM

## 2014-07-28 DIAGNOSIS — E1142 Type 2 diabetes mellitus with diabetic polyneuropathy: Secondary | ICD-10-CM

## 2014-07-28 NOTE — Patient Instructions (Signed)
Diabetes and Foot Care Diabetes may cause you to have problems because of poor blood supply (circulation) to your feet and legs. This may cause the skin on your feet to become thinner, break easier, and heal more slowly. Your skin may become dry, and the skin may peel and crack. You may also have nerve damage in your legs and feet causing decreased feeling in them. You may not notice minor injuries to your feet that could lead to infections or more serious problems. Taking care of your feet is one of the most important things you can do for yourself.  HOME CARE INSTRUCTIONS  Wear shoes at all times, even in the house. Do not go barefoot. Bare feet are easily injured.  Check your feet daily for blisters, cuts, and redness. If you cannot see the bottom of your feet, use a mirror or ask someone for help.  Wash your feet with warm water (do not use hot water) and mild soap. Then pat your feet and the areas between your toes until they are completely dry. Do not soak your feet as this can dry your skin.  Apply a moisturizing lotion or petroleum jelly (that does not contain alcohol and is unscented) to the skin on your feet and to dry, brittle toenails. Do not apply lotion between your toes.  Trim your toenails straight across. Do not dig under them or around the cuticle. File the edges of your nails with an emery board or nail file.  Do not cut corns or calluses or try to remove them with medicine.  Wear clean socks or stockings every day. Make sure they are not too tight. Do not wear knee-high stockings since they may decrease blood flow to your legs.  Wear shoes that fit properly and have enough cushioning. To break in new shoes, wear them for just a few hours a day. This prevents you from injuring your feet. Always look in your shoes before you put them on to be sure there are no objects inside.  Do not cross your legs. This may decrease the blood flow to your feet.  If you find a minor scrape,  cut, or break in the skin on your feet, keep it and the skin around it clean and dry. These areas may be cleansed with mild soap and water. Do not cleanse the area with peroxide, alcohol, or iodine.  When you remove an adhesive bandage, be sure not to damage the skin around it.  If you have a wound, look at it several times a day to make sure it is healing.  Do not use heating pads or hot water bottles. They may burn your skin. If you have lost feeling in your feet or legs, you may not know it is happening until it is too late.  Make sure your health care provider performs a complete foot exam at least annually or more often if you have foot problems. Report any cuts, sores, or bruises to your health care provider immediately. SEEK MEDICAL CARE IF:   You have an injury that is not healing.  You have cuts or breaks in the skin.  You have an ingrown nail.  You notice redness on your legs or feet.  You feel burning or tingling in your legs or feet.  You have pain or cramps in your legs and feet.  Your legs or feet are numb.  Your feet always feel cold. SEEK IMMEDIATE MEDICAL CARE IF:   There is increasing redness,   swelling, or pain in or around a wound.  There is a red line that goes up your leg.  Pus is coming from a wound.  You develop a fever or as directed by your health care provider.  You notice a bad smell coming from an ulcer or wound. Document Released: 11/02/2000 Document Revised: 07/08/2013 Document Reviewed: 04/14/2013 [employer] Patient Information 2015 [employer]. This information is not intended to replace advice given to you by your health care provider. Make sure you discuss any questions you have with your health care provider.  

## 2014-07-28 NOTE — Progress Notes (Signed)
   Subjective:    Patient ID: [name] [name], female    DOB: 29-Jun-1946, 68 y.o.   MRN: [id number]  HPI I AM HERE TO GET MY TOENAILS TRIMMED UP AND I GOT NEW SHOES AND I ALSO HAVE BRACES AND I FELL LAST WEEK AND I HAD SOME BRUISING ON MY RIGHT FOOT BUT I AM DOING BETTER     Review of Systems no new findings or systemic changes noted of them history of contusion from her fall. Wearing new shoes and braces.     Objective:   Physical Exam Lower extremity objective findings intact and unchanged from previous visits DP pulse +2/4 bilateral PT plus one over 4 bilateral there is some venous insufficiency and stasis dermatitis both lower extremities with hyperpigmentation skin temperature and turgor diminished no rubor pallor noted mild varicosities noted. There is decreased sensation Semmes Weinstein to the forefoot digits and plantar arch consistent with diabetic neuropathy. There is small verrucoid lesion first interspace and plantar first MTP area right which is greatly reduced in size no longer painful or symptomatic so superficial residual eschar tissue was debrided away there is no pinpoint bleeding no signs of infection or verruca appears to be resolving on its own at this time nails thick brittle crumbly friable dystrophic 1 through 5 right 2 through 5 left having had previously avulsed left hallux nail. No open wounds or ulcers no secondary infection is noted at this time.       Assessment & Plan:  Assessment this time his diabetes with peripheral neuropathy and angiopathy thick brittle dystrophic friable nails debrided 1 through 5 right 2 through 5 left total of my nails debrided this time maintain appropriate diabetic shoes and AFO braces . No other new complaints recheck in 3 months for followup and continued palliative care in the future.  Harriet Masson DPM

## 2014-07-29 ENCOUNTER — Ambulatory Visit: Payer: Medicare Other

## 2014-08-05 ENCOUNTER — Ambulatory Visit (INDEPENDENT_AMBULATORY_CARE_PROVIDER_SITE_OTHER): Payer: Medicare Other

## 2014-08-05 DIAGNOSIS — J309 Allergic rhinitis, unspecified: Secondary | ICD-10-CM

## 2014-08-09 DIAGNOSIS — E11329 Type 2 diabetes mellitus with mild nonproliferative diabetic retinopathy without macular edema: Secondary | ICD-10-CM | POA: Diagnosis not present

## 2014-08-09 DIAGNOSIS — E1139 Type 2 diabetes mellitus with other diabetic ophthalmic complication: Secondary | ICD-10-CM | POA: Diagnosis not present

## 2014-08-12 ENCOUNTER — Ambulatory Visit (INDEPENDENT_AMBULATORY_CARE_PROVIDER_SITE_OTHER): Payer: Medicare Other

## 2014-08-12 DIAGNOSIS — F339 Major depressive disorder, recurrent, unspecified: Secondary | ICD-10-CM | POA: Diagnosis not present

## 2014-08-12 DIAGNOSIS — J309 Allergic rhinitis, unspecified: Secondary | ICD-10-CM | POA: Diagnosis not present

## 2014-08-16 DIAGNOSIS — I509 Heart failure, unspecified: Secondary | ICD-10-CM | POA: Diagnosis not present

## 2014-08-16 DIAGNOSIS — I4891 Unspecified atrial fibrillation: Secondary | ICD-10-CM | POA: Diagnosis not present

## 2014-08-16 DIAGNOSIS — Z8673 Personal history of transient ischemic attack (TIA), and cerebral infarction without residual deficits: Secondary | ICD-10-CM | POA: Diagnosis not present

## 2014-08-16 DIAGNOSIS — Z7982 Long term (current) use of aspirin: Secondary | ICD-10-CM | POA: Diagnosis not present

## 2014-08-16 DIAGNOSIS — T1490XA Injury, unspecified, initial encounter: Secondary | ICD-10-CM | POA: Diagnosis not present

## 2014-08-16 DIAGNOSIS — I252 Old myocardial infarction: Secondary | ICD-10-CM | POA: Diagnosis not present

## 2014-08-16 DIAGNOSIS — L97209 Non-pressure chronic ulcer of unspecified calf with unspecified severity: Secondary | ICD-10-CM | POA: Diagnosis not present

## 2014-08-16 DIAGNOSIS — Z9981 Dependence on supplemental oxygen: Secondary | ICD-10-CM | POA: Diagnosis not present

## 2014-08-16 DIAGNOSIS — I10 Essential (primary) hypertension: Secondary | ICD-10-CM | POA: Diagnosis not present

## 2014-08-16 DIAGNOSIS — E1149 Type 2 diabetes mellitus with other diabetic neurological complication: Secondary | ICD-10-CM | POA: Diagnosis not present

## 2014-08-16 DIAGNOSIS — I8390 Asymptomatic varicose veins of unspecified lower extremity: Secondary | ICD-10-CM | POA: Diagnosis not present

## 2014-08-18 ENCOUNTER — Ambulatory Visit: Payer: Medicare Other

## 2014-08-23 DIAGNOSIS — I252 Old myocardial infarction: Secondary | ICD-10-CM | POA: Diagnosis not present

## 2014-08-23 DIAGNOSIS — I10 Essential (primary) hypertension: Secondary | ICD-10-CM | POA: Diagnosis not present

## 2014-08-23 DIAGNOSIS — I4891 Unspecified atrial fibrillation: Secondary | ICD-10-CM | POA: Diagnosis not present

## 2014-08-23 DIAGNOSIS — I872 Venous insufficiency (chronic) (peripheral): Secondary | ICD-10-CM | POA: Diagnosis not present

## 2014-08-23 DIAGNOSIS — I509 Heart failure, unspecified: Secondary | ICD-10-CM | POA: Diagnosis not present

## 2014-08-23 DIAGNOSIS — Z7982 Long term (current) use of aspirin: Secondary | ICD-10-CM | POA: Diagnosis not present

## 2014-08-23 DIAGNOSIS — E1140 Type 2 diabetes mellitus with diabetic neuropathy, unspecified: Secondary | ICD-10-CM | POA: Diagnosis not present

## 2014-08-23 DIAGNOSIS — L97909 Non-pressure chronic ulcer of unspecified part of unspecified lower leg with unspecified severity: Secondary | ICD-10-CM | POA: Diagnosis not present

## 2014-08-23 DIAGNOSIS — T1490 Injury, unspecified: Secondary | ICD-10-CM | POA: Diagnosis not present

## 2014-08-23 DIAGNOSIS — Z87820 Personal history of traumatic brain injury: Secondary | ICD-10-CM | POA: Diagnosis not present

## 2014-08-23 DIAGNOSIS — Z9981 Dependence on supplemental oxygen: Secondary | ICD-10-CM | POA: Diagnosis not present

## 2014-08-25 ENCOUNTER — Encounter: Payer: Self-pay | Admitting: Internal Medicine

## 2014-08-25 ENCOUNTER — Ambulatory Visit (INDEPENDENT_AMBULATORY_CARE_PROVIDER_SITE_OTHER): Payer: Medicare Other

## 2014-08-25 DIAGNOSIS — R946 Abnormal results of thyroid function studies: Secondary | ICD-10-CM | POA: Diagnosis not present

## 2014-08-25 DIAGNOSIS — J309 Allergic rhinitis, unspecified: Secondary | ICD-10-CM

## 2014-08-27 ENCOUNTER — Ambulatory Visit: Payer: Medicare Other | Admitting: Internal Medicine

## 2014-08-30 DIAGNOSIS — I509 Heart failure, unspecified: Secondary | ICD-10-CM | POA: Diagnosis not present

## 2014-08-30 DIAGNOSIS — L97901 Non-pressure chronic ulcer of unspecified part of unspecified lower leg limited to breakdown of skin: Secondary | ICD-10-CM | POA: Diagnosis not present

## 2014-08-30 DIAGNOSIS — I4891 Unspecified atrial fibrillation: Secondary | ICD-10-CM | POA: Diagnosis not present

## 2014-08-30 DIAGNOSIS — I252 Old myocardial infarction: Secondary | ICD-10-CM | POA: Diagnosis not present

## 2014-08-30 DIAGNOSIS — L97909 Non-pressure chronic ulcer of unspecified part of unspecified lower leg with unspecified severity: Secondary | ICD-10-CM | POA: Diagnosis not present

## 2014-08-30 DIAGNOSIS — I872 Venous insufficiency (chronic) (peripheral): Secondary | ICD-10-CM | POA: Diagnosis not present

## 2014-08-30 DIAGNOSIS — E1140 Type 2 diabetes mellitus with diabetic neuropathy, unspecified: Secondary | ICD-10-CM | POA: Diagnosis not present

## 2014-08-31 ENCOUNTER — Ambulatory Visit: Payer: Medicare Other

## 2014-09-03 DIAGNOSIS — J209 Acute bronchitis, unspecified: Secondary | ICD-10-CM | POA: Diagnosis not present

## 2014-09-07 DIAGNOSIS — I872 Venous insufficiency (chronic) (peripheral): Secondary | ICD-10-CM | POA: Diagnosis not present

## 2014-09-07 DIAGNOSIS — I252 Old myocardial infarction: Secondary | ICD-10-CM | POA: Diagnosis not present

## 2014-09-07 DIAGNOSIS — M150 Primary generalized (osteo)arthritis: Secondary | ICD-10-CM | POA: Diagnosis not present

## 2014-09-07 DIAGNOSIS — I509 Heart failure, unspecified: Secondary | ICD-10-CM | POA: Diagnosis not present

## 2014-09-07 DIAGNOSIS — M310 Hypersensitivity angiitis: Secondary | ICD-10-CM | POA: Diagnosis not present

## 2014-09-07 DIAGNOSIS — M5136 Other intervertebral disc degeneration, lumbar region: Secondary | ICD-10-CM | POA: Diagnosis not present

## 2014-09-07 DIAGNOSIS — I4891 Unspecified atrial fibrillation: Secondary | ICD-10-CM | POA: Diagnosis not present

## 2014-09-07 DIAGNOSIS — L97909 Non-pressure chronic ulcer of unspecified part of unspecified lower leg with unspecified severity: Secondary | ICD-10-CM | POA: Diagnosis not present

## 2014-09-07 DIAGNOSIS — E1140 Type 2 diabetes mellitus with diabetic neuropathy, unspecified: Secondary | ICD-10-CM | POA: Diagnosis not present

## 2014-09-07 DIAGNOSIS — M797 Fibromyalgia: Secondary | ICD-10-CM | POA: Diagnosis not present

## 2014-09-14 DIAGNOSIS — Z0000 Encounter for general adult medical examination without abnormal findings: Secondary | ICD-10-CM | POA: Diagnosis not present

## 2014-09-14 DIAGNOSIS — E1165 Type 2 diabetes mellitus with hyperglycemia: Secondary | ICD-10-CM | POA: Diagnosis not present

## 2014-09-14 DIAGNOSIS — I872 Venous insufficiency (chronic) (peripheral): Secondary | ICD-10-CM | POA: Diagnosis not present

## 2014-09-14 DIAGNOSIS — E1140 Type 2 diabetes mellitus with diabetic neuropathy, unspecified: Secondary | ICD-10-CM | POA: Diagnosis not present

## 2014-09-14 DIAGNOSIS — Z113 Encounter for screening for infections with a predominantly sexual mode of transmission: Secondary | ICD-10-CM | POA: Diagnosis not present

## 2014-09-14 DIAGNOSIS — I10 Essential (primary) hypertension: Secondary | ICD-10-CM | POA: Diagnosis not present

## 2014-09-14 DIAGNOSIS — Z1239 Encounter for other screening for malignant neoplasm of breast: Secondary | ICD-10-CM | POA: Diagnosis not present

## 2014-09-14 DIAGNOSIS — E780 Pure hypercholesterolemia: Secondary | ICD-10-CM | POA: Diagnosis not present

## 2014-09-14 DIAGNOSIS — I4891 Unspecified atrial fibrillation: Secondary | ICD-10-CM | POA: Diagnosis not present

## 2014-09-14 DIAGNOSIS — I252 Old myocardial infarction: Secondary | ICD-10-CM | POA: Diagnosis not present

## 2014-09-14 DIAGNOSIS — Z1389 Encounter for screening for other disorder: Secondary | ICD-10-CM | POA: Diagnosis not present

## 2014-09-14 DIAGNOSIS — I509 Heart failure, unspecified: Secondary | ICD-10-CM | POA: Diagnosis not present

## 2014-09-14 DIAGNOSIS — L97909 Non-pressure chronic ulcer of unspecified part of unspecified lower leg with unspecified severity: Secondary | ICD-10-CM | POA: Diagnosis not present

## 2014-09-14 DIAGNOSIS — Z23 Encounter for immunization: Secondary | ICD-10-CM | POA: Diagnosis not present

## 2014-09-21 DIAGNOSIS — I252 Old myocardial infarction: Secondary | ICD-10-CM | POA: Diagnosis not present

## 2014-09-21 DIAGNOSIS — Z7982 Long term (current) use of aspirin: Secondary | ICD-10-CM | POA: Diagnosis not present

## 2014-09-21 DIAGNOSIS — I509 Heart failure, unspecified: Secondary | ICD-10-CM | POA: Diagnosis not present

## 2014-09-21 DIAGNOSIS — Z87820 Personal history of traumatic brain injury: Secondary | ICD-10-CM | POA: Diagnosis not present

## 2014-09-21 DIAGNOSIS — Z9981 Dependence on supplemental oxygen: Secondary | ICD-10-CM | POA: Diagnosis not present

## 2014-09-21 DIAGNOSIS — L97211 Non-pressure chronic ulcer of right calf limited to breakdown of skin: Secondary | ICD-10-CM | POA: Diagnosis not present

## 2014-09-21 DIAGNOSIS — I872 Venous insufficiency (chronic) (peripheral): Secondary | ICD-10-CM | POA: Diagnosis not present

## 2014-09-21 DIAGNOSIS — T1490 Injury, unspecified: Secondary | ICD-10-CM | POA: Diagnosis not present

## 2014-09-21 DIAGNOSIS — I10 Essential (primary) hypertension: Secondary | ICD-10-CM | POA: Diagnosis not present

## 2014-09-21 DIAGNOSIS — E1140 Type 2 diabetes mellitus with diabetic neuropathy, unspecified: Secondary | ICD-10-CM | POA: Diagnosis not present

## 2014-09-21 DIAGNOSIS — I4891 Unspecified atrial fibrillation: Secondary | ICD-10-CM | POA: Diagnosis not present

## 2014-09-23 ENCOUNTER — Encounter: Payer: Self-pay | Admitting: Internal Medicine

## 2014-09-24 ENCOUNTER — Ambulatory Visit (INDEPENDENT_AMBULATORY_CARE_PROVIDER_SITE_OTHER): Payer: Medicare Other

## 2014-09-24 DIAGNOSIS — J309 Allergic rhinitis, unspecified: Secondary | ICD-10-CM | POA: Diagnosis not present

## 2014-09-28 DIAGNOSIS — I4891 Unspecified atrial fibrillation: Secondary | ICD-10-CM | POA: Diagnosis not present

## 2014-09-28 DIAGNOSIS — I509 Heart failure, unspecified: Secondary | ICD-10-CM | POA: Diagnosis not present

## 2014-09-28 DIAGNOSIS — I252 Old myocardial infarction: Secondary | ICD-10-CM | POA: Diagnosis not present

## 2014-09-28 DIAGNOSIS — E1140 Type 2 diabetes mellitus with diabetic neuropathy, unspecified: Secondary | ICD-10-CM | POA: Diagnosis not present

## 2014-09-28 DIAGNOSIS — L97211 Non-pressure chronic ulcer of right calf limited to breakdown of skin: Secondary | ICD-10-CM | POA: Diagnosis not present

## 2014-09-28 DIAGNOSIS — I872 Venous insufficiency (chronic) (peripheral): Secondary | ICD-10-CM | POA: Diagnosis not present

## 2014-10-04 DIAGNOSIS — I509 Heart failure, unspecified: Secondary | ICD-10-CM | POA: Diagnosis not present

## 2014-10-04 DIAGNOSIS — I252 Old myocardial infarction: Secondary | ICD-10-CM | POA: Diagnosis not present

## 2014-10-04 DIAGNOSIS — E1140 Type 2 diabetes mellitus with diabetic neuropathy, unspecified: Secondary | ICD-10-CM | POA: Diagnosis not present

## 2014-10-04 DIAGNOSIS — L97211 Non-pressure chronic ulcer of right calf limited to breakdown of skin: Secondary | ICD-10-CM | POA: Diagnosis not present

## 2014-10-04 DIAGNOSIS — I4891 Unspecified atrial fibrillation: Secondary | ICD-10-CM | POA: Diagnosis not present

## 2014-10-04 DIAGNOSIS — I872 Venous insufficiency (chronic) (peripheral): Secondary | ICD-10-CM | POA: Diagnosis not present

## 2014-10-05 ENCOUNTER — Ambulatory Visit (INDEPENDENT_AMBULATORY_CARE_PROVIDER_SITE_OTHER): Payer: Medicare Other

## 2014-10-05 DIAGNOSIS — J309 Allergic rhinitis, unspecified: Secondary | ICD-10-CM

## 2014-10-08 DIAGNOSIS — E119 Type 2 diabetes mellitus without complications: Secondary | ICD-10-CM | POA: Diagnosis not present

## 2014-10-08 DIAGNOSIS — K7460 Unspecified cirrhosis of liver: Secondary | ICD-10-CM | POA: Diagnosis not present

## 2014-10-08 DIAGNOSIS — R161 Splenomegaly, not elsewhere classified: Secondary | ICD-10-CM | POA: Diagnosis not present

## 2014-10-12 ENCOUNTER — Ambulatory Visit (INDEPENDENT_AMBULATORY_CARE_PROVIDER_SITE_OTHER): Payer: Medicare Other

## 2014-10-12 DIAGNOSIS — J309 Allergic rhinitis, unspecified: Secondary | ICD-10-CM

## 2014-10-12 DIAGNOSIS — R946 Abnormal results of thyroid function studies: Secondary | ICD-10-CM | POA: Diagnosis not present

## 2014-10-21 ENCOUNTER — Ambulatory Visit (INDEPENDENT_AMBULATORY_CARE_PROVIDER_SITE_OTHER): Payer: Medicare Other | Admitting: Internal Medicine

## 2014-10-21 ENCOUNTER — Ambulatory Visit: Payer: Medicare Other | Admitting: Internal Medicine

## 2014-10-21 ENCOUNTER — Encounter: Payer: Self-pay | Admitting: Internal Medicine

## 2014-10-21 ENCOUNTER — Ambulatory Visit (INDEPENDENT_AMBULATORY_CARE_PROVIDER_SITE_OTHER): Payer: Medicare Other

## 2014-10-21 VITALS — BP 116/64 | HR 60 | Ht 65.0 in | Wt 239.2 lb

## 2014-10-21 DIAGNOSIS — J301 Allergic rhinitis due to pollen: Secondary | ICD-10-CM

## 2014-10-21 DIAGNOSIS — K219 Gastro-esophageal reflux disease without esophagitis: Secondary | ICD-10-CM | POA: Diagnosis not present

## 2014-10-21 DIAGNOSIS — G4733 Obstructive sleep apnea (adult) (pediatric): Secondary | ICD-10-CM

## 2014-10-21 DIAGNOSIS — K7460 Unspecified cirrhosis of liver: Secondary | ICD-10-CM | POA: Diagnosis not present

## 2014-10-21 DIAGNOSIS — J309 Allergic rhinitis, unspecified: Secondary | ICD-10-CM | POA: Diagnosis not present

## 2014-10-21 NOTE — Patient Instructions (Signed)
We can change your allergy shots to get every other week.  Order- DME [facility]uey Romans- download for CPAP pressure compliance   Dx OSA

## 2014-10-21 NOTE — Progress Notes (Signed)
10/19/12- 86 yoF never smoker, followed for OSA, chronic bronchitis LOV- 09/20/10 Since last here she had lumbar spine surgery and was at a nursing facility for rehabilitation. Back pain limits walking but she is doing better, using a cane or a walker now. Notices a persistent loose cough with clear sputum. At times she may cough until she retches. She does not feel significant postnasal drainage or reflux. Continues CPAP [facility] all night every night "I have to have it". She resumed allergy shots here and says they do help. 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  01/21/13- 58 yoF never smoker, followed for OSA, chronic bronchitis, allergic rhinitis FOLLOWS YYT:[id number] on vaccine and doing well; wears CPAP every night for about 7-9 hours; pressure working well for patient as well. Had back surgery in March of 2013 , left with foot drop, so she wears ankle braces now. Continues CPAP [facility] is still says she "has to have it". Uses CPAP all night every night. Continues Flonase. Continues allergy vaccine 1:5000 GH. She had had to drop off for a while when she was at nursing home for rehabilitation. She is now rebuilding without problems.  01/21/14- 67 yoF never smoker, followed for OSA, chronic bronchitis, allergic rhinitis FOLLOWS FKC:LEXNT CPAP 9/ [facility] every night for about 7-9 hours; pressure seems to be too low. Continues allergy vaccine and doing well. Allergy vaccine 1:500 GH - misses frequently due to transportation. She says she can do better now and knows that the shots work for her so she does not want to stop. Back pain, hurts to walk, asks handicapped parking She has one CPAP machine on [employer] and another set on 9, which is the one she is using. This does not feel like enough pressure. Huey Romans says her machine is from 2003 and not working correctly.  10/21/14- 67 yoF never smoker, followed for OSA, chronic bronchitis, allergic rhinitis, complicated by nonalcoholic  cirrhosis/ Dr Melina [facility] Allergy vaccine 1:10 [place]    CPAP Auto/ [facility] new machine since LOV FOLLOWS FOR: Still on vaccine and doing well; wears CPAP every night for about 7 hours; DME is [facility]. Mask fit not good, pressure seems to high but she says she sleeps soundly.  ROS-see HPI Constitutional:   No-   weight loss, night sweats, fevers, chills, fatigue, lassitude. HEENT:   No-  headaches, difficulty swallowing, tooth/dental problems, sore throat,       No-  sneezing, itching, ear ache, nasal congestion, post nasal drip,  CV:  No-   chest pain, orthopnea, PND, swelling in lower extremities, anasarca, dizziness, palpitations Resp: No-   shortness of breath with exertion or at rest.             +productive cough,  No non-productive cough,  No- coughing up of blood.              No-   change in color of mucus.  No- wheezing.   Skin: No-   rash or lesions. GI:  No-   heartburn, indigestion, abdominal pain, nausea, vomiting,  GU:  MS:  + joint pain or swelling. + back pain. Neuro-     nothing unusual Psych:  No- change in mood or affect. No depression or anxiety.  No memory loss.  OBJ- Physical Exam General- Alert, Oriented, Affect-appropriate, Distress- none acute, overweight Skin- rash-none, lesions- none, excoriation- none, + ecchymoses on arms Lymphadenopathy- none Head- atraumatic            Eyes- Gross vision intact, PERRLA, conjunctivae  and secretions clear            Ears- Hearing, canals-normal            Nose- Clear, no-Septal dev, mucus, polyps, erosion, perforation             Throat- Mallampati II-III , mucosa clear , drainage- none, tonsils- atrophic Neck- flexible , trachea midline, no stridor , thyroid nl, carotid no bruit Chest - symmetrical excursion , unlabored           Heart/CV- RRR , no murmur , no gallop  , no rub, nl s1 s2                           - JVD- none , edema- none, stasis changes- none, varices- none           Lung- clear to P&A, wheeze- none, cough- none  , dullness-none, rub- none           Chest wall-  Abd-  Br/ Gen/ Rectal- Not done, not indicated Extrem- cyanosis- none, clubbing, none, atrophy- none, strength- nl. +walker. Neuro- grossly intact to observation

## 2014-10-23 NOTE — Assessment & Plan Note (Signed)
She describes good compliance. Pressure may need adjustment. Plan-download from [facility] to assess pressure. Weight loss encouraged

## 2014-10-23 NOTE — Assessment & Plan Note (Signed)
She continues allergy vaccine at 1:10. We discussed goals and compared with antihistamines and nasal sprays

## 2014-10-25 ENCOUNTER — Ambulatory Visit: Payer: Medicare Other

## 2014-10-27 ENCOUNTER — Ambulatory Visit: Payer: Medicare Other

## 2014-11-01 ENCOUNTER — Ambulatory Visit: Payer: Medicare Other

## 2014-11-04 ENCOUNTER — Ambulatory Visit (INDEPENDENT_AMBULATORY_CARE_PROVIDER_SITE_OTHER): Payer: Medicare Other

## 2014-11-04 DIAGNOSIS — J309 Allergic rhinitis, unspecified: Secondary | ICD-10-CM | POA: Diagnosis not present

## 2014-11-08 ENCOUNTER — Ambulatory Visit: Payer: Medicare Other

## 2014-11-09 DIAGNOSIS — F3341 Major depressive disorder, recurrent, in partial remission: Secondary | ICD-10-CM | POA: Diagnosis not present

## 2014-11-17 ENCOUNTER — Ambulatory Visit: Payer: Medicare Other

## 2014-11-18 ENCOUNTER — Ambulatory Visit: Payer: Medicare Other

## 2014-11-24 ENCOUNTER — Ambulatory Visit (INDEPENDENT_AMBULATORY_CARE_PROVIDER_SITE_OTHER): Payer: Medicare Other

## 2014-11-24 DIAGNOSIS — J309 Allergic rhinitis, unspecified: Secondary | ICD-10-CM

## 2014-11-26 ENCOUNTER — Ambulatory Visit: Payer: Medicare Other

## 2014-11-30 DIAGNOSIS — R946 Abnormal results of thyroid function studies: Secondary | ICD-10-CM | POA: Diagnosis not present

## 2014-12-07 ENCOUNTER — Ambulatory Visit (INDEPENDENT_AMBULATORY_CARE_PROVIDER_SITE_OTHER): Payer: Medicare Other

## 2014-12-07 DIAGNOSIS — J309 Allergic rhinitis, unspecified: Secondary | ICD-10-CM | POA: Diagnosis not present

## 2014-12-10 ENCOUNTER — Ambulatory Visit: Payer: Medicare Other

## 2014-12-14 DIAGNOSIS — E780 Pure hypercholesterolemia: Secondary | ICD-10-CM | POA: Diagnosis not present

## 2014-12-14 DIAGNOSIS — Z1239 Encounter for other screening for malignant neoplasm of breast: Secondary | ICD-10-CM | POA: Diagnosis not present

## 2014-12-14 DIAGNOSIS — M25511 Pain in right shoulder: Secondary | ICD-10-CM | POA: Diagnosis not present

## 2014-12-14 DIAGNOSIS — E1140 Type 2 diabetes mellitus with diabetic neuropathy, unspecified: Secondary | ICD-10-CM | POA: Diagnosis not present

## 2014-12-14 DIAGNOSIS — E038 Other specified hypothyroidism: Secondary | ICD-10-CM | POA: Diagnosis not present

## 2014-12-14 DIAGNOSIS — I4891 Unspecified atrial fibrillation: Secondary | ICD-10-CM | POA: Diagnosis not present

## 2014-12-14 DIAGNOSIS — I10 Essential (primary) hypertension: Secondary | ICD-10-CM | POA: Diagnosis not present

## 2014-12-16 DIAGNOSIS — K7460 Unspecified cirrhosis of liver: Secondary | ICD-10-CM | POA: Diagnosis not present

## 2014-12-16 DIAGNOSIS — E119 Type 2 diabetes mellitus without complications: Secondary | ICD-10-CM | POA: Diagnosis not present

## 2014-12-16 DIAGNOSIS — R609 Edema, unspecified: Secondary | ICD-10-CM | POA: Diagnosis not present

## 2014-12-16 DIAGNOSIS — I482 Chronic atrial fibrillation: Secondary | ICD-10-CM | POA: Diagnosis not present

## 2014-12-16 DIAGNOSIS — I2510 Atherosclerotic heart disease of native coronary artery without angina pectoris: Secondary | ICD-10-CM | POA: Diagnosis not present

## 2014-12-16 DIAGNOSIS — E785 Hyperlipidemia, unspecified: Secondary | ICD-10-CM | POA: Diagnosis not present

## 2014-12-16 DIAGNOSIS — I10 Essential (primary) hypertension: Secondary | ICD-10-CM | POA: Diagnosis not present

## 2014-12-20 DIAGNOSIS — Z1231 Encounter for screening mammogram for malignant neoplasm of breast: Secondary | ICD-10-CM | POA: Diagnosis not present

## 2014-12-21 ENCOUNTER — Ambulatory Visit (INDEPENDENT_AMBULATORY_CARE_PROVIDER_SITE_OTHER): Payer: Medicare Other

## 2014-12-21 DIAGNOSIS — J309 Allergic rhinitis, unspecified: Secondary | ICD-10-CM

## 2014-12-24 ENCOUNTER — Ambulatory Visit (INDEPENDENT_AMBULATORY_CARE_PROVIDER_SITE_OTHER): Payer: Medicare Other

## 2014-12-24 DIAGNOSIS — E1140 Type 2 diabetes mellitus with diabetic neuropathy, unspecified: Secondary | ICD-10-CM | POA: Diagnosis not present

## 2014-12-24 DIAGNOSIS — Q828 Other specified congenital malformations of skin: Secondary | ICD-10-CM | POA: Diagnosis not present

## 2014-12-24 DIAGNOSIS — B351 Tinea unguium: Secondary | ICD-10-CM

## 2014-12-24 DIAGNOSIS — M79676 Pain in unspecified toe(s): Secondary | ICD-10-CM | POA: Diagnosis not present

## 2014-12-24 NOTE — Progress Notes (Signed)
   Subjective:    Patient ID: [name] [name], female    DOB: November 15, 1946, 69 y.o.   MRN: [id number]  HPI  I NEED MY TOENAILS TRIMMED UP   Review of Systems no new findings or systemic changes     Objective:   Physical Exam 69 year old white female well-developed well-nourished oriented 3 presents at this time for follow-up diabetic foot and nail care. Patient walks with the assistance of a walker an AFO brace on her left side. 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 Her extremity objective findings reveal vascular status to be intact although diminished DP and PT one over 4 bilateral capillary refill time 3 seconds all digits epicritic and proprioceptive sensations grossly diminished on Semmes Weinstein the forefoot digits and arch with notable muscle strength and weakness and dropfoot on left foot. Is normal plantar response DTRs not listed dermatologically skin color pigment normal hair growth absent there is keratoses sub-first right with pinch callus and open wound or ulcer is noted nails thick brittle [place] dystrophic friable 1 through 5 on the right 2 through 5 on the left following debridement the first right and second and fourth left are treated with limited cane and Neosporin following debridement patient is on aspirin 3 times bleed relatively 0 nails thick criptotic gratified thick and painful both on palpation and debridement and ambulation.       Assessment & Plan:  Assessment diabetes with history peripheral neuropathy and angiopathy. Mycotic painful nails debrided and the presence of diabetes and complications was pain symptomology nails debrided 9 also debrided still keratotic lesion sub-1 right return in 3 months for follow-up and palliative care in the future as needed next  [employer] DPM

## 2014-12-24 NOTE — Patient Instructions (Signed)
Diabetes and Foot Care Diabetes may cause you to have problems because of poor blood supply (circulation) to your feet and legs. This may cause the skin on your feet to become thinner, break easier, and heal more slowly. Your skin may become dry, and the skin may peel and crack. You may also have nerve damage in your legs and feet causing decreased feeling in them. You may not notice minor injuries to your feet that could lead to infections or more serious problems. Taking care of your feet is one of the most important things you can do for yourself.  HOME CARE INSTRUCTIONS  Wear shoes at all times, even in the house. Do not go barefoot. Bare feet are easily injured.  Check your feet daily for blisters, cuts, and redness. If you cannot see the bottom of your feet, use a mirror or ask someone for help.  Wash your feet with warm water (do not use hot water) and mild soap. Then pat your feet and the areas between your toes until they are completely dry. Do not soak your feet as this can dry your skin.  Apply a moisturizing lotion or petroleum jelly (that does not contain alcohol and is unscented) to the skin on your feet and to dry, brittle toenails. Do not apply lotion between your toes.  Trim your toenails straight across. Do not dig under them or around the cuticle. File the edges of your nails with an emery board or nail file.  Do not cut corns or calluses or try to remove them with medicine.  Wear clean socks or stockings every day. Make sure they are not too tight. Do not wear knee-high stockings since they may decrease blood flow to your legs.  Wear shoes that fit properly and have enough cushioning. To break in new shoes, wear them for just a few hours a day. This prevents you from injuring your feet. Always look in your shoes before you put them on to be sure there are no objects inside.  Do not cross your legs. This may decrease the blood flow to your feet.  If you find a minor scrape,  cut, or break in the skin on your feet, keep it and the skin around it clean and dry. These areas may be cleansed with mild soap and water. Do not cleanse the area with peroxide, alcohol, or iodine.  When you remove an adhesive bandage, be sure not to damage the skin around it.  If you have a wound, look at it several times a day to make sure it is healing.  Do not use heating pads or hot water bottles. They may burn your skin. If you have lost feeling in your feet or legs, you may not know it is happening until it is too late.  Make sure your health care provider performs a complete foot exam at least annually or more often if you have foot problems. Report any cuts, sores, or bruises to your health care provider immediately. SEEK MEDICAL CARE IF:   You have an injury that is not healing.  You have cuts or breaks in the skin.  You have an ingrown nail.  You notice redness on your legs or feet.  You feel burning or tingling in your legs or feet.  You have pain or cramps in your legs and feet.  Your legs or feet are numb.  Your feet always feel cold. SEEK IMMEDIATE MEDICAL CARE IF:   There is increasing redness,   swelling, or pain in or around a wound.  There is a red line that goes up your leg.  Pus is coming from a wound.  You develop a fever or as directed by your health care provider.  You notice a bad smell coming from an ulcer or wound. Document Released: 11/02/2000 Document Revised: 07/08/2013 Document Reviewed: 04/14/2013 [employer] Patient Information 2015 [employer]. This information is not intended to replace advice given to you by your health care provider. Make sure you discuss any questions you have with your health care provider.  

## 2015-01-04 ENCOUNTER — Ambulatory Visit: Payer: Medicare Other

## 2015-01-14 ENCOUNTER — Ambulatory Visit (INDEPENDENT_AMBULATORY_CARE_PROVIDER_SITE_OTHER): Payer: Medicare Other

## 2015-01-14 DIAGNOSIS — J309 Allergic rhinitis, unspecified: Secondary | ICD-10-CM | POA: Diagnosis not present

## 2015-01-14 DIAGNOSIS — R946 Abnormal results of thyroid function studies: Secondary | ICD-10-CM | POA: Diagnosis not present

## 2015-01-25 ENCOUNTER — Ambulatory Visit: Payer: BLUE CROSS/BLUE SHIELD

## 2015-02-03 ENCOUNTER — Ambulatory Visit: Payer: BLUE CROSS/BLUE SHIELD

## 2015-02-08 DIAGNOSIS — E11329 Type 2 diabetes mellitus with mild nonproliferative diabetic retinopathy without macular edema: Secondary | ICD-10-CM | POA: Diagnosis not present

## 2015-02-08 DIAGNOSIS — F330 Major depressive disorder, recurrent, mild: Secondary | ICD-10-CM | POA: Diagnosis not present

## 2015-03-08 DIAGNOSIS — F330 Major depressive disorder, recurrent, mild: Secondary | ICD-10-CM | POA: Diagnosis not present

## 2015-03-08 DIAGNOSIS — F3341 Major depressive disorder, recurrent, in partial remission: Secondary | ICD-10-CM | POA: Diagnosis not present

## 2015-03-14 DIAGNOSIS — I10 Essential (primary) hypertension: Secondary | ICD-10-CM | POA: Diagnosis not present

## 2015-03-14 DIAGNOSIS — E038 Other specified hypothyroidism: Secondary | ICD-10-CM | POA: Diagnosis not present

## 2015-03-14 DIAGNOSIS — E1140 Type 2 diabetes mellitus with diabetic neuropathy, unspecified: Secondary | ICD-10-CM | POA: Diagnosis not present

## 2015-03-14 DIAGNOSIS — M797 Fibromyalgia: Secondary | ICD-10-CM | POA: Diagnosis not present

## 2015-03-14 DIAGNOSIS — E780 Pure hypercholesterolemia: Secondary | ICD-10-CM | POA: Diagnosis not present

## 2015-03-25 ENCOUNTER — Ambulatory Visit: Payer: Medicare Other | Admitting: Podiatrist

## 2015-03-25 ENCOUNTER — Ambulatory Visit (INDEPENDENT_AMBULATORY_CARE_PROVIDER_SITE_OTHER): Payer: Medicare Other | Admitting: Podiatrist

## 2015-03-25 ENCOUNTER — Encounter: Payer: Self-pay | Admitting: Podiatrist

## 2015-03-25 VITALS — BP 143/73 | HR 65 | Resp 18

## 2015-03-25 DIAGNOSIS — B351 Tinea unguium: Secondary | ICD-10-CM | POA: Diagnosis not present

## 2015-03-25 DIAGNOSIS — M79676 Pain in unspecified toe(s): Secondary | ICD-10-CM

## 2015-03-25 DIAGNOSIS — E1140 Type 2 diabetes mellitus with diabetic neuropathy, unspecified: Secondary | ICD-10-CM

## 2015-03-25 DIAGNOSIS — Q828 Other specified congenital malformations of skin: Secondary | ICD-10-CM

## 2015-03-25 NOTE — Progress Notes (Signed)
HPI: Patient presents today for follow up of diabetic foot and nail care. Past medical history, meds, and allergies reviewed.    Objective:   Objective:  Patients chart is reviewed.  Vascular status reveals pedal pulses noted at  1 out of 4 dp and pt bilateral .  Neurological sensation is Decreased to [employer] monofilament bilateral at 1/5 sites bilateral.  Dermatological exam reveals  absence of pre ulcerative/ hyperkeratotic lesions.   Toenails are elongated, incurvated, discolored, dystrophic with ingrown deformity present.    Assessment: Diabetes with Neuropathy/Angiopathy , Ingrown nail deformity,   Plan: Discussed treatment options and alternatives. Debrided nails without complication.   Return appointment recommended at routine intervals of 3 months.   Trudie Buckler, DPM

## 2015-03-30 DIAGNOSIS — M150 Primary generalized (osteo)arthritis: Secondary | ICD-10-CM | POA: Diagnosis not present

## 2015-03-30 DIAGNOSIS — M5136 Other intervertebral disc degeneration, lumbar region: Secondary | ICD-10-CM | POA: Diagnosis not present

## 2015-03-30 DIAGNOSIS — Z7952 Long term (current) use of systemic steroids: Secondary | ICD-10-CM | POA: Diagnosis not present

## 2015-03-30 DIAGNOSIS — M797 Fibromyalgia: Secondary | ICD-10-CM | POA: Diagnosis not present

## 2015-04-22 ENCOUNTER — Ambulatory Visit: Payer: Medicare Other | Admitting: Internal Medicine

## 2015-04-25 ENCOUNTER — Ambulatory Visit (INDEPENDENT_AMBULATORY_CARE_PROVIDER_SITE_OTHER): Payer: Medicare Other | Admitting: Internal Medicine

## 2015-04-25 ENCOUNTER — Encounter: Payer: Self-pay | Admitting: Internal Medicine

## 2015-04-25 VITALS — BP 118/62 | HR 55 | Ht 65.0 in | Wt 228.0 lb

## 2015-04-25 DIAGNOSIS — G4733 Obstructive sleep apnea (adult) (pediatric): Secondary | ICD-10-CM

## 2015-04-25 DIAGNOSIS — J301 Allergic rhinitis due to pollen: Secondary | ICD-10-CM | POA: Diagnosis not present

## 2015-04-25 DIAGNOSIS — J40 Bronchitis, not specified as acute or chronic: Secondary | ICD-10-CM

## 2015-04-25 NOTE — Progress Notes (Signed)
10/19/12- 11 yoF never smoker, followed for OSA, chronic bronchitis LOV- 09/20/10 Since last here she had lumbar spine surgery and was at a nursing facility for rehabilitation. Back pain limits walking but she is doing better, using a cane or a walker now. Notices a persistent loose cough with clear sputum. At times she may cough until she retches. She does not feel significant postnasal drainage or reflux. Continues CPAP AutoSet/[facility] all night every night "I have to have it". She resumed allergy shots here and says they do help. 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  01/21/13- 64 yoF never smoker, followed for OSA, chronic bronchitis, allergic rhinitis FOLLOWS [id number] on vaccine and doing well; wears CPAP every night for about 7-9 hours; pressure working well for patient as well. Had back surgery in March of 2013 , left with foot drop, so she wears ankle braces now. Continues CPAP AutoPap/[facility] is still says she "has to have it". Uses CPAP all night every night. Continues Flonase. Continues allergy vaccine 1:5000 GH. She had had to drop off for a while when she was at nursing home for rehabilitation. She is now rebuilding without problems.  01/21/14- 67 yoF never smoker, followed for OSA, chronic bronchitis, allergic rhinitis FOLLOWS [id number] CPAP 9/ [facility] every night for about 7-9 hours; pressure seems to be too low. Continues allergy vaccine and doing well. Allergy vaccine 1:500 GH - misses frequently due to transportation. She says she can do better now and knows that the shots work for her so she does not want to stop. Back pain, hurts to walk, asks handicapped parking She has one CPAP machine on AutoPap and another set on 9, which is the one she is using. This does not feel like enough pressure. Huey Romans says her machine is from 2003 and not working correctly.  10/21/14- 67 yoF never smoker, followed for OSA, chronic bronchitis, allergic rhinitis, complicated by nonalcoholic  cirrhosis/ Dr Melina Copa GI Allergy vaccine 1:10 [place]    CPAP Auto/ [facility] new machine since LOV FOLLOWS FOR: Still on vaccine and doing well; wears CPAP every night for about 7 hours; DME is [facility]. Mask fit not good, pressure seems too high but she says she sleeps soundly.  04/25/15- 68 yoF never smoker, followed for OSA, chronic bronchitis, allergic rhinitis, complicated by nonalcoholic cirrhosis/ Dr Melina Copa GI Allergy vaccine was dc'd   CPAP Auto/ [facility] Romans prefers her old mask for better fit. We discussed how to work with her DME company. Aware that she kicks and is restless in sleep. Wears CPAP nightly ~6 hrs. Since she quit allergy shot she is doing ok, still a little sneezy but, nothing she cant handle  ROS-see HPI Constitutional:   No-   weight loss, night sweats, fevers, chills, fatigue, lassitude. HEENT:   No-  headaches, difficulty swallowing, tooth/dental problems, sore throat,       No-  sneezing, itching, ear ache, nasal congestion, post nasal drip,  CV:  No-   chest pain, orthopnea, PND, swelling in lower extremities, anasarca, dizziness, palpitations Resp: No-   shortness of breath with exertion or at rest.             productive cough,  No non-productive cough,  No- coughing up of blood.              No-   change in color of mucus.  No- wheezing.   Skin: No-   rash or lesions. GI:  No-   heartburn, indigestion, abdominal pain, nausea, vomiting,  GU:  MS:  + joint pain or swelling. + back pain. Neuro-     nothing unusual Psych:  No- change in mood or affect. No depression or anxiety.  No memory loss.  OBJ- Physical Exam General- Alert, Oriented, Affect-appropriate, Distress- none acute, overweight Skin-  + ecchymoses on arms Lymphadenopathy- none Head- atraumatic            Eyes- Gross vision intact, PERRLA, conjunctivae and secretions clear            Ears- Hearing, canals-normal            Nose- Clear, no-Septal dev, mucus, polyps, erosion, perforation             Throat-  Mallampati II-III , mucosa clear , drainage- none, tonsils- atrophic Neck- flexible , trachea midline, no stridor , thyroid nl, carotid no bruit Chest - symmetrical excursion , unlabored           Heart/CV- RRR , no murmur , no gallop  , no rub, nl s1 s2                           - JVD- none , edema- none, stasis changes- none, varices- none           Lung- clear to P&A, wheeze- none, cough- none , dullness-none, rub- none           Chest wall-  Abd-  Br/ Gen/ Rectal- Not done, not indicated Extrem- cyanosis- none, clubbing, none, atrophy- none, strength- nl. +walker.   + Bilateral ankle brace Neuro- grossly intact to observation

## 2015-04-25 NOTE — Patient Instructions (Signed)
We can continue CPAP auto/[facility]  We can continue present meds

## 2015-04-26 NOTE — Assessment & Plan Note (Signed)
Good compliance and control. She states that she loves her CPAP and wouldn't try to sleep without it. She needs help from DME replacing old mask

## 2015-04-26 NOTE — Assessment & Plan Note (Signed)
Wheezy bronchitis generally well controlled

## 2015-04-26 NOTE — Assessment & Plan Note (Signed)
Acceptable symptom control this spring, off allergy vaccine, using antihistamines.

## 2015-05-04 DIAGNOSIS — K7460 Unspecified cirrhosis of liver: Secondary | ICD-10-CM | POA: Diagnosis not present

## 2015-05-04 DIAGNOSIS — K7581 Nonalcoholic steatohepatitis (NASH): Secondary | ICD-10-CM | POA: Diagnosis not present

## 2015-05-04 DIAGNOSIS — R161 Splenomegaly, not elsewhere classified: Secondary | ICD-10-CM | POA: Diagnosis not present

## 2015-05-05 DIAGNOSIS — F3341 Major depressive disorder, recurrent, in partial remission: Secondary | ICD-10-CM | POA: Diagnosis not present

## 2015-05-13 ENCOUNTER — Encounter: Payer: Self-pay | Admitting: Internal Medicine

## 2015-05-19 DIAGNOSIS — K219 Gastro-esophageal reflux disease without esophagitis: Secondary | ICD-10-CM | POA: Diagnosis not present

## 2015-05-19 DIAGNOSIS — K7460 Unspecified cirrhosis of liver: Secondary | ICD-10-CM | POA: Diagnosis not present

## 2015-06-02 DIAGNOSIS — F3341 Major depressive disorder, recurrent, in partial remission: Secondary | ICD-10-CM | POA: Diagnosis not present

## 2015-06-13 DIAGNOSIS — L820 Inflamed seborrheic keratosis: Secondary | ICD-10-CM | POA: Diagnosis not present

## 2015-06-13 DIAGNOSIS — E780 Pure hypercholesterolemia: Secondary | ICD-10-CM | POA: Diagnosis not present

## 2015-06-13 DIAGNOSIS — G4730 Sleep apnea, unspecified: Secondary | ICD-10-CM | POA: Diagnosis not present

## 2015-06-13 DIAGNOSIS — I10 Essential (primary) hypertension: Secondary | ICD-10-CM | POA: Diagnosis not present

## 2015-06-13 DIAGNOSIS — E1140 Type 2 diabetes mellitus with diabetic neuropathy, unspecified: Secondary | ICD-10-CM | POA: Diagnosis not present

## 2015-06-13 DIAGNOSIS — E038 Other specified hypothyroidism: Secondary | ICD-10-CM | POA: Diagnosis not present

## 2015-06-13 DIAGNOSIS — Z79899 Other long term (current) drug therapy: Secondary | ICD-10-CM | POA: Diagnosis not present

## 2015-06-20 DIAGNOSIS — B078 Other viral warts: Secondary | ICD-10-CM | POA: Diagnosis not present

## 2015-06-20 DIAGNOSIS — L820 Inflamed seborrheic keratosis: Secondary | ICD-10-CM | POA: Diagnosis not present

## 2015-06-21 DIAGNOSIS — Z23 Encounter for immunization: Secondary | ICD-10-CM | POA: Diagnosis not present

## 2015-06-21 DIAGNOSIS — Z6838 Body mass index (BMI) 38.0-38.9, adult: Secondary | ICD-10-CM | POA: Diagnosis not present

## 2015-06-21 DIAGNOSIS — E1142 Type 2 diabetes mellitus with diabetic polyneuropathy: Secondary | ICD-10-CM | POA: Diagnosis not present

## 2015-07-08 ENCOUNTER — Encounter: Payer: Self-pay | Admitting: Internal Medicine

## 2015-07-18 DIAGNOSIS — L820 Inflamed seborrheic keratosis: Secondary | ICD-10-CM | POA: Diagnosis not present

## 2015-07-18 DIAGNOSIS — B372 Candidiasis of skin and nail: Secondary | ICD-10-CM | POA: Diagnosis not present

## 2015-08-03 DIAGNOSIS — F3342 Major depressive disorder, recurrent, in full remission: Secondary | ICD-10-CM | POA: Diagnosis not present

## 2015-08-16 DIAGNOSIS — E11329 Type 2 diabetes mellitus with mild nonproliferative diabetic retinopathy without macular edema: Secondary | ICD-10-CM | POA: Diagnosis not present

## 2015-08-16 DIAGNOSIS — H524 Presbyopia: Secondary | ICD-10-CM | POA: Diagnosis not present

## 2015-09-13 DIAGNOSIS — E1140 Type 2 diabetes mellitus with diabetic neuropathy, unspecified: Secondary | ICD-10-CM | POA: Diagnosis not present

## 2015-09-13 DIAGNOSIS — E038 Other specified hypothyroidism: Secondary | ICD-10-CM | POA: Diagnosis not present

## 2015-09-13 DIAGNOSIS — E7800 Pure hypercholesterolemia, unspecified: Secondary | ICD-10-CM | POA: Diagnosis not present

## 2015-09-13 DIAGNOSIS — I10 Essential (primary) hypertension: Secondary | ICD-10-CM | POA: Diagnosis not present

## 2015-09-13 DIAGNOSIS — Z23 Encounter for immunization: Secondary | ICD-10-CM | POA: Diagnosis not present

## 2015-09-13 DIAGNOSIS — M797 Fibromyalgia: Secondary | ICD-10-CM | POA: Diagnosis not present

## 2015-10-18 DIAGNOSIS — H05312 Atrophy of left orbit: Secondary | ICD-10-CM | POA: Diagnosis not present

## 2015-10-18 DIAGNOSIS — K7460 Unspecified cirrhosis of liver: Secondary | ICD-10-CM | POA: Diagnosis not present

## 2015-10-25 ENCOUNTER — Encounter: Payer: Self-pay | Admitting: Internal Medicine

## 2015-10-25 ENCOUNTER — Ambulatory Visit (INDEPENDENT_AMBULATORY_CARE_PROVIDER_SITE_OTHER): Payer: Medicare Other | Admitting: Internal Medicine

## 2015-10-25 VITALS — BP 124/72 | HR 71 | Ht 65.0 in | Wt 228.0 lb

## 2015-10-25 DIAGNOSIS — G4733 Obstructive sleep apnea (adult) (pediatric): Secondary | ICD-10-CM | POA: Diagnosis not present

## 2015-10-25 NOTE — Patient Instructions (Signed)
We can continue CPAP auto/ [facility]  Please call as needed

## 2015-10-25 NOTE — Assessment & Plan Note (Signed)
She is describing lack of energy rather than sleepiness. I think appropriately, she attributes it to her poor general health. Lack of activity and history depression are additional factors.

## 2015-10-25 NOTE — Progress Notes (Signed)
10/19/12- 64 yoF never smoker, followed for OSA, chronic bronchitis LOV- 09/20/10 Since last here she had lumbar spine surgery and was at a nursing facility for rehabilitation. Back pain limits walking but she is doing better, using a cane or a walker now. Notices a persistent loose cough with clear sputum. At times she may cough until she retches. She does not feel significant postnasal drainage or reflux. Continues CPAP [facility] all night every night "I have to have it". She resumed allergy shots here and says they do help. 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  01/21/13- 36 yoF never smoker, followed for OSA, chronic bronchitis, allergic rhinitis FOLLOWS [id number] on vaccine and doing well; wears CPAP every night for about 7-9 hours; pressure working well for patient as well. Had back surgery in March of 2013 , left with foot drop, so she wears ankle braces now. Continues CPAP [facility]ap/[facility] is still says she "has to have it". Uses CPAP all night every night. Continues Flonase. Continues allergy vaccine 1:5000 GH. She had had to drop off for a while when she was at nursing home for rehabilitation. She is now rebuilding without problems.  01/21/14- 67 yoF never smoker, followed for OSA, chronic bronchitis, allergic rhinitis FOLLOWS [id number] CPAP 9/ [facility] every night for about 7-9 hours; pressure seems to be too low. Continues allergy vaccine and doing well. Allergy vaccine 1:500 GH - misses frequently due to transportation. She says she can do better now and knows that the shots work for her so she does not want to stop. Back pain, hurts to walk, asks handicapped parking She has one CPAP machine on AutoP[facility] and another set on 9, which is the one she is using. This does not feel like enough pressure. Huey Romans says her machine is from 2003 and not working correctly.  10/21/14- 67 yoF never smoker, followed for OSA, chronic bronchitis, allergic rhinitis, complicated by nonalcoholic  cirrhosis/ Dr Melina Copa GI Allergy vaccine 1:10 [place]    CPAP Auto/ [facility] new machine since LOV FOLLOWS FOR: Still on vaccine and doing well; wears CPAP every night for about 7 hours; DME is [facility]. Mask fit not good, pressure seems too high but she says she sleeps soundly.  04/25/15- 68 yoF never smoker, followed for OSA, chronic bronchitis, allergic rhinitis, complicated by nonalcoholic cirrhosis/ Dr Melina Copa GI Allergy vaccine was dc'd   CPAP Auto/ [facility] Romans prefers her old mask for better fit. We discussed how to work with her DME company. Aware that she kicks and is restless in sleep. Wears CPAP nightly ~6 hrs. Since she quit allergy shot she is doing ok, still a little sneezy but, nothing she cant handle  10/25/2015-69 year old female never smoker followed for OSA, chronic bronchitis, allergic rhinitis, complicated by nonalcoholic cirrhosis/[name] [facility] CPAP auto/[facility] Pt reports she is wearing CPAP nightly. Pt reports since stopping the allergy vaccine she has had some runny nose, dry mouth.  Denies snoring through CPAP and sleeping well. She blames some residual daytime tiredness on her poor general health and multiple comorbidities. She is asking for pain medication and I deferred to her rheumatologist  ROS-see HPI Constitutional:   No-   weight loss, night sweats, fevers, chills, + fatigue, lassitude. HEENT:   No-  headaches, difficulty swallowing, tooth/dental problems, sore throat,       No-  sneezing, itching, ear ache, nasal congestion, post nasal drip,  CV:  No-   chest pain, orthopnea, PND, swelling in lower extremities, anasarca, dizziness, palpitations Resp: No-   shortness  of breath with exertion or at rest.             productive cough,  No non-productive cough,  No- coughing up of blood.              No-   change in color of mucus.  No- wheezing.   Skin: No-   rash or lesions. GI:  No-   heartburn, indigestion, abdominal pain, nausea, vomiting,  GU:  MS:  + joint pain or swelling. +  back pain. Neuro-     nothing unusual Psych:  No- change in mood or affect. No depression or anxiety.  No memory loss.  OBJ- Physical Exam General- Alert, Oriented, Affect-appropriate, Distress- none acute, + overweight Skin-  + ecchymoses on arms Lymphadenopathy- none Head- atraumatic            Eyes- Gross vision intact, PERRLA, conjunctivae and secretions clear            Ears- Hearing, canals-normal            Nose- Clear, no-Septal dev, mucus, polyps, erosion, perforation             Throat- Mallampati II-III , mucosa clear , drainage- none, tonsils- atrophic Neck- flexible , trachea midline, no stridor , thyroid nl, carotid no bruit Chest - symmetrical excursion , unlabored           Heart/CV- RRR , no murmur , no gallop  , no rub, nl s1 s2                           - JVD- none , edema- none, stasis changes- none, varices- none           Lung- clear to P&A, wheeze- none, cough- none , dullness-none, rub- none           Chest wall-  Abd-  Br/ Gen/ Rectal- Not done, not indicated Extrem- cyanosis- none, clubbing, none, atrophy- none, strength- nl. +walker. Neuro- grossly intact to observation

## 2015-10-25 NOTE — Assessment & Plan Note (Addendum)
She describes good compliance and control, quite satisfied with CPAP which she reports using all night every night. Plan-continue AutoPap, encourage weight loss

## 2015-11-17 DIAGNOSIS — K7460 Unspecified cirrhosis of liver: Secondary | ICD-10-CM | POA: Diagnosis not present

## 2015-11-17 DIAGNOSIS — K7581 Nonalcoholic steatohepatitis (NASH): Secondary | ICD-10-CM | POA: Diagnosis not present

## 2015-11-17 DIAGNOSIS — R161 Splenomegaly, not elsewhere classified: Secondary | ICD-10-CM | POA: Diagnosis not present

## 2015-11-17 DIAGNOSIS — R935 Abnormal findings on diagnostic imaging of other abdominal regions, including retroperitoneum: Secondary | ICD-10-CM | POA: Diagnosis not present

## 2015-11-20 HISTORY — PX: CATARACT EXTRACTION W/ INTRAOCULAR LENS  IMPLANT, BILATERAL: SHX1307

## 2015-11-24 DIAGNOSIS — K219 Gastro-esophageal reflux disease without esophagitis: Secondary | ICD-10-CM | POA: Diagnosis not present

## 2015-11-24 DIAGNOSIS — K7460 Unspecified cirrhosis of liver: Secondary | ICD-10-CM | POA: Diagnosis not present

## 2015-12-07 DIAGNOSIS — F3342 Major depressive disorder, recurrent, in full remission: Secondary | ICD-10-CM | POA: Diagnosis not present

## 2015-12-14 DIAGNOSIS — E038 Other specified hypothyroidism: Secondary | ICD-10-CM | POA: Diagnosis not present

## 2015-12-14 DIAGNOSIS — I4891 Unspecified atrial fibrillation: Secondary | ICD-10-CM | POA: Diagnosis not present

## 2015-12-14 DIAGNOSIS — E1140 Type 2 diabetes mellitus with diabetic neuropathy, unspecified: Secondary | ICD-10-CM | POA: Diagnosis not present

## 2015-12-14 DIAGNOSIS — I10 Essential (primary) hypertension: Secondary | ICD-10-CM | POA: Diagnosis not present

## 2015-12-14 DIAGNOSIS — G4730 Sleep apnea, unspecified: Secondary | ICD-10-CM | POA: Diagnosis not present

## 2015-12-14 DIAGNOSIS — E7800 Pure hypercholesterolemia, unspecified: Secondary | ICD-10-CM | POA: Diagnosis not present

## 2016-02-09 DIAGNOSIS — Z1231 Encounter for screening mammogram for malignant neoplasm of breast: Secondary | ICD-10-CM | POA: Diagnosis not present

## 2016-02-09 DIAGNOSIS — R921 Mammographic calcification found on diagnostic imaging of breast: Secondary | ICD-10-CM | POA: Diagnosis not present

## 2016-02-14 DIAGNOSIS — E113293 Type 2 diabetes mellitus with mild nonproliferative diabetic retinopathy without macular edema, bilateral: Secondary | ICD-10-CM | POA: Diagnosis not present

## 2016-02-29 DIAGNOSIS — E785 Hyperlipidemia, unspecified: Secondary | ICD-10-CM

## 2016-02-29 DIAGNOSIS — I2510 Atherosclerotic heart disease of native coronary artery without angina pectoris: Secondary | ICD-10-CM

## 2016-02-29 HISTORY — DX: Atherosclerotic heart disease of native coronary artery without angina pectoris: I25.10

## 2016-02-29 HISTORY — DX: Hyperlipidemia, unspecified: E78.5

## 2016-03-01 DIAGNOSIS — I2510 Atherosclerotic heart disease of native coronary artery without angina pectoris: Secondary | ICD-10-CM | POA: Diagnosis not present

## 2016-03-01 DIAGNOSIS — Z6839 Body mass index (BMI) 39.0-39.9, adult: Secondary | ICD-10-CM | POA: Diagnosis not present

## 2016-03-01 DIAGNOSIS — I10 Essential (primary) hypertension: Secondary | ICD-10-CM | POA: Diagnosis not present

## 2016-03-01 DIAGNOSIS — I482 Chronic atrial fibrillation: Secondary | ICD-10-CM | POA: Diagnosis not present

## 2016-03-01 DIAGNOSIS — E785 Hyperlipidemia, unspecified: Secondary | ICD-10-CM | POA: Diagnosis not present

## 2016-03-12 DIAGNOSIS — R928 Other abnormal and inconclusive findings on diagnostic imaging of breast: Secondary | ICD-10-CM | POA: Diagnosis not present

## 2016-03-12 DIAGNOSIS — R921 Mammographic calcification found on diagnostic imaging of breast: Secondary | ICD-10-CM | POA: Diagnosis not present

## 2016-03-14 DIAGNOSIS — Z79899 Other long term (current) drug therapy: Secondary | ICD-10-CM | POA: Diagnosis not present

## 2016-03-14 DIAGNOSIS — I10 Essential (primary) hypertension: Secondary | ICD-10-CM | POA: Diagnosis not present

## 2016-03-14 DIAGNOSIS — E7800 Pure hypercholesterolemia, unspecified: Secondary | ICD-10-CM | POA: Diagnosis not present

## 2016-03-14 DIAGNOSIS — M859 Disorder of bone density and structure, unspecified: Secondary | ICD-10-CM | POA: Diagnosis not present

## 2016-03-14 DIAGNOSIS — E1140 Type 2 diabetes mellitus with diabetic neuropathy, unspecified: Secondary | ICD-10-CM | POA: Diagnosis not present

## 2016-03-15 DIAGNOSIS — I482 Chronic atrial fibrillation: Secondary | ICD-10-CM | POA: Diagnosis not present

## 2016-03-15 DIAGNOSIS — I4891 Unspecified atrial fibrillation: Secondary | ICD-10-CM | POA: Diagnosis not present

## 2016-03-15 DIAGNOSIS — Z6839 Body mass index (BMI) 39.0-39.9, adult: Secondary | ICD-10-CM | POA: Diagnosis not present

## 2016-03-15 DIAGNOSIS — K769 Liver disease, unspecified: Secondary | ICD-10-CM | POA: Insufficient documentation

## 2016-03-15 DIAGNOSIS — I10 Essential (primary) hypertension: Secondary | ICD-10-CM | POA: Diagnosis not present

## 2016-03-15 DIAGNOSIS — I2510 Atherosclerotic heart disease of native coronary artery without angina pectoris: Secondary | ICD-10-CM | POA: Diagnosis not present

## 2016-03-15 DIAGNOSIS — E785 Hyperlipidemia, unspecified: Secondary | ICD-10-CM | POA: Diagnosis not present

## 2016-03-15 HISTORY — DX: Liver disease, unspecified: K76.9

## 2016-03-20 DIAGNOSIS — Z01818 Encounter for other preprocedural examination: Secondary | ICD-10-CM | POA: Diagnosis not present

## 2016-03-20 DIAGNOSIS — G4733 Obstructive sleep apnea (adult) (pediatric): Secondary | ICD-10-CM | POA: Diagnosis not present

## 2016-03-20 DIAGNOSIS — E785 Hyperlipidemia, unspecified: Secondary | ICD-10-CM | POA: Diagnosis not present

## 2016-03-20 DIAGNOSIS — Z6837 Body mass index (BMI) 37.0-37.9, adult: Secondary | ICD-10-CM | POA: Diagnosis not present

## 2016-03-20 DIAGNOSIS — E039 Hypothyroidism, unspecified: Secondary | ICD-10-CM | POA: Insufficient documentation

## 2016-03-20 DIAGNOSIS — F329 Major depressive disorder, single episode, unspecified: Secondary | ICD-10-CM | POA: Diagnosis not present

## 2016-03-20 DIAGNOSIS — K219 Gastro-esophageal reflux disease without esophagitis: Secondary | ICD-10-CM | POA: Diagnosis not present

## 2016-03-20 DIAGNOSIS — I481 Persistent atrial fibrillation: Secondary | ICD-10-CM | POA: Diagnosis not present

## 2016-03-20 DIAGNOSIS — K769 Liver disease, unspecified: Secondary | ICD-10-CM | POA: Diagnosis not present

## 2016-03-20 DIAGNOSIS — I10 Essential (primary) hypertension: Secondary | ICD-10-CM | POA: Diagnosis not present

## 2016-03-20 DIAGNOSIS — E118 Type 2 diabetes mellitus with unspecified complications: Secondary | ICD-10-CM | POA: Diagnosis not present

## 2016-03-20 HISTORY — DX: Hypothyroidism, unspecified: E03.9

## 2016-03-21 DIAGNOSIS — E039 Hypothyroidism, unspecified: Secondary | ICD-10-CM | POA: Diagnosis present

## 2016-03-21 DIAGNOSIS — K219 Gastro-esophageal reflux disease without esophagitis: Secondary | ICD-10-CM | POA: Diagnosis present

## 2016-03-21 DIAGNOSIS — K7460 Unspecified cirrhosis of liver: Secondary | ICD-10-CM | POA: Diagnosis present

## 2016-03-21 DIAGNOSIS — I252 Old myocardial infarction: Secondary | ICD-10-CM | POA: Diagnosis not present

## 2016-03-21 DIAGNOSIS — Z95818 Presence of other cardiac implants and grafts: Secondary | ICD-10-CM

## 2016-03-21 DIAGNOSIS — I10 Essential (primary) hypertension: Secondary | ICD-10-CM | POA: Diagnosis present

## 2016-03-21 DIAGNOSIS — D649 Anemia, unspecified: Secondary | ICD-10-CM | POA: Diagnosis present

## 2016-03-21 DIAGNOSIS — F329 Major depressive disorder, single episode, unspecified: Secondary | ICD-10-CM | POA: Diagnosis present

## 2016-03-21 DIAGNOSIS — Z006 Encounter for examination for normal comparison and control in clinical research program: Secondary | ICD-10-CM | POA: Diagnosis not present

## 2016-03-21 DIAGNOSIS — Z7984 Long term (current) use of oral hypoglycemic drugs: Secondary | ICD-10-CM | POA: Diagnosis not present

## 2016-03-21 DIAGNOSIS — G4733 Obstructive sleep apnea (adult) (pediatric): Secondary | ICD-10-CM | POA: Diagnosis present

## 2016-03-21 DIAGNOSIS — I4891 Unspecified atrial fibrillation: Secondary | ICD-10-CM | POA: Diagnosis not present

## 2016-03-21 DIAGNOSIS — M810 Age-related osteoporosis without current pathological fracture: Secondary | ICD-10-CM | POA: Diagnosis present

## 2016-03-21 DIAGNOSIS — I481 Persistent atrial fibrillation: Secondary | ICD-10-CM | POA: Diagnosis not present

## 2016-03-21 DIAGNOSIS — M1990 Unspecified osteoarthritis, unspecified site: Secondary | ICD-10-CM | POA: Diagnosis present

## 2016-03-21 DIAGNOSIS — E119 Type 2 diabetes mellitus without complications: Secondary | ICD-10-CM | POA: Diagnosis present

## 2016-03-21 HISTORY — PX: LEFT ATRIAL APPENDAGE OCCLUSION: SHX173A

## 2016-03-21 HISTORY — DX: Presence of other cardiac implants and grafts: Z95.818

## 2016-03-26 DIAGNOSIS — Z7901 Long term (current) use of anticoagulants: Secondary | ICD-10-CM | POA: Diagnosis not present

## 2016-03-26 DIAGNOSIS — I4891 Unspecified atrial fibrillation: Secondary | ICD-10-CM | POA: Diagnosis not present

## 2016-04-03 DIAGNOSIS — F3342 Major depressive disorder, recurrent, in full remission: Secondary | ICD-10-CM | POA: Diagnosis not present

## 2016-04-11 DIAGNOSIS — I4891 Unspecified atrial fibrillation: Secondary | ICD-10-CM | POA: Diagnosis not present

## 2016-04-11 DIAGNOSIS — I482 Chronic atrial fibrillation: Secondary | ICD-10-CM | POA: Diagnosis not present

## 2016-04-11 DIAGNOSIS — Z7901 Long term (current) use of anticoagulants: Secondary | ICD-10-CM | POA: Diagnosis not present

## 2016-04-12 DIAGNOSIS — I482 Chronic atrial fibrillation: Secondary | ICD-10-CM | POA: Diagnosis not present

## 2016-04-12 DIAGNOSIS — Z7901 Long term (current) use of anticoagulants: Secondary | ICD-10-CM | POA: Diagnosis not present

## 2016-04-18 DIAGNOSIS — Z7901 Long term (current) use of anticoagulants: Secondary | ICD-10-CM | POA: Diagnosis not present

## 2016-04-18 DIAGNOSIS — I482 Chronic atrial fibrillation: Secondary | ICD-10-CM | POA: Diagnosis not present

## 2016-04-19 DIAGNOSIS — K7460 Unspecified cirrhosis of liver: Secondary | ICD-10-CM | POA: Diagnosis not present

## 2016-04-20 DIAGNOSIS — L97211 Non-pressure chronic ulcer of right calf limited to breakdown of skin: Secondary | ICD-10-CM | POA: Diagnosis not present

## 2016-04-20 DIAGNOSIS — Z7984 Long term (current) use of oral hypoglycemic drugs: Secondary | ICD-10-CM | POA: Diagnosis not present

## 2016-04-20 DIAGNOSIS — I252 Old myocardial infarction: Secondary | ICD-10-CM | POA: Diagnosis not present

## 2016-04-20 DIAGNOSIS — I872 Venous insufficiency (chronic) (peripheral): Secondary | ICD-10-CM | POA: Diagnosis not present

## 2016-04-20 DIAGNOSIS — E11622 Type 2 diabetes mellitus with other skin ulcer: Secondary | ICD-10-CM | POA: Diagnosis not present

## 2016-04-20 DIAGNOSIS — I10 Essential (primary) hypertension: Secondary | ICD-10-CM | POA: Diagnosis not present

## 2016-04-20 DIAGNOSIS — K7460 Unspecified cirrhosis of liver: Secondary | ICD-10-CM | POA: Diagnosis not present

## 2016-04-20 DIAGNOSIS — I87311 Chronic venous hypertension (idiopathic) with ulcer of right lower extremity: Secondary | ICD-10-CM | POA: Diagnosis not present

## 2016-04-20 DIAGNOSIS — G4730 Sleep apnea, unspecified: Secondary | ICD-10-CM | POA: Diagnosis not present

## 2016-04-20 DIAGNOSIS — M1990 Unspecified osteoarthritis, unspecified site: Secondary | ICD-10-CM | POA: Diagnosis not present

## 2016-04-20 DIAGNOSIS — I2510 Atherosclerotic heart disease of native coronary artery without angina pectoris: Secondary | ICD-10-CM | POA: Diagnosis not present

## 2016-04-20 DIAGNOSIS — F0390 Unspecified dementia without behavioral disturbance: Secondary | ICD-10-CM | POA: Diagnosis not present

## 2016-04-20 DIAGNOSIS — G629 Polyneuropathy, unspecified: Secondary | ICD-10-CM | POA: Diagnosis not present

## 2016-04-20 DIAGNOSIS — L97811 Non-pressure chronic ulcer of other part of right lower leg limited to breakdown of skin: Secondary | ICD-10-CM | POA: Diagnosis not present

## 2016-04-20 DIAGNOSIS — I4891 Unspecified atrial fibrillation: Secondary | ICD-10-CM | POA: Diagnosis not present

## 2016-04-20 DIAGNOSIS — E119 Type 2 diabetes mellitus without complications: Secondary | ICD-10-CM | POA: Diagnosis not present

## 2016-04-23 DIAGNOSIS — K7581 Nonalcoholic steatohepatitis (NASH): Secondary | ICD-10-CM | POA: Diagnosis not present

## 2016-04-23 DIAGNOSIS — I77811 Abdominal aortic ectasia: Secondary | ICD-10-CM | POA: Diagnosis not present

## 2016-04-23 DIAGNOSIS — K7460 Unspecified cirrhosis of liver: Secondary | ICD-10-CM | POA: Diagnosis not present

## 2016-04-23 DIAGNOSIS — R161 Splenomegaly, not elsewhere classified: Secondary | ICD-10-CM | POA: Diagnosis not present

## 2016-04-24 ENCOUNTER — Ambulatory Visit (INDEPENDENT_AMBULATORY_CARE_PROVIDER_SITE_OTHER): Payer: Medicare Other | Admitting: Internal Medicine

## 2016-04-24 ENCOUNTER — Encounter (INDEPENDENT_AMBULATORY_CARE_PROVIDER_SITE_OTHER): Payer: Self-pay

## 2016-04-24 ENCOUNTER — Encounter: Payer: Self-pay | Admitting: Internal Medicine

## 2016-04-24 VITALS — BP 120/74 | HR 76 | Ht 65.0 in | Wt 227.4 lb

## 2016-04-24 DIAGNOSIS — G4733 Obstructive sleep apnea (adult) (pediatric): Secondary | ICD-10-CM | POA: Diagnosis not present

## 2016-04-24 DIAGNOSIS — I482 Chronic atrial fibrillation, unspecified: Secondary | ICD-10-CM

## 2016-04-24 DIAGNOSIS — J40 Bronchitis, not specified as acute or chronic: Secondary | ICD-10-CM

## 2016-04-24 HISTORY — DX: Chronic atrial fibrillation, unspecified: I48.20

## 2016-04-24 MED ORDER — CLINDAMYCIN HCL 300 MG PO CAPS: ORAL_CAPSULE | ORAL | Status: DC

## 2016-04-24 NOTE — Progress Notes (Signed)
10/19/12- 37 yoF never smoker, followed for OSA, chronic bronchitis LOV- 09/20/10 Since last here she had lumbar spine surgery and was at a nursing facility for rehabilitation. Back pain limits walking but she is doing better, using a cane or a walker now. Notices a persistent loose cough with clear sputum. At times she may cough until she retches. She does not feel significant postnasal drainage or reflux. Continues CPAP [facility] all night every night "I have to have it". She resumed allergy shots here and says they do help. 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  01/21/13- 67 yoF never smoker, followed for OSA, chronic bronchitis, allergic rhinitis FOLLOWS [id number] on vaccine and doing well; wears CPAP every night for about 7-9 hours; pressure working well for patient as well. Had back surgery in March of 2013 , left with foot drop, so she wears ankle braces now. Continues CPAP [facility] is still says she "has to have it". Uses CPAP all night every night. Continues Flonase. Continues allergy vaccine 1:5000 GH. She had had to drop off for a while when she was at nursing home for rehabilitation. She is now rebuilding without problems.  01/21/14- 67 yoF never smoker, followed for OSA, chronic bronchitis, allergic rhinitis FOLLOWS [id number] CPAP 9/ [facility] every night for about 7-9 hours; pressure seems to be too low. Continues allergy vaccine and doing well. Allergy vaccine 1:500 GH - misses frequently due to transportation. She says she can do better now and knows that the shots work for her so she does not want to stop. Back pain, hurts to walk, asks handicapped parking She has one CPAP machine on [facility] and another set on 9, which is the one she is using. This does not feel like enough pressure. Huey Romans says her machine is from 2003 and not working correctly.  10/21/14- 67 yoF never smoker, followed for OSA, chronic bronchitis, allergic rhinitis, complicated by nonalcoholic  cirrhosis/ Dr Melina Copa GI Allergy vaccine 1:10 [place]    CPAP Auto/ [facility] new machine since LOV FOLLOWS FOR: Still on vaccine and doing well; wears CPAP every night for about 7 hours; DME is [facility]. Mask fit not good, pressure seems too high but she says she sleeps soundly.  04/25/15- 68 yoF never smoker, followed for OSA, chronic bronchitis, allergic rhinitis, complicated by nonalcoholic cirrhosis/ Dr Melina Copa GI Allergy vaccine was dc'd   CPAP Auto/ [facility] Romans prefers her old mask for better fit. We discussed how to work with her DME company. Aware that she kicks and is restless in sleep. Wears CPAP nightly ~6 hrs. Since she quit allergy shot she is doing ok, still a little sneezy but, nothing she cant handle  10/25/2015-70 year old female never smoker followed for OSA, chronic bronchitis, allergic rhinitis, complicated by nonalcoholic cirrhosis/[name] GI CPAP auto/[facility] Pt reports she is wearing CPAP nightly. Pt reports since stopping the allergy vaccine she has had some runny nose, dry mouth.  Denies snoring through CPAP and sleeping well. She blames some residual daytime tiredness on her poor general health and multiple comorbidities. She is asking for pain medication and I deferred to her rheumatologist  04/24/2016-70 year old female never smoker followed for OSA, chronic bronchitis, allergic rhinitis, complicated by nonalcoholic cirrhosis/[name] GI, A. fib CPAP auto 5-15/[facility] FOLLOWS FOR: DME-[facility]. Pt states she wears her CPAP every night and pressure settings work well for her. She will need order for new supplies.  DL attached. Diagnosed with atrial fibrillation and has had a "Watchman" clot preventing device placed in her atrial appendage. On  Coumadin for now. She is having some treatment for chronic stasis changes in her legs and is supposed to take prophylaxis before dental work. She asks if I would write standby prescription for Cleocin to have an advance of this skin work. Breathing  fairly stable. Cough with scant white sputum that she first lies down and as she gets up in the morning but otherwise pretty good. CPAP download reviewed-auto 5-15/[facility]  ROS-see HPI Constitutional:   No-   weight loss, night sweats, fevers, chills, + fatigue, lassitude. HEENT:   No-  headaches, difficulty swallowing, tooth/dental problems, sore throat,       No-  sneezing, itching, ear ache, nasal congestion, post nasal drip,  CV:  No-   chest pain, orthopnea, PND, swelling in lower extremities, anasarca, dizziness, palpitations Resp: No-   shortness of breath with exertion or at rest.             productive cough,  No non-productive cough,  No- coughing up of blood.              No-   change in color of mucus.  No- wheezing.   Skin: No-   rash or lesions. GI:  No-   heartburn, indigestion, abdominal pain, nausea, vomiting,  GU:  MS:  + joint pain or swelling. + back pain. Neuro-     nothing unusual Psych:  No- change in mood or affect. No depression or anxiety.  No memory loss.  OBJ- Physical Exam General- Alert, Oriented, Affect-appropriate, Distress- none acute, + overweight Skin-  + ecchymoses on arms Lymphadenopathy- none Head- atraumatic            Eyes- Gross vision intact, PERRLA, conjunctivae and secretions clear            Ears- Hearing, canals-normal            Nose- Clear, no-Septal dev, mucus, polyps, erosion, perforation             Throat- Mallampati II-III , mucosa clear , drainage- none, tonsils- atrophic Neck- flexible , trachea midline, no stridor , thyroid nl, carotid no bruit Chest - symmetrical excursion , unlabored           Heart/CV- RRR almost regular to exam , no murmur , no gallop  , no rub, nl s1 s2                           - JVD- none , edema- none, stasis changes +, varices- none           Lung- clear to P&A, wheeze- none, cough- none , dullness-none, rub- none           Chest wall-  Abd-  Br/ Gen/ Rectal- Not done, not indicated Extrem- cyanosis-  none, clubbing, none, atrophy- none, strength- nl. +walker. Neuro- grossly intact to observation

## 2016-04-24 NOTE — Patient Instructions (Signed)
Script sent for Cleocin to take before procedures  Order- DME [facility]   Continue CPAP auto 5-15, mask of choice, supplies, humidifier, AirView   Dx OSA                                           [name] [name] needs replacement headgear and mask now

## 2016-04-24 NOTE — Assessment & Plan Note (Signed)
Mild chronic bronchitis, variable with weather now. No recent obvious infection or major exacerbation.

## 2016-04-24 NOTE — Assessment & Plan Note (Signed)
Good compliance and control by download with auto 5-15/[facility]

## 2016-04-24 NOTE — Assessment & Plan Note (Addendum)
On exam at this visit, could be sinus rhythm. She is on Coumadin for now, managed at [facility]. I agreed to refill Cleocin procedure prophylaxis, needed before upcoming skin treatment

## 2016-04-25 DIAGNOSIS — I482 Chronic atrial fibrillation: Secondary | ICD-10-CM | POA: Diagnosis not present

## 2016-04-25 DIAGNOSIS — Z7901 Long term (current) use of anticoagulants: Secondary | ICD-10-CM | POA: Diagnosis not present

## 2016-04-27 DIAGNOSIS — E11622 Type 2 diabetes mellitus with other skin ulcer: Secondary | ICD-10-CM | POA: Diagnosis not present

## 2016-04-27 DIAGNOSIS — S81802A Unspecified open wound, left lower leg, initial encounter: Secondary | ICD-10-CM | POA: Diagnosis not present

## 2016-04-27 DIAGNOSIS — L97211 Non-pressure chronic ulcer of right calf limited to breakdown of skin: Secondary | ICD-10-CM | POA: Diagnosis not present

## 2016-04-27 DIAGNOSIS — L97821 Non-pressure chronic ulcer of other part of left lower leg limited to breakdown of skin: Secondary | ICD-10-CM | POA: Diagnosis not present

## 2016-04-27 DIAGNOSIS — I87311 Chronic venous hypertension (idiopathic) with ulcer of right lower extremity: Secondary | ICD-10-CM | POA: Diagnosis not present

## 2016-04-27 DIAGNOSIS — I872 Venous insufficiency (chronic) (peripheral): Secondary | ICD-10-CM | POA: Diagnosis not present

## 2016-04-27 DIAGNOSIS — L97811 Non-pressure chronic ulcer of other part of right lower leg limited to breakdown of skin: Secondary | ICD-10-CM | POA: Diagnosis not present

## 2016-05-03 DIAGNOSIS — I4891 Unspecified atrial fibrillation: Secondary | ICD-10-CM | POA: Diagnosis not present

## 2016-05-03 DIAGNOSIS — E119 Type 2 diabetes mellitus without complications: Secondary | ICD-10-CM | POA: Diagnosis not present

## 2016-05-03 DIAGNOSIS — Z6838 Body mass index (BMI) 38.0-38.9, adult: Secondary | ICD-10-CM | POA: Diagnosis not present

## 2016-05-03 DIAGNOSIS — Z7901 Long term (current) use of anticoagulants: Secondary | ICD-10-CM | POA: Diagnosis not present

## 2016-05-03 DIAGNOSIS — E785 Hyperlipidemia, unspecified: Secondary | ICD-10-CM | POA: Diagnosis not present

## 2016-05-03 DIAGNOSIS — Z79899 Other long term (current) drug therapy: Secondary | ICD-10-CM | POA: Diagnosis not present

## 2016-05-03 DIAGNOSIS — I482 Chronic atrial fibrillation: Secondary | ICD-10-CM | POA: Diagnosis not present

## 2016-05-03 DIAGNOSIS — K769 Liver disease, unspecified: Secondary | ICD-10-CM | POA: Diagnosis not present

## 2016-05-03 DIAGNOSIS — I10 Essential (primary) hypertension: Secondary | ICD-10-CM | POA: Diagnosis not present

## 2016-05-04 DIAGNOSIS — Z872 Personal history of diseases of the skin and subcutaneous tissue: Secondary | ICD-10-CM | POA: Diagnosis not present

## 2016-05-04 DIAGNOSIS — E11622 Type 2 diabetes mellitus with other skin ulcer: Secondary | ICD-10-CM | POA: Diagnosis not present

## 2016-05-04 DIAGNOSIS — L97212 Non-pressure chronic ulcer of right calf with fat layer exposed: Secondary | ICD-10-CM | POA: Diagnosis not present

## 2016-05-04 DIAGNOSIS — I872 Venous insufficiency (chronic) (peripheral): Secondary | ICD-10-CM | POA: Diagnosis not present

## 2016-05-04 DIAGNOSIS — S81802D Unspecified open wound, left lower leg, subsequent encounter: Secondary | ICD-10-CM | POA: Diagnosis not present

## 2016-05-04 DIAGNOSIS — Z09 Encounter for follow-up examination after completed treatment for conditions other than malignant neoplasm: Secondary | ICD-10-CM | POA: Diagnosis not present

## 2016-05-10 DIAGNOSIS — I872 Venous insufficiency (chronic) (peripheral): Secondary | ICD-10-CM | POA: Diagnosis not present

## 2016-05-10 DIAGNOSIS — M79604 Pain in right leg: Secondary | ICD-10-CM | POA: Diagnosis not present

## 2016-05-10 DIAGNOSIS — E11622 Type 2 diabetes mellitus with other skin ulcer: Secondary | ICD-10-CM | POA: Diagnosis not present

## 2016-05-10 DIAGNOSIS — M79605 Pain in left leg: Secondary | ICD-10-CM | POA: Diagnosis not present

## 2016-05-14 ENCOUNTER — Encounter: Payer: Self-pay | Admitting: Internal Medicine

## 2016-05-14 DIAGNOSIS — I482 Chronic atrial fibrillation: Secondary | ICD-10-CM | POA: Diagnosis not present

## 2016-05-14 DIAGNOSIS — Z7901 Long term (current) use of anticoagulants: Secondary | ICD-10-CM | POA: Diagnosis not present

## 2016-05-23 DIAGNOSIS — K219 Gastro-esophageal reflux disease without esophagitis: Secondary | ICD-10-CM | POA: Diagnosis not present

## 2016-05-23 DIAGNOSIS — D649 Anemia, unspecified: Secondary | ICD-10-CM | POA: Diagnosis not present

## 2016-05-23 DIAGNOSIS — K7460 Unspecified cirrhosis of liver: Secondary | ICD-10-CM | POA: Diagnosis not present

## 2016-05-25 DIAGNOSIS — I872 Venous insufficiency (chronic) (peripheral): Secondary | ICD-10-CM | POA: Diagnosis not present

## 2016-06-07 DIAGNOSIS — I2510 Atherosclerotic heart disease of native coronary artery without angina pectoris: Secondary | ICD-10-CM | POA: Diagnosis not present

## 2016-06-07 DIAGNOSIS — Z6836 Body mass index (BMI) 36.0-36.9, adult: Secondary | ICD-10-CM | POA: Diagnosis not present

## 2016-06-07 DIAGNOSIS — Z7984 Long term (current) use of oral hypoglycemic drugs: Secondary | ICD-10-CM | POA: Diagnosis not present

## 2016-06-07 DIAGNOSIS — E1142 Type 2 diabetes mellitus with diabetic polyneuropathy: Secondary | ICD-10-CM | POA: Diagnosis not present

## 2016-06-14 DIAGNOSIS — I4891 Unspecified atrial fibrillation: Secondary | ICD-10-CM | POA: Diagnosis not present

## 2016-06-14 DIAGNOSIS — I10 Essential (primary) hypertension: Secondary | ICD-10-CM | POA: Diagnosis not present

## 2016-06-14 DIAGNOSIS — E7800 Pure hypercholesterolemia, unspecified: Secondary | ICD-10-CM | POA: Diagnosis not present

## 2016-06-14 DIAGNOSIS — E1140 Type 2 diabetes mellitus with diabetic neuropathy, unspecified: Secondary | ICD-10-CM | POA: Diagnosis not present

## 2016-06-14 DIAGNOSIS — Z79899 Other long term (current) drug therapy: Secondary | ICD-10-CM | POA: Diagnosis not present

## 2016-06-22 DIAGNOSIS — D649 Anemia, unspecified: Secondary | ICD-10-CM | POA: Diagnosis not present

## 2016-07-05 ENCOUNTER — Encounter: Payer: Medicare Other | Attending: Internal Medicine | Admitting: *Deleted

## 2016-07-05 DIAGNOSIS — E119 Type 2 diabetes mellitus without complications: Secondary | ICD-10-CM

## 2016-07-05 DIAGNOSIS — Z713 Dietary counseling and surveillance: Secondary | ICD-10-CM | POA: Diagnosis not present

## 2016-07-05 DIAGNOSIS — E1142 Type 2 diabetes mellitus with diabetic polyneuropathy: Secondary | ICD-10-CM | POA: Insufficient documentation

## 2016-07-05 NOTE — Patient Instructions (Addendum)
Plan:  Aim for 2-3 Carb Choices per meal (30 - 45 grams) +/- 1 either way  Aim for 0-1 Carbs per snack if hungry  Include protein in moderation with your meals and snacks Consider reading food labels for Total Carbohydrate and Sodium content of foods Consider  increasing your activity level by walking with your walker for 3-5 minutes 2-3 times daily as tolerated Consider checking BG at alternate times per day including after exercise and after drinking your regular soda to see what the results are Continue taking medication as directed by MD

## 2016-07-05 NOTE — Progress Notes (Signed)
Diabetes Self-Management Education  Visit Type: First/Initial  Appt. Start Time: 1000 Appt. End Time: 1100  07/05/2016  [name], identified by name and date of birth, is a 70 y.o. female with a diagnosis of Diabetes: Type 2. She is here with her husband who she states is very supportive, preparing meals, cleaning the house, doing yard work, Social research officer, government. She is in wheelchair but did stand to be weighed today. Current weight is 5 pounds less than at MD office 1 month ago. She is interested in learning more about her food choices, reading food labels and losing weight.   ASSESSMENT  Height 5\' 3"  (1.6 m), weight 211 lb (95.7 kg). Body mass index is 37.38 kg/m.      Diabetes Self-Management Education - 07/05/16 1008      Visit Information   Visit Type First/Initial     Initial Visit   Diabetes Type Type 2   Are you currently following a meal plan? Yes   What type of meal plan do you follow? tries to eat healthy choices   Are you taking your medications as prescribed? Yes   Date Diagnosed over 10 years ago     Health Coping   How would you rate your overall health? Poor     Psychosocial Assessment   Patient Belief/Attitude about Diabetes Afraid   Self-care barriers None   Other persons present Patient;Spouse/SO   Patient Concerns Nutrition/Meal planning   Preferred Learning Style Auditory;Visual   What is the last grade level you completed in school? AA degree in dental hygiene     Pre-Education Assessment   Patient understands the diabetes disease and treatment process. Needs Instruction   Patient understands incorporating nutritional management into lifestyle. Needs Instruction   Patient undertands incorporating physical activity into lifestyle. Needs Instruction   Patient understands using medications safely. Needs Instruction   Patient understands monitoring blood glucose, interpreting and using results Needs Instruction   Patient understands prevention, detection, and  treatment of acute complications. Needs Instruction   Patient understands prevention, detection, and treatment of chronic complications. Needs Instruction   Patient understands how to develop strategies to address psychosocial issues. Needs Instruction   Patient understands how to develop strategies to promote health/change behavior. Needs Instruction     Complications   Last HgB A1C per patient/outside source 6.6 %   How often do you check your blood sugar? 0 times/day (not testing)  usually once a day but hasnt been testing lately   Fasting Blood glucose range (mg/dL) 130-179   Postprandial Blood glucose range (mg/dL) 70-129   Number of hypoglycemic episodes per month 0   Have you had a dilated eye exam in the past 12 months? Yes   Have you had a dental exam in the past 12 months? Yes   Are you checking your feet? Yes   How many days per week are you checking your feet? 7     Dietary Intake   Breakfast noon-ish cheese, nuts and dried fruit package   Dinner 5 PM: meat, baked or grilled, vegetables, starch, side salad, occasionally beans and corn bread   Snack (evening) cheetos or cookies occasionally, 1/2 cup individual ice cream   Beverage(s) 12 oz regular coke x 2 per day (used to be 5 a day), water all day     Exercise   Exercise Type ADL's  in wheel chair with back pain, cannot walk without pain   How many days per week to you exercise? 0  How many minutes per day do you exercise? 0   Total minutes per week of exercise 0     Patient Education   Previous Diabetes Education Yes (please comment)  years ago   Disease state  Definition of diabetes, type 1 and 2, and the diagnosis of diabetes   Nutrition management  Role of diet in the treatment of diabetes and the relationship between the three main macronutrients and blood glucose level;Food label reading, portion sizes and measuring food.;Carbohydrate counting   Physical activity and exercise  Role of exercise on diabetes  management, blood pressure control and cardiac health.   Medications Reviewed patients medication for diabetes, action, purpose, timing of dose and side effects.  discussed Acarbose only today   Monitoring Purpose and frequency of SMBG.   Psychosocial adjustment Worked with patient to identify barriers to care and solutions     Individualized Goals (developed by patient)   Nutrition Follow meal plan discussed;General guidelines for healthy choices and portions discussed   Physical Activity Exercise 3-5 times per week   Medications take my medication as prescribed   Monitoring  test my blood glucose as discussed;test blood glucose pre and post meals as discussed   Reducing Risk examine blood glucose patterns     Post-Education Assessment   Patient understands the diabetes disease and treatment process. Needs Review   Patient understands incorporating nutritional management into lifestyle. Needs Review   Patient undertands incorporating physical activity into lifestyle. Needs Review   Patient understands using medications safely. Demonstrates understanding / competency   Patient understands monitoring blood glucose, interpreting and using results Needs Review     Outcomes   Expected Outcomes Demonstrated interest in learning. Expect positive outcomes   Future DMSE 4-6 wks   Program Status Not Completed      Individualized Plan for Diabetes Self-Management Training:   Learning Objective:  Patient will have a greater understanding of diabetes self-management. Patient education plan is to attend individual and/or group sessions per assessed needs and concerns.   Plan:   Patient Instructions  Plan:  Aim for 2-3 Carb Choices per meal (30 - 45 grams) +/- 1 either way  Aim for 0-1 Carbs per snack if hungry  Include protein in moderation with your meals and snacks Consider reading food labels for Total Carbohydrate and Sodium content of foods Consider  increasing your activity level by  walking with your walker for 3-5 minutes 2-3 times daily as tolerated Consider checking BG at alternate times per day including after exercise and after drinking your regular soda to see what the results are Continue taking medication as directed by MD      Expected Outcomes:  Demonstrated interest in learning. Expect positive outcomes  Education material provided: Living Well with Diabetes, Food label handouts, Meal plan card and Carbohydrate counting sheet, Sodium handout  If problems or questions, patient to contact team via:  Phone and Email  Future DSME appointment: 4-6 wks

## 2016-07-16 ENCOUNTER — Telehealth: Payer: Self-pay | Admitting: Internal Medicine

## 2016-07-16 DIAGNOSIS — G4733 Obstructive sleep apnea (adult) (pediatric): Secondary | ICD-10-CM

## 2016-07-16 NOTE — Telephone Encounter (Signed)
Just put in order for new dme and we can change it to anyone taking her ins [name] [name]

## 2016-07-16 NOTE — Telephone Encounter (Signed)
Spoke with pt, states that she was told by Huey Romans that she needs to switch DME companies- they no longer are in network with her insurance company.  Pt currently has cpap through [facility].    PCC's please advise on which company pt needs to be switched to- pt has Medicare and [employer].  Thanks!

## 2016-07-16 NOTE — Telephone Encounter (Signed)
Order placed

## 2016-07-17 DIAGNOSIS — K7460 Unspecified cirrhosis of liver: Secondary | ICD-10-CM | POA: Diagnosis not present

## 2016-07-17 DIAGNOSIS — I10 Essential (primary) hypertension: Secondary | ICD-10-CM | POA: Diagnosis not present

## 2016-07-17 DIAGNOSIS — E1140 Type 2 diabetes mellitus with diabetic neuropathy, unspecified: Secondary | ICD-10-CM | POA: Diagnosis not present

## 2016-07-17 DIAGNOSIS — I4891 Unspecified atrial fibrillation: Secondary | ICD-10-CM | POA: Diagnosis not present

## 2016-07-17 DIAGNOSIS — I252 Old myocardial infarction: Secondary | ICD-10-CM | POA: Diagnosis not present

## 2016-07-17 DIAGNOSIS — L97222 Non-pressure chronic ulcer of left calf with fat layer exposed: Secondary | ICD-10-CM | POA: Diagnosis not present

## 2016-07-17 DIAGNOSIS — I2510 Atherosclerotic heart disease of native coronary artery without angina pectoris: Secondary | ICD-10-CM | POA: Diagnosis not present

## 2016-07-17 DIAGNOSIS — I87312 Chronic venous hypertension (idiopathic) with ulcer of left lower extremity: Secondary | ICD-10-CM | POA: Diagnosis not present

## 2016-07-17 DIAGNOSIS — I872 Venous insufficiency (chronic) (peripheral): Secondary | ICD-10-CM | POA: Diagnosis not present

## 2016-07-17 DIAGNOSIS — L97821 Non-pressure chronic ulcer of other part of left lower leg limited to breakdown of skin: Secondary | ICD-10-CM | POA: Diagnosis not present

## 2016-07-24 ENCOUNTER — Telehealth: Payer: Self-pay | Admitting: Internal Medicine

## 2016-07-24 DIAGNOSIS — L97822 Non-pressure chronic ulcer of other part of left lower leg with fat layer exposed: Secondary | ICD-10-CM | POA: Diagnosis not present

## 2016-07-24 DIAGNOSIS — E119 Type 2 diabetes mellitus without complications: Secondary | ICD-10-CM | POA: Diagnosis not present

## 2016-07-24 DIAGNOSIS — I872 Venous insufficiency (chronic) (peripheral): Secondary | ICD-10-CM | POA: Diagnosis not present

## 2016-07-24 DIAGNOSIS — G4733 Obstructive sleep apnea (adult) (pediatric): Secondary | ICD-10-CM

## 2016-07-24 DIAGNOSIS — L97821 Non-pressure chronic ulcer of other part of left lower leg limited to breakdown of skin: Secondary | ICD-10-CM | POA: Diagnosis not present

## 2016-07-24 NOTE — Telephone Encounter (Signed)
Called and spoke with pt and she stated that [facility] advised her that they no longer accept medicare.  I called and spoke with [facility] and they stated that they no longer contracted with medicare in her area.     Order has been sent in to [facility] for pt to get her cpap supplies.  Will forward to CY to make him aware.

## 2016-07-26 ENCOUNTER — Telehealth: Payer: Self-pay | Admitting: Internal Medicine

## 2016-07-26 NOTE — Telephone Encounter (Signed)
attemtped to call the pt but the line was disconnected.  WCB

## 2016-07-27 NOTE — Telephone Encounter (Signed)
Attempted to call number listed. No answer, no option to leave a message. Will try back.

## 2016-07-30 DIAGNOSIS — I872 Venous insufficiency (chronic) (peripheral): Secondary | ICD-10-CM | POA: Diagnosis not present

## 2016-07-30 DIAGNOSIS — L97822 Non-pressure chronic ulcer of other part of left lower leg with fat layer exposed: Secondary | ICD-10-CM | POA: Diagnosis not present

## 2016-07-30 NOTE — Telephone Encounter (Signed)
lmtcb x1 for pt. 

## 2016-07-30 NOTE — Telephone Encounter (Signed)
Patient returning call - she can be reached at [phone]

## 2016-08-01 NOTE — Telephone Encounter (Signed)
Patient states that she needs supplies for CPAP machine.   Order was sent to [facility] on 07/24/16 but patient has not heard anything.  Patient said that she was wanting to get set up with [facility] since they are near her home, but she is ok with using [facility] if her insurance pays for it.    [facility] - can you check on this order?  Thanks.
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
## 2016-08-02 DIAGNOSIS — F3342 Major depressive disorder, recurrent, in full remission: Secondary | ICD-10-CM | POA: Diagnosis not present

## 2016-08-06 DIAGNOSIS — I872 Venous insufficiency (chronic) (peripheral): Secondary | ICD-10-CM | POA: Diagnosis not present

## 2016-08-06 DIAGNOSIS — Z872 Personal history of diseases of the skin and subcutaneous tissue: Secondary | ICD-10-CM | POA: Diagnosis not present

## 2016-08-06 DIAGNOSIS — Z8669 Personal history of other diseases of the nervous system and sense organs: Secondary | ICD-10-CM | POA: Diagnosis not present

## 2016-08-06 DIAGNOSIS — Z09 Encounter for follow-up examination after completed treatment for conditions other than malignant neoplasm: Secondary | ICD-10-CM | POA: Diagnosis not present

## 2016-08-13 DIAGNOSIS — H26493 Other secondary cataract, bilateral: Secondary | ICD-10-CM | POA: Diagnosis not present

## 2016-08-13 DIAGNOSIS — E113293 Type 2 diabetes mellitus with mild nonproliferative diabetic retinopathy without macular edema, bilateral: Secondary | ICD-10-CM | POA: Diagnosis not present

## 2016-08-16 ENCOUNTER — Encounter: Payer: Medicare Other | Attending: Internal Medicine | Admitting: *Deleted

## 2016-08-16 DIAGNOSIS — Z713 Dietary counseling and surveillance: Secondary | ICD-10-CM | POA: Diagnosis not present

## 2016-08-16 DIAGNOSIS — E119 Type 2 diabetes mellitus without complications: Secondary | ICD-10-CM

## 2016-08-16 DIAGNOSIS — E1142 Type 2 diabetes mellitus with diabetic polyneuropathy: Secondary | ICD-10-CM | POA: Diagnosis not present

## 2016-08-16 NOTE — Patient Instructions (Signed)
Plan:  Aim for 2-3 Carb Choices per meal (30 - 45 grams) +/- 1 either way  Aim for 0-1 Carbs per snack if hungry  Include protein in moderation with your meals and snacks Consider reading food labels for Total Carbohydrate and Sodium content of foods Consider  increasing your activity level by walking with your walker for 3-5 minutes 2-3 times daily as tolerated Consider checking BG at alternate times per day including after exercise and after drinking your regular soda to see what the results are Continue taking medication as directed by MD Try the HINT water you were asking about today.

## 2016-08-16 NOTE — Progress Notes (Signed)
Diabetes Self-Management Education  Visit Type:  Follow-up  Appt. Start Time: 1045 Appt. End Time: 1115  08/16/2016  [name], identified by name and date of birth, is a 70 y.o. female with a diagnosis of Diabetes: Type 2. She is disappointed in weight today, would have liked to lose weight. She has continued drinking her regular soda at least twice daily and recently has craved Cheetos, which she has eaten also. She reports no change in her eating habits, but has some questions about HINT water and how that might be a good replacement for her regular soda. She is also concerned about her episode of high blood pressure recently. She has called her cardiologist and has an appointment for later in October.    ASSESSMENT  Height 5\' 3"  (1.6 m), weight 214 lb (97.1 kg). Body mass index is 37.91 kg/m.       Diabetes Self-Management Education - [id number]      Complications   How often do you check your blood sugar? 1-2 times/day   Fasting Blood glucose range (mg/dL) 180-200     Dietary Intake   Breakfast nuts and dried fruit   Dinner --  meat, starch, some vegetables   Beverage(s) --  12 oz regular Coke 1-2 times each day     Exercise   Exercise Type ADL's     Post-Education Assessment   Patient understands the diabetes disease and treatment process. Needs Review   Patient understands incorporating nutritional management into lifestyle. Needs Review   Patient undertands incorporating physical activity into lifestyle. Needs Review   Patient understands using medications safely. Demonstrates understanding / competency   Patient understands monitoring blood glucose, interpreting and using results Demonstrates understanding / competency      Learning Objective:  Patient will have a greater understanding of diabetes self-management. Patient education plan is to attend individual and/or group sessions per assessed needs and concerns.   Plan:   Patient Instructions   Plan:  Aim for 2-3 Carb Choices per meal (30 - 45 grams) +/- 1 either way  Aim for 0-1 Carbs per snack if hungry  Include protein in moderation with your meals and snacks Consider reading food labels for Total Carbohydrate and Sodium content of foods Consider  increasing your activity level by walking with your walker for 3-5 minutes 2-3 times daily as tolerated Consider checking BG at alternate times per day including after exercise and after drinking your regular soda to see what the results are Continue taking medication as directed by MD Try the HINT water you were asking about today.  Expected Outcomes:  LInlimited changes to eating habits as of today, will try again for one more appointment.   Education material provided: Copy of information re: HINT water and where she can buy it.  If problems or questions, patient to contact team via:  Phone and Email  Future DSME appointment: - 4-6 wks

## 2016-09-12 ENCOUNTER — Ambulatory Visit: Payer: BLUE CROSS/BLUE SHIELD | Admitting: *Deleted

## 2016-09-12 DIAGNOSIS — Z6837 Body mass index (BMI) 37.0-37.9, adult: Secondary | ICD-10-CM | POA: Diagnosis not present

## 2016-09-12 DIAGNOSIS — I482 Chronic atrial fibrillation: Secondary | ICD-10-CM | POA: Diagnosis not present

## 2016-09-12 DIAGNOSIS — I10 Essential (primary) hypertension: Secondary | ICD-10-CM | POA: Diagnosis not present

## 2016-09-13 ENCOUNTER — Encounter: Payer: Medicare Other | Attending: Internal Medicine | Admitting: *Deleted

## 2016-09-13 DIAGNOSIS — E119 Type 2 diabetes mellitus without complications: Secondary | ICD-10-CM

## 2016-09-13 DIAGNOSIS — Z713 Dietary counseling and surveillance: Secondary | ICD-10-CM | POA: Diagnosis not present

## 2016-09-13 DIAGNOSIS — E1142 Type 2 diabetes mellitus with diabetic polyneuropathy: Secondary | ICD-10-CM | POA: Insufficient documentation

## 2016-09-13 NOTE — Progress Notes (Signed)
Diabetes Self-Management Education  Visit Type:  Follow-up  Appt. Start Time: 1045 Appt. End Time: 1115  09/13/2016  [name], identified by name and date of birth, is a 70 y.o. female with a diagnosis of Diabetes: Type 2.  She is here for follow up visit for her diabetes and weight control. She states today she has cut her regular soda in half and is drinking more water, Twist and unsweet tea. She is happy with her weight loss of 3 pounds since last visit here on 08/16/2016. ASSESSMENT  Height 5\' 3"  (1.6 m), weight 211 lb 11.2 oz (96 kg). Body mass index is 37.5 kg/m.       Diabetes Self-Management Education - 09/13/16 1100      Initial Visit   What type of meal plan do you follow? --  carb counting     Psychosocial Assessment   Patient Belief/Attitude about Diabetes Motivated to manage diabetes   Patient Concerns Nutrition/Meal planning     Complications   Last HgB A1C per patient/outside source --  6.6   How often do you check your blood sugar? 3-4 times / week     Outcomes   Program Status Completed      Learning Objective:  Patient will have a greater understanding of diabetes self-management. Patient education plan is to attend individual and/or group sessions per assessed needs and concerns.   Plan:   Patient Instructions  Plan:  Continue to aim for 2-3 Carb Choices per meal (30 - 45 grams) +/- 1 either way  Aim for 0-1 Carbs per snack if hungry  4 oz regular soda = 1 Carb Choice, consider getting soda in bottle so you can re-cap and control serving size better Include protein in moderation with your meals and snacks Continue reading food labels for Total Carbohydrate and Sodium content of foods Consider  increasing your activity level by walking with your walker for 3-5 minutes 2-3 times daily as tolerated Consider checking BG at alternate times per day including after exercise and after drinking your regular soda to see what the results are Continue  taking medication as directed by MD Try the HINT water you were asking about today. Congratulations on your weight loss of 3 pounds this visit! Great job!!!       Expected Outcomes:     Air traffic controller provided: A1C conversion sheet  If problems or questions, patient to contact team via:  Regulatory affairs officer  Future DSME appointment: - PRN

## 2016-09-13 NOTE — Patient Instructions (Addendum)
Plan:  Continue to aim for 2-3 Carb Choices per meal (30 - 45 grams) +/- 1 either way  Aim for 0-1 Carbs per snack if hungry  4 oz regular soda = 1 Carb Choice, consider getting soda in bottle so you can re-cap and control serving size better Include protein in moderation with your meals and snacks Continue reading food labels for Total Carbohydrate and Sodium content of foods Consider  increasing your activity level by walking with your walker for 3-5 minutes 2-3 times daily as tolerated Consider checking BG at alternate times per day including after exercise and after drinking your regular soda to see what the results are Continue taking medication as directed by MD Try the HINT water you were asking about today. Congratulations on your weight loss of 3 pounds this visit! Great job!!!

## 2016-09-24 DIAGNOSIS — Z113 Encounter for screening for infections with a predominantly sexual mode of transmission: Secondary | ICD-10-CM | POA: Diagnosis not present

## 2016-09-24 DIAGNOSIS — G4730 Sleep apnea, unspecified: Secondary | ICD-10-CM | POA: Diagnosis not present

## 2016-09-24 DIAGNOSIS — Z79899 Other long term (current) drug therapy: Secondary | ICD-10-CM | POA: Diagnosis not present

## 2016-09-24 DIAGNOSIS — Z6841 Body Mass Index (BMI) 40.0 and over, adult: Secondary | ICD-10-CM | POA: Diagnosis not present

## 2016-09-24 DIAGNOSIS — M859 Disorder of bone density and structure, unspecified: Secondary | ICD-10-CM | POA: Diagnosis not present

## 2016-09-24 DIAGNOSIS — R350 Frequency of micturition: Secondary | ICD-10-CM | POA: Diagnosis not present

## 2016-09-24 DIAGNOSIS — E038 Other specified hypothyroidism: Secondary | ICD-10-CM | POA: Diagnosis not present

## 2016-09-24 DIAGNOSIS — I10 Essential (primary) hypertension: Secondary | ICD-10-CM | POA: Diagnosis not present

## 2016-09-24 DIAGNOSIS — Z0000 Encounter for general adult medical examination without abnormal findings: Secondary | ICD-10-CM | POA: Diagnosis not present

## 2016-09-24 DIAGNOSIS — E1140 Type 2 diabetes mellitus with diabetic neuropathy, unspecified: Secondary | ICD-10-CM | POA: Diagnosis not present

## 2016-09-24 DIAGNOSIS — E7800 Pure hypercholesterolemia, unspecified: Secondary | ICD-10-CM | POA: Diagnosis not present

## 2016-09-28 DIAGNOSIS — M8588 Other specified disorders of bone density and structure, other site: Secondary | ICD-10-CM | POA: Diagnosis not present

## 2016-09-28 DIAGNOSIS — M85851 Other specified disorders of bone density and structure, right thigh: Secondary | ICD-10-CM | POA: Diagnosis not present

## 2016-09-28 DIAGNOSIS — M859 Disorder of bone density and structure, unspecified: Secondary | ICD-10-CM | POA: Diagnosis not present

## 2016-10-01 DIAGNOSIS — R921 Mammographic calcification found on diagnostic imaging of breast: Secondary | ICD-10-CM | POA: Diagnosis not present

## 2016-11-01 ENCOUNTER — Encounter: Payer: Self-pay | Admitting: Internal Medicine

## 2016-11-01 ENCOUNTER — Ambulatory Visit (INDEPENDENT_AMBULATORY_CARE_PROVIDER_SITE_OTHER): Payer: Medicare Other | Admitting: Internal Medicine

## 2016-11-01 DIAGNOSIS — J302 Other seasonal allergic rhinitis: Secondary | ICD-10-CM | POA: Diagnosis not present

## 2016-11-01 DIAGNOSIS — I482 Chronic atrial fibrillation, unspecified: Secondary | ICD-10-CM

## 2016-11-01 DIAGNOSIS — G4733 Obstructive sleep apnea (adult) (pediatric): Secondary | ICD-10-CM

## 2016-11-01 DIAGNOSIS — J3089 Other allergic rhinitis: Secondary | ICD-10-CM | POA: Diagnosis not present

## 2016-11-01 NOTE — Assessment & Plan Note (Signed)
Rhythm is regular by exam at this visit

## 2016-11-01 NOTE — Patient Instructions (Signed)
We want to try to use CPAP more regularly, all night, every night  Ok to continue with flonase and claritin as needed  Please call if we can help

## 2016-11-01 NOTE — Assessment & Plan Note (Signed)
We reviewed her download together and discussed compliance goals. She clearly feels better using CPAP and can't sleep without it lying in bed but sometimes falls asleep without it in her recliner.

## 2016-11-01 NOTE — Assessment & Plan Note (Signed)
She considers symptom control comfortable and adequate using Claritin and Flonase. I doubt she will need to be reconsidered for allergy vaccine

## 2016-11-01 NOTE — Progress Notes (Signed)
HPI F never smoker, followed for OSA, chronic bronchitis, allergic rhinitis, complicated by nonalcoholic cirrhosis/[name] [facility],   A. Fib/"Watchman" clot- preventing device placed in her atrial appendage  CPAP auto 5-15/[facility]  04/24/2016-70 year old female never smoker followed for OSA, chronic bronchitis, allergic rhinitis, complicated by nonalcoholic cirrhosis/[name] GI, A. fib CPAP auto 5-15/[facility] FOLLOWS FOR: DME-[facility]. Pt states she wears her CPAP every night and pressure settings work well for her. She will need order for new supplies.  DL attached. Diagnosed with atrial fibrillation and has had a "Watchman" clot preventing device placed in her atrial appendage. On Coumadin for now. She is having some treatment for chronic stasis changes in her legs and is supposed to take prophylaxis before dental work. She asks if I would write standby prescription for Cleocin to have an advance of this skin work. Breathing fairly stable. Cough with scant white sputum that she first lies down and as she gets up in the morning but otherwise pretty good. CPAP download reviewed-auto 5-15/[facility]  11/01/2016-70 year old female never smoker followed for OSA, chronic bronchitis, allergic rhinitis, complicated by nonalcoholic cirrhosis/[name] GI,  AFib/"Watchman" clot- preventing device placed in her atrial appendage CPAP auto 5-15/[facility] FOLLOWS FOR: DME [facility]. Pt wears CPAP nightly; pressure works well for patient and needs order for new supplies. DL attached. We reviewed download showing good control but compliance needs to be better. She admits she often falls asleep in her recliner chair and doesn't go to bed. She can't lie down to sleep without using CPAP and finds it comfortable. Nasal symptoms well controlled with Claritin plus Flonase Currently little cough or wheeze  ROS-see HPI Constitutional:   No-   weight loss, night sweats, fevers, chills, + fatigue, lassitude. HEENT:   No-  headaches,  difficulty swallowing, tooth/dental problems, sore throat,       No-  sneezing, itching, ear ache, nasal congestion, post nasal drip,  CV:  No-   chest pain, orthopnea, PND, swelling in lower extremities, anasarca, dizziness, palpitations Resp: No-   shortness of breath with exertion or at rest.             productive cough,  No non-productive cough,  No- coughing up of blood.              No-   change in color of mucus.  No- wheezing.   Skin: No-   rash or lesions. GI:  No-   heartburn, indigestion, abdominal pain, nausea, vomiting,  GU:  MS:  + joint pain or swelling. + back pain. Neuro-     nothing unusual Psych:  No- change in mood or affect. No depression or anxiety.  No memory loss.  OBJ- Physical Exam General- Alert, Oriented, Affect-appropriate, Distress- none acute, + overweight Skin-  + ecchymoses on arms Lymphadenopathy- none Head- atraumatic            Eyes- Gross vision intact, PERRLA, conjunctivae and secretions clear            Ears- Hearing, canals-normal            Nose- Clear, no-Septal dev, mucus, polyps, erosion, perforation             Throat- Mallampati II-III , mucosa clear , drainage- none, tonsils- atrophic Neck- flexible , trachea midline, no stridor , thyroid nl, carotid no bruit Chest - symmetrical excursion , unlabored           Heart/CV- RRR + almost regular to exam , no murmur , no gallop  ,  no rub, nl s1 s2                           - JVD- none , edema- none, stasis changes +, varices- none           Lung- clear to P&A, wheeze- none, cough- none , dullness-none, rub- none           Chest wall-  Abd-  Br/ Gen/ Rectal- Not done, not indicated Extrem- cyanosis- none, clubbing, none, atrophy- none, strength- nl. +walker. Neuro- grossly intact to observation

## 2016-11-07 DIAGNOSIS — E784 Other hyperlipidemia: Secondary | ICD-10-CM | POA: Diagnosis not present

## 2016-11-07 DIAGNOSIS — I10 Essential (primary) hypertension: Secondary | ICD-10-CM | POA: Diagnosis not present

## 2016-11-07 DIAGNOSIS — I482 Chronic atrial fibrillation: Secondary | ICD-10-CM | POA: Diagnosis not present

## 2016-11-07 DIAGNOSIS — Z6837 Body mass index (BMI) 37.0-37.9, adult: Secondary | ICD-10-CM | POA: Diagnosis not present

## 2016-11-07 DIAGNOSIS — I2510 Atherosclerotic heart disease of native coronary artery without angina pectoris: Secondary | ICD-10-CM | POA: Diagnosis not present

## 2016-11-15 ENCOUNTER — Encounter: Payer: Self-pay | Admitting: Internal Medicine

## 2016-11-16 DIAGNOSIS — K7460 Unspecified cirrhosis of liver: Secondary | ICD-10-CM | POA: Diagnosis not present

## 2016-11-20 DIAGNOSIS — K7460 Unspecified cirrhosis of liver: Secondary | ICD-10-CM | POA: Diagnosis not present

## 2016-11-20 DIAGNOSIS — K7581 Nonalcoholic steatohepatitis (NASH): Secondary | ICD-10-CM | POA: Diagnosis not present

## 2016-11-20 DIAGNOSIS — R161 Splenomegaly, not elsewhere classified: Secondary | ICD-10-CM | POA: Diagnosis not present

## 2016-11-27 DIAGNOSIS — R197 Diarrhea, unspecified: Secondary | ICD-10-CM | POA: Diagnosis not present

## 2016-11-27 DIAGNOSIS — K219 Gastro-esophageal reflux disease without esophagitis: Secondary | ICD-10-CM | POA: Diagnosis not present

## 2016-11-27 DIAGNOSIS — D649 Anemia, unspecified: Secondary | ICD-10-CM | POA: Diagnosis not present

## 2016-11-27 DIAGNOSIS — K7460 Unspecified cirrhosis of liver: Secondary | ICD-10-CM | POA: Diagnosis not present

## 2016-12-03 DIAGNOSIS — F3341 Major depressive disorder, recurrent, in partial remission: Secondary | ICD-10-CM | POA: Diagnosis not present

## 2016-12-19 DIAGNOSIS — M5136 Other intervertebral disc degeneration, lumbar region: Secondary | ICD-10-CM | POA: Diagnosis not present

## 2016-12-19 DIAGNOSIS — M310 Hypersensitivity angiitis: Secondary | ICD-10-CM | POA: Diagnosis not present

## 2016-12-19 DIAGNOSIS — M797 Fibromyalgia: Secondary | ICD-10-CM | POA: Diagnosis not present

## 2016-12-19 DIAGNOSIS — M150 Primary generalized (osteo)arthritis: Secondary | ICD-10-CM | POA: Diagnosis not present

## 2016-12-25 DIAGNOSIS — Z6836 Body mass index (BMI) 36.0-36.9, adult: Secondary | ICD-10-CM | POA: Diagnosis not present

## 2016-12-25 DIAGNOSIS — I2510 Atherosclerotic heart disease of native coronary artery without angina pectoris: Secondary | ICD-10-CM | POA: Diagnosis not present

## 2016-12-25 DIAGNOSIS — E1142 Type 2 diabetes mellitus with diabetic polyneuropathy: Secondary | ICD-10-CM | POA: Diagnosis not present

## 2016-12-26 ENCOUNTER — Other Ambulatory Visit: Payer: Self-pay | Admitting: Internal Medicine

## 2016-12-26 DIAGNOSIS — M150 Primary generalized (osteo)arthritis: Secondary | ICD-10-CM | POA: Diagnosis not present

## 2016-12-26 DIAGNOSIS — M159 Polyosteoarthritis, unspecified: Secondary | ICD-10-CM | POA: Diagnosis not present

## 2016-12-26 DIAGNOSIS — E1140 Type 2 diabetes mellitus with diabetic neuropathy, unspecified: Secondary | ICD-10-CM | POA: Diagnosis not present

## 2016-12-26 DIAGNOSIS — M797 Fibromyalgia: Secondary | ICD-10-CM | POA: Diagnosis not present

## 2016-12-26 DIAGNOSIS — I10 Essential (primary) hypertension: Secondary | ICD-10-CM | POA: Diagnosis not present

## 2016-12-26 DIAGNOSIS — E7800 Pure hypercholesterolemia, unspecified: Secondary | ICD-10-CM | POA: Diagnosis not present

## 2017-01-08 DIAGNOSIS — R197 Diarrhea, unspecified: Secondary | ICD-10-CM | POA: Diagnosis not present

## 2017-02-12 DIAGNOSIS — E113293 Type 2 diabetes mellitus with mild nonproliferative diabetic retinopathy without macular edema, bilateral: Secondary | ICD-10-CM | POA: Diagnosis not present

## 2017-03-25 DIAGNOSIS — I10 Essential (primary) hypertension: Secondary | ICD-10-CM | POA: Diagnosis not present

## 2017-03-25 DIAGNOSIS — Z79899 Other long term (current) drug therapy: Secondary | ICD-10-CM | POA: Diagnosis not present

## 2017-03-25 DIAGNOSIS — E1140 Type 2 diabetes mellitus with diabetic neuropathy, unspecified: Secondary | ICD-10-CM | POA: Diagnosis not present

## 2017-03-25 DIAGNOSIS — R928 Other abnormal and inconclusive findings on diagnostic imaging of breast: Secondary | ICD-10-CM | POA: Diagnosis not present

## 2017-03-25 DIAGNOSIS — E7800 Pure hypercholesterolemia, unspecified: Secondary | ICD-10-CM | POA: Diagnosis not present

## 2017-04-02 DIAGNOSIS — F3342 Major depressive disorder, recurrent, in full remission: Secondary | ICD-10-CM | POA: Diagnosis not present

## 2017-05-02 ENCOUNTER — Ambulatory Visit: Payer: BLUE CROSS/BLUE SHIELD | Admitting: Internal Medicine

## 2017-05-30 DIAGNOSIS — K7460 Unspecified cirrhosis of liver: Secondary | ICD-10-CM | POA: Diagnosis not present

## 2017-05-30 LAB — PROTIME-INR: INR: 1 (ref 0.9–1.1)

## 2017-06-07 DIAGNOSIS — R921 Mammographic calcification found on diagnostic imaging of breast: Secondary | ICD-10-CM | POA: Diagnosis not present

## 2017-06-07 DIAGNOSIS — R928 Other abnormal and inconclusive findings on diagnostic imaging of breast: Secondary | ICD-10-CM | POA: Diagnosis not present

## 2017-06-08 DIAGNOSIS — K7460 Unspecified cirrhosis of liver: Secondary | ICD-10-CM | POA: Diagnosis not present

## 2017-06-11 DIAGNOSIS — D649 Anemia, unspecified: Secondary | ICD-10-CM | POA: Diagnosis not present

## 2017-06-11 DIAGNOSIS — R197 Diarrhea, unspecified: Secondary | ICD-10-CM | POA: Diagnosis not present

## 2017-06-11 DIAGNOSIS — K7460 Unspecified cirrhosis of liver: Secondary | ICD-10-CM | POA: Diagnosis not present

## 2017-06-11 DIAGNOSIS — K219 Gastro-esophageal reflux disease without esophagitis: Secondary | ICD-10-CM | POA: Diagnosis not present

## 2017-06-20 DIAGNOSIS — M5136 Other intervertebral disc degeneration, lumbar region: Secondary | ICD-10-CM | POA: Diagnosis not present

## 2017-06-20 DIAGNOSIS — M797 Fibromyalgia: Secondary | ICD-10-CM | POA: Diagnosis not present

## 2017-06-20 DIAGNOSIS — M150 Primary generalized (osteo)arthritis: Secondary | ICD-10-CM | POA: Diagnosis not present

## 2017-06-20 DIAGNOSIS — M25561 Pain in right knee: Secondary | ICD-10-CM | POA: Diagnosis not present

## 2017-06-20 DIAGNOSIS — M310 Hypersensitivity angiitis: Secondary | ICD-10-CM | POA: Diagnosis not present

## 2017-06-24 DIAGNOSIS — I2510 Atherosclerotic heart disease of native coronary artery without angina pectoris: Secondary | ICD-10-CM | POA: Diagnosis not present

## 2017-06-24 DIAGNOSIS — Z6837 Body mass index (BMI) 37.0-37.9, adult: Secondary | ICD-10-CM | POA: Diagnosis not present

## 2017-06-24 DIAGNOSIS — E1142 Type 2 diabetes mellitus with diabetic polyneuropathy: Secondary | ICD-10-CM | POA: Diagnosis not present

## 2017-06-28 DIAGNOSIS — E7800 Pure hypercholesterolemia, unspecified: Secondary | ICD-10-CM | POA: Diagnosis not present

## 2017-06-28 DIAGNOSIS — I10 Essential (primary) hypertension: Secondary | ICD-10-CM | POA: Diagnosis not present

## 2017-06-28 DIAGNOSIS — Z6841 Body Mass Index (BMI) 40.0 and over, adult: Secondary | ICD-10-CM | POA: Diagnosis not present

## 2017-06-28 DIAGNOSIS — R928 Other abnormal and inconclusive findings on diagnostic imaging of breast: Secondary | ICD-10-CM | POA: Diagnosis not present

## 2017-06-28 DIAGNOSIS — E1140 Type 2 diabetes mellitus with diabetic neuropathy, unspecified: Secondary | ICD-10-CM | POA: Diagnosis not present

## 2017-07-04 DIAGNOSIS — E119 Type 2 diabetes mellitus without complications: Secondary | ICD-10-CM | POA: Insufficient documentation

## 2017-07-04 DIAGNOSIS — R161 Splenomegaly, not elsewhere classified: Secondary | ICD-10-CM | POA: Insufficient documentation

## 2017-07-04 DIAGNOSIS — N393 Stress incontinence (female) (male): Secondary | ICD-10-CM | POA: Insufficient documentation

## 2017-07-04 DIAGNOSIS — I219 Acute myocardial infarction, unspecified: Secondary | ICD-10-CM | POA: Insufficient documentation

## 2017-07-04 DIAGNOSIS — M797 Fibromyalgia: Secondary | ICD-10-CM | POA: Insufficient documentation

## 2017-07-04 DIAGNOSIS — T8859XA Other complications of anesthesia, initial encounter: Secondary | ICD-10-CM | POA: Insufficient documentation

## 2017-07-04 DIAGNOSIS — T4145XA Adverse effect of unspecified anesthetic, initial encounter: Secondary | ICD-10-CM | POA: Insufficient documentation

## 2017-07-04 DIAGNOSIS — I5032 Chronic diastolic (congestive) heart failure: Secondary | ICD-10-CM | POA: Insufficient documentation

## 2017-07-04 DIAGNOSIS — G4733 Obstructive sleep apnea (adult) (pediatric): Secondary | ICD-10-CM | POA: Insufficient documentation

## 2017-07-04 DIAGNOSIS — F419 Anxiety disorder, unspecified: Secondary | ICD-10-CM | POA: Insufficient documentation

## 2017-07-04 DIAGNOSIS — G629 Polyneuropathy, unspecified: Secondary | ICD-10-CM | POA: Insufficient documentation

## 2017-07-04 DIAGNOSIS — I517 Cardiomegaly: Secondary | ICD-10-CM | POA: Insufficient documentation

## 2017-07-04 DIAGNOSIS — R400 Somnolence: Secondary | ICD-10-CM | POA: Insufficient documentation

## 2017-07-04 DIAGNOSIS — K7460 Unspecified cirrhosis of liver: Secondary | ICD-10-CM | POA: Insufficient documentation

## 2017-07-04 DIAGNOSIS — E7800 Pure hypercholesterolemia, unspecified: Secondary | ICD-10-CM | POA: Insufficient documentation

## 2017-07-04 DIAGNOSIS — I2510 Atherosclerotic heart disease of native coronary artery without angina pectoris: Secondary | ICD-10-CM | POA: Insufficient documentation

## 2017-07-04 DIAGNOSIS — I119 Hypertensive heart disease without heart failure: Secondary | ICD-10-CM | POA: Insufficient documentation

## 2017-07-04 DIAGNOSIS — M1990 Unspecified osteoarthritis, unspecified site: Secondary | ICD-10-CM | POA: Insufficient documentation

## 2017-07-04 DIAGNOSIS — G4730 Sleep apnea, unspecified: Secondary | ICD-10-CM | POA: Insufficient documentation

## 2017-07-04 DIAGNOSIS — N3944 Nocturnal enuresis: Secondary | ICD-10-CM | POA: Insufficient documentation

## 2017-07-04 DIAGNOSIS — K219 Gastro-esophageal reflux disease without esophagitis: Secondary | ICD-10-CM | POA: Insufficient documentation

## 2017-07-04 HISTORY — DX: Gastro-esophageal reflux disease without esophagitis: K21.9

## 2017-07-04 NOTE — Progress Notes (Signed)
Cardiology Office Note:    Date:  07/05/2017   ID:  [name], DOB 1946-07-14, MRN [id number]  PCP:  Greig Right, MD  Cardiologist:  Shirlee More, MD    Referring MD: Greig Right, MD    ASSESSMENT:    1. Chronic atrial fibrillation ([place])   2. Hypertensive heart disease with chronic diastolic congestive heart failure ([place])   3. Chronic diastolic heart failure (HCC)    PLAN:    In order of problems listed above:  1. Stable rate controlled continue her beta blocker she has had watchman device and will continue antiplatelet therapy with aspirin in view of her liver disease 2. Stable blood pressure target continue current treatment involving increased dose carvedilol calcium channel blocker and ARB diuretic 3. Stable continue current medications   Next appointment: 6 months   Medication Adjustments/Labs and Tests Ordered: Current medicines are reviewed at length with the patient today.  Concerns regarding medicines are outlined above.  Orders Placed This Encounter  Procedures  . EKG 12-Lead   Meds ordered this encounter  Medications  . aspirin EC 81 MG tablet    Sig: Take 1 tablet (81 mg total) by mouth daily.    Dispense:  30 tablet    Refill:  6    Chief Complaint  Patient presents with  . Follow-up    Routine flup appt   . Atrial Fibrillation  . Congestive Heart Failure  . Hypertension    History of Present Illness:    [name] [name] is a 71 y.o. female with a hx of CAD, Chronic Atrial Fibrillation with Watchman device in May 2017, Dyslipidemia, HTN and cirrhosis  last seen in December 2017.recently her BP was out of range and carvedilol dose was increased. She had stopped her aspirin and agrees to restart.She's had no edema weight gain orthopnea palpitations syncope TIA or remain short of breath when walking out doors. Compliance with diet, lifestyle and medications: Yes Past Medical History:  Diagnosis Date  . Allergic rhinitis   . Anxiety   .  ANXIETY 12/26/2007   Qualifier: Diagnosis of  By: Ronnald Ramp CNA/MA, Janett Billow    . Arthritis   . Bronchitis   . CHF (congestive heart failure) ([place])   . CHF, MILD 12/26/2007   Qualifier: Diagnosis of  By: Ronnald Ramp CNA/MA, Janett Billow    . Chronic atrial fibrillation (HCC) 04/24/2016   Watchman atrial appendage device placed at [facility] for clot pevention  . Chronic liver disease 03/15/2016  . Complication of anesthesia    low blood pressure once  . Depression   . Enlarged heart   . Essential hypertension 12/26/2007   Qualifier: Diagnosis of  By: Ronnald Ramp CNA/MA, Janett Billow    . Fibromyalgia   . GERD (gastroesophageal reflux disease)   . HTN (hypertension)    on medication since age 36  . Hypercholesteremia   . Hyperlipidemia 02/29/2016  . Hypothyroidism 03/20/2016  . Idiopathic cirrhosis (HCC)    stage 4; sees [name] in [place]  . Mild CAD 02/29/2016  . Myocardial infarction [facility])    age 45  . Neuropathy, peripheral    lower extremities  . OSA (obstructive sleep apnea)   . OSA on CPAP 07/04/2008   CPAP AutoSet/ [facility]   . Seasonal and perennial allergic rhinitis 01/23/2011   Allergy vaccine restarted at 1:50 08/02/2011 [place], [place] 2016   . Sleep apnea   . Somnolence   . SOMNOLENCE 12/26/2007   Annotation: excessive daytime Qualifier: Diagnosis of  By:  Ronnald Ramp CNA/MA, Janett Billow    . Splenomegaly   . Type II or unspecified type diabetes mellitus without mention of complication, not stated as uncontrolled   . Urinary incontinence, nocturnal enuresis   . Urinary, incontinence, stress female    wears depends    Past Surgical History:  Procedure Laterality Date  . ABDOMINAL HYSTERECTOMY    . APPENDECTOMY    . BACK SURGERY    . CARPAL TUNNEL RELEASE     bilaterally  . CATARACT EXTRACTION W/ INTRAOCULAR LENS  IMPLANT, BILATERAL    . DILATION AND CURETTAGE OF UTERUS    . EYE SURGERY     cataract ext/ iol implants  . KNEE ARTHROSCOPY    . LUMBAR LAMINECTOMY  03/2011; 01/2012  . MOUTH SURGERY    . SPLENECTOMY      . TONSILLECTOMY AND ADENOIDECTOMY    . TOTAL ABDOMINAL HYSTERECTOMY      Current Medications: Current Meds  Medication Sig  . acarbose (PRECOSE) 100 MG tablet Take 100 mg by mouth 3 (three) times daily with meals.  Marland Kitchen alendronate (FOSAMAX) 70 MG tablet Take 70 mg by mouth once a week.   Marland Kitchen buPROPion (WELLBUTRIN XL) 300 MG 24 hr tablet Take 300 mg by mouth daily.  . carvedilol (COREG) 25 MG tablet Take 25 mg by mouth 2 (two) times daily.  . clindamycin (CLEOCIN) 300 MG capsule TAKE 2 CAPSULES BY MOUTH 1 HOUR BEFORE PROCEDURE.  Marland Kitchen CRESTOR 5 MG tablet Take 5 mg by mouth daily.   . diflunisal (DOLOBID) 500 MG TABS Take 1 tablet by mouth 2 (two) times daily.  Marland Kitchen diltiazem (DILACOR XR) 180 MG 24 hr capsule Take 180 mg by mouth at bedtime.  Marland Kitchen doxazosin (CARDURA) 4 MG tablet Take 4 mg by mouth at bedtime.  . ergocalciferol (VITAMIN D2) 50000 UNITS capsule Take 50,000 Units by mouth once a week. Monday  . gabapentin (NEURONTIN) 100 MG capsule Take 600 mg by mouth 4 (four) times daily.   . irbesartan-hydrochlorothiazide (AVALIDE) 300-12.5 MG tablet Take 1 tablet by mouth daily.  . isosorbide mononitrate (IMDUR) 60 MG 24 hr tablet Take 60 mg by mouth daily.  . JENTADUETO 2.5-500 MG TABS Take 1 tablet by mouth 2 (two) times daily.  Marland Kitchen levothyroxine (SYNTHROID, LEVOTHROID) 125 MCG tablet Take 125 mcg by mouth daily before breakfast.   . magnesium gluconate (MAGONATE) 500 MG tablet Take 500 mg by mouth once a week.  Marland Kitchen NITROSTAT 0.4 MG SL tablet Place 0.4 mg under the tongue every 5 (five) minutes as needed for chest pain.   Marland Kitchen omeprazole (PRILOSEC) 40 MG capsule Take 1 capsule by mouth 2 (two) times daily.  . potassium chloride SA (K-DUR,KLOR-CON) 20 MEQ tablet Take 40 mEq by mouth 3 (three) times daily.   . traMADol (ULTRAM) 50 MG tablet Take 50 mg by mouth every 6 (six) hours as needed. For pain  . venlafaxine XR (EFFEXOR-XR) 150 MG 24 hr capsule Take 150 mg by mouth daily with breakfast.       Allergies:   Azithromycin; Cefuroxime axetil; Celecoxib; Codeine; Sulfa antibiotics; and Hydralazine hcl   Social History   Social History  . Marital status: Married    Spouse name: N/A  . Number of children: N/A  . Years of education: N/A   Social History Main Topics  . Smoking status: Never Smoker  . Smokeless tobacco: Never Used  . Alcohol use No  . Drug use: No  . Sexual activity: Not Asked  Other Topics Concern  . None   Social History Narrative  . None     Family History: The patient's family history includes Anesthesia problems in her mother; CAD in her father; Cancer in her mother; Diabetes in her paternal grandmother; Emphysema in her mother; Heart attack in her father; Hypertension in her father; Rheum arthritis in her mother; Stroke in her father. ROS:   Please see the history of present illness.    All other systems reviewed and are negative.  EKGs/Labs/Other Studies Reviewed:    The following studies were reviewed today:  EKG:  EKG ordered today.  The ekg ordered today demonstrates Atrial fibrillation controlled ventricular rate  Recent Labs: requested from her PCP No results found for requested labs within last 8760 hours.  Recent Lipid Panel No results found for: CHOL, TRIG, HDL, CHOLHDL, VLDL, LDLCALC, LDLDIRECT  Physical Exam:    VS:  BP 130/72 (BP Location: Right Arm, Patient Position: Sitting)   Pulse (!) 59   Ht 5\' 3"  (1.6 m)   Wt 218 lb 6.4 oz (99.1 kg)   SpO2 97%   BMI 38.69 kg/m     Wt Readings from Last 3 Encounters:  07/05/17 218 lb 6.4 oz (99.1 kg)  11/01/16 218 lb 6.4 oz (99.1 kg)  09/13/16 211 lb 11.2 oz (96 kg)     GEN: looks chronically ill   in no acute distress HEENT: Normal NECK: No JVD; No carotid bruits LYMPHATICS: No lymphadenopathy CARDIAC: RRR, no murmurs, rubs, gallops RESPIRATORY:  Clear to auscultation without rales, wheezing or rhonchi  ABDOMEN: Soft, non-tender, non-distended MUSCULOSKELETAL:  No edema; No  deformity  SKIN: Warm and dry NEUROLOGIC:  Alert and oriented x 3 PSYCHIATRIC:  Normal affect    Signed, Shirlee More, MD  07/05/2017 11:40 AM    [place]

## 2017-07-05 ENCOUNTER — Encounter: Payer: Self-pay | Admitting: Cardiology

## 2017-07-05 ENCOUNTER — Ambulatory Visit (INDEPENDENT_AMBULATORY_CARE_PROVIDER_SITE_OTHER): Payer: Medicare Other | Admitting: Cardiology

## 2017-07-05 VITALS — BP 130/72 | HR 59 | Ht 63.0 in | Wt 218.4 lb

## 2017-07-05 DIAGNOSIS — I110 Hypertensive heart disease with heart failure: Secondary | ICD-10-CM

## 2017-07-05 DIAGNOSIS — I482 Chronic atrial fibrillation, unspecified: Secondary | ICD-10-CM

## 2017-07-05 DIAGNOSIS — I5032 Chronic diastolic (congestive) heart failure: Secondary | ICD-10-CM

## 2017-07-05 MED ORDER — ASPIRIN EC 81 MG PO TBEC: 81.0000 mg | DELAYED_RELEASE_TABLET | Freq: Every day | ORAL | 6 refills | Status: DC

## 2017-07-05 NOTE — Patient Instructions (Addendum)
Medication Instructions:  Your physician has recommended you make the following change in your medication:  RESTART aspirin 81 mg daily   Labwork: None  Testing/Procedures: You had an EKG today.  Follow-Up: Your physician wants you to follow-up in: 6 months. You will receive a reminder letter in the mail two months in advance. If you don't receive a letter, please call our office to schedule the follow-up appointment.   Any Other Special Instructions Will Be Listed Below (If Applicable).     If you need a refill on your cardiac medications before your next appointment, please call your pharmacy.    Heart Failure  Weigh yourself every morning when you first wake up and record on a calender or note pad, bring this to your office visits. Using a pill tender can help with taking your medications consistently.  Limit your fluid intake to 2 liters daily  Limit your sodium intake to less than 2-3 grams daily. Ask if you need dietary teaching.  If you gain more than 3 pounds (from your dry weight ), double your dose of diuretic for the day.  If you gain more than 5 pounds (from your dry weight), double your dose of lasix and call your heart failure doctor.  Please do not smoke tobacco since it is very bad for your heart.  Please do not drink alcohol since it can worsen your heart failure.Also avoid OTC nonsteroidal drugs, such as advil, aleve and motrin.  Try to exercise for at least 30 minutes every day because this will help your heart be more efficient. You may be eligible for supervised cardiac rehab, ask your physician.

## 2017-07-12 ENCOUNTER — Encounter: Payer: Self-pay | Admitting: Internal Medicine

## 2017-07-12 ENCOUNTER — Ambulatory Visit (INDEPENDENT_AMBULATORY_CARE_PROVIDER_SITE_OTHER): Payer: Medicare Other | Admitting: Internal Medicine

## 2017-07-12 DIAGNOSIS — J3089 Other allergic rhinitis: Secondary | ICD-10-CM | POA: Diagnosis not present

## 2017-07-12 DIAGNOSIS — J302 Other seasonal allergic rhinitis: Secondary | ICD-10-CM

## 2017-07-12 DIAGNOSIS — Z23 Encounter for immunization: Secondary | ICD-10-CM | POA: Diagnosis not present

## 2017-07-12 DIAGNOSIS — G4733 Obstructive sleep apnea (adult) (pediatric): Secondary | ICD-10-CM

## 2017-07-12 NOTE — Progress Notes (Signed)
HPI [name] never smoker, followed for OSA, chronic bronchitis, allergic rhinitis, complicated by nonalcoholic cirrhosis/[name] GI,   A. Fib/"Watchman" clot- preventing device placed in her atrial appendage , HBP, MI, chronic diastolic CHF, GERD, DM 2, hypothyroid NPSG 11/05/01-AHI 93/hour, desaturation to 63%, body weight 217 pounds PFT 07/26/09-mild restriction and reduction of diffusion. No obstruction, no response to dilator -----------------------------------------------------------------------------------  11/01/2016-71 year old [name] [name] never smoker followed for OSA, chronic bronchitis, allergic rhinitis, complicated by nonalcoholic cirrhosis/[name] GI,  AFib/"Watchman" clot- preventing device placed in her atrial appendage CPAP auto 5-15/[facility] FOLLOWS FOR: DME APS. Pt wears CPAP nightly; pressure works well for patient and needs order for new supplies. DL attached. We reviewed download showing good control but compliance needs to be better. She admits she often falls asleep in her recliner chair and doesn't go to bed. She can't lie down to sleep without using CPAP and finds it comfortable. Nasal symptoms well controlled with Claritin plus Flonase Currently little cough or wheeze  07/12/17- 71 year old [name] [name] never smoker followed for OSA, chronic bronchitis, allergic rhinitis, complicated by nonalcoholic cirrhosis/[name] GI,  AFib/"Watchman" clot- preventing device placed in her atrial appendage, HBP, MI, chronic diastolic CHF, GERD, DM 2, hypothyroid CPAP auto 5-15/[facility] Pt states that she has been doing good. 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 DME: [facility].  Download shows 33% compliance with AHI 9.9/hour. She says she is restless at night, moves around, and "it comes off". Admits she falls asleep in her chair often and stays there for the night short of breath when she first lies down, otherwise breathing is  comfortable with no acute events. Not much rhinitis this spring. She doesn't feel she needs her inhalers any longer and isn't using them.  ROS-see HPI  + = positive Constitutional:   No-   weight loss, night sweats, fevers, chills, + fatigue, lassitude. HEENT:   No-  headaches, difficulty swallowing, tooth/dental problems, sore throat,       No-  sneezing, itching, ear ache, nasal congestion, post nasal drip,  CV:  No-   chest pain, orthopnea, PND, swelling in lower extremities, anasarca, dizziness, palpitations Resp: No-   shortness of breath with exertion or at rest.             productive cough,  No non-productive cough,  No- coughing up of blood.              No-   change in color of mucus.  No- wheezing.   Skin: No-   rash or lesions. GI:  No-   heartburn, indigestion, abdominal pain, nausea, vomiting,  GU:  MS:  + joint pain or swelling. + back pain. Neuro-     nothing unusual Psych:  No- change in mood or affect. No depression or anxiety.  No memory loss.  OBJ- Physical Exam General- Alert, Oriented, Affect-appropriate, Distress- none acute, + overweight Skin-  + ecchymoses on arms Lymphadenopathy- none Head- atraumatic            Eyes- Gross vision intact, PERRLA, conjunctivae and secretions clear            Ears- Hearing, canals-normal            Nose- Clear, no-Septal dev, mucus, polyps, erosion, perforation             Throat- Mallampati II-III , mucosa clear , drainage- none, tonsils- atrophic Neck- flexible , trachea midline, no stridor ,  thyroid nl, carotid no bruit Chest - symmetrical excursion , unlabored           Heart/CV- RRR + almost regular to exam , no murmur , no gallop  , no rub, nl s1 s2                           - JVD- none , edema- none, stasis changes +, varices- none           Lung- clear to P&A, wheeze- none, cough- none , dullness-none, rub- none           Chest wall-  Abd-  Br/ Gen/ Rectal- Not done, not indicated Extrem- cyanosis- none, clubbing,  none, atrophy- none, strength- nl. + Rolling walker. Neuro- grossly intact to observation

## 2017-07-12 NOTE — Patient Instructions (Addendum)
Our goal is to use your CPAP all night, every night. Try to put yourself to bed on a regular schedule, before you fall asleep in the chair. If you get enough sleep with CPAP at night, you won't be dozing off all the time.  Please call if we can help  Flu vax

## 2017-07-14 NOTE — Assessment & Plan Note (Signed)
Rhinitis symptoms have been much less in the last couple of years. We did discuss availability of Flonase and antihistamines OTC if needed.

## 2017-07-14 NOTE — Assessment & Plan Note (Signed)
Insufficient CPAP compliance. Part of the problem is poor sleep habit with irregular sleep schedule and tendency to let her self fall asleep in her chair for the night. We discussed this, emphasizing goals and medical reasons. She denies discomfort.

## 2017-08-01 DIAGNOSIS — F3341 Major depressive disorder, recurrent, in partial remission: Secondary | ICD-10-CM | POA: Diagnosis not present

## 2017-08-13 DIAGNOSIS — H524 Presbyopia: Secondary | ICD-10-CM | POA: Diagnosis not present

## 2017-08-13 DIAGNOSIS — E113293 Type 2 diabetes mellitus with mild nonproliferative diabetic retinopathy without macular edema, bilateral: Secondary | ICD-10-CM | POA: Diagnosis not present

## 2017-08-21 ENCOUNTER — Other Ambulatory Visit: Payer: Self-pay

## 2017-08-21 MED ORDER — DILTIAZEM HCL ER 180 MG PO CP24: 180.0000 mg | ORAL_CAPSULE | Freq: Every day | ORAL | 11 refills | Status: DC

## 2017-09-04 ENCOUNTER — Other Ambulatory Visit: Payer: Self-pay

## 2017-09-04 MED ORDER — CRESTOR 5 MG PO TABS: 5.0000 mg | ORAL_TABLET | Freq: Every day | ORAL | 11 refills | Status: DC

## 2017-09-23 DIAGNOSIS — N3001 Acute cystitis with hematuria: Secondary | ICD-10-CM | POA: Diagnosis not present

## 2017-10-15 DIAGNOSIS — Z0000 Encounter for general adult medical examination without abnormal findings: Secondary | ICD-10-CM | POA: Diagnosis not present

## 2017-10-15 DIAGNOSIS — E7800 Pure hypercholesterolemia, unspecified: Secondary | ICD-10-CM | POA: Diagnosis not present

## 2017-10-15 DIAGNOSIS — Z6841 Body Mass Index (BMI) 40.0 and over, adult: Secondary | ICD-10-CM | POA: Diagnosis not present

## 2017-10-15 DIAGNOSIS — E1140 Type 2 diabetes mellitus with diabetic neuropathy, unspecified: Secondary | ICD-10-CM | POA: Diagnosis not present

## 2017-10-15 DIAGNOSIS — Z9181 History of falling: Secondary | ICD-10-CM | POA: Diagnosis not present

## 2017-10-15 DIAGNOSIS — F324 Major depressive disorder, single episode, in partial remission: Secondary | ICD-10-CM | POA: Diagnosis not present

## 2017-10-15 DIAGNOSIS — E038 Other specified hypothyroidism: Secondary | ICD-10-CM | POA: Diagnosis not present

## 2017-10-15 DIAGNOSIS — Z79899 Other long term (current) drug therapy: Secondary | ICD-10-CM | POA: Diagnosis not present

## 2017-10-15 DIAGNOSIS — E113293 Type 2 diabetes mellitus with mild nonproliferative diabetic retinopathy without macular edema, bilateral: Secondary | ICD-10-CM | POA: Diagnosis not present

## 2017-10-15 DIAGNOSIS — I10 Essential (primary) hypertension: Secondary | ICD-10-CM | POA: Diagnosis not present

## 2017-10-15 DIAGNOSIS — F325 Major depressive disorder, single episode, in full remission: Secondary | ICD-10-CM | POA: Diagnosis not present

## 2017-10-18 DIAGNOSIS — Z9181 History of falling: Secondary | ICD-10-CM | POA: Diagnosis not present

## 2017-10-18 DIAGNOSIS — M62552 Muscle wasting and atrophy, not elsewhere classified, left thigh: Secondary | ICD-10-CM | POA: Diagnosis not present

## 2017-10-18 DIAGNOSIS — M62551 Muscle wasting and atrophy, not elsewhere classified, right thigh: Secondary | ICD-10-CM | POA: Diagnosis not present

## 2017-10-18 DIAGNOSIS — R2689 Other abnormalities of gait and mobility: Secondary | ICD-10-CM | POA: Diagnosis not present

## 2017-10-22 DIAGNOSIS — Z9181 History of falling: Secondary | ICD-10-CM | POA: Diagnosis not present

## 2017-10-22 DIAGNOSIS — M62551 Muscle wasting and atrophy, not elsewhere classified, right thigh: Secondary | ICD-10-CM | POA: Diagnosis not present

## 2017-10-22 DIAGNOSIS — M62552 Muscle wasting and atrophy, not elsewhere classified, left thigh: Secondary | ICD-10-CM | POA: Diagnosis not present

## 2017-10-22 DIAGNOSIS — R2689 Other abnormalities of gait and mobility: Secondary | ICD-10-CM | POA: Diagnosis not present

## 2017-10-24 DIAGNOSIS — R2689 Other abnormalities of gait and mobility: Secondary | ICD-10-CM | POA: Diagnosis not present

## 2017-10-24 DIAGNOSIS — M62552 Muscle wasting and atrophy, not elsewhere classified, left thigh: Secondary | ICD-10-CM | POA: Diagnosis not present

## 2017-10-24 DIAGNOSIS — M62551 Muscle wasting and atrophy, not elsewhere classified, right thigh: Secondary | ICD-10-CM | POA: Diagnosis not present

## 2017-10-24 DIAGNOSIS — Z9181 History of falling: Secondary | ICD-10-CM | POA: Diagnosis not present

## 2017-10-25 DIAGNOSIS — R2689 Other abnormalities of gait and mobility: Secondary | ICD-10-CM | POA: Diagnosis not present

## 2017-10-25 DIAGNOSIS — M62551 Muscle wasting and atrophy, not elsewhere classified, right thigh: Secondary | ICD-10-CM | POA: Diagnosis not present

## 2017-10-25 DIAGNOSIS — Z9181 History of falling: Secondary | ICD-10-CM | POA: Diagnosis not present

## 2017-10-25 DIAGNOSIS — M62552 Muscle wasting and atrophy, not elsewhere classified, left thigh: Secondary | ICD-10-CM | POA: Diagnosis not present

## 2017-11-01 DIAGNOSIS — M62552 Muscle wasting and atrophy, not elsewhere classified, left thigh: Secondary | ICD-10-CM | POA: Diagnosis not present

## 2017-11-01 DIAGNOSIS — R2689 Other abnormalities of gait and mobility: Secondary | ICD-10-CM | POA: Diagnosis not present

## 2017-11-01 DIAGNOSIS — Z9181 History of falling: Secondary | ICD-10-CM | POA: Diagnosis not present

## 2017-11-01 DIAGNOSIS — M62551 Muscle wasting and atrophy, not elsewhere classified, right thigh: Secondary | ICD-10-CM | POA: Diagnosis not present

## 2017-11-04 DIAGNOSIS — R2689 Other abnormalities of gait and mobility: Secondary | ICD-10-CM | POA: Diagnosis not present

## 2017-11-04 DIAGNOSIS — M62551 Muscle wasting and atrophy, not elsewhere classified, right thigh: Secondary | ICD-10-CM | POA: Diagnosis not present

## 2017-11-04 DIAGNOSIS — Z9181 History of falling: Secondary | ICD-10-CM | POA: Diagnosis not present

## 2017-11-04 DIAGNOSIS — M62552 Muscle wasting and atrophy, not elsewhere classified, left thigh: Secondary | ICD-10-CM | POA: Diagnosis not present

## 2017-11-06 DIAGNOSIS — R2689 Other abnormalities of gait and mobility: Secondary | ICD-10-CM | POA: Diagnosis not present

## 2017-11-06 DIAGNOSIS — M62551 Muscle wasting and atrophy, not elsewhere classified, right thigh: Secondary | ICD-10-CM | POA: Diagnosis not present

## 2017-11-06 DIAGNOSIS — M62552 Muscle wasting and atrophy, not elsewhere classified, left thigh: Secondary | ICD-10-CM | POA: Diagnosis not present

## 2017-11-06 DIAGNOSIS — Z9181 History of falling: Secondary | ICD-10-CM | POA: Diagnosis not present

## 2017-11-14 DIAGNOSIS — R2689 Other abnormalities of gait and mobility: Secondary | ICD-10-CM | POA: Diagnosis not present

## 2017-11-14 DIAGNOSIS — Z9181 History of falling: Secondary | ICD-10-CM | POA: Diagnosis not present

## 2017-11-14 DIAGNOSIS — M62552 Muscle wasting and atrophy, not elsewhere classified, left thigh: Secondary | ICD-10-CM | POA: Diagnosis not present

## 2017-11-14 DIAGNOSIS — M62551 Muscle wasting and atrophy, not elsewhere classified, right thigh: Secondary | ICD-10-CM | POA: Diagnosis not present

## 2017-11-18 DIAGNOSIS — M62551 Muscle wasting and atrophy, not elsewhere classified, right thigh: Secondary | ICD-10-CM | POA: Diagnosis not present

## 2017-11-18 DIAGNOSIS — M62552 Muscle wasting and atrophy, not elsewhere classified, left thigh: Secondary | ICD-10-CM | POA: Diagnosis not present

## 2017-11-18 DIAGNOSIS — G8911 Acute pain due to trauma: Secondary | ICD-10-CM | POA: Diagnosis not present

## 2017-11-18 DIAGNOSIS — S81811A Laceration without foreign body, right lower leg, initial encounter: Secondary | ICD-10-CM | POA: Diagnosis not present

## 2017-11-18 DIAGNOSIS — Z9181 History of falling: Secondary | ICD-10-CM | POA: Diagnosis not present

## 2017-11-18 DIAGNOSIS — R2689 Other abnormalities of gait and mobility: Secondary | ICD-10-CM | POA: Diagnosis not present

## 2017-11-18 DIAGNOSIS — S4990XA Unspecified injury of shoulder and upper arm, unspecified arm, initial encounter: Secondary | ICD-10-CM | POA: Diagnosis not present

## 2017-11-21 DIAGNOSIS — M62552 Muscle wasting and atrophy, not elsewhere classified, left thigh: Secondary | ICD-10-CM | POA: Diagnosis not present

## 2017-11-21 DIAGNOSIS — M62551 Muscle wasting and atrophy, not elsewhere classified, right thigh: Secondary | ICD-10-CM | POA: Diagnosis not present

## 2017-11-21 DIAGNOSIS — R2689 Other abnormalities of gait and mobility: Secondary | ICD-10-CM | POA: Diagnosis not present

## 2017-11-21 DIAGNOSIS — Z9181 History of falling: Secondary | ICD-10-CM | POA: Diagnosis not present

## 2017-11-26 DIAGNOSIS — Z9181 History of falling: Secondary | ICD-10-CM | POA: Diagnosis not present

## 2017-11-26 DIAGNOSIS — M62551 Muscle wasting and atrophy, not elsewhere classified, right thigh: Secondary | ICD-10-CM | POA: Diagnosis not present

## 2017-11-26 DIAGNOSIS — R2689 Other abnormalities of gait and mobility: Secondary | ICD-10-CM | POA: Diagnosis not present

## 2017-11-26 DIAGNOSIS — M62552 Muscle wasting and atrophy, not elsewhere classified, left thigh: Secondary | ICD-10-CM | POA: Diagnosis not present

## 2017-11-28 DIAGNOSIS — I4891 Unspecified atrial fibrillation: Secondary | ICD-10-CM | POA: Diagnosis not present

## 2017-11-28 DIAGNOSIS — I10 Essential (primary) hypertension: Secondary | ICD-10-CM | POA: Diagnosis not present

## 2017-11-28 DIAGNOSIS — Z9181 History of falling: Secondary | ICD-10-CM | POA: Diagnosis not present

## 2017-11-28 DIAGNOSIS — M62552 Muscle wasting and atrophy, not elsewhere classified, left thigh: Secondary | ICD-10-CM | POA: Diagnosis not present

## 2017-11-28 DIAGNOSIS — M62551 Muscle wasting and atrophy, not elsewhere classified, right thigh: Secondary | ICD-10-CM | POA: Diagnosis not present

## 2017-11-28 DIAGNOSIS — G4730 Sleep apnea, unspecified: Secondary | ICD-10-CM | POA: Diagnosis not present

## 2017-11-28 DIAGNOSIS — L97229 Non-pressure chronic ulcer of left calf with unspecified severity: Secondary | ICD-10-CM | POA: Diagnosis not present

## 2017-11-28 DIAGNOSIS — I252 Old myocardial infarction: Secondary | ICD-10-CM | POA: Diagnosis not present

## 2017-11-28 DIAGNOSIS — M1990 Unspecified osteoarthritis, unspecified site: Secondary | ICD-10-CM | POA: Diagnosis not present

## 2017-11-28 DIAGNOSIS — E11622 Type 2 diabetes mellitus with other skin ulcer: Secondary | ICD-10-CM | POA: Diagnosis not present

## 2017-11-28 DIAGNOSIS — F3342 Major depressive disorder, recurrent, in full remission: Secondary | ICD-10-CM | POA: Diagnosis not present

## 2017-11-28 DIAGNOSIS — K7469 Other cirrhosis of liver: Secondary | ICD-10-CM | POA: Diagnosis not present

## 2017-11-28 DIAGNOSIS — R2689 Other abnormalities of gait and mobility: Secondary | ICD-10-CM | POA: Diagnosis not present

## 2017-11-28 DIAGNOSIS — E1140 Type 2 diabetes mellitus with diabetic neuropathy, unspecified: Secondary | ICD-10-CM | POA: Diagnosis not present

## 2017-11-28 DIAGNOSIS — S81811A Laceration without foreign body, right lower leg, initial encounter: Secondary | ICD-10-CM | POA: Diagnosis not present

## 2017-11-28 DIAGNOSIS — I2510 Atherosclerotic heart disease of native coronary artery without angina pectoris: Secondary | ICD-10-CM | POA: Diagnosis not present

## 2017-11-28 DIAGNOSIS — Z7984 Long term (current) use of oral hypoglycemic drugs: Secondary | ICD-10-CM | POA: Diagnosis not present

## 2017-11-28 DIAGNOSIS — L97822 Non-pressure chronic ulcer of other part of left lower leg with fat layer exposed: Secondary | ICD-10-CM | POA: Diagnosis not present

## 2017-11-28 DIAGNOSIS — M797 Fibromyalgia: Secondary | ICD-10-CM | POA: Diagnosis not present

## 2017-12-02 DIAGNOSIS — S81811A Laceration without foreign body, right lower leg, initial encounter: Secondary | ICD-10-CM | POA: Diagnosis not present

## 2017-12-02 DIAGNOSIS — L97229 Non-pressure chronic ulcer of left calf with unspecified severity: Secondary | ICD-10-CM | POA: Diagnosis not present

## 2017-12-02 DIAGNOSIS — E11622 Type 2 diabetes mellitus with other skin ulcer: Secondary | ICD-10-CM | POA: Diagnosis not present

## 2017-12-03 DIAGNOSIS — M62551 Muscle wasting and atrophy, not elsewhere classified, right thigh: Secondary | ICD-10-CM | POA: Diagnosis not present

## 2017-12-03 DIAGNOSIS — M62552 Muscle wasting and atrophy, not elsewhere classified, left thigh: Secondary | ICD-10-CM | POA: Diagnosis not present

## 2017-12-03 DIAGNOSIS — R2689 Other abnormalities of gait and mobility: Secondary | ICD-10-CM | POA: Diagnosis not present

## 2017-12-03 DIAGNOSIS — Z9181 History of falling: Secondary | ICD-10-CM | POA: Diagnosis not present

## 2017-12-05 DIAGNOSIS — E119 Type 2 diabetes mellitus without complications: Secondary | ICD-10-CM | POA: Diagnosis not present

## 2017-12-05 DIAGNOSIS — S81811A Laceration without foreign body, right lower leg, initial encounter: Secondary | ICD-10-CM | POA: Diagnosis not present

## 2017-12-06 DIAGNOSIS — R2689 Other abnormalities of gait and mobility: Secondary | ICD-10-CM | POA: Diagnosis not present

## 2017-12-06 DIAGNOSIS — Z9181 History of falling: Secondary | ICD-10-CM | POA: Diagnosis not present

## 2017-12-06 DIAGNOSIS — M62551 Muscle wasting and atrophy, not elsewhere classified, right thigh: Secondary | ICD-10-CM | POA: Diagnosis not present

## 2017-12-06 DIAGNOSIS — M62552 Muscle wasting and atrophy, not elsewhere classified, left thigh: Secondary | ICD-10-CM | POA: Diagnosis not present

## 2017-12-09 DIAGNOSIS — Z9181 History of falling: Secondary | ICD-10-CM | POA: Diagnosis not present

## 2017-12-09 DIAGNOSIS — I252 Old myocardial infarction: Secondary | ICD-10-CM | POA: Diagnosis not present

## 2017-12-09 DIAGNOSIS — E1140 Type 2 diabetes mellitus with diabetic neuropathy, unspecified: Secondary | ICD-10-CM | POA: Diagnosis not present

## 2017-12-09 DIAGNOSIS — M1990 Unspecified osteoarthritis, unspecified site: Secondary | ICD-10-CM | POA: Diagnosis not present

## 2017-12-09 DIAGNOSIS — R2689 Other abnormalities of gait and mobility: Secondary | ICD-10-CM | POA: Diagnosis not present

## 2017-12-09 DIAGNOSIS — Z7984 Long term (current) use of oral hypoglycemic drugs: Secondary | ICD-10-CM | POA: Diagnosis not present

## 2017-12-09 DIAGNOSIS — G4730 Sleep apnea, unspecified: Secondary | ICD-10-CM | POA: Diagnosis not present

## 2017-12-09 DIAGNOSIS — M62552 Muscle wasting and atrophy, not elsewhere classified, left thigh: Secondary | ICD-10-CM | POA: Diagnosis not present

## 2017-12-09 DIAGNOSIS — S81811A Laceration without foreign body, right lower leg, initial encounter: Secondary | ICD-10-CM | POA: Diagnosis not present

## 2017-12-09 DIAGNOSIS — K7469 Other cirrhosis of liver: Secondary | ICD-10-CM | POA: Diagnosis not present

## 2017-12-09 DIAGNOSIS — I2510 Atherosclerotic heart disease of native coronary artery without angina pectoris: Secondary | ICD-10-CM | POA: Diagnosis not present

## 2017-12-09 DIAGNOSIS — M62551 Muscle wasting and atrophy, not elsewhere classified, right thigh: Secondary | ICD-10-CM | POA: Diagnosis not present

## 2017-12-09 DIAGNOSIS — I10 Essential (primary) hypertension: Secondary | ICD-10-CM | POA: Diagnosis not present

## 2017-12-13 DIAGNOSIS — M62552 Muscle wasting and atrophy, not elsewhere classified, left thigh: Secondary | ICD-10-CM | POA: Diagnosis not present

## 2017-12-13 DIAGNOSIS — M62551 Muscle wasting and atrophy, not elsewhere classified, right thigh: Secondary | ICD-10-CM | POA: Diagnosis not present

## 2017-12-13 DIAGNOSIS — Z9181 History of falling: Secondary | ICD-10-CM | POA: Diagnosis not present

## 2017-12-13 DIAGNOSIS — R2689 Other abnormalities of gait and mobility: Secondary | ICD-10-CM | POA: Diagnosis not present

## 2017-12-16 DIAGNOSIS — E1140 Type 2 diabetes mellitus with diabetic neuropathy, unspecified: Secondary | ICD-10-CM | POA: Diagnosis not present

## 2017-12-16 DIAGNOSIS — S81811A Laceration without foreign body, right lower leg, initial encounter: Secondary | ICD-10-CM | POA: Diagnosis not present

## 2017-12-16 DIAGNOSIS — S81801A Unspecified open wound, right lower leg, initial encounter: Secondary | ICD-10-CM | POA: Diagnosis not present

## 2017-12-17 DIAGNOSIS — R161 Splenomegaly, not elsewhere classified: Secondary | ICD-10-CM | POA: Diagnosis not present

## 2017-12-17 DIAGNOSIS — K7460 Unspecified cirrhosis of liver: Secondary | ICD-10-CM | POA: Diagnosis not present

## 2017-12-18 DIAGNOSIS — M62551 Muscle wasting and atrophy, not elsewhere classified, right thigh: Secondary | ICD-10-CM | POA: Diagnosis not present

## 2017-12-18 DIAGNOSIS — R2689 Other abnormalities of gait and mobility: Secondary | ICD-10-CM | POA: Diagnosis not present

## 2017-12-18 DIAGNOSIS — Z9181 History of falling: Secondary | ICD-10-CM | POA: Diagnosis not present

## 2017-12-18 DIAGNOSIS — M62552 Muscle wasting and atrophy, not elsewhere classified, left thigh: Secondary | ICD-10-CM | POA: Diagnosis not present

## 2017-12-20 DIAGNOSIS — Z9181 History of falling: Secondary | ICD-10-CM | POA: Diagnosis not present

## 2017-12-20 DIAGNOSIS — M62551 Muscle wasting and atrophy, not elsewhere classified, right thigh: Secondary | ICD-10-CM | POA: Diagnosis not present

## 2017-12-20 DIAGNOSIS — R2689 Other abnormalities of gait and mobility: Secondary | ICD-10-CM | POA: Diagnosis not present

## 2017-12-20 DIAGNOSIS — M62552 Muscle wasting and atrophy, not elsewhere classified, left thigh: Secondary | ICD-10-CM | POA: Diagnosis not present

## 2017-12-23 DIAGNOSIS — M150 Primary generalized (osteo)arthritis: Secondary | ICD-10-CM | POA: Diagnosis not present

## 2017-12-23 DIAGNOSIS — Z6841 Body Mass Index (BMI) 40.0 and over, adult: Secondary | ICD-10-CM | POA: Diagnosis not present

## 2017-12-23 DIAGNOSIS — I10 Essential (primary) hypertension: Secondary | ICD-10-CM | POA: Diagnosis not present

## 2017-12-23 DIAGNOSIS — M797 Fibromyalgia: Secondary | ICD-10-CM | POA: Diagnosis not present

## 2017-12-23 DIAGNOSIS — E7800 Pure hypercholesterolemia, unspecified: Secondary | ICD-10-CM | POA: Diagnosis not present

## 2017-12-23 DIAGNOSIS — R609 Edema, unspecified: Secondary | ICD-10-CM | POA: Diagnosis not present

## 2017-12-23 DIAGNOSIS — E113293 Type 2 diabetes mellitus with mild nonproliferative diabetic retinopathy without macular edema, bilateral: Secondary | ICD-10-CM | POA: Diagnosis not present

## 2017-12-24 DIAGNOSIS — K7460 Unspecified cirrhosis of liver: Secondary | ICD-10-CM | POA: Diagnosis not present

## 2017-12-24 DIAGNOSIS — D649 Anemia, unspecified: Secondary | ICD-10-CM | POA: Diagnosis not present

## 2017-12-24 DIAGNOSIS — K219 Gastro-esophageal reflux disease without esophagitis: Secondary | ICD-10-CM | POA: Diagnosis not present

## 2017-12-24 DIAGNOSIS — Z1212 Encounter for screening for malignant neoplasm of rectum: Secondary | ICD-10-CM | POA: Diagnosis not present

## 2017-12-24 DIAGNOSIS — K580 Irritable bowel syndrome with diarrhea: Secondary | ICD-10-CM | POA: Diagnosis not present

## 2017-12-26 DIAGNOSIS — S81811A Laceration without foreign body, right lower leg, initial encounter: Secondary | ICD-10-CM | POA: Diagnosis not present

## 2017-12-26 DIAGNOSIS — Z87828 Personal history of other (healed) physical injury and trauma: Secondary | ICD-10-CM | POA: Diagnosis not present

## 2017-12-26 DIAGNOSIS — Z09 Encounter for follow-up examination after completed treatment for conditions other than malignant neoplasm: Secondary | ICD-10-CM | POA: Diagnosis not present

## 2017-12-26 DIAGNOSIS — E119 Type 2 diabetes mellitus without complications: Secondary | ICD-10-CM | POA: Diagnosis not present

## 2017-12-26 DIAGNOSIS — I2510 Atherosclerotic heart disease of native coronary artery without angina pectoris: Secondary | ICD-10-CM | POA: Diagnosis not present

## 2017-12-26 DIAGNOSIS — Z7984 Long term (current) use of oral hypoglycemic drugs: Secondary | ICD-10-CM | POA: Diagnosis not present

## 2017-12-26 DIAGNOSIS — Z6838 Body mass index (BMI) 38.0-38.9, adult: Secondary | ICD-10-CM | POA: Diagnosis not present

## 2017-12-26 DIAGNOSIS — E1142 Type 2 diabetes mellitus with diabetic polyneuropathy: Secondary | ICD-10-CM | POA: Diagnosis not present

## 2018-01-02 DIAGNOSIS — Z9181 History of falling: Secondary | ICD-10-CM | POA: Diagnosis not present

## 2018-01-02 DIAGNOSIS — M62552 Muscle wasting and atrophy, not elsewhere classified, left thigh: Secondary | ICD-10-CM | POA: Diagnosis not present

## 2018-01-02 DIAGNOSIS — R2689 Other abnormalities of gait and mobility: Secondary | ICD-10-CM | POA: Diagnosis not present

## 2018-01-02 DIAGNOSIS — M62551 Muscle wasting and atrophy, not elsewhere classified, right thigh: Secondary | ICD-10-CM | POA: Diagnosis not present

## 2018-01-07 DIAGNOSIS — M310 Hypersensitivity angiitis: Secondary | ICD-10-CM | POA: Diagnosis not present

## 2018-01-07 DIAGNOSIS — M5136 Other intervertebral disc degeneration, lumbar region: Secondary | ICD-10-CM | POA: Diagnosis not present

## 2018-01-07 DIAGNOSIS — M1712 Unilateral primary osteoarthritis, left knee: Secondary | ICD-10-CM | POA: Diagnosis not present

## 2018-01-07 DIAGNOSIS — M797 Fibromyalgia: Secondary | ICD-10-CM | POA: Diagnosis not present

## 2018-01-07 DIAGNOSIS — M150 Primary generalized (osteo)arthritis: Secondary | ICD-10-CM | POA: Diagnosis not present

## 2018-01-07 DIAGNOSIS — M1711 Unilateral primary osteoarthritis, right knee: Secondary | ICD-10-CM | POA: Diagnosis not present

## 2018-01-14 DIAGNOSIS — R2689 Other abnormalities of gait and mobility: Secondary | ICD-10-CM | POA: Diagnosis not present

## 2018-01-14 DIAGNOSIS — Z9181 History of falling: Secondary | ICD-10-CM | POA: Diagnosis not present

## 2018-01-14 DIAGNOSIS — M62551 Muscle wasting and atrophy, not elsewhere classified, right thigh: Secondary | ICD-10-CM | POA: Diagnosis not present

## 2018-01-14 DIAGNOSIS — M62552 Muscle wasting and atrophy, not elsewhere classified, left thigh: Secondary | ICD-10-CM | POA: Diagnosis not present

## 2018-01-16 DIAGNOSIS — Z9181 History of falling: Secondary | ICD-10-CM | POA: Diagnosis not present

## 2018-01-16 DIAGNOSIS — M62552 Muscle wasting and atrophy, not elsewhere classified, left thigh: Secondary | ICD-10-CM | POA: Diagnosis not present

## 2018-01-16 DIAGNOSIS — R2689 Other abnormalities of gait and mobility: Secondary | ICD-10-CM | POA: Diagnosis not present

## 2018-01-16 DIAGNOSIS — M62551 Muscle wasting and atrophy, not elsewhere classified, right thigh: Secondary | ICD-10-CM | POA: Diagnosis not present

## 2018-01-28 DIAGNOSIS — M150 Primary generalized (osteo)arthritis: Secondary | ICD-10-CM | POA: Diagnosis not present

## 2018-01-28 DIAGNOSIS — M1712 Unilateral primary osteoarthritis, left knee: Secondary | ICD-10-CM | POA: Diagnosis not present

## 2018-01-28 DIAGNOSIS — M797 Fibromyalgia: Secondary | ICD-10-CM | POA: Diagnosis not present

## 2018-01-28 DIAGNOSIS — M1711 Unilateral primary osteoarthritis, right knee: Secondary | ICD-10-CM | POA: Diagnosis not present

## 2018-01-28 DIAGNOSIS — M5136 Other intervertebral disc degeneration, lumbar region: Secondary | ICD-10-CM | POA: Diagnosis not present

## 2018-01-28 DIAGNOSIS — M310 Hypersensitivity angiitis: Secondary | ICD-10-CM | POA: Diagnosis not present

## 2018-01-30 ENCOUNTER — Emergency Department (HOSPITAL_BASED_OUTPATIENT_CLINIC_OR_DEPARTMENT_OTHER): Payer: Medicare Other

## 2018-01-30 ENCOUNTER — Encounter (HOSPITAL_BASED_OUTPATIENT_CLINIC_OR_DEPARTMENT_OTHER): Payer: Self-pay | Admitting: Emergency Medicine

## 2018-01-30 ENCOUNTER — Ambulatory Visit (INDEPENDENT_AMBULATORY_CARE_PROVIDER_SITE_OTHER): Payer: Medicare Other | Admitting: Cardiology

## 2018-01-30 ENCOUNTER — Encounter: Payer: Self-pay | Admitting: Cardiology

## 2018-01-30 ENCOUNTER — Other Ambulatory Visit: Payer: Self-pay

## 2018-01-30 ENCOUNTER — Emergency Department (HOSPITAL_BASED_OUTPATIENT_CLINIC_OR_DEPARTMENT_OTHER)
Admission: EM | Admit: 2018-01-30 | Discharge: 2018-01-30 | Disposition: A | Discharge status: Home / Self Care | Payer: Medicare Other | Attending: Emergency Medicine | Admitting: Emergency Medicine

## 2018-01-30 VITALS — BP 130/78 | HR 52 | Ht 63.0 in | Wt 231.0 lb

## 2018-01-30 DIAGNOSIS — Z7982 Long term (current) use of aspirin: Secondary | ICD-10-CM | POA: Diagnosis not present

## 2018-01-30 DIAGNOSIS — S82141A Displaced bicondylar fracture of right tibia, initial encounter for closed fracture: Secondary | ICD-10-CM

## 2018-01-30 DIAGNOSIS — I482 Chronic atrial fibrillation, unspecified: Secondary | ICD-10-CM

## 2018-01-30 DIAGNOSIS — M25461 Effusion, right knee: Secondary | ICD-10-CM | POA: Insufficient documentation

## 2018-01-30 DIAGNOSIS — W19XXXA Unspecified fall, initial encounter: Secondary | ICD-10-CM | POA: Diagnosis not present

## 2018-01-30 DIAGNOSIS — Z95818 Presence of other cardiac implants and grafts: Secondary | ICD-10-CM | POA: Diagnosis not present

## 2018-01-30 DIAGNOSIS — S82144A Nondisplaced bicondylar fracture of right tibia, initial encounter for closed fracture: Secondary | ICD-10-CM | POA: Diagnosis not present

## 2018-01-30 DIAGNOSIS — E119 Type 2 diabetes mellitus without complications: Secondary | ICD-10-CM | POA: Diagnosis not present

## 2018-01-30 DIAGNOSIS — Z79899 Other long term (current) drug therapy: Secondary | ICD-10-CM | POA: Insufficient documentation

## 2018-01-30 DIAGNOSIS — S8991XA Unspecified injury of right lower leg, initial encounter: Secondary | ICD-10-CM | POA: Diagnosis present

## 2018-01-30 DIAGNOSIS — Y939 Activity, unspecified: Secondary | ICD-10-CM | POA: Insufficient documentation

## 2018-01-30 DIAGNOSIS — Y929 Unspecified place or not applicable: Secondary | ICD-10-CM | POA: Insufficient documentation

## 2018-01-30 DIAGNOSIS — Y999 Unspecified external cause status: Secondary | ICD-10-CM | POA: Diagnosis not present

## 2018-01-30 DIAGNOSIS — S8001XA Contusion of right knee, initial encounter: Secondary | ICD-10-CM | POA: Diagnosis not present

## 2018-01-30 DIAGNOSIS — I5032 Chronic diastolic (congestive) heart failure: Secondary | ICD-10-CM | POA: Insufficient documentation

## 2018-01-30 DIAGNOSIS — I10 Essential (primary) hypertension: Secondary | ICD-10-CM | POA: Diagnosis not present

## 2018-01-30 DIAGNOSIS — I110 Hypertensive heart disease with heart failure: Secondary | ICD-10-CM | POA: Diagnosis not present

## 2018-01-30 DIAGNOSIS — S82101A Unspecified fracture of upper end of right tibia, initial encounter for closed fracture: Secondary | ICD-10-CM | POA: Diagnosis not present

## 2018-01-30 HISTORY — DX: Effusion, right knee: M25.461

## 2018-01-30 HISTORY — DX: Presence of other cardiac implants and grafts: Z95.818

## 2018-01-30 IMAGING — CR DG KNEE COMPLETE 4+V*R*
4 series · 4 of 4 positions shown · non-contrast
Comparison: Back no recent prior.

CLINICAL DATA: Pain and difficulty standing after fall.

EXAM:
RIGHT KNEE - COMPLETE 4+ VIEW

[t knee ap right]
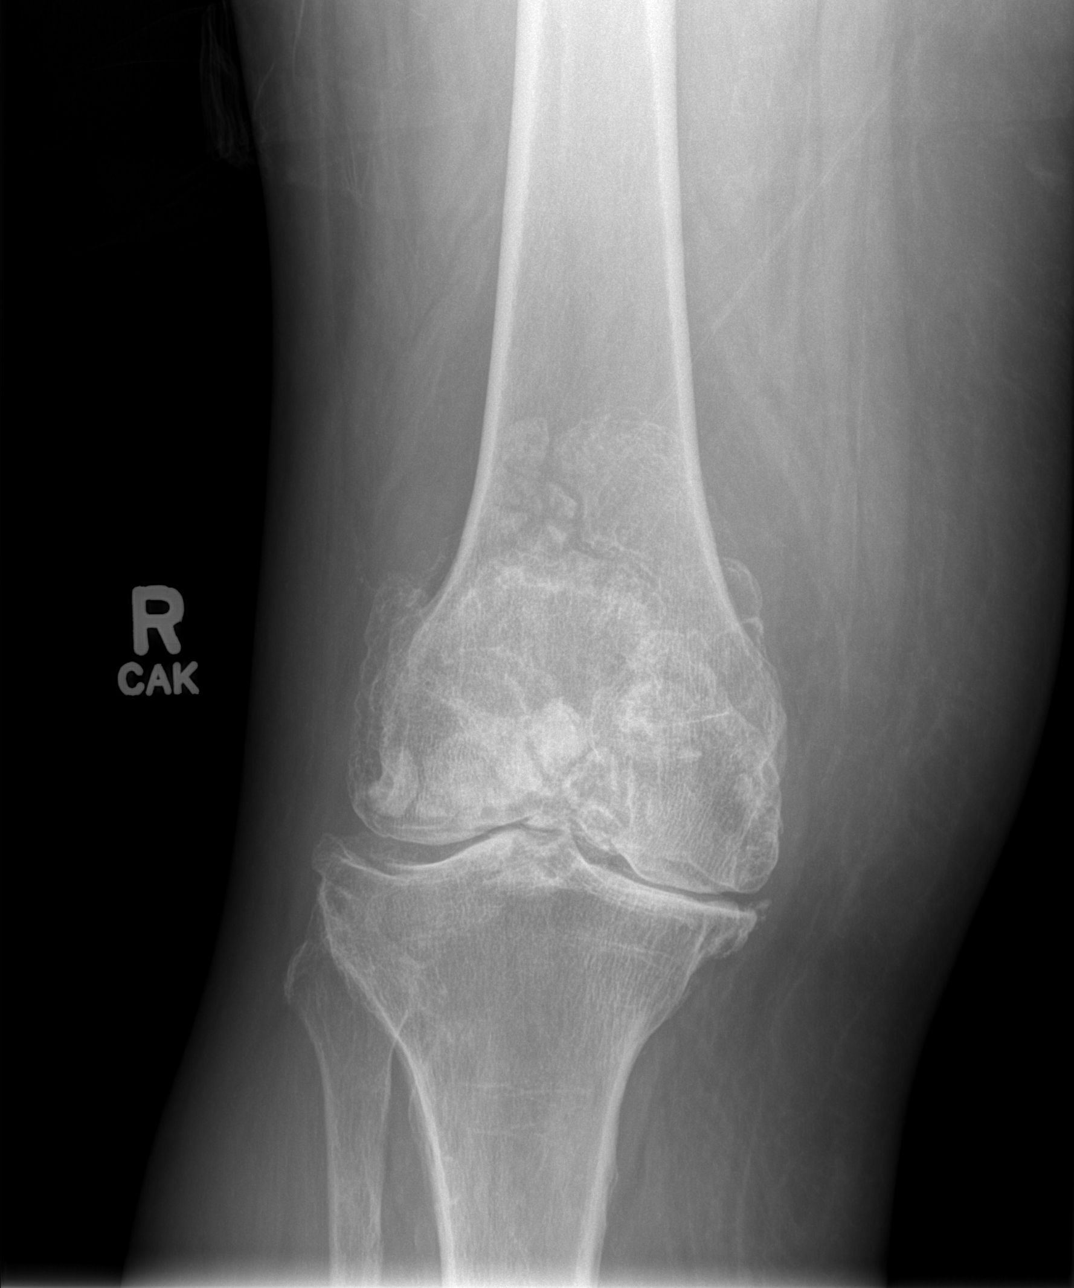

[t knee oblique right (1 of 2)]
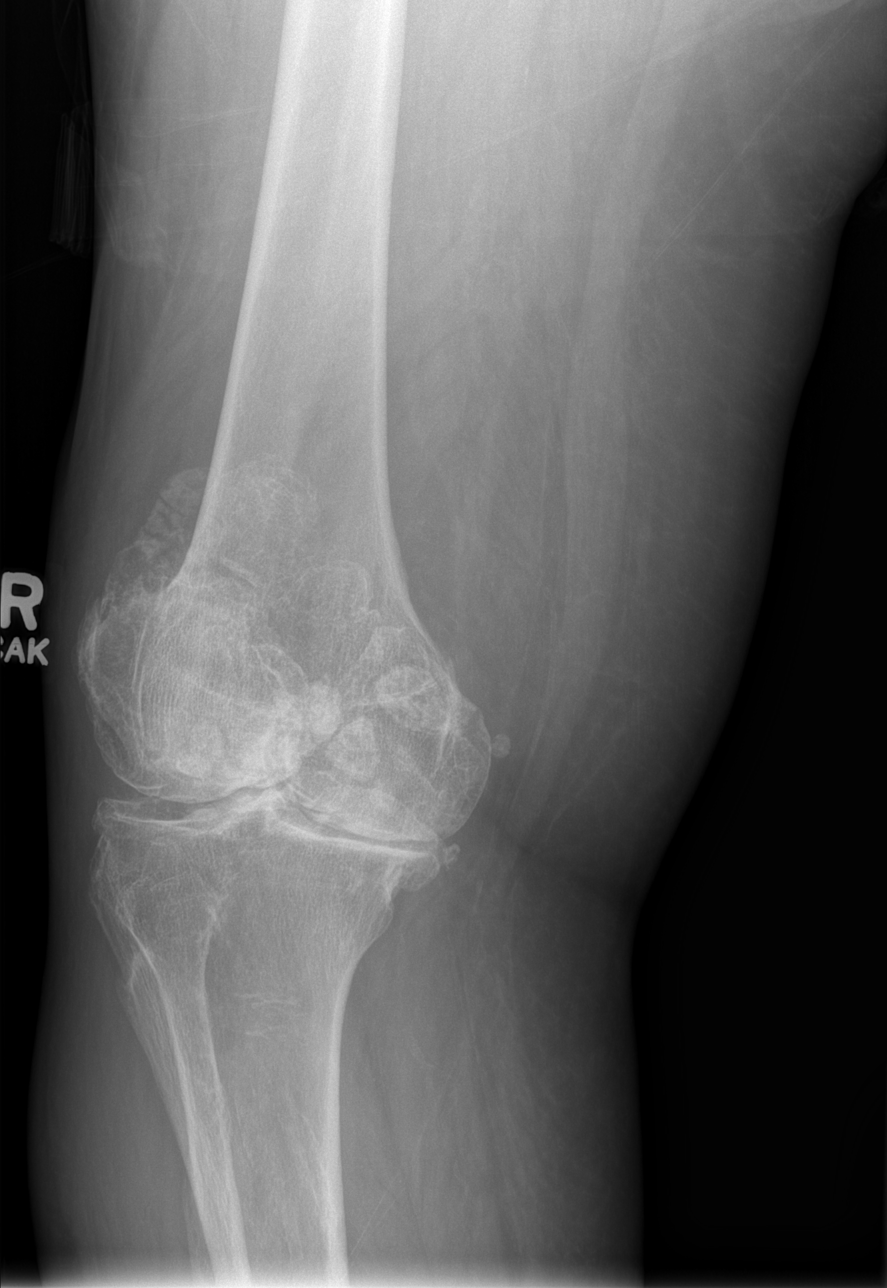

[t knee oblique right (2 of 2)]
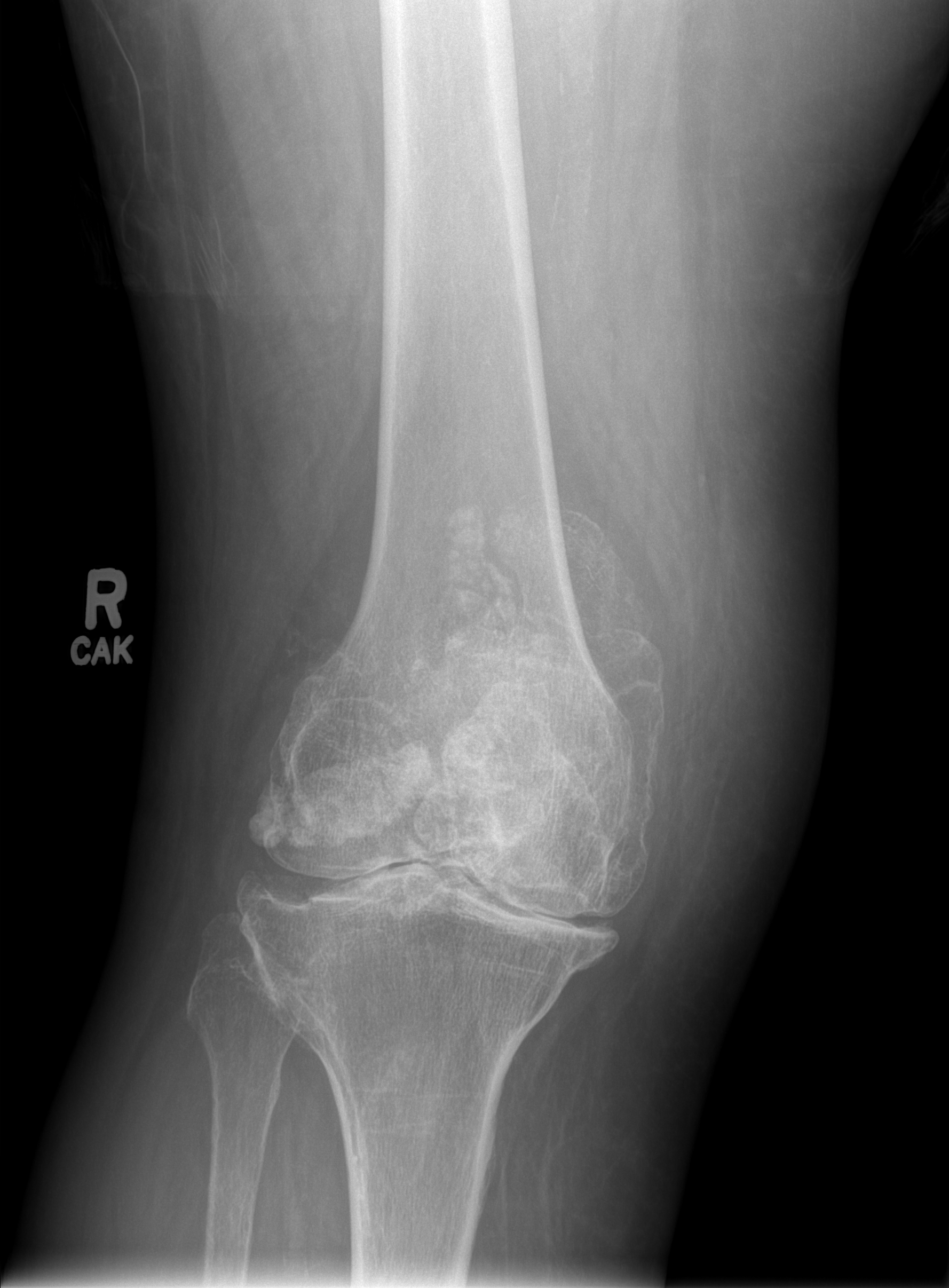

[t knee lat right]
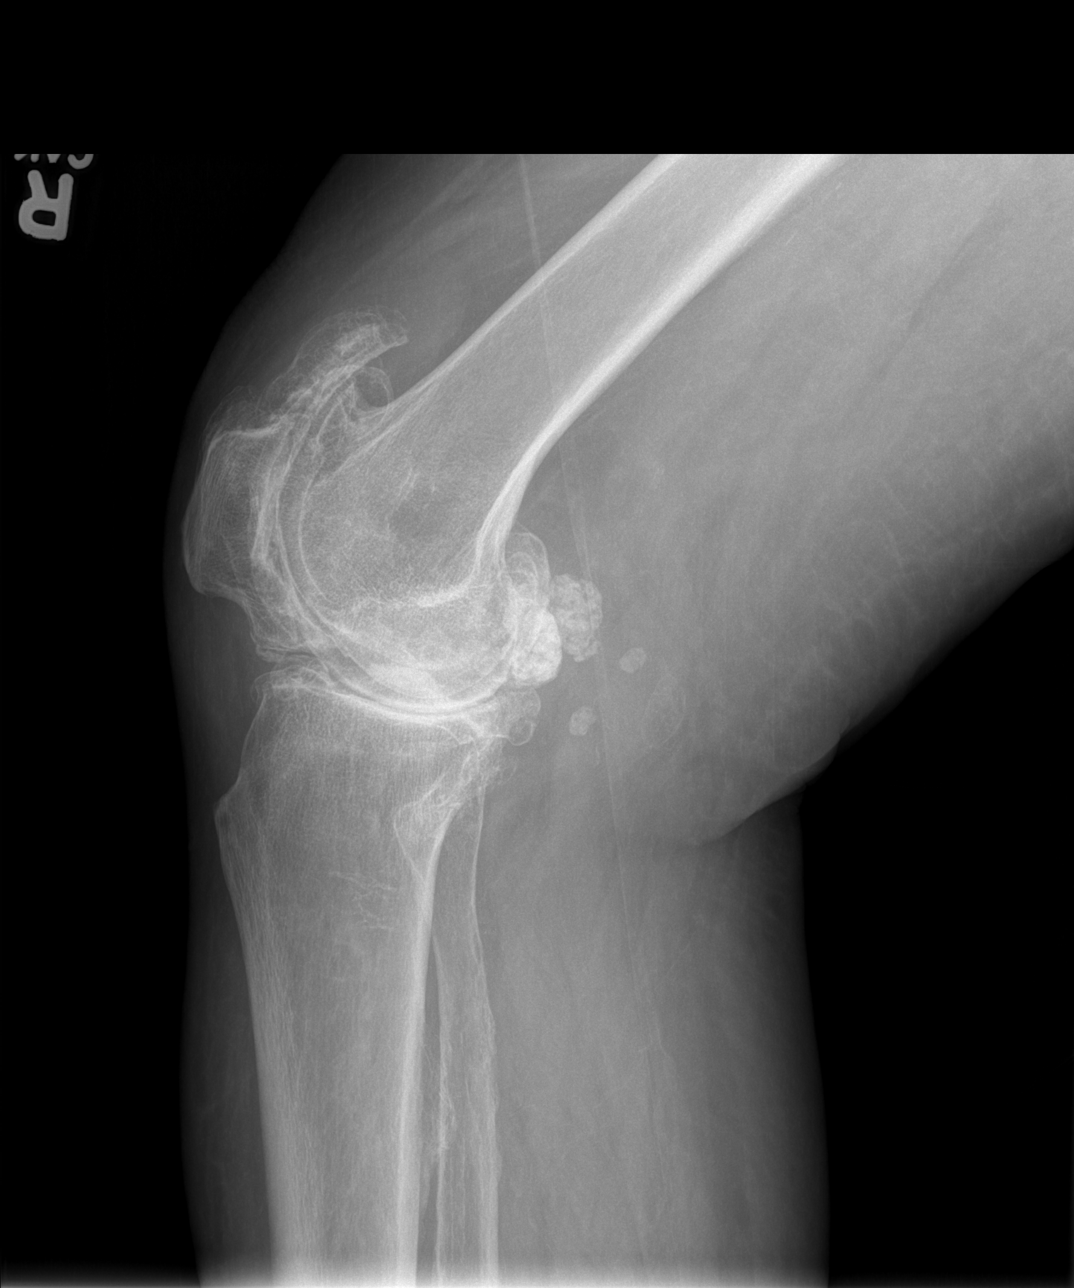

[4 of 4 positions shown; findings below may reference images not displayed]

FINDINGS: Severe diffuse tricompartment degenerative change with large loose
bodies and/or synovial osteochondromatosis noted. No evidence of
acute fracture or dislocation. Small knee joint effusion present.
IMPRESSION: 1. Severe diffuse tricompartment degenerative change with prominent
loose bodies and/or synovial osteochondromatosis.

2. Small knee joint effusion. No evidence of acute fracture or
dislocation.

## 2018-01-30 IMAGING — CT CT KNEE*R* W/O CM
3 series · 12 of 35 positions shown, 14 images · non-contrast
Comparison: 01/30/2018 radiographs

CLINICAL DATA: Fall 1 week ago with right knee pain and swelling.
Difficulty weight-bearing. Severe degenerative arthropathy on
radiography.

EXAM:
CT OF THE right KNEE WITHOUT CONTRAST
TECHNIQUE: Multidetector CT imaging of the right knee was performed according
to the standard protocol. Multiplanar CT image reconstructions were
also generated.

[Series 5: axial st · axial · 0.37mm/px · z∈[-1106,-915]mm · 4 of 279 slices shown, 5 images]
[im 43/279  soft-tissue]
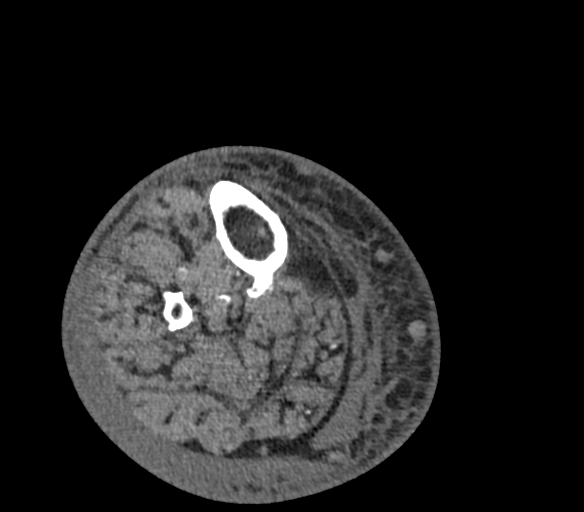
[im 43/279  bone]
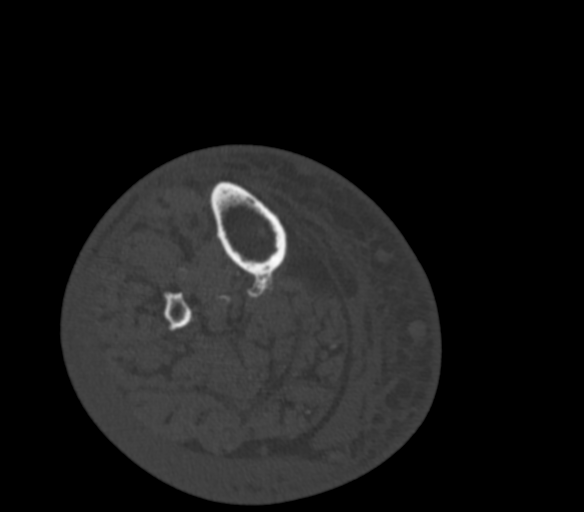
[im 107/279  bone]
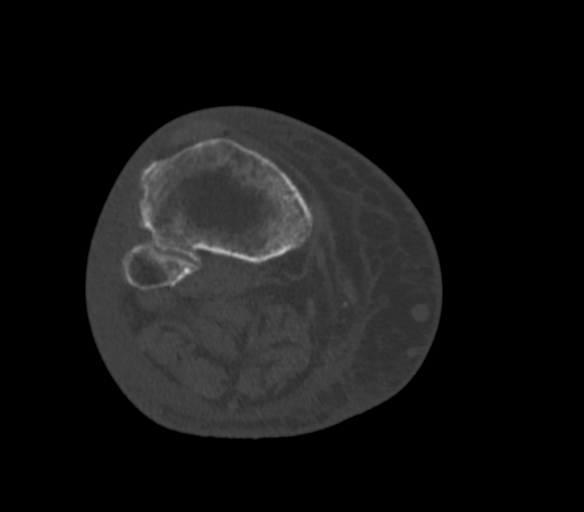
[im 172/279  bone]
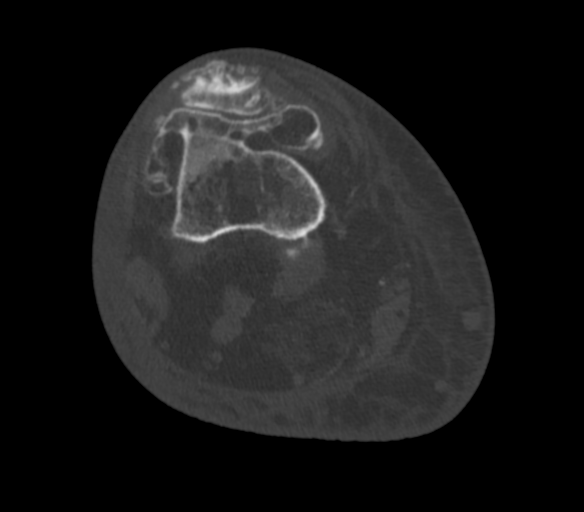
[im 236/279  bone]
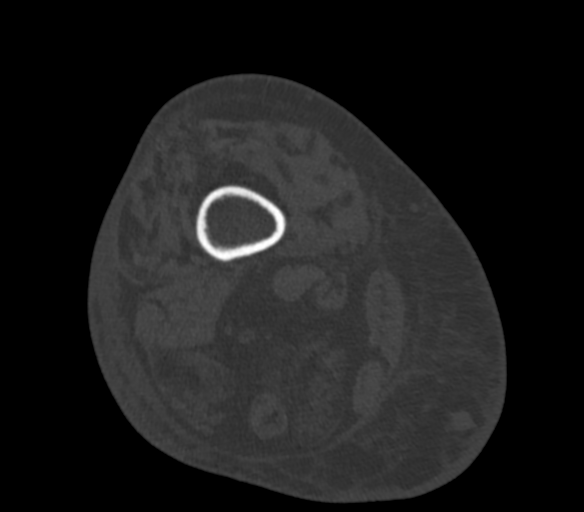

[Series 8: cor st · coronal · 0.43mm/px · 3 of 192 slices shown]
[im 39/192  bone]
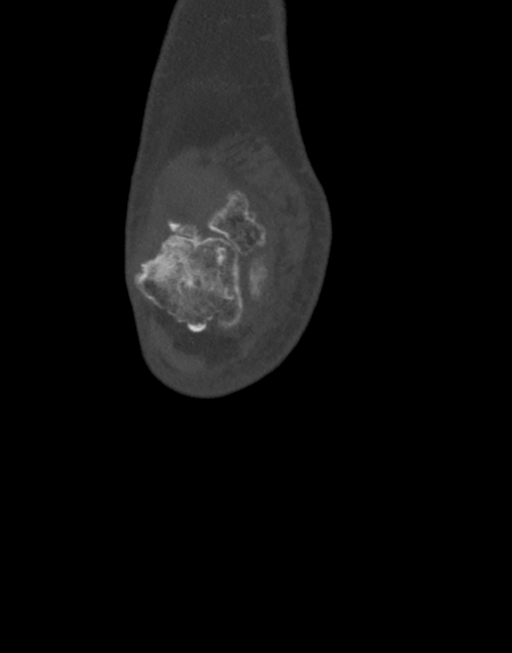
[im 77/192  bone]
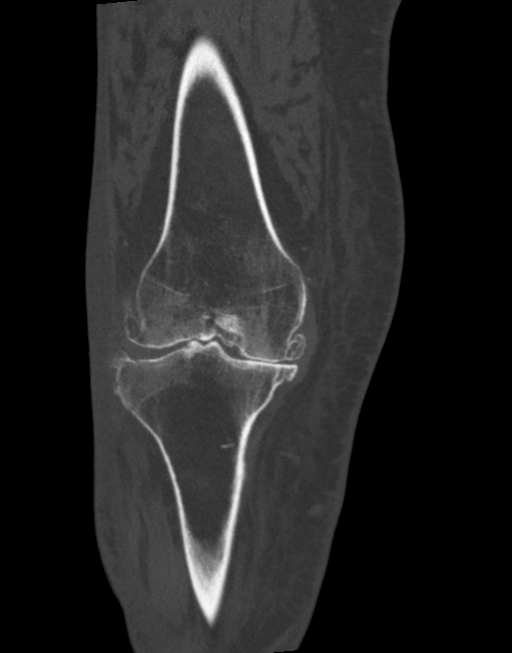
[im 115/192  bone]
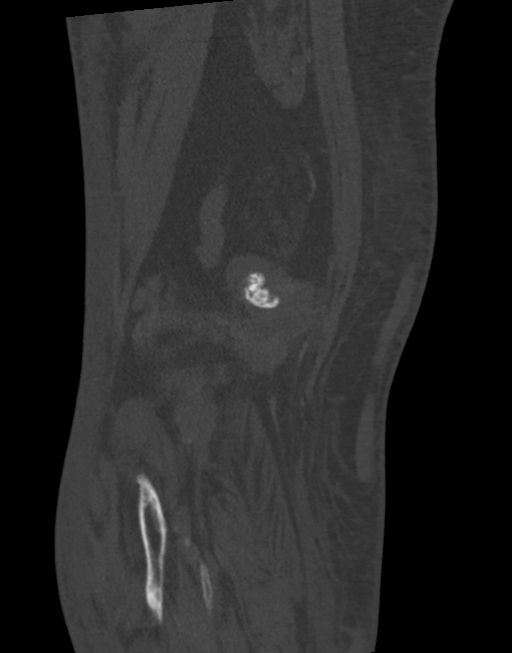

[Series 9: sag st · sagittal · 0.37mm/px · 5 of 219 slices shown, 6 images]
[im 73/219  bone]
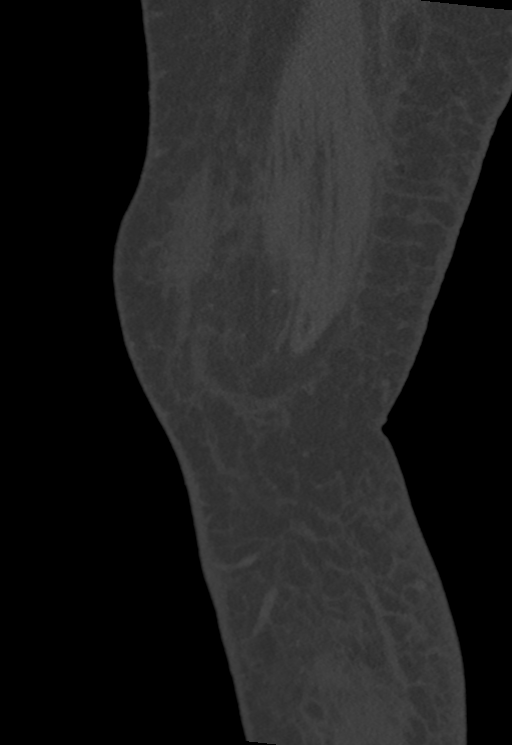
[im 91/219  bone]
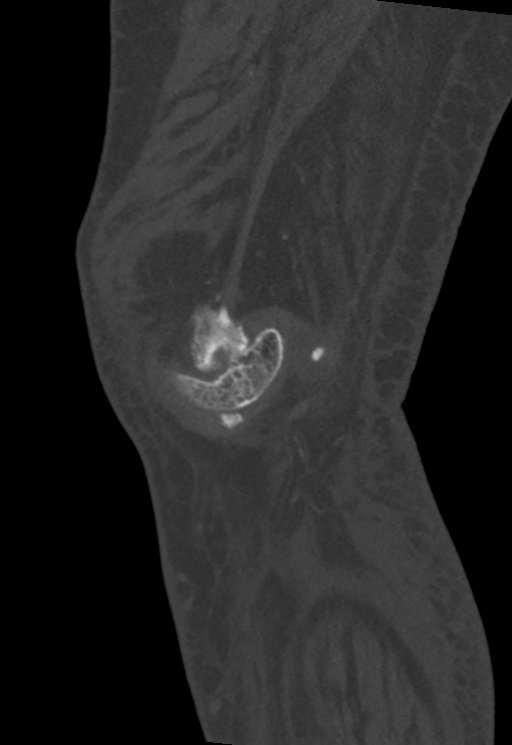
[im 110/219  soft-tissue]
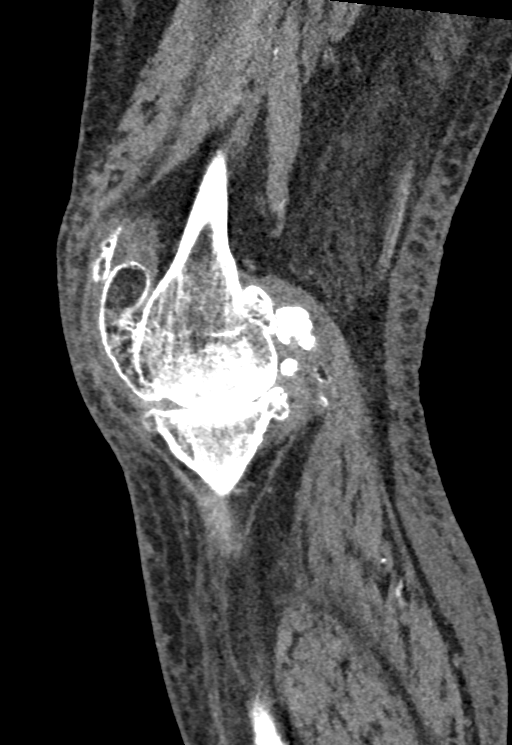
[im 110/219  bone]
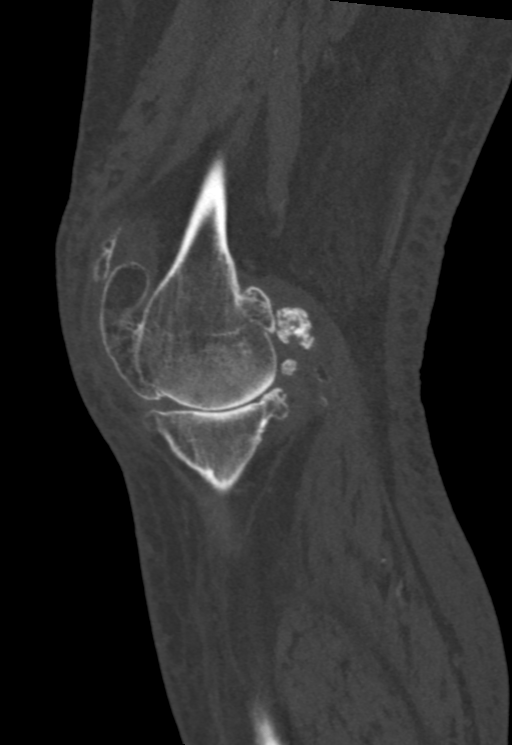
[im 128/219  bone]
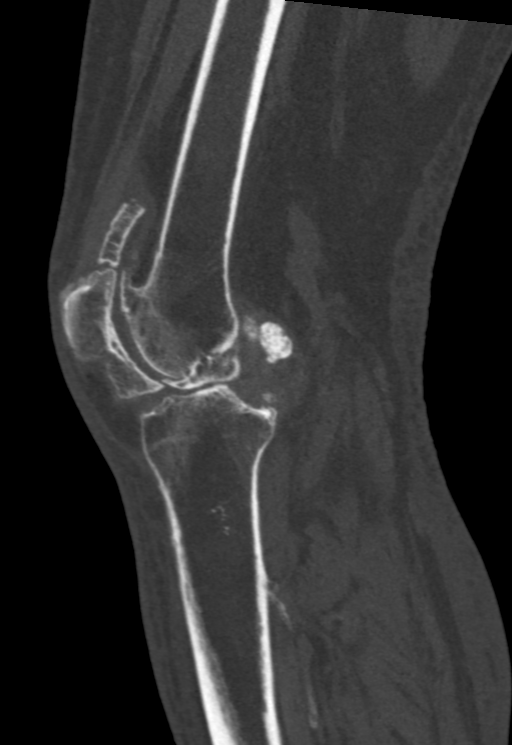
[im 146/219  bone]
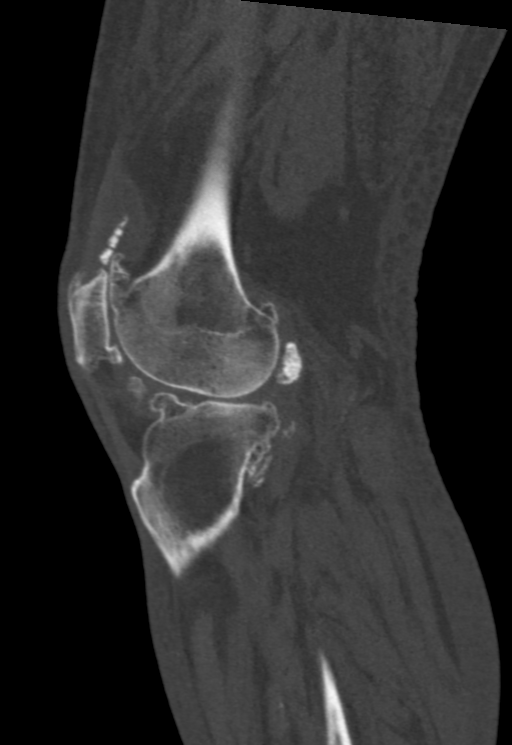

[12 of 35 positions shown; findings below may reference images not displayed]

FINDINGS: Bones/Joint/Cartilage

Schatzker type 1 lateral split fracture of the lateral tibial
plateau observed with the lateral fragment demonstrating a maximum
of 2 mm of step-off depression at the fracture site..

Markedly severe osteoarthritis with full-thickness loss of articular
cartilage, extensive spur fragmentation, and numerous ossific
fragments loose within the joint.

Small to moderate knee effusion. Small Baker's cyst containing
multiple free fragments.

Ligaments

Suboptimally assessed by CT.

Muscles and Tendons

Diffuse regional muscular atrophy although with some differentially
increased atrophy of the semimembranosus muscle.

Soft tissues

Diffuse subcutaneous edema in the calf with primarily anterior and
lateral subcutaneous edema along the knee.
IMPRESSION: 1. Schatzker type 1 acute lateral split fracture of the lateral
tibial plateau with 2 mm of step-off.
2. Markedly severe osteoarthritis with full-thickness loss of
articular cartilage and extensive spur fragmentation.
3. Small to moderate knee effusion with numerous ossific fragments
loose within the joint and within the small Baker's cyst.
4. Regional muscular atrophy most severe in the semimembranosus
muscle.
5. Diffuse calf subcutaneous edema with primarily lateral knee
subcutaneous edema.

## 2018-01-30 MED ORDER — HYDROCODONE-ACETAMINOPHEN 5-325 MG PO TABS
2.0000 | ORAL_TABLET | Freq: Once | ORAL | Status: AC
  Administered 2018-01-30: 2 via ORAL
  Filled 2018-01-30: qty 2

## 2018-01-30 MED ORDER — ENOXAPARIN SODIUM 40 MG/0.4ML ~~LOC~~ SOLN: 40.0000 mg | SUBCUTANEOUS | 0 refills | Status: DC

## 2018-01-30 MED ORDER — ENOXAPARIN SODIUM 40 MG/0.4ML ~~LOC~~ SOLN
40.0000 mg | Freq: Once | SUBCUTANEOUS | Status: AC
  Administered 2018-01-30: 40 mg via SUBCUTANEOUS
  Filled 2018-01-30: qty 0.4

## 2018-01-30 MED ORDER — HYDROCODONE-ACETAMINOPHEN 5-325 MG PO TABS: 1.0000 | ORAL_TABLET | ORAL | 0 refills | Status: DC | PRN

## 2018-01-30 MED FILL — ENOXAPARIN 40 MG/0.4 ML SYR: 40 | 7 days supply | Qty: 3 | Fill #0

## 2018-01-30 MED FILL — HYDROCODON-APAP 5-325: 5-325 | 2 days supply | Qty: 20 | Fill #0

## 2018-01-30 NOTE — Progress Notes (Signed)
Cardiology Office Note:    Date:  01/30/2018   ID:  [name] [name], DOB [birth year], MRN [id number]  PCP:  Greig Right, MD  Cardiologist:  Jenean Lindau, MD   Referring MD: Greig Right, MD    ASSESSMENT:    1. Chronic atrial fibrillation ([name] [name])   2. Essential hypertension   3. Type 2 diabetes mellitus without complication, without long-term current use of insulin ([name])   4. Swelling of joint, knee, right   5. Presence of Watchman left atrial appendage closure device    PLAN:    In order of problems listed above:  1. Secondary prevention stressed with the patient.  Importance of compliance with diet and medications stressed and she vocalized understanding.  Diet was discussed for dyslipidemia and diabetes mellitus and obesity and the risks of obesity explained she vocalized understanding.  She is not a candidate for anticoagulation because of unstable gait.  She was advised to take full-strength coated aspirin on a daily basis. 2. Her right knee appears to be of significant concern to her.  There is tenderness and increased temperature.  It is painful to touch.  I told her that she needs to get urgent distention for this and I will send her downstairs to the emergency room for evaluation which needs to be done in a speedy fashion. 3. Patient will be seen in follow-up appointment in 6 months or earlier if the patient has any concerns    Medication Adjustments/Labs and Tests Ordered: Current medicines are reviewed at length with the patient today.  Concerns regarding medicines are outlined above.  No orders of the defined types were placed in this encounter.  No orders of the defined types were placed in this encounter.    Chief Complaint  Patient presents with  . Follow-up  . Atrial Fibrillation     History of Present Illness:    [name] [name] is a 72 y.o. female.  Patient has chronic atrial fibrillation.  She is a morbidly obese lady with an unstable gait.  The  patient has essential hypertension and chronic atrial fibrillation.  She is not on anticoagulation she has had a watchman device.  The patient is here for follow-up.  She sees my partner for some unclear reason she was put in my schedule.  Her atrial fibrillation does not seem to be much of an issue.  She is here in a wheelchair.  She mentions to me that she fell down and hurt her right knee and since the past couple of days it is hurting her significantly and warm to touch.  Past Medical History:  Diagnosis Date  . Allergic rhinitis   . Anxiety   . ANXIETY 12/26/2007   Qualifier: Diagnosis of  By: Ronnald Ramp CNA/MA, Janett Billow    . Arthritis   . Bronchitis   . CHF (congestive heart failure) ([name])   . CHF, MILD 12/26/2007   Qualifier: Diagnosis of  By: Ronnald Ramp CNA/MA, Janett Billow    . Chronic atrial fibrillation (HCC) 04/24/2016   Watchman atrial appendage device placed at [name] [name] for clot pevention  . Chronic liver disease 03/15/2016  . Complication of anesthesia    low blood pressure once  . Depression   . Enlarged heart   . Essential hypertension 12/26/2007   Qualifier: Diagnosis of  By: Ronnald Ramp CNA/MA, Janett Billow    . Fibromyalgia   . GERD (gastroesophageal reflux disease)   . HTN (hypertension)    on medication since age 34  . Hypercholesteremia   .  Hyperlipidemia 02/29/2016  . Hypothyroidism 03/20/2016  . Idiopathic cirrhosis (HCC)    stage 4; sees [name] in [name]  . Mild CAD 02/29/2016  . Myocardial infarction [name])    age 9  . Neuropathy, peripheral    lower extremities  . OSA (obstructive sleep apnea)   . OSA on CPAP 07/04/2008   CPAP [facility]/ [facility]   . Seasonal and perennial allergic rhinitis 01/23/2011   Allergy vaccine restarted at 1:50 08/02/2011 [place], [place] 2016   . Sleep apnea   . Somnolence   . SOMNOLENCE 12/26/2007   Annotation: excessive daytime Qualifier: Diagnosis of  By: Ronnald Ramp CNA/MA, Janett Billow    . Splenomegaly   . Type II or unspecified type diabetes mellitus without mention of  complication, not stated as uncontrolled   . Urinary incontinence, nocturnal enuresis   . Urinary, incontinence, stress female    wears depends    Past Surgical History:  Procedure Laterality Date  . ABDOMINAL HYSTERECTOMY    . APPENDECTOMY    . BACK SURGERY    . CARPAL TUNNEL RELEASE     bilaterally  . CATARACT EXTRACTION W/ INTRAOCULAR LENS  IMPLANT, BILATERAL    . DILATION AND CURETTAGE OF UTERUS    . EYE SURGERY     cataract ext/ iol implants  . KNEE ARTHROSCOPY    . LUMBAR LAMINECTOMY  03/2011; 01/2012  . MOUTH SURGERY    . SPLENECTOMY    . TONSILLECTOMY AND ADENOIDECTOMY    . TOTAL ABDOMINAL HYSTERECTOMY      Current Medications: Current Meds  Medication Sig  . acarbose (PRECOSE) 100 MG tablet Take 100 mg by mouth 3 (three) times daily with meals.  Marland Kitchen alendronate (FOSAMAX) 70 MG tablet Take 70 mg by mouth once a week.   Marland Kitchen aspirin EC 81 MG tablet Take 1 tablet (81 mg total) by mouth daily.  Marland Kitchen buPROPion (WELLBUTRIN XL) 300 MG 24 hr tablet Take 300 mg by mouth daily.  . carvedilol (COREG) 25 MG tablet Take 25 mg by mouth 2 (two) times daily.  . clindamycin (CLEOCIN) 300 MG capsule TAKE 2 CAPSULES BY MOUTH 1 HOUR BEFORE PROCEDURE.  Marland Kitchen CRESTOR 5 MG tablet Take 1 tablet (5 mg total) by mouth daily.  . diflunisal (DOLOBID) 500 MG TABS Take 1 tablet by mouth 2 (two) times daily.  Marland Kitchen diltiazem (DILACOR XR) 180 MG 24 hr capsule Take 1 capsule (180 mg total) by mouth at bedtime.  . ergocalciferol (VITAMIN D2) 50000 UNITS capsule Take 50,000 Units by mouth once a week. Monday  . gabapentin (NEURONTIN) 100 MG capsule Take 600 mg by mouth 4 (four) times daily.   . irbesartan-hydrochlorothiazide (AVALIDE) 300-12.5 MG tablet Take 1 tablet by mouth daily.  . isosorbide mononitrate (IMDUR) 60 MG 24 hr tablet Take 60 mg by mouth daily.  . JENTADUETO 2.5-500 MG TABS Take 1 tablet by mouth 2 (two) times daily.  Marland Kitchen levothyroxine (SYNTHROID, LEVOTHROID) 125 MCG tablet Take 125 mcg by mouth daily  before breakfast.   . magnesium gluconate (MAGONATE) 500 MG tablet Take 500 mg by mouth once a week.  Marland Kitchen NITROSTAT 0.4 MG SL tablet Place 0.4 mg under the tongue every 5 (five) minutes as needed for chest pain.   Marland Kitchen omeprazole (PRILOSEC) 40 MG capsule Take 1 capsule by mouth 2 (two) times daily.  . potassium chloride SA (K-DUR,KLOR-CON) 20 MEQ tablet Take 40 mEq by mouth 3 (three) times daily.   . traMADol (ULTRAM) 50 MG tablet Take 50 mg  by mouth every 6 (six) hours as needed. For pain  . venlafaxine XR (EFFEXOR-XR) 150 MG 24 hr capsule Take 150 mg by mouth daily with breakfast.      Allergies:   Azithromycin; Cefuroxime axetil; Celecoxib; Codeine; Sulfa antibiotics; and Hydralazine hcl   Social History   Socioeconomic History  . Marital status: Married    Spouse name: None  . Number of children: None  . Years of education: None  . Highest education level: None  Social Needs  . Financial resource strain: None  . Food insecurity - worry: None  . Food insecurity - inability: None  . Transportation needs - medical: None  . Transportation needs - non-medical: None  Occupational History  . None  Tobacco Use  . Smoking status: Never Smoker  . Smokeless tobacco: Never Used  Substance and Sexual Activity  . Alcohol use: No  . Drug use: No  . Sexual activity: None  Other Topics Concern  . None  Social History Narrative  . None     Family History: The patient's family history includes Anesthesia problems in her mother; CAD in her father; Cancer in her mother; Diabetes in her paternal grandmother; Emphysema in her mother; Heart attack in her father; Hypertension in her father; Rheum arthritis in her mother; Stroke in her father.  ROS:   Please see the history of present illness.    All other systems reviewed and are negative.  EKGs/Labs/Other Studies Reviewed:    The following studies were reviewed today: I discussed my evaluation findings today with the patient   Recent  Labs: No results found for requested labs within last 8760 hours.  Recent Lipid Panel No results found for: CHOL, TRIG, HDL, CHOLHDL, VLDL, LDLCALC, LDLDIRECT  Physical Exam:    VS:  BP 130/78 (BP Location: Right Arm, Patient Position: Sitting, Cuff Size: Normal)   Pulse (!) 52   Ht 5\' 3"  (1.6 m)   Wt 231 lb (104.8 kg)   SpO2 97%   BMI 40.92 kg/m     Wt Readings from Last 3 Encounters:  01/30/18 231 lb (104.8 kg)  07/12/17 222 lb 12.8 oz (101.1 kg)  07/05/17 218 lb 6.4 oz (99.1 kg)     GEN: Patient is in no acute distress HEENT: Normal NECK: No JVD; No carotid bruits LYMPHATICS: No lymphadenopathy CARDIAC: Hear sounds regular, 2/6 systolic murmur at the apex. RESPIRATORY:  Clear to auscultation without rales, wheezing or rhonchi  ABDOMEN: Soft, non-tender, non-distended MUSCULOSKELETAL:  No edema; No deformity  SKIN: Warm and dry NEUROLOGIC:  Alert and oriented x 3 PSYCHIATRIC:  Normal affect   Signed, Jenean Lindau, MD  01/30/2018 11:52 AM    [place]

## 2018-01-30 NOTE — ED Triage Notes (Addendum)
Pt tripped and fell last Thursday. Pt c/o R knee pain. Pt has redness noted to R lower leg but states it has been that way for awhile. Pt states she was seen by PCP today and pt states she was told to come here for an xray.

## 2018-01-30 NOTE — ED Provider Notes (Signed)
[name] [name]   CSN: [id number] Arrival date & time: 01/30/18  1158     History   Chief Complaint Chief Complaint  Patient presents with  . Fall    HPI [name] [name] is a 72 y.o. female.  HPI  72 year old female presents with right knee pain.  She states that she fell on it 1 week ago.  At first has been able to walk but now over the last couple days it is too painful to walk.  She chronically has arthritis and knee pain but typically is able to ambulate with assistance of a walker or cane.  She is taking tramadol but this is not helping.  She denies any new injury since the 1 week ago fall. Sent here by PCP for xrays. Right knee is swollen. Primarily pain is inferior.  Past Medical History:  Diagnosis Date  . Allergic rhinitis   . Anxiety   . ANXIETY 12/26/2007   Qualifier: Diagnosis of  By: Ronnald Ramp CNA/MA, Janett Billow    . Arthritis   . Bronchitis   . CHF (congestive heart failure) ([place])   . CHF, MILD 12/26/2007   Qualifier: Diagnosis of  By: Ronnald Ramp CNA/MA, Janett Billow    . Chronic atrial fibrillation (HCC) 04/24/2016   Watchman atrial appendage device placed [facility] for clot pevention  . Chronic liver disease 03/15/2016  . Complication of anesthesia    low blood pressure once  . Depression   . Enlarged heart   . Essential hypertension 12/26/2007   Qualifier: Diagnosis of  By: Ronnald Ramp CNA/MA, Janett Billow    . Fibromyalgia   . GERD (gastroesophageal reflux disease)   . HTN (hypertension)    on medication since age 28  . Hypercholesteremia   . Hyperlipidemia 02/29/2016  . Hypothyroidism 03/20/2016  . Idiopathic cirrhosis (HCC)    stage 4; sees [name] in [place]  . Mild CAD 02/29/2016  . Myocardial infarction [facility]    age 86  . Neuropathy, peripheral    lower extremities  . OSA (obstructive sleep apnea)   . OSA on CPAP 07/04/2008   CPAP [facility]/ [facility]   . Seasonal and perennial allergic rhinitis 01/23/2011   Allergy vaccine restarted at 1:50  08/02/2011 [place], [place] 2016   . Sleep apnea   . Somnolence   . SOMNOLENCE 12/26/2007   Annotation: excessive daytime Qualifier: Diagnosis of  By: Ronnald Ramp CNA/MA, Janett Billow    . Splenomegaly   . Type II or unspecified type diabetes mellitus without mention of complication, not stated as uncontrolled   . Urinary incontinence, nocturnal enuresis   . Urinary, incontinence, stress female    wears depends    Patient Active Problem List   Diagnosis Date Noted  . Swelling of joint, knee, right 01/30/2018  . Presence of Watchman left atrial appendage closure device 01/30/2018  . Esophageal reflux 07/04/2017  . Urinary, incontinence, stress female   . Urinary incontinence, nocturnal enuresis   . Type 2 diabetes mellitus without complications ([place])   . Splenomegaly   . Somnolence   . OSA (obstructive sleep apnea)   . Neuropathy, peripheral   . Myocardial infarction ([place])   . Idiopathic cirrhosis ([place])   . Hypercholesteremia   . Hypertensive heart disease   . GERD (gastroesophageal reflux disease)   . Fibromyalgia   . Enlarged heart   . Complication of anesthesia   . Chronic diastolic heart failure ([place])   . Arthritis   . Anxiety   . Chronic  atrial fibrillation ([place]) 04/24/2016  . Hypothyroidism 03/20/2016  . Chronic liver disease 03/15/2016  . Hyperlipidemia 02/29/2016  . Mild CAD 02/29/2016  . Seasonal and perennial allergic rhinitis 01/23/2011  . Type 2 diabetes mellitus ([place]) 12/29/2007  . ANXIETY 12/26/2007  . Depression 12/26/2007  . Essential hypertension 12/26/2007  . CHF, MILD 12/26/2007  . Bronchitis 12/26/2007  . SOMNOLENCE 12/26/2007    Past Surgical History:  Procedure Laterality Date  . ABDOMINAL HYSTERECTOMY    . APPENDECTOMY    . BACK SURGERY    . CARPAL TUNNEL RELEASE     bilaterally  . CATARACT EXTRACTION W/ INTRAOCULAR LENS  IMPLANT, BILATERAL    . DILATION AND CURETTAGE OF UTERUS    . EYE SURGERY     cataract ext/ iol implants  . KNEE ARTHROSCOPY    .  LUMBAR LAMINECTOMY  03/2011; 01/2012  . MOUTH SURGERY    . SPLENECTOMY    . TONSILLECTOMY AND ADENOIDECTOMY    . TOTAL ABDOMINAL HYSTERECTOMY      OB History    No data available       Home Medications    Prior to Admission medications   Medication Sig Start Date End Date Taking? Authorizing Provider  acarbose (PRECOSE) 100 MG tablet Take 100 mg by mouth 3 (three) times daily with meals.    [provider]  alendronate (FOSAMAX) 70 MG tablet Take 70 mg by mouth once a week.  04/14/14   [provider]  aspirin EC 81 MG tablet Take 1 tablet (81 mg total) by mouth daily. 07/05/17   Richardo Priest, MD  buPROPion (WELLBUTRIN XL) 300 MG 24 hr tablet Take 300 mg by mouth daily.    [provider]  carvedilol (COREG) 25 MG tablet Take 25 mg by mouth 2 (two) times daily. 06/25/17   [provider]  clindamycin (CLEOCIN) 300 MG capsule TAKE 2 CAPSULES BY MOUTH 1 HOUR BEFORE PROCEDURE. 12/26/16   Baird Lyons D, MD  CRESTOR 5 MG tablet Take 1 tablet (5 mg total) by mouth daily. 09/04/17   Richardo Priest, MD  diflunisal (DOLOBID) 500 MG TABS Take 1 tablet by mouth 2 (two) times daily. 01/05/13   [provider]  diltiazem (DILACOR XR) 180 MG 24 hr capsule Take 1 capsule (180 mg total) by mouth at bedtime. 08/21/17   Richardo Priest, MD  doxazosin (CARDURA) 4 MG tablet Take 4 mg by mouth at bedtime. 09/12/16 09/12/17  [provider]  enoxaparin (LOVENOX) 40 MG/0.4ML injection Inject 0.4 mLs (40 mg total) into the skin daily. 01/31/18   Sherwood Gambler, MD  ergocalciferol (VITAMIN D2) 50000 UNITS capsule Take 50,000 Units by mouth once a week. Monday    [provider]  gabapentin (NEURONTIN) 100 MG capsule Take 600 mg by mouth 4 (four) times daily.     [provider]  HYDROcodone-acetaminophen (NORCO) 5-325 MG tablet Take 1-2 tablets by mouth every 4 (four) hours as needed for severe pain. 01/30/18   Sherwood Gambler, MD    irbesartan-hydrochlorothiazide (AVALIDE) 300-12.5 MG tablet Take 1 tablet by mouth daily. 06/27/17   [provider]  isosorbide mononitrate (IMDUR) 60 MG 24 hr tablet Take 60 mg by mouth daily.    [provider]  JENTADUETO 2.5-500 MG TABS Take 1 tablet by mouth 2 (two) times daily. 12/31/13   [provider]  levothyroxine (SYNTHROID, LEVOTHROID) 125 MCG tablet Take 125 mcg by mouth daily before breakfast.  12/06/14  [provider]  magnesium gluconate (MAGONATE) 500 MG tablet Take 500 mg by mouth once a week.    [provider]  NITROSTAT 0.4 MG SL tablet Place 0.4 mg under the tongue every 5 (five) minutes as needed for chest pain.  12/16/14   [provider]  omeprazole (PRILOSEC) 40 MG capsule Take 1 capsule by mouth 2 (two) times daily. 01/12/13   [provider]  potassium chloride SA (K-DUR,KLOR-CON) 20 MEQ tablet Take 40 mEq by mouth 3 (three) times daily.     [provider]  traMADol (ULTRAM) 50 MG tablet Take 50 mg by mouth every 6 (six) hours as needed. For pain    [provider]  venlafaxine XR (EFFEXOR-XR) 150 MG 24 hr capsule Take 150 mg by mouth daily with breakfast.  12/20/14   [provider]    Family History Family History  Problem Relation Age of Onset  . Emphysema Mother   . Rheum arthritis Mother   . Cancer Mother        uterine, cervical, vaginal  . Anesthesia problems Mother   . Heart attack Father   . Stroke Father   . CAD Father   . Hypertension Father   . Diabetes Paternal Grandmother     Social History Social History   Tobacco Use  . Smoking status: Never Smoker  . Smokeless tobacco: Never Used  Substance Use Topics  . Alcohol use: No  . Drug use: No     Allergies   Azithromycin; Cefuroxime axetil; Celecoxib; Codeine; Sulfa antibiotics; and Hydralazine hcl   Review of Systems Review of Systems  Musculoskeletal: Positive for arthralgias and joint swelling.   All other systems reviewed and are negative.    Physical Exam Updated Vital Signs BP (!) 150/72 (BP Location: Right Arm)   Pulse (!) 56   Temp 98.8 F (37.1 C) (Oral)   Resp 18   SpO2 94%   Physical Exam  Constitutional: She is oriented to person, place, and time. She appears well-developed and well-nourished.  obese  HENT:  Head: Normocephalic and atraumatic.  Right Ear: External ear normal.  Left Ear: External ear normal.  Nose: Nose normal.  Eyes: Right eye exhibits no discharge. Left eye exhibits no discharge.  Cardiovascular: Normal rate and regular rhythm.  Pulses:      Dorsalis pedis pulses are 2+ on the right side.  Pulmonary/Chest: Effort normal.  Musculoskeletal:       Right knee: She exhibits swelling and ecchymosis. She exhibits normal range of motion and no deformity. Tenderness found. Medial joint line and lateral joint line tenderness noted.       Legs: Neurological: She is alert and oriented to person, place, and time.  Skin: Skin is warm and dry.  Nursing [name] and vitals reviewed.    ED Treatments / Results  Labs (all labs ordered are listed, but only abnormal results are displayed) Labs Reviewed - No data to display  EKG  EKG Interpretation None       Radiology Ct Knee Right Wo Contrast  Result Date: 01/30/2018 CLINICAL DATA:  Fall 1 week ago with right knee pain and swelling. Difficulty weight-bearing. Severe degenerative arthropathy on radiography. EXAM: CT OF THE right KNEE WITHOUT CONTRAST TECHNIQUE: Multidetector CT imaging of the right knee was performed according to the standard protocol. Multiplanar CT image reconstructions were also generated. COMPARISON:  01/30/2018 radiographs FINDINGS: Bones/Joint/Cartilage Schatzker type 1 lateral split fracture of the lateral tibial plateau observed with the  lateral fragment demonstrating a maximum of 2 mm of step-off depression at the fracture site. Markedly severe osteoarthritis with full-thickness  loss of articular cartilage, extensive spur fragmentation, and numerous ossific fragments loose within the joint. Small to moderate knee effusion. Small Baker's cyst containing multiple free fragments. Ligaments Suboptimally assessed by CT. Muscles and Tendons Diffuse regional muscular atrophy although with some differentially increased atrophy of the semimembranosus muscle. Soft tissues Diffuse subcutaneous edema in the calf with primarily anterior and lateral subcutaneous edema along the knee. IMPRESSION: 1. Schatzker type 1 acute lateral split fracture of the lateral tibial plateau with 2 mm of step-off. 2. Markedly severe osteoarthritis with full-thickness loss of articular cartilage and extensive spur fragmentation. 3. Small to moderate knee effusion with numerous ossific fragments loose within the joint and within the small Baker's cyst. 4. Regional muscular atrophy most severe in the semimembranosus muscle. 5. Diffuse calf subcutaneous edema with primarily lateral knee subcutaneous edema. Electronically Signed   By: Van Clines M.D.   On: 01/30/2018 14:25   Dg Knee Complete 4 Views Right  Result Date: 01/30/2018 CLINICAL DATA:  Pain and difficulty standing after fall. EXAM: RIGHT KNEE - COMPLETE 4+ VIEW COMPARISON:  Back no recent prior. FINDINGS: Severe diffuse tricompartment degenerative change with large loose bodies and/or synovial osteochondromatosis noted. No evidence of acute fracture or dislocation. Small knee joint effusion present. IMPRESSION: 1. Severe diffuse tricompartment degenerative change with prominent loose bodies and/or synovial osteochondromatosis. 2. Small knee joint effusion. No evidence of acute fracture or dislocation. Electronically Signed   By: Marcello Moores  Register   On: 01/30/2018 12:42    Procedures Procedures (including critical care time)  Medications Ordered in ED Medications  enoxaparin (LOVENOX) injection 40 mg (not administered)  HYDROcodone-acetaminophen  (NORCO/VICODIN) 5-325 MG per tablet 2 tablet (2 tablets Oral Given 01/30/18 1416)     Initial Impression / Assessment and Plan / ED Course  I have reviewed the triage vital signs and the nursing notes.  Pertinent labs & imaging results that were available during my care of the patient were reviewed by me and considered in my medical decision making (see chart for details).     Patient's workup is consistent with right tibial plateau fracture.  X-ray likeliness this due to the severe osteoarthritis and so a CT was obtained given inability to bear weight well.  I discussed her case with Dr. Alma Friendly of orthopedics.  He recommends follow-up with 1 of his partners such as Dr. Ihor Gully in about a week.  He also recommends patient to be on Lovenox to help prevent DVT given new immobility.  She will be given Norco and told not to use her tramadol for pain control.  She will be placed in a splint.  She states she has her husband that she lives with and is able to care for her.  She declines need for admission or for assisted living/nursing home placement.  Pain is better with Norco.  Neurovascular intact.  She has a wheelchair that she has at home that she can use to get around.  She is told that she is nonweightbearing.  Discussed return precautions.  Otherwise her compartments are soft and she does not appear to have any acute abnormalities besides the fracture.  Final Clinical Impressions(s) / ED Diagnoses   Final diagnoses:  Closed fracture of right tibial plateau, initial encounter    ED Discharge Orders        Ordered    enoxaparin (LOVENOX) 40 MG/0.4ML injection  Every 24 hours     01/30/18 Catharine     01/30/18 1454    Face-to-face encounter (required for Medicare/Medicaid patients)    Comments:  I Ephraim Hamburger certify that this patient is under my care and that I, or a nurse practitioner or physician's assistant working with me, had a face-to-face encounter that meets the physician  face-to-face encounter requirements with this patient on 01/30/2018. The encounter with the patient was in whole, or in part for the following medical condition(s) which is the primary reason for home health care (List medical condition): Right Tibial Plateau fracture   01/30/18 1454    HYDROcodone-acetaminophen (NORCO) 5-325 MG tablet  Every 4 hours PRN     01/30/18 1535       Sherwood Gambler, MD 01/30/18 1553

## 2018-01-30 NOTE — ED Notes (Signed)
Pt verbalizes understanding of d/c instructions and denies any further needs at this time. 

## 2018-02-11 DIAGNOSIS — M1711 Unilateral primary osteoarthritis, right knee: Secondary | ICD-10-CM | POA: Diagnosis not present

## 2018-02-11 DIAGNOSIS — S82191A Other fracture of upper end of right tibia, initial encounter for closed fracture: Secondary | ICD-10-CM | POA: Diagnosis not present

## 2018-03-11 DIAGNOSIS — M25561 Pain in right knee: Secondary | ICD-10-CM | POA: Diagnosis not present

## 2018-03-11 DIAGNOSIS — S82124D Nondisplaced fracture of lateral condyle of right tibia, subsequent encounter for closed fracture with routine healing: Secondary | ICD-10-CM | POA: Diagnosis not present

## 2018-03-13 DIAGNOSIS — S82109A Unspecified fracture of upper end of unspecified tibia, initial encounter for closed fracture: Secondary | ICD-10-CM | POA: Insufficient documentation

## 2018-03-13 HISTORY — DX: Unspecified fracture of upper end of unspecified tibia, initial encounter for closed fracture: S82.109A

## 2018-03-24 DIAGNOSIS — F3341 Major depressive disorder, recurrent, in partial remission: Secondary | ICD-10-CM | POA: Diagnosis not present

## 2018-04-01 DIAGNOSIS — Z6841 Body Mass Index (BMI) 40.0 and over, adult: Secondary | ICD-10-CM | POA: Diagnosis not present

## 2018-04-01 DIAGNOSIS — N390 Urinary tract infection, site not specified: Secondary | ICD-10-CM | POA: Diagnosis not present

## 2018-04-01 DIAGNOSIS — E7800 Pure hypercholesterolemia, unspecified: Secondary | ICD-10-CM | POA: Diagnosis not present

## 2018-04-01 DIAGNOSIS — Z9181 History of falling: Secondary | ICD-10-CM | POA: Diagnosis not present

## 2018-04-01 DIAGNOSIS — E038 Other specified hypothyroidism: Secondary | ICD-10-CM | POA: Diagnosis not present

## 2018-04-01 DIAGNOSIS — I4891 Unspecified atrial fibrillation: Secondary | ICD-10-CM | POA: Diagnosis not present

## 2018-04-01 DIAGNOSIS — I10 Essential (primary) hypertension: Secondary | ICD-10-CM | POA: Diagnosis not present

## 2018-04-01 DIAGNOSIS — M6281 Muscle weakness (generalized): Secondary | ICD-10-CM | POA: Diagnosis not present

## 2018-04-01 DIAGNOSIS — E113293 Type 2 diabetes mellitus with mild nonproliferative diabetic retinopathy without macular edema, bilateral: Secondary | ICD-10-CM | POA: Diagnosis not present

## 2018-04-01 DIAGNOSIS — E1140 Type 2 diabetes mellitus with diabetic neuropathy, unspecified: Secondary | ICD-10-CM | POA: Diagnosis not present

## 2018-04-03 DIAGNOSIS — E113293 Type 2 diabetes mellitus with mild nonproliferative diabetic retinopathy without macular edema, bilateral: Secondary | ICD-10-CM | POA: Diagnosis not present

## 2018-04-03 DIAGNOSIS — H524 Presbyopia: Secondary | ICD-10-CM | POA: Diagnosis not present

## 2018-05-28 DIAGNOSIS — K7460 Unspecified cirrhosis of liver: Secondary | ICD-10-CM | POA: Diagnosis not present

## 2018-05-30 DIAGNOSIS — I2510 Atherosclerotic heart disease of native coronary artery without angina pectoris: Secondary | ICD-10-CM | POA: Diagnosis not present

## 2018-05-30 DIAGNOSIS — M2550 Pain in unspecified joint: Secondary | ICD-10-CM | POA: Diagnosis not present

## 2018-05-30 DIAGNOSIS — Z6837 Body mass index (BMI) 37.0-37.9, adult: Secondary | ICD-10-CM | POA: Diagnosis not present

## 2018-05-30 DIAGNOSIS — E1142 Type 2 diabetes mellitus with diabetic polyneuropathy: Secondary | ICD-10-CM | POA: Diagnosis not present

## 2018-06-04 DIAGNOSIS — K7460 Unspecified cirrhosis of liver: Secondary | ICD-10-CM | POA: Diagnosis not present

## 2018-06-05 DIAGNOSIS — K7460 Unspecified cirrhosis of liver: Secondary | ICD-10-CM | POA: Diagnosis not present

## 2018-06-05 DIAGNOSIS — R195 Other fecal abnormalities: Secondary | ICD-10-CM | POA: Diagnosis not present

## 2018-06-05 DIAGNOSIS — K580 Irritable bowel syndrome with diarrhea: Secondary | ICD-10-CM | POA: Diagnosis not present

## 2018-06-05 DIAGNOSIS — D649 Anemia, unspecified: Secondary | ICD-10-CM | POA: Diagnosis not present

## 2018-06-05 DIAGNOSIS — Z1212 Encounter for screening for malignant neoplasm of rectum: Secondary | ICD-10-CM | POA: Diagnosis not present

## 2018-06-05 DIAGNOSIS — K219 Gastro-esophageal reflux disease without esophagitis: Secondary | ICD-10-CM | POA: Diagnosis not present

## 2018-06-11 DIAGNOSIS — R928 Other abnormal and inconclusive findings on diagnostic imaging of breast: Secondary | ICD-10-CM | POA: Diagnosis not present

## 2018-06-11 DIAGNOSIS — R921 Mammographic calcification found on diagnostic imaging of breast: Secondary | ICD-10-CM | POA: Diagnosis not present

## 2018-06-23 ENCOUNTER — Encounter: Payer: Self-pay | Admitting: Internal Medicine

## 2018-06-23 ENCOUNTER — Telehealth: Payer: Self-pay

## 2018-06-23 MED ORDER — DOXAZOSIN MESYLATE 4 MG PO TABS: 4.0000 mg | ORAL_TABLET | Freq: Every day | ORAL | 3 refills | Status: DC

## 2018-06-23 NOTE — Telephone Encounter (Signed)
Rx sent to pharmacy as requested.

## 2018-07-02 DIAGNOSIS — N3000 Acute cystitis without hematuria: Secondary | ICD-10-CM | POA: Diagnosis not present

## 2018-07-07 DIAGNOSIS — R197 Diarrhea, unspecified: Secondary | ICD-10-CM | POA: Diagnosis not present

## 2018-07-07 DIAGNOSIS — R112 Nausea with vomiting, unspecified: Secondary | ICD-10-CM | POA: Diagnosis not present

## 2018-07-07 DIAGNOSIS — R509 Fever, unspecified: Secondary | ICD-10-CM | POA: Diagnosis not present

## 2018-07-07 DIAGNOSIS — J069 Acute upper respiratory infection, unspecified: Secondary | ICD-10-CM | POA: Diagnosis not present

## 2018-07-08 DIAGNOSIS — G9341 Metabolic encephalopathy: Secondary | ICD-10-CM | POA: Diagnosis present

## 2018-07-08 DIAGNOSIS — F0390 Unspecified dementia without behavioral disturbance: Secondary | ICD-10-CM | POA: Diagnosis present

## 2018-07-08 DIAGNOSIS — I482 Chronic atrial fibrillation: Secondary | ICD-10-CM | POA: Diagnosis present

## 2018-07-08 DIAGNOSIS — K7581 Nonalcoholic steatohepatitis (NASH): Secondary | ICD-10-CM | POA: Diagnosis present

## 2018-07-08 DIAGNOSIS — R319 Hematuria, unspecified: Secondary | ICD-10-CM | POA: Diagnosis not present

## 2018-07-08 DIAGNOSIS — R4182 Altered mental status, unspecified: Secondary | ICD-10-CM | POA: Diagnosis not present

## 2018-07-08 DIAGNOSIS — Z8659 Personal history of other mental and behavioral disorders: Secondary | ICD-10-CM | POA: Diagnosis not present

## 2018-07-08 DIAGNOSIS — Z79899 Other long term (current) drug therapy: Secondary | ICD-10-CM | POA: Diagnosis not present

## 2018-07-08 DIAGNOSIS — B961 Klebsiella pneumoniae [K. pneumoniae] as the cause of diseases classified elsewhere: Secondary | ICD-10-CM | POA: Diagnosis not present

## 2018-07-08 DIAGNOSIS — R110 Nausea: Secondary | ICD-10-CM | POA: Diagnosis not present

## 2018-07-08 DIAGNOSIS — Z743 Need for continuous supervision: Secondary | ICD-10-CM | POA: Diagnosis not present

## 2018-07-08 DIAGNOSIS — M1990 Unspecified osteoarthritis, unspecified site: Secondary | ICD-10-CM | POA: Diagnosis not present

## 2018-07-08 DIAGNOSIS — R531 Weakness: Secondary | ICD-10-CM | POA: Diagnosis not present

## 2018-07-08 DIAGNOSIS — R197 Diarrhea, unspecified: Secondary | ICD-10-CM | POA: Diagnosis present

## 2018-07-08 DIAGNOSIS — I2510 Atherosclerotic heart disease of native coronary artery without angina pectoris: Secondary | ICD-10-CM | POA: Diagnosis present

## 2018-07-08 DIAGNOSIS — F329 Major depressive disorder, single episode, unspecified: Secondary | ICD-10-CM | POA: Diagnosis not present

## 2018-07-08 DIAGNOSIS — R109 Unspecified abdominal pain: Secondary | ICD-10-CM | POA: Diagnosis not present

## 2018-07-08 DIAGNOSIS — E119 Type 2 diabetes mellitus without complications: Secondary | ICD-10-CM | POA: Diagnosis not present

## 2018-07-08 DIAGNOSIS — K7460 Unspecified cirrhosis of liver: Secondary | ICD-10-CM | POA: Diagnosis not present

## 2018-07-08 DIAGNOSIS — B9689 Other specified bacterial agents as the cause of diseases classified elsewhere: Secondary | ICD-10-CM | POA: Diagnosis not present

## 2018-07-08 DIAGNOSIS — E876 Hypokalemia: Secondary | ICD-10-CM | POA: Diagnosis present

## 2018-07-08 DIAGNOSIS — K7469 Other cirrhosis of liver: Secondary | ICD-10-CM | POA: Diagnosis present

## 2018-07-08 DIAGNOSIS — A419 Sepsis, unspecified organism: Secondary | ICD-10-CM | POA: Diagnosis present

## 2018-07-08 DIAGNOSIS — E1121 Type 2 diabetes mellitus with diabetic nephropathy: Secondary | ICD-10-CM | POA: Diagnosis not present

## 2018-07-08 DIAGNOSIS — I10 Essential (primary) hypertension: Secondary | ICD-10-CM | POA: Diagnosis present

## 2018-07-08 DIAGNOSIS — R5381 Other malaise: Secondary | ICD-10-CM | POA: Diagnosis not present

## 2018-07-08 DIAGNOSIS — I4891 Unspecified atrial fibrillation: Secondary | ICD-10-CM | POA: Diagnosis not present

## 2018-07-08 DIAGNOSIS — N390 Urinary tract infection, site not specified: Secondary | ICD-10-CM | POA: Diagnosis present

## 2018-07-08 DIAGNOSIS — K219 Gastro-esophageal reflux disease without esophagitis: Secondary | ICD-10-CM | POA: Diagnosis present

## 2018-07-08 DIAGNOSIS — E1142 Type 2 diabetes mellitus with diabetic polyneuropathy: Secondary | ICD-10-CM | POA: Diagnosis present

## 2018-07-11 DIAGNOSIS — I10 Essential (primary) hypertension: Secondary | ICD-10-CM | POA: Diagnosis not present

## 2018-07-11 DIAGNOSIS — K7581 Nonalcoholic steatohepatitis (NASH): Secondary | ICD-10-CM | POA: Diagnosis not present

## 2018-07-11 DIAGNOSIS — R5381 Other malaise: Secondary | ICD-10-CM | POA: Diagnosis not present

## 2018-07-11 DIAGNOSIS — R109 Unspecified abdominal pain: Secondary | ICD-10-CM | POA: Diagnosis not present

## 2018-07-11 DIAGNOSIS — B961 Klebsiella pneumoniae [K. pneumoniae] as the cause of diseases classified elsewhere: Secondary | ICD-10-CM | POA: Diagnosis not present

## 2018-07-11 DIAGNOSIS — F329 Major depressive disorder, single episode, unspecified: Secondary | ICD-10-CM | POA: Diagnosis not present

## 2018-07-11 DIAGNOSIS — G9341 Metabolic encephalopathy: Secondary | ICD-10-CM | POA: Diagnosis not present

## 2018-07-11 DIAGNOSIS — N390 Urinary tract infection, site not specified: Secondary | ICD-10-CM | POA: Diagnosis not present

## 2018-07-11 DIAGNOSIS — I119 Hypertensive heart disease without heart failure: Secondary | ICD-10-CM | POA: Diagnosis not present

## 2018-07-11 DIAGNOSIS — I482 Chronic atrial fibrillation: Secondary | ICD-10-CM | POA: Diagnosis not present

## 2018-07-11 DIAGNOSIS — E1121 Type 2 diabetes mellitus with diabetic nephropathy: Secondary | ICD-10-CM | POA: Diagnosis not present

## 2018-07-11 DIAGNOSIS — R197 Diarrhea, unspecified: Secondary | ICD-10-CM | POA: Diagnosis not present

## 2018-07-11 DIAGNOSIS — Z743 Need for continuous supervision: Secondary | ICD-10-CM | POA: Diagnosis not present

## 2018-07-11 DIAGNOSIS — R262 Difficulty in walking, not elsewhere classified: Secondary | ICD-10-CM | POA: Diagnosis not present

## 2018-07-11 DIAGNOSIS — I2510 Atherosclerotic heart disease of native coronary artery without angina pectoris: Secondary | ICD-10-CM | POA: Diagnosis not present

## 2018-07-11 DIAGNOSIS — E119 Type 2 diabetes mellitus without complications: Secondary | ICD-10-CM | POA: Diagnosis not present

## 2018-07-11 DIAGNOSIS — E1152 Type 2 diabetes mellitus with diabetic peripheral angiopathy with gangrene: Secondary | ICD-10-CM | POA: Diagnosis not present

## 2018-07-11 DIAGNOSIS — M1990 Unspecified osteoarthritis, unspecified site: Secondary | ICD-10-CM | POA: Diagnosis not present

## 2018-07-11 DIAGNOSIS — E876 Hypokalemia: Secondary | ICD-10-CM | POA: Diagnosis not present

## 2018-07-11 DIAGNOSIS — A419 Sepsis, unspecified organism: Secondary | ICD-10-CM | POA: Diagnosis not present

## 2018-07-11 DIAGNOSIS — R110 Nausea: Secondary | ICD-10-CM | POA: Diagnosis not present

## 2018-07-14 ENCOUNTER — Ambulatory Visit: Payer: BLUE CROSS/BLUE SHIELD | Admitting: Internal Medicine

## 2018-07-14 DIAGNOSIS — R262 Difficulty in walking, not elsewhere classified: Secondary | ICD-10-CM | POA: Diagnosis not present

## 2018-07-14 DIAGNOSIS — E1152 Type 2 diabetes mellitus with diabetic peripheral angiopathy with gangrene: Secondary | ICD-10-CM | POA: Diagnosis not present

## 2018-07-14 DIAGNOSIS — N390 Urinary tract infection, site not specified: Secondary | ICD-10-CM | POA: Diagnosis not present

## 2018-07-14 DIAGNOSIS — I119 Hypertensive heart disease without heart failure: Secondary | ICD-10-CM | POA: Diagnosis not present

## 2018-07-31 DIAGNOSIS — Z7982 Long term (current) use of aspirin: Secondary | ICD-10-CM | POA: Diagnosis not present

## 2018-07-31 DIAGNOSIS — I4891 Unspecified atrial fibrillation: Secondary | ICD-10-CM | POA: Diagnosis not present

## 2018-07-31 DIAGNOSIS — M810 Age-related osteoporosis without current pathological fracture: Secondary | ICD-10-CM | POA: Diagnosis not present

## 2018-07-31 DIAGNOSIS — E1140 Type 2 diabetes mellitus with diabetic neuropathy, unspecified: Secondary | ICD-10-CM | POA: Diagnosis not present

## 2018-07-31 DIAGNOSIS — F329 Major depressive disorder, single episode, unspecified: Secondary | ICD-10-CM | POA: Diagnosis not present

## 2018-07-31 DIAGNOSIS — K7581 Nonalcoholic steatohepatitis (NASH): Secondary | ICD-10-CM | POA: Diagnosis not present

## 2018-07-31 DIAGNOSIS — Z7984 Long term (current) use of oral hypoglycemic drugs: Secondary | ICD-10-CM | POA: Diagnosis not present

## 2018-07-31 DIAGNOSIS — K7460 Unspecified cirrhosis of liver: Secondary | ICD-10-CM | POA: Diagnosis not present

## 2018-07-31 DIAGNOSIS — D6959 Other secondary thrombocytopenia: Secondary | ICD-10-CM | POA: Diagnosis not present

## 2018-07-31 DIAGNOSIS — I2510 Atherosclerotic heart disease of native coronary artery without angina pectoris: Secondary | ICD-10-CM | POA: Diagnosis not present

## 2018-07-31 DIAGNOSIS — Z87440 Personal history of urinary (tract) infections: Secondary | ICD-10-CM | POA: Diagnosis not present

## 2018-07-31 DIAGNOSIS — F419 Anxiety disorder, unspecified: Secondary | ICD-10-CM | POA: Diagnosis not present

## 2018-07-31 DIAGNOSIS — K219 Gastro-esophageal reflux disease without esophagitis: Secondary | ICD-10-CM | POA: Diagnosis not present

## 2018-07-31 DIAGNOSIS — I10 Essential (primary) hypertension: Secondary | ICD-10-CM | POA: Diagnosis not present

## 2018-08-04 DIAGNOSIS — E1140 Type 2 diabetes mellitus with diabetic neuropathy, unspecified: Secondary | ICD-10-CM | POA: Diagnosis not present

## 2018-08-04 DIAGNOSIS — K7460 Unspecified cirrhosis of liver: Secondary | ICD-10-CM | POA: Diagnosis not present

## 2018-08-04 DIAGNOSIS — Z87440 Personal history of urinary (tract) infections: Secondary | ICD-10-CM | POA: Diagnosis not present

## 2018-08-04 DIAGNOSIS — I2510 Atherosclerotic heart disease of native coronary artery without angina pectoris: Secondary | ICD-10-CM | POA: Diagnosis not present

## 2018-08-04 DIAGNOSIS — K7581 Nonalcoholic steatohepatitis (NASH): Secondary | ICD-10-CM | POA: Diagnosis not present

## 2018-08-04 DIAGNOSIS — D6959 Other secondary thrombocytopenia: Secondary | ICD-10-CM | POA: Diagnosis not present

## 2018-08-05 ENCOUNTER — Ambulatory Visit (INDEPENDENT_AMBULATORY_CARE_PROVIDER_SITE_OTHER): Payer: Medicare Other | Admitting: Cardiology

## 2018-08-05 ENCOUNTER — Ambulatory Visit: Payer: BLUE CROSS/BLUE SHIELD | Admitting: Cardiology

## 2018-08-05 VITALS — BP 128/64 | HR 67 | Ht 63.0 in | Wt 215.4 lb

## 2018-08-05 DIAGNOSIS — Q6432 Congenital stricture of urethra: Secondary | ICD-10-CM | POA: Diagnosis not present

## 2018-08-05 DIAGNOSIS — E119 Type 2 diabetes mellitus without complications: Secondary | ICD-10-CM

## 2018-08-05 DIAGNOSIS — K7460 Unspecified cirrhosis of liver: Secondary | ICD-10-CM | POA: Diagnosis not present

## 2018-08-05 DIAGNOSIS — I5032 Chronic diastolic (congestive) heart failure: Secondary | ICD-10-CM

## 2018-08-05 DIAGNOSIS — I110 Hypertensive heart disease with heart failure: Secondary | ICD-10-CM | POA: Diagnosis not present

## 2018-08-05 DIAGNOSIS — I482 Chronic atrial fibrillation, unspecified: Secondary | ICD-10-CM

## 2018-08-05 DIAGNOSIS — Z95818 Presence of other cardiac implants and grafts: Secondary | ICD-10-CM | POA: Diagnosis not present

## 2018-08-05 DIAGNOSIS — I2510 Atherosclerotic heart disease of native coronary artery without angina pectoris: Secondary | ICD-10-CM

## 2018-08-05 MED ORDER — DILTIAZEM HCL ER 180 MG PO CP24: 180.0000 mg | ORAL_CAPSULE | Freq: Every day | ORAL | 11 refills | Status: DC

## 2018-08-05 NOTE — Progress Notes (Signed)
Cardiology Office Note:    Date:  08/05/2018   ID:  [name] [name], DOB November 26, 1945, MRN [id number]  PCP:  Greig Right, MD  Cardiologist:  Shirlee More, MD    Referring MD: Greig Right, MD    ASSESSMENT:    1. Chronic atrial fibrillation ([place])   2. Presence of Watchman left atrial appendage closure device   3. Chronic diastolic heart failure ([place])   4. Hypertensive heart disease with chronic diastolic congestive heart failure ([place])   5. Mild CAD   6. Idiopathic cirrhosis ([place])   7. Type 2 diabetes mellitus without complication, without long-term current use of insulin (HCC)    PLAN:    In order of problems listed above:  1. Stable I asked her to not resume her rate limiting calcium channel blocker the rate is controlled with a beta-blocker continue low-dose aspirin 2. Stable continue low-dose aspirin 3. Stable continue her current diuretic proved off amlodipine 4. For blood pressure target continue current treatment including ARB thiazide diuretic 5. He will continue medical treatment aspirin oral nitrate and beta-blocker on with statin. 6. Will managed by GI 7. Stable managed by her PCP   Next appointment: 6 months   Medication Adjustments/Labs and Tests Ordered: Current medicines are reviewed at length with the patient today.  Concerns regarding medicines are outlined above.  No orders of the defined types were placed in this encounter.  Meds ordered this encounter  Medications  . diltiazem (DILACOR XR) 180 MG 24 hr capsule    Sig: Take 1 capsule (180 mg total) by mouth at bedtime.    Dispense:  30 capsule    Refill:  11    No chief complaint on file.   History of Present Illness:    [name] [name] is a 72 y.o. female with a hx of chronic AF with watchman LAA device 2017, cirrhosis, hypertension and heart failure last seen 01/30/18 by Dr Dereck Leep. She was admitted to [facility] with sepsis. Compliance with diet, lifestyle and medications: Yes  I am requesting  records she tells me she was at [facility] recently with recurrent urinary tract infection and what they called sepsis.  So debilitated and weak and she went to collapse rehab and is returned home.  While there they stopped the diltiazem placed her on amlodipine and developed severe peripheral edema she has had in the past and was discontinued.  Only 1 of her diagnoses as well as atherosclerosis likely from a CT scan she had no chest pain or acute cardiac event.  Since she stopped amlodipine her peripheral edema has improved.  No chest pain shortness of breath palpitations syncope or TIA she has had a watchman left atrial occlusion device and is no longer anti-coagulated.  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  At her request I sent her to Dr. Gerlene Burdock urology-per Past Medical History:  Diagnosis Date  . Allergic rhinitis   . Anxiety   . ANXIETY 12/26/2007   Qualifier: Diagnosis of  By: Ronnald Ramp CNA/MA, Janett Billow    . Arthritis   . Bronchitis   . CHF (congestive heart failure) ([place])   . CHF, MILD 12/26/2007   Qualifier: Diagnosis of  By: Ronnald Ramp CNA/MA, Janett Billow    . Chronic  atrial fibrillation ([place]) 04/24/2016   Watchman atrial appendage device placed at [facility] for clot pevention  . Chronic liver disease 03/15/2016  . Complication of anesthesia    low blood pressure once  . Depression   . Enlarged heart   . Essential hypertension 12/26/2007   Qualifier: Diagnosis of  By: Ronnald Ramp CNA/MA, Janett Billow    . Fibromyalgia   . GERD (gastroesophageal reflux disease)   . HTN (hypertension)    on medication since age 35  . Hypercholesteremia   . Hyperlipidemia 02/29/2016  . Hypothyroidism 03/20/2016  . Idiopathic cirrhosis (HCC)    stage 4; sees [name] in [place]  . Mild CAD  02/29/2016  . Myocardial infarction [facility])    age 64  . Neuropathy, peripheral    lower extremities  . OSA (obstructive sleep apnea)   . OSA on CPAP 07/04/2008   CPAP [facility]/ [facility]   . Seasonal and perennial allergic rhinitis 01/23/2011   Allergy vaccine restarted at 1:50 08/02/2011 [place], [place] 2016   . Sleep apnea   . Somnolence   . SOMNOLENCE 12/26/2007   Annotation: excessive daytime Qualifier: Diagnosis of  By: Ronnald Ramp CNA/MA, Janett Billow    . Splenomegaly   . Type II or unspecified type diabetes mellitus without mention of complication, not stated as uncontrolled   . Urinary incontinence, nocturnal enuresis   . Urinary, incontinence, stress female    wears depends    Past Surgical History:  Procedure Laterality Date  . ABDOMINAL HYSTERECTOMY    . APPENDECTOMY    . BACK SURGERY    . CARPAL TUNNEL RELEASE     bilaterally  . CATARACT EXTRACTION W/ INTRAOCULAR LENS  IMPLANT, BILATERAL    . DILATION AND CURETTAGE OF UTERUS    . EYE SURGERY     cataract ext/ iol implants  . KNEE ARTHROSCOPY    . LUMBAR LAMINECTOMY  03/2011; 01/2012  . MOUTH SURGERY    . SPLENECTOMY    . TONSILLECTOMY AND ADENOIDECTOMY    . TOTAL ABDOMINAL HYSTERECTOMY      Current Medications: Current Meds  Medication Sig  . acarbose (PRECOSE) 100 MG tablet Take 100 mg by mouth 3 (three) times daily with meals.  Marland Kitchen alendronate (FOSAMAX) 70 MG tablet Take 70 mg by mouth once a week.   Marland Kitchen aspirin EC 81 MG tablet Take 1 tablet (81 mg total) by mouth daily.  Marland Kitchen buPROPion (WELLBUTRIN XL) 300 MG 24 hr tablet Take 300 mg by mouth daily.  . carvedilol (COREG) 25 MG tablet Take 25 mg by mouth 2 (two) times daily.  . CRESTOR 5 MG tablet Take 1 tablet (5 mg total) by mouth daily.  . diflunisal (DOLOBID) 500 MG TABS Take 1 tablet by mouth 2 (two) times daily.  Marland Kitchen diltiazem (DILACOR XR) 180 MG 24 hr capsule Take 1 capsule (180 mg total) by mouth at bedtime.  Marland Kitchen doxazosin (CARDURA) 4 MG tablet Take 1 tablet (4 mg total) by mouth at  bedtime.  . ergocalciferol (VITAMIN D2) 50000 UNITS capsule Take 50,000 Units by mouth once a week. Monday  . gabapentin (NEURONTIN) 600 MG tablet Take 600 mg by mouth 4 (four) times daily.   . irbesartan-hydrochlorothiazide (AVALIDE) 300-12.5 MG tablet Take 1 tablet by mouth daily.  . isosorbide mononitrate (IMDUR) 60 MG 24 hr tablet Take 60 mg by mouth daily.  Marland Kitchen levothyroxine (SYNTHROID, LEVOTHROID) 125 MCG tablet Take 125 mcg by mouth daily before breakfast.   . magnesium gluconate (MAGONATE) 500 MG tablet Take  500 mg by mouth once a week.  . metFORMIN (GLUCOPHAGE-XR) 500 MG 24 hr tablet Take 500 mg by mouth daily.  Marland Kitchen NITROSTAT 0.4 MG SL tablet Place 0.4 mg under the tongue every 5 (five) minutes as needed for chest pain.   Marland Kitchen omeprazole (PRILOSEC) 40 MG capsule Take 1 capsule by mouth 2 (two) times daily.  . potassium chloride SA (K-DUR,KLOR-CON) 20 MEQ tablet Take 40 mEq by mouth 3 (three) times daily.   . traMADol (ULTRAM) 50 MG tablet Take 50 mg by mouth every 6 (six) hours as needed. For pain  . venlafaxine XR (EFFEXOR-XR) 150 MG 24 hr capsule Take 150 mg by mouth daily with breakfast.   . [DISCONTINUED] diltiazem (DILACOR XR) 180 MG 24 hr capsule Take 1 capsule (180 mg total) by mouth at bedtime. (Patient taking differently: Take 180 mg by mouth daily. )     Allergies:   Azithromycin; Cefuroxime axetil; Celecoxib; Codeine; Sulfa antibiotics; Hydralazine hcl; and Norvasc [amlodipine besylate]   Social History   Socioeconomic History  . Marital status: Married    Spouse name: Not on file  . Number of children: Not on file  . Years of education: Not on file  . Highest education level: Not on file  Occupational History  . Not on file  Social Needs  . Financial resource strain: Not on file  . Food insecurity:    Worry: Not on file    Inability: Not on file  . Transportation needs:    Medical: Not on file    Non-medical: Not on file  Tobacco Use  . Smoking status: Never Smoker    . Smokeless tobacco: Never Used  Substance and Sexual Activity  . Alcohol use: No  . Drug use: No  . Sexual activity: Not on file  Lifestyle  . Physical activity:    Days per week: Not on file    Minutes per session: Not on file  . Stress: Not on file  Relationships  . Social connections:    Talks on phone: Not on file    Gets together: Not on file    Attends religious service: Not on file    Active member of club or organization: Not on file    Attends meetings of clubs or organizations: Not on file    Relationship status: Not on file  Other Topics Concern  . Not on file  Social History Narrative  . Not on file     Family History: The patient's family history includes Anesthesia problems in her mother; CAD in her father; Cancer in her mother; Diabetes in her paternal grandmother; Emphysema in her mother; Heart attack in her father; Hypertension in her father; Rheum arthritis in her mother; Stroke in her father. ROS:   Please see the history of present illness.    All other systems reviewed and are negative.  EKGs/Labs/Other Studies Reviewed:    The following studies were reviewed today:  EKG:  EKG ordered today.  The ekg ordered today demonstrates atrial fibrillation controlled rate 55 to 60 bpm  Recent Labs:   Chol 103, HDL 33 LDL 46 a1c 7.1% Plt 150000  CBC normal BMP normal at [facility]   Physical Exam:    VS:  BP 128/64 (BP Location: Right Arm, Patient Position: Sitting, Cuff Size: Large)   Pulse 67   Ht 5\' 3"  (1.6 m)   Wt 215 lb 6.4 oz (97.7 kg)   SpO2 96%   BMI 38.16 kg/m  Wt Readings from Last 3 Encounters:  08/05/18 215 lb 6.4 oz (97.7 kg)  01/30/18 231 lb (104.8 kg)  07/12/17 222 lb 12.8 oz (101.1 kg)     GEN:  Well nourished, well developed in no acute distress HEENT: Normal NECK: No JVD; No carotid bruits LYMPHATICS: No lymphadenopathy CARDIAC: RRR, no murmurs, rubs, gallops RESPIRATORY:  Clear to auscultation without rales, wheezing or rhonchi   ABDOMEN: Soft, non-tender, non-distended MUSCULOSKELETAL:  No edema; No deformity  SKIN: Warm and dry NEUROLOGIC:  Alert and oriented x 3 PSYCHIATRIC:  Normal affect    Signed, Shirlee More, MD  08/05/2018 4:10 PM    [facility]

## 2018-08-05 NOTE — Patient Instructions (Addendum)
Medication Instructions:  Your physician recommends that you continue on your current medications as directed. Please refer to the Current Medication list given to you today.  Labwork: None  Testing/Procedures: You had an EKG today.  Follow-Up: Your physician wants you to follow-up in: 6 months. You will receive a reminder letter in the mail two months in advance. If you don't receive a letter, please call our office to schedule the follow-up appointment.  **You had been referred to Dr. Nila Nephew with urology. You will be contacted to schedule this appointment.   If you need a refill on your cardiac medications before your next appointment, please call your pharmacy.   Thank you for choosing [facility]! Robyne Peers, RN [phone]

## 2018-08-06 DIAGNOSIS — K7460 Unspecified cirrhosis of liver: Secondary | ICD-10-CM | POA: Diagnosis not present

## 2018-08-06 DIAGNOSIS — E1140 Type 2 diabetes mellitus with diabetic neuropathy, unspecified: Secondary | ICD-10-CM | POA: Diagnosis not present

## 2018-08-06 DIAGNOSIS — K7581 Nonalcoholic steatohepatitis (NASH): Secondary | ICD-10-CM | POA: Diagnosis not present

## 2018-08-06 DIAGNOSIS — I2510 Atherosclerotic heart disease of native coronary artery without angina pectoris: Secondary | ICD-10-CM | POA: Diagnosis not present

## 2018-08-06 DIAGNOSIS — Z87440 Personal history of urinary (tract) infections: Secondary | ICD-10-CM | POA: Diagnosis not present

## 2018-08-06 DIAGNOSIS — D6959 Other secondary thrombocytopenia: Secondary | ICD-10-CM | POA: Diagnosis not present

## 2018-08-07 DIAGNOSIS — E1140 Type 2 diabetes mellitus with diabetic neuropathy, unspecified: Secondary | ICD-10-CM | POA: Diagnosis not present

## 2018-08-07 DIAGNOSIS — I2510 Atherosclerotic heart disease of native coronary artery without angina pectoris: Secondary | ICD-10-CM | POA: Diagnosis not present

## 2018-08-07 DIAGNOSIS — K7581 Nonalcoholic steatohepatitis (NASH): Secondary | ICD-10-CM | POA: Diagnosis not present

## 2018-08-07 DIAGNOSIS — D6959 Other secondary thrombocytopenia: Secondary | ICD-10-CM | POA: Diagnosis not present

## 2018-08-07 DIAGNOSIS — K7460 Unspecified cirrhosis of liver: Secondary | ICD-10-CM | POA: Diagnosis not present

## 2018-08-07 DIAGNOSIS — Z87440 Personal history of urinary (tract) infections: Secondary | ICD-10-CM | POA: Diagnosis not present

## 2018-08-08 DIAGNOSIS — I2510 Atherosclerotic heart disease of native coronary artery without angina pectoris: Secondary | ICD-10-CM | POA: Diagnosis not present

## 2018-08-08 DIAGNOSIS — Z87440 Personal history of urinary (tract) infections: Secondary | ICD-10-CM | POA: Diagnosis not present

## 2018-08-08 DIAGNOSIS — K7581 Nonalcoholic steatohepatitis (NASH): Secondary | ICD-10-CM | POA: Diagnosis not present

## 2018-08-08 DIAGNOSIS — E1140 Type 2 diabetes mellitus with diabetic neuropathy, unspecified: Secondary | ICD-10-CM | POA: Diagnosis not present

## 2018-08-08 DIAGNOSIS — K7460 Unspecified cirrhosis of liver: Secondary | ICD-10-CM | POA: Diagnosis not present

## 2018-08-08 DIAGNOSIS — D6959 Other secondary thrombocytopenia: Secondary | ICD-10-CM | POA: Diagnosis not present

## 2018-08-11 DIAGNOSIS — K7581 Nonalcoholic steatohepatitis (NASH): Secondary | ICD-10-CM | POA: Diagnosis not present

## 2018-08-11 DIAGNOSIS — Z87440 Personal history of urinary (tract) infections: Secondary | ICD-10-CM | POA: Diagnosis not present

## 2018-08-11 DIAGNOSIS — D6959 Other secondary thrombocytopenia: Secondary | ICD-10-CM | POA: Diagnosis not present

## 2018-08-11 DIAGNOSIS — I2510 Atherosclerotic heart disease of native coronary artery without angina pectoris: Secondary | ICD-10-CM | POA: Diagnosis not present

## 2018-08-11 DIAGNOSIS — K7460 Unspecified cirrhosis of liver: Secondary | ICD-10-CM | POA: Diagnosis not present

## 2018-08-11 DIAGNOSIS — E1140 Type 2 diabetes mellitus with diabetic neuropathy, unspecified: Secondary | ICD-10-CM | POA: Diagnosis not present

## 2018-08-13 DIAGNOSIS — I10 Essential (primary) hypertension: Secondary | ICD-10-CM | POA: Diagnosis not present

## 2018-08-13 DIAGNOSIS — I2510 Atherosclerotic heart disease of native coronary artery without angina pectoris: Secondary | ICD-10-CM | POA: Diagnosis not present

## 2018-08-13 DIAGNOSIS — K7460 Unspecified cirrhosis of liver: Secondary | ICD-10-CM | POA: Diagnosis not present

## 2018-08-13 DIAGNOSIS — D6959 Other secondary thrombocytopenia: Secondary | ICD-10-CM | POA: Diagnosis not present

## 2018-08-13 DIAGNOSIS — Z79899 Other long term (current) drug therapy: Secondary | ICD-10-CM | POA: Diagnosis not present

## 2018-08-13 DIAGNOSIS — K7581 Nonalcoholic steatohepatitis (NASH): Secondary | ICD-10-CM | POA: Diagnosis not present

## 2018-08-13 DIAGNOSIS — Z87440 Personal history of urinary (tract) infections: Secondary | ICD-10-CM | POA: Diagnosis not present

## 2018-08-13 DIAGNOSIS — E612 Magnesium deficiency: Secondary | ICD-10-CM | POA: Diagnosis not present

## 2018-08-13 DIAGNOSIS — E1140 Type 2 diabetes mellitus with diabetic neuropathy, unspecified: Secondary | ICD-10-CM | POA: Diagnosis not present

## 2018-08-13 DIAGNOSIS — N390 Urinary tract infection, site not specified: Secondary | ICD-10-CM | POA: Diagnosis not present

## 2018-08-14 DIAGNOSIS — E1140 Type 2 diabetes mellitus with diabetic neuropathy, unspecified: Secondary | ICD-10-CM | POA: Diagnosis not present

## 2018-08-14 DIAGNOSIS — R32 Unspecified urinary incontinence: Secondary | ICD-10-CM | POA: Diagnosis not present

## 2018-08-14 DIAGNOSIS — K7581 Nonalcoholic steatohepatitis (NASH): Secondary | ICD-10-CM | POA: Diagnosis not present

## 2018-08-14 DIAGNOSIS — Z87440 Personal history of urinary (tract) infections: Secondary | ICD-10-CM | POA: Diagnosis not present

## 2018-08-14 DIAGNOSIS — K7460 Unspecified cirrhosis of liver: Secondary | ICD-10-CM | POA: Diagnosis not present

## 2018-08-14 DIAGNOSIS — D6959 Other secondary thrombocytopenia: Secondary | ICD-10-CM | POA: Diagnosis not present

## 2018-08-14 DIAGNOSIS — I2510 Atherosclerotic heart disease of native coronary artery without angina pectoris: Secondary | ICD-10-CM | POA: Diagnosis not present

## 2018-08-14 DIAGNOSIS — N390 Urinary tract infection, site not specified: Secondary | ICD-10-CM | POA: Diagnosis not present

## 2018-08-15 DIAGNOSIS — E1140 Type 2 diabetes mellitus with diabetic neuropathy, unspecified: Secondary | ICD-10-CM | POA: Diagnosis not present

## 2018-08-15 DIAGNOSIS — Z87440 Personal history of urinary (tract) infections: Secondary | ICD-10-CM | POA: Diagnosis not present

## 2018-08-15 DIAGNOSIS — D6959 Other secondary thrombocytopenia: Secondary | ICD-10-CM | POA: Diagnosis not present

## 2018-08-15 DIAGNOSIS — K7581 Nonalcoholic steatohepatitis (NASH): Secondary | ICD-10-CM | POA: Diagnosis not present

## 2018-08-15 DIAGNOSIS — I2510 Atherosclerotic heart disease of native coronary artery without angina pectoris: Secondary | ICD-10-CM | POA: Diagnosis not present

## 2018-08-15 DIAGNOSIS — K7460 Unspecified cirrhosis of liver: Secondary | ICD-10-CM | POA: Diagnosis not present

## 2018-08-18 DIAGNOSIS — Z87440 Personal history of urinary (tract) infections: Secondary | ICD-10-CM | POA: Diagnosis not present

## 2018-08-18 DIAGNOSIS — E1140 Type 2 diabetes mellitus with diabetic neuropathy, unspecified: Secondary | ICD-10-CM | POA: Diagnosis not present

## 2018-08-18 DIAGNOSIS — K7581 Nonalcoholic steatohepatitis (NASH): Secondary | ICD-10-CM | POA: Diagnosis not present

## 2018-08-18 DIAGNOSIS — I2510 Atherosclerotic heart disease of native coronary artery without angina pectoris: Secondary | ICD-10-CM | POA: Diagnosis not present

## 2018-08-18 DIAGNOSIS — D6959 Other secondary thrombocytopenia: Secondary | ICD-10-CM | POA: Diagnosis not present

## 2018-08-18 DIAGNOSIS — K7460 Unspecified cirrhosis of liver: Secondary | ICD-10-CM | POA: Diagnosis not present

## 2018-08-19 DIAGNOSIS — D6959 Other secondary thrombocytopenia: Secondary | ICD-10-CM | POA: Diagnosis not present

## 2018-08-19 DIAGNOSIS — Z87440 Personal history of urinary (tract) infections: Secondary | ICD-10-CM | POA: Diagnosis not present

## 2018-08-19 DIAGNOSIS — K7460 Unspecified cirrhosis of liver: Secondary | ICD-10-CM | POA: Diagnosis not present

## 2018-08-19 DIAGNOSIS — I2510 Atherosclerotic heart disease of native coronary artery without angina pectoris: Secondary | ICD-10-CM | POA: Diagnosis not present

## 2018-08-19 DIAGNOSIS — K7581 Nonalcoholic steatohepatitis (NASH): Secondary | ICD-10-CM | POA: Diagnosis not present

## 2018-08-19 DIAGNOSIS — E1140 Type 2 diabetes mellitus with diabetic neuropathy, unspecified: Secondary | ICD-10-CM | POA: Diagnosis not present

## 2018-08-21 DIAGNOSIS — I2510 Atherosclerotic heart disease of native coronary artery without angina pectoris: Secondary | ICD-10-CM | POA: Diagnosis not present

## 2018-08-21 DIAGNOSIS — K7460 Unspecified cirrhosis of liver: Secondary | ICD-10-CM | POA: Diagnosis not present

## 2018-08-21 DIAGNOSIS — K7581 Nonalcoholic steatohepatitis (NASH): Secondary | ICD-10-CM | POA: Diagnosis not present

## 2018-08-21 DIAGNOSIS — Z87440 Personal history of urinary (tract) infections: Secondary | ICD-10-CM | POA: Diagnosis not present

## 2018-08-21 DIAGNOSIS — E1140 Type 2 diabetes mellitus with diabetic neuropathy, unspecified: Secondary | ICD-10-CM | POA: Diagnosis not present

## 2018-08-21 DIAGNOSIS — D6959 Other secondary thrombocytopenia: Secondary | ICD-10-CM | POA: Diagnosis not present

## 2018-08-22 DIAGNOSIS — Z87440 Personal history of urinary (tract) infections: Secondary | ICD-10-CM | POA: Diagnosis not present

## 2018-08-22 DIAGNOSIS — I2510 Atherosclerotic heart disease of native coronary artery without angina pectoris: Secondary | ICD-10-CM | POA: Diagnosis not present

## 2018-08-22 DIAGNOSIS — K7581 Nonalcoholic steatohepatitis (NASH): Secondary | ICD-10-CM | POA: Diagnosis not present

## 2018-08-22 DIAGNOSIS — E1140 Type 2 diabetes mellitus with diabetic neuropathy, unspecified: Secondary | ICD-10-CM | POA: Diagnosis not present

## 2018-08-22 DIAGNOSIS — K7460 Unspecified cirrhosis of liver: Secondary | ICD-10-CM | POA: Diagnosis not present

## 2018-08-22 DIAGNOSIS — D6959 Other secondary thrombocytopenia: Secondary | ICD-10-CM | POA: Diagnosis not present

## 2018-08-25 DIAGNOSIS — D6959 Other secondary thrombocytopenia: Secondary | ICD-10-CM | POA: Diagnosis not present

## 2018-08-25 DIAGNOSIS — I2510 Atherosclerotic heart disease of native coronary artery without angina pectoris: Secondary | ICD-10-CM | POA: Diagnosis not present

## 2018-08-25 DIAGNOSIS — K7581 Nonalcoholic steatohepatitis (NASH): Secondary | ICD-10-CM | POA: Diagnosis not present

## 2018-08-25 DIAGNOSIS — K7460 Unspecified cirrhosis of liver: Secondary | ICD-10-CM | POA: Diagnosis not present

## 2018-08-25 DIAGNOSIS — E1140 Type 2 diabetes mellitus with diabetic neuropathy, unspecified: Secondary | ICD-10-CM | POA: Diagnosis not present

## 2018-08-25 DIAGNOSIS — Z87440 Personal history of urinary (tract) infections: Secondary | ICD-10-CM | POA: Diagnosis not present

## 2018-08-26 DIAGNOSIS — D6959 Other secondary thrombocytopenia: Secondary | ICD-10-CM | POA: Diagnosis not present

## 2018-08-26 DIAGNOSIS — K7581 Nonalcoholic steatohepatitis (NASH): Secondary | ICD-10-CM | POA: Diagnosis not present

## 2018-08-26 DIAGNOSIS — E1140 Type 2 diabetes mellitus with diabetic neuropathy, unspecified: Secondary | ICD-10-CM | POA: Diagnosis not present

## 2018-08-26 DIAGNOSIS — Z87440 Personal history of urinary (tract) infections: Secondary | ICD-10-CM | POA: Diagnosis not present

## 2018-08-26 DIAGNOSIS — K7460 Unspecified cirrhosis of liver: Secondary | ICD-10-CM | POA: Diagnosis not present

## 2018-08-26 DIAGNOSIS — I2510 Atherosclerotic heart disease of native coronary artery without angina pectoris: Secondary | ICD-10-CM | POA: Diagnosis not present

## 2018-08-27 DIAGNOSIS — K7460 Unspecified cirrhosis of liver: Secondary | ICD-10-CM | POA: Diagnosis not present

## 2018-08-27 DIAGNOSIS — E1140 Type 2 diabetes mellitus with diabetic neuropathy, unspecified: Secondary | ICD-10-CM | POA: Diagnosis not present

## 2018-08-27 DIAGNOSIS — Z87440 Personal history of urinary (tract) infections: Secondary | ICD-10-CM | POA: Diagnosis not present

## 2018-08-27 DIAGNOSIS — D6959 Other secondary thrombocytopenia: Secondary | ICD-10-CM | POA: Diagnosis not present

## 2018-08-27 DIAGNOSIS — K7581 Nonalcoholic steatohepatitis (NASH): Secondary | ICD-10-CM | POA: Diagnosis not present

## 2018-08-27 DIAGNOSIS — I2510 Atherosclerotic heart disease of native coronary artery without angina pectoris: Secondary | ICD-10-CM | POA: Diagnosis not present

## 2018-08-28 DIAGNOSIS — E1140 Type 2 diabetes mellitus with diabetic neuropathy, unspecified: Secondary | ICD-10-CM | POA: Diagnosis not present

## 2018-08-28 DIAGNOSIS — I2510 Atherosclerotic heart disease of native coronary artery without angina pectoris: Secondary | ICD-10-CM | POA: Diagnosis not present

## 2018-08-28 DIAGNOSIS — Z87440 Personal history of urinary (tract) infections: Secondary | ICD-10-CM | POA: Diagnosis not present

## 2018-08-28 DIAGNOSIS — K7581 Nonalcoholic steatohepatitis (NASH): Secondary | ICD-10-CM | POA: Diagnosis not present

## 2018-08-28 DIAGNOSIS — K7460 Unspecified cirrhosis of liver: Secondary | ICD-10-CM | POA: Diagnosis not present

## 2018-08-28 DIAGNOSIS — D6959 Other secondary thrombocytopenia: Secondary | ICD-10-CM | POA: Diagnosis not present

## 2018-09-02 DIAGNOSIS — K7581 Nonalcoholic steatohepatitis (NASH): Secondary | ICD-10-CM | POA: Diagnosis not present

## 2018-09-02 DIAGNOSIS — K7460 Unspecified cirrhosis of liver: Secondary | ICD-10-CM | POA: Diagnosis not present

## 2018-09-02 DIAGNOSIS — D6959 Other secondary thrombocytopenia: Secondary | ICD-10-CM | POA: Diagnosis not present

## 2018-09-02 DIAGNOSIS — Z87440 Personal history of urinary (tract) infections: Secondary | ICD-10-CM | POA: Diagnosis not present

## 2018-09-02 DIAGNOSIS — E1140 Type 2 diabetes mellitus with diabetic neuropathy, unspecified: Secondary | ICD-10-CM | POA: Diagnosis not present

## 2018-09-02 DIAGNOSIS — I2510 Atherosclerotic heart disease of native coronary artery without angina pectoris: Secondary | ICD-10-CM | POA: Diagnosis not present

## 2018-09-03 DIAGNOSIS — I2510 Atherosclerotic heart disease of native coronary artery without angina pectoris: Secondary | ICD-10-CM | POA: Diagnosis not present

## 2018-09-03 DIAGNOSIS — Z87440 Personal history of urinary (tract) infections: Secondary | ICD-10-CM | POA: Diagnosis not present

## 2018-09-03 DIAGNOSIS — K7460 Unspecified cirrhosis of liver: Secondary | ICD-10-CM | POA: Diagnosis not present

## 2018-09-03 DIAGNOSIS — K7581 Nonalcoholic steatohepatitis (NASH): Secondary | ICD-10-CM | POA: Diagnosis not present

## 2018-09-03 DIAGNOSIS — E1140 Type 2 diabetes mellitus with diabetic neuropathy, unspecified: Secondary | ICD-10-CM | POA: Diagnosis not present

## 2018-09-03 DIAGNOSIS — D6959 Other secondary thrombocytopenia: Secondary | ICD-10-CM | POA: Diagnosis not present

## 2018-09-04 DIAGNOSIS — N3090 Cystitis, unspecified without hematuria: Secondary | ICD-10-CM | POA: Diagnosis not present

## 2018-09-04 DIAGNOSIS — R32 Unspecified urinary incontinence: Secondary | ICD-10-CM | POA: Diagnosis not present

## 2018-09-05 DIAGNOSIS — Z6835 Body mass index (BMI) 35.0-35.9, adult: Secondary | ICD-10-CM | POA: Diagnosis not present

## 2018-09-05 DIAGNOSIS — E1142 Type 2 diabetes mellitus with diabetic polyneuropathy: Secondary | ICD-10-CM | POA: Diagnosis not present

## 2018-09-05 DIAGNOSIS — I2510 Atherosclerotic heart disease of native coronary artery without angina pectoris: Secondary | ICD-10-CM | POA: Diagnosis not present

## 2018-09-09 ENCOUNTER — Other Ambulatory Visit: Payer: Self-pay

## 2018-09-09 DIAGNOSIS — Z87440 Personal history of urinary (tract) infections: Secondary | ICD-10-CM | POA: Diagnosis not present

## 2018-09-09 DIAGNOSIS — I2510 Atherosclerotic heart disease of native coronary artery without angina pectoris: Secondary | ICD-10-CM | POA: Diagnosis not present

## 2018-09-09 DIAGNOSIS — D6959 Other secondary thrombocytopenia: Secondary | ICD-10-CM | POA: Diagnosis not present

## 2018-09-09 DIAGNOSIS — K7581 Nonalcoholic steatohepatitis (NASH): Secondary | ICD-10-CM | POA: Diagnosis not present

## 2018-09-09 DIAGNOSIS — E1140 Type 2 diabetes mellitus with diabetic neuropathy, unspecified: Secondary | ICD-10-CM | POA: Diagnosis not present

## 2018-09-09 DIAGNOSIS — K7460 Unspecified cirrhosis of liver: Secondary | ICD-10-CM | POA: Diagnosis not present

## 2018-09-09 MED ORDER — CRESTOR 5 MG PO TABS: 5.0000 mg | ORAL_TABLET | Freq: Every day | ORAL | 1 refills | Status: DC

## 2018-09-11 DIAGNOSIS — K7460 Unspecified cirrhosis of liver: Secondary | ICD-10-CM | POA: Diagnosis not present

## 2018-09-11 DIAGNOSIS — I2510 Atherosclerotic heart disease of native coronary artery without angina pectoris: Secondary | ICD-10-CM | POA: Diagnosis not present

## 2018-09-11 DIAGNOSIS — K7581 Nonalcoholic steatohepatitis (NASH): Secondary | ICD-10-CM | POA: Diagnosis not present

## 2018-09-11 DIAGNOSIS — D6959 Other secondary thrombocytopenia: Secondary | ICD-10-CM | POA: Diagnosis not present

## 2018-09-11 DIAGNOSIS — E1140 Type 2 diabetes mellitus with diabetic neuropathy, unspecified: Secondary | ICD-10-CM | POA: Diagnosis not present

## 2018-09-11 DIAGNOSIS — Z87440 Personal history of urinary (tract) infections: Secondary | ICD-10-CM | POA: Diagnosis not present

## 2018-09-15 DIAGNOSIS — I10 Essential (primary) hypertension: Secondary | ICD-10-CM | POA: Diagnosis not present

## 2018-09-15 DIAGNOSIS — R3915 Urgency of urination: Secondary | ICD-10-CM | POA: Diagnosis not present

## 2018-09-15 DIAGNOSIS — Z23 Encounter for immunization: Secondary | ICD-10-CM | POA: Diagnosis not present

## 2018-09-15 DIAGNOSIS — E612 Magnesium deficiency: Secondary | ICD-10-CM | POA: Diagnosis not present

## 2018-09-16 DIAGNOSIS — K7460 Unspecified cirrhosis of liver: Secondary | ICD-10-CM | POA: Diagnosis not present

## 2018-09-16 DIAGNOSIS — K7581 Nonalcoholic steatohepatitis (NASH): Secondary | ICD-10-CM | POA: Diagnosis not present

## 2018-09-16 DIAGNOSIS — Z87440 Personal history of urinary (tract) infections: Secondary | ICD-10-CM | POA: Diagnosis not present

## 2018-09-16 DIAGNOSIS — E1140 Type 2 diabetes mellitus with diabetic neuropathy, unspecified: Secondary | ICD-10-CM | POA: Diagnosis not present

## 2018-09-16 DIAGNOSIS — D6959 Other secondary thrombocytopenia: Secondary | ICD-10-CM | POA: Diagnosis not present

## 2018-09-16 DIAGNOSIS — I2510 Atherosclerotic heart disease of native coronary artery without angina pectoris: Secondary | ICD-10-CM | POA: Diagnosis not present

## 2018-09-23 DIAGNOSIS — D6959 Other secondary thrombocytopenia: Secondary | ICD-10-CM | POA: Diagnosis not present

## 2018-09-23 DIAGNOSIS — E1140 Type 2 diabetes mellitus with diabetic neuropathy, unspecified: Secondary | ICD-10-CM | POA: Diagnosis not present

## 2018-09-23 DIAGNOSIS — K7581 Nonalcoholic steatohepatitis (NASH): Secondary | ICD-10-CM | POA: Diagnosis not present

## 2018-09-23 DIAGNOSIS — I2510 Atherosclerotic heart disease of native coronary artery without angina pectoris: Secondary | ICD-10-CM | POA: Diagnosis not present

## 2018-09-23 DIAGNOSIS — Z87440 Personal history of urinary (tract) infections: Secondary | ICD-10-CM | POA: Diagnosis not present

## 2018-09-23 DIAGNOSIS — K7460 Unspecified cirrhosis of liver: Secondary | ICD-10-CM | POA: Diagnosis not present

## 2018-09-24 DIAGNOSIS — K7581 Nonalcoholic steatohepatitis (NASH): Secondary | ICD-10-CM | POA: Diagnosis not present

## 2018-09-24 DIAGNOSIS — D6959 Other secondary thrombocytopenia: Secondary | ICD-10-CM | POA: Diagnosis not present

## 2018-09-24 DIAGNOSIS — I2510 Atherosclerotic heart disease of native coronary artery without angina pectoris: Secondary | ICD-10-CM | POA: Diagnosis not present

## 2018-09-24 DIAGNOSIS — Z87440 Personal history of urinary (tract) infections: Secondary | ICD-10-CM | POA: Diagnosis not present

## 2018-09-24 DIAGNOSIS — E1140 Type 2 diabetes mellitus with diabetic neuropathy, unspecified: Secondary | ICD-10-CM | POA: Diagnosis not present

## 2018-09-24 DIAGNOSIS — K7460 Unspecified cirrhosis of liver: Secondary | ICD-10-CM | POA: Diagnosis not present

## 2018-09-25 ENCOUNTER — Encounter: Payer: Self-pay | Admitting: Internal Medicine

## 2018-09-25 ENCOUNTER — Ambulatory Visit (INDEPENDENT_AMBULATORY_CARE_PROVIDER_SITE_OTHER): Payer: Medicare Other | Admitting: Internal Medicine

## 2018-09-25 VITALS — BP 130/62 | HR 73 | Ht 62.5 in | Wt 213.2 lb

## 2018-09-25 DIAGNOSIS — G4733 Obstructive sleep apnea (adult) (pediatric): Secondary | ICD-10-CM | POA: Diagnosis not present

## 2018-09-25 DIAGNOSIS — I482 Chronic atrial fibrillation, unspecified: Secondary | ICD-10-CM

## 2018-09-25 DIAGNOSIS — I2510 Atherosclerotic heart disease of native coronary artery without angina pectoris: Secondary | ICD-10-CM | POA: Diagnosis not present

## 2018-09-25 NOTE — Patient Instructions (Signed)
Order- DME [facility]PS- please replace old CPAP machine, auto 5- 20, mask of choice, humidifier, supplies, AirView  Remember- you need to get to bed at a regular bedtime, put your CPAP on right away, and wear it all night to protect your heart.   Please call if we can help

## 2018-09-25 NOTE — Assessment & Plan Note (Signed)
Rhythm is pretty well controlled and she is not in overt heart failure at this visit.  Followed by cardiology.

## 2018-09-25 NOTE — Assessment & Plan Note (Signed)
She was in hospital recently impacting compliance.  Admits to falling asleep frequently in her chair before finally going to bed and putting CPAP on.  There is some problem with limit setting and sleep habits.  We discussed compliance goals.  Machine is old. Plan-replace old CPAP machine auto 5-20, emphasis on compliance goals

## 2018-09-25 NOTE — Progress Notes (Signed)
HPI F never smoker, followed for OSA, chronic bronchitis, allergic rhinitis, complicated by nonalcoholic cirrhosis/[name] GI,   A. Fib/"Watchman" clot- preventing device placed in her atrial appendage , HBP, MI, chronic diastolic CHF, GERD, DM 2, hypothyroid NPSG 11/05/01-AHI 93/hour, desaturation to 63%, body weight 217 pounds PFT 07/26/09-mild restriction and reduction of diffusion. No obstruction, no response to dilator -----------------------------------------------------------------------------------  07/12/17- 72 year old female never smoker followed for OSA, chronic bronchitis, allergic rhinitis, complicated by nonalcoholic cirrhosis/[name] GI,  AFib/"Watchman" clot- preventing device placed in her atrial appendage, HBP, MI, chronic diastolic CHF, GERD, DM 2, hypothyroid CPAP auto 5-15/APS Pt states that she has been doing good. 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 DME: [facility].  Download shows 33% compliance with AHI 9.9/hour. She says she is restless at night, moves around, and "it comes off". Admits she falls asleep in her chair often and stays there for the night short of breath when she first lies down, otherwise breathing is comfortable with no acute events. Not much rhinitis this spring. She doesn't feel she needs her inhalers any longer and isn't using them.  09/25/2018- 72 year old female never smoker followed for OSA, chronic bronchitis, allergic rhinitis, complicated by nonalcoholic cirrhosis/[name] GI,  AFib/"Watchman" clot- preventing device placed in her atrial appendage, HBP, MI, chronic diastolic CHF, GERD, DM 2, hypothyroid CPAP auto 5-15/APS>> today replace machine and change to auto 5-20 -----OSA; DME [facility]. Pt tries to wear CPAP nightly for at least 4 hours. DL attached.  Was hospitalized recently at [facility] with urinary infection/sepsis/ICU and is now on Levaquin. Download 33%  compliance AHI 9.5/hour.  Machine is much more than 72 years old and not functioning correctly.  She admits she is casual about bedtime and often falls asleep in chair, then husband has trouble arousing her.  She asks if she might have narcolepsy.  She has had a few episodes that sound like sleep paralysis and has frequent very vivid dreams/nightmares.  "I cannot sleep without my CPAP". Breathing has been comfortable.  Cardiology follows her closely.  ROS-see HPI  + = positive Constitutional:   No-   weight loss, night sweats, fevers, chills, + fatigue, lassitude. HEENT:   No-  headaches, difficulty swallowing, tooth/dental problems, sore throat,       No-  sneezing, itching, ear ache, nasal congestion, post nasal drip,  CV:  No-   chest pain, orthopnea, PND, swelling in lower extremities, anasarca, dizziness, palpitations Resp: No-   shortness of breath with exertion or at rest.             productive cough,  No non-productive cough,  No- coughing up of blood.              No-   change in color of mucus.  No- wheezing.   Skin: No-   rash or lesions. GI:  No-   heartburn, indigestion, abdominal pain, nausea, vomiting,  GU:  MS:  + joint pain or swelling. + back pain. Neuro-     nothing unusual Psych:  No- change in mood or affect. No depression or anxiety.  No memory loss.  OBJ- Physical Exam General- Alert, Oriented, Affect-appropriate, Distress- none acute, + overweight Skin-  + ecchymoses on arms Lymphadenopathy- none Head- atraumatic            Eyes- Gross vision intact, PERRLA, conjunctivae and secretions clear  Ears- Hearing, canals-normal            Nose- Clear, no-Septal dev, mucus, polyps, erosion, perforation             Throat- Mallampati II-III , mucosa clear , drainage- none, tonsils- atrophic Neck- flexible , trachea midline, no stridor , thyroid nl, carotid no bruit Chest - symmetrical excursion , unlabored           Heart/CV- RRR + almost regular to exam , no  murmur , no gallop  , no rub, nl s1 s2                           - JVD- none , edema- none, stasis changes +, varices- none           Lung- clear to P&A, wheeze- none, cough- none , dullness-none, rub- none           Chest wall-  Abd-  Br/ Gen/ Rectal- Not done, not indicated Extrem- cyanosis- none, clubbing, none, atrophy- none, strength- nl. + Rolling walker. Neuro- grossly intact to observation

## 2018-09-29 DIAGNOSIS — Z87440 Personal history of urinary (tract) infections: Secondary | ICD-10-CM | POA: Diagnosis not present

## 2018-09-29 DIAGNOSIS — I10 Essential (primary) hypertension: Secondary | ICD-10-CM | POA: Diagnosis not present

## 2018-09-29 DIAGNOSIS — F419 Anxiety disorder, unspecified: Secondary | ICD-10-CM | POA: Diagnosis not present

## 2018-09-29 DIAGNOSIS — N390 Urinary tract infection, site not specified: Secondary | ICD-10-CM | POA: Diagnosis not present

## 2018-09-29 DIAGNOSIS — I2510 Atherosclerotic heart disease of native coronary artery without angina pectoris: Secondary | ICD-10-CM | POA: Diagnosis not present

## 2018-09-29 DIAGNOSIS — I4891 Unspecified atrial fibrillation: Secondary | ICD-10-CM | POA: Diagnosis not present

## 2018-09-29 DIAGNOSIS — D6959 Other secondary thrombocytopenia: Secondary | ICD-10-CM | POA: Diagnosis not present

## 2018-09-29 DIAGNOSIS — K7581 Nonalcoholic steatohepatitis (NASH): Secondary | ICD-10-CM | POA: Diagnosis not present

## 2018-09-29 DIAGNOSIS — K219 Gastro-esophageal reflux disease without esophagitis: Secondary | ICD-10-CM | POA: Diagnosis not present

## 2018-09-29 DIAGNOSIS — R296 Repeated falls: Secondary | ICD-10-CM | POA: Diagnosis not present

## 2018-09-29 DIAGNOSIS — K7460 Unspecified cirrhosis of liver: Secondary | ICD-10-CM | POA: Diagnosis not present

## 2018-09-29 DIAGNOSIS — E1140 Type 2 diabetes mellitus with diabetic neuropathy, unspecified: Secondary | ICD-10-CM | POA: Diagnosis not present

## 2018-09-29 DIAGNOSIS — Z7984 Long term (current) use of oral hypoglycemic drugs: Secondary | ICD-10-CM | POA: Diagnosis not present

## 2018-09-29 DIAGNOSIS — M810 Age-related osteoporosis without current pathological fracture: Secondary | ICD-10-CM | POA: Diagnosis not present

## 2018-09-29 DIAGNOSIS — Z7982 Long term (current) use of aspirin: Secondary | ICD-10-CM | POA: Diagnosis not present

## 2018-09-29 DIAGNOSIS — F329 Major depressive disorder, single episode, unspecified: Secondary | ICD-10-CM | POA: Diagnosis not present

## 2018-09-30 DIAGNOSIS — I2510 Atherosclerotic heart disease of native coronary artery without angina pectoris: Secondary | ICD-10-CM | POA: Diagnosis not present

## 2018-09-30 DIAGNOSIS — K7460 Unspecified cirrhosis of liver: Secondary | ICD-10-CM | POA: Diagnosis not present

## 2018-09-30 DIAGNOSIS — E1140 Type 2 diabetes mellitus with diabetic neuropathy, unspecified: Secondary | ICD-10-CM | POA: Diagnosis not present

## 2018-09-30 DIAGNOSIS — D6959 Other secondary thrombocytopenia: Secondary | ICD-10-CM | POA: Diagnosis not present

## 2018-09-30 DIAGNOSIS — K7581 Nonalcoholic steatohepatitis (NASH): Secondary | ICD-10-CM | POA: Diagnosis not present

## 2018-09-30 DIAGNOSIS — N390 Urinary tract infection, site not specified: Secondary | ICD-10-CM | POA: Diagnosis not present

## 2018-10-01 DIAGNOSIS — E1140 Type 2 diabetes mellitus with diabetic neuropathy, unspecified: Secondary | ICD-10-CM | POA: Diagnosis not present

## 2018-10-01 DIAGNOSIS — F3341 Major depressive disorder, recurrent, in partial remission: Secondary | ICD-10-CM | POA: Diagnosis not present

## 2018-10-01 DIAGNOSIS — K7460 Unspecified cirrhosis of liver: Secondary | ICD-10-CM | POA: Diagnosis not present

## 2018-10-01 DIAGNOSIS — K7581 Nonalcoholic steatohepatitis (NASH): Secondary | ICD-10-CM | POA: Diagnosis not present

## 2018-10-01 DIAGNOSIS — I2510 Atherosclerotic heart disease of native coronary artery without angina pectoris: Secondary | ICD-10-CM | POA: Diagnosis not present

## 2018-10-01 DIAGNOSIS — Z87440 Personal history of urinary (tract) infections: Secondary | ICD-10-CM | POA: Diagnosis not present

## 2018-10-08 DIAGNOSIS — D6959 Other secondary thrombocytopenia: Secondary | ICD-10-CM | POA: Diagnosis not present

## 2018-10-08 DIAGNOSIS — N390 Urinary tract infection, site not specified: Secondary | ICD-10-CM | POA: Diagnosis not present

## 2018-10-08 DIAGNOSIS — I2510 Atherosclerotic heart disease of native coronary artery without angina pectoris: Secondary | ICD-10-CM | POA: Diagnosis not present

## 2018-10-08 DIAGNOSIS — K7460 Unspecified cirrhosis of liver: Secondary | ICD-10-CM | POA: Diagnosis not present

## 2018-10-08 DIAGNOSIS — K7581 Nonalcoholic steatohepatitis (NASH): Secondary | ICD-10-CM | POA: Diagnosis not present

## 2018-10-08 DIAGNOSIS — E1140 Type 2 diabetes mellitus with diabetic neuropathy, unspecified: Secondary | ICD-10-CM | POA: Diagnosis not present

## 2018-10-13 DIAGNOSIS — D6959 Other secondary thrombocytopenia: Secondary | ICD-10-CM | POA: Diagnosis not present

## 2018-10-13 DIAGNOSIS — K7460 Unspecified cirrhosis of liver: Secondary | ICD-10-CM | POA: Diagnosis not present

## 2018-10-13 DIAGNOSIS — E1140 Type 2 diabetes mellitus with diabetic neuropathy, unspecified: Secondary | ICD-10-CM | POA: Diagnosis not present

## 2018-10-13 DIAGNOSIS — I2510 Atherosclerotic heart disease of native coronary artery without angina pectoris: Secondary | ICD-10-CM | POA: Diagnosis not present

## 2018-10-13 DIAGNOSIS — N390 Urinary tract infection, site not specified: Secondary | ICD-10-CM | POA: Diagnosis not present

## 2018-10-13 DIAGNOSIS — K7581 Nonalcoholic steatohepatitis (NASH): Secondary | ICD-10-CM | POA: Diagnosis not present

## 2018-10-15 DIAGNOSIS — E113293 Type 2 diabetes mellitus with mild nonproliferative diabetic retinopathy without macular edema, bilateral: Secondary | ICD-10-CM | POA: Diagnosis not present

## 2018-10-15 DIAGNOSIS — Z6839 Body mass index (BMI) 39.0-39.9, adult: Secondary | ICD-10-CM | POA: Diagnosis not present

## 2018-10-15 DIAGNOSIS — Z86010 Personal history of colonic polyps: Secondary | ICD-10-CM | POA: Diagnosis not present

## 2018-10-15 DIAGNOSIS — Z9181 History of falling: Secondary | ICD-10-CM | POA: Diagnosis not present

## 2018-10-15 DIAGNOSIS — E1142 Type 2 diabetes mellitus with diabetic polyneuropathy: Secondary | ICD-10-CM | POA: Diagnosis not present

## 2018-10-15 DIAGNOSIS — Z0000 Encounter for general adult medical examination without abnormal findings: Secondary | ICD-10-CM | POA: Diagnosis not present

## 2018-10-15 DIAGNOSIS — F325 Major depressive disorder, single episode, in full remission: Secondary | ICD-10-CM | POA: Diagnosis not present

## 2018-10-15 DIAGNOSIS — E1122 Type 2 diabetes mellitus with diabetic chronic kidney disease: Secondary | ICD-10-CM | POA: Diagnosis not present

## 2018-10-22 DIAGNOSIS — N3090 Cystitis, unspecified without hematuria: Secondary | ICD-10-CM | POA: Diagnosis not present

## 2018-10-22 DIAGNOSIS — N3281 Overactive bladder: Secondary | ICD-10-CM | POA: Diagnosis not present

## 2018-10-24 DIAGNOSIS — D6959 Other secondary thrombocytopenia: Secondary | ICD-10-CM | POA: Diagnosis not present

## 2018-10-24 DIAGNOSIS — K7460 Unspecified cirrhosis of liver: Secondary | ICD-10-CM | POA: Diagnosis not present

## 2018-10-24 DIAGNOSIS — I2510 Atherosclerotic heart disease of native coronary artery without angina pectoris: Secondary | ICD-10-CM | POA: Diagnosis not present

## 2018-10-24 DIAGNOSIS — N390 Urinary tract infection, site not specified: Secondary | ICD-10-CM | POA: Diagnosis not present

## 2018-10-24 DIAGNOSIS — E1140 Type 2 diabetes mellitus with diabetic neuropathy, unspecified: Secondary | ICD-10-CM | POA: Diagnosis not present

## 2018-10-24 DIAGNOSIS — K7581 Nonalcoholic steatohepatitis (NASH): Secondary | ICD-10-CM | POA: Diagnosis not present

## 2018-10-29 ENCOUNTER — Other Ambulatory Visit: Payer: Self-pay

## 2018-10-29 DIAGNOSIS — K7581 Nonalcoholic steatohepatitis (NASH): Secondary | ICD-10-CM | POA: Diagnosis not present

## 2018-10-29 DIAGNOSIS — K7460 Unspecified cirrhosis of liver: Secondary | ICD-10-CM | POA: Diagnosis not present

## 2018-10-29 DIAGNOSIS — I2510 Atherosclerotic heart disease of native coronary artery without angina pectoris: Secondary | ICD-10-CM | POA: Diagnosis not present

## 2018-10-29 DIAGNOSIS — N390 Urinary tract infection, site not specified: Secondary | ICD-10-CM | POA: Diagnosis not present

## 2018-10-29 DIAGNOSIS — E1140 Type 2 diabetes mellitus with diabetic neuropathy, unspecified: Secondary | ICD-10-CM | POA: Diagnosis not present

## 2018-10-29 MED ORDER — DOXAZOSIN MESYLATE 4 MG PO TABS: 4.0000 mg | ORAL_TABLET | Freq: Every day | ORAL | 1 refills | Status: DC

## 2018-10-30 DIAGNOSIS — E1140 Type 2 diabetes mellitus with diabetic neuropathy, unspecified: Secondary | ICD-10-CM | POA: Diagnosis not present

## 2018-10-30 DIAGNOSIS — K7581 Nonalcoholic steatohepatitis (NASH): Secondary | ICD-10-CM | POA: Diagnosis not present

## 2018-10-30 DIAGNOSIS — D6959 Other secondary thrombocytopenia: Secondary | ICD-10-CM | POA: Diagnosis not present

## 2018-10-30 DIAGNOSIS — K7460 Unspecified cirrhosis of liver: Secondary | ICD-10-CM | POA: Diagnosis not present

## 2018-10-30 DIAGNOSIS — N390 Urinary tract infection, site not specified: Secondary | ICD-10-CM | POA: Diagnosis not present

## 2018-10-30 DIAGNOSIS — I2510 Atherosclerotic heart disease of native coronary artery without angina pectoris: Secondary | ICD-10-CM | POA: Diagnosis not present

## 2018-11-04 DIAGNOSIS — N390 Urinary tract infection, site not specified: Secondary | ICD-10-CM | POA: Diagnosis not present

## 2018-11-04 DIAGNOSIS — D6959 Other secondary thrombocytopenia: Secondary | ICD-10-CM | POA: Diagnosis not present

## 2018-11-04 DIAGNOSIS — K7581 Nonalcoholic steatohepatitis (NASH): Secondary | ICD-10-CM | POA: Diagnosis not present

## 2018-11-04 DIAGNOSIS — E1140 Type 2 diabetes mellitus with diabetic neuropathy, unspecified: Secondary | ICD-10-CM | POA: Diagnosis not present

## 2018-11-04 DIAGNOSIS — K7460 Unspecified cirrhosis of liver: Secondary | ICD-10-CM | POA: Diagnosis not present

## 2018-11-04 DIAGNOSIS — I2510 Atherosclerotic heart disease of native coronary artery without angina pectoris: Secondary | ICD-10-CM | POA: Diagnosis not present

## 2018-11-10 DIAGNOSIS — D6959 Other secondary thrombocytopenia: Secondary | ICD-10-CM | POA: Diagnosis not present

## 2018-11-10 DIAGNOSIS — K7581 Nonalcoholic steatohepatitis (NASH): Secondary | ICD-10-CM | POA: Diagnosis not present

## 2018-11-10 DIAGNOSIS — I2510 Atherosclerotic heart disease of native coronary artery without angina pectoris: Secondary | ICD-10-CM | POA: Diagnosis not present

## 2018-11-10 DIAGNOSIS — N390 Urinary tract infection, site not specified: Secondary | ICD-10-CM | POA: Diagnosis not present

## 2018-11-10 DIAGNOSIS — E1140 Type 2 diabetes mellitus with diabetic neuropathy, unspecified: Secondary | ICD-10-CM | POA: Diagnosis not present

## 2018-11-10 DIAGNOSIS — K7460 Unspecified cirrhosis of liver: Secondary | ICD-10-CM | POA: Diagnosis not present

## 2018-11-20 DIAGNOSIS — I2510 Atherosclerotic heart disease of native coronary artery without angina pectoris: Secondary | ICD-10-CM | POA: Diagnosis not present

## 2018-11-20 DIAGNOSIS — N390 Urinary tract infection, site not specified: Secondary | ICD-10-CM | POA: Diagnosis not present

## 2018-11-20 DIAGNOSIS — E1140 Type 2 diabetes mellitus with diabetic neuropathy, unspecified: Secondary | ICD-10-CM | POA: Diagnosis not present

## 2018-11-20 DIAGNOSIS — K7581 Nonalcoholic steatohepatitis (NASH): Secondary | ICD-10-CM | POA: Diagnosis not present

## 2018-11-20 DIAGNOSIS — D6959 Other secondary thrombocytopenia: Secondary | ICD-10-CM | POA: Diagnosis not present

## 2018-11-20 DIAGNOSIS — K7460 Unspecified cirrhosis of liver: Secondary | ICD-10-CM | POA: Diagnosis not present

## 2018-11-24 DIAGNOSIS — N390 Urinary tract infection, site not specified: Secondary | ICD-10-CM | POA: Diagnosis not present

## 2018-11-24 DIAGNOSIS — K7460 Unspecified cirrhosis of liver: Secondary | ICD-10-CM | POA: Diagnosis not present

## 2018-11-24 DIAGNOSIS — D6959 Other secondary thrombocytopenia: Secondary | ICD-10-CM | POA: Diagnosis not present

## 2018-11-24 DIAGNOSIS — K7581 Nonalcoholic steatohepatitis (NASH): Secondary | ICD-10-CM | POA: Diagnosis not present

## 2018-11-24 DIAGNOSIS — I2510 Atherosclerotic heart disease of native coronary artery without angina pectoris: Secondary | ICD-10-CM | POA: Diagnosis not present

## 2018-11-24 DIAGNOSIS — E1140 Type 2 diabetes mellitus with diabetic neuropathy, unspecified: Secondary | ICD-10-CM | POA: Diagnosis not present

## 2018-11-28 DIAGNOSIS — D649 Anemia, unspecified: Secondary | ICD-10-CM | POA: Diagnosis not present

## 2018-11-28 DIAGNOSIS — K8020 Calculus of gallbladder without cholecystitis without obstruction: Secondary | ICD-10-CM | POA: Diagnosis not present

## 2018-11-28 DIAGNOSIS — K7460 Unspecified cirrhosis of liver: Secondary | ICD-10-CM | POA: Diagnosis not present

## 2018-12-01 ENCOUNTER — Telehealth: Payer: Self-pay | Admitting: Cardiology

## 2018-12-01 NOTE — Telephone Encounter (Signed)
Patient called and she has been having chest pain and she has not took any Nitro during these episodes that re lasting more than 76mins. I have scheduled her for 12/24/2018 with [facility], but please call her in the am, She was informed to go ER if she feels worse.

## 2018-12-02 DIAGNOSIS — K8020 Calculus of gallbladder without cholecystitis without obstruction: Secondary | ICD-10-CM | POA: Diagnosis not present

## 2018-12-02 DIAGNOSIS — K7460 Unspecified cirrhosis of liver: Secondary | ICD-10-CM | POA: Diagnosis not present

## 2018-12-02 DIAGNOSIS — R195 Other fecal abnormalities: Secondary | ICD-10-CM | POA: Diagnosis not present

## 2018-12-02 DIAGNOSIS — D649 Anemia, unspecified: Secondary | ICD-10-CM | POA: Diagnosis not present

## 2018-12-02 DIAGNOSIS — Z1212 Encounter for screening for malignant neoplasm of rectum: Secondary | ICD-10-CM | POA: Diagnosis not present

## 2018-12-02 NOTE — Telephone Encounter (Signed)
Patient states that she has not had any reoccurring episodes of chest pain since yesterday afternoon. Patient did not take any nitroglycerin as she states she forgot all about this medication. Patient did take aspirin during this time. Patient has no complaints at this time. She went to see [name] this morning due to cirrhosis of the liver and her blood pressure and heart rate were checked. She states "they were both fine." Advised patient to contact our office if she has another episode of chest pain and to keep her scheduled appointment with Dr. Bettina Gavia on 12/24/2018. Patient verbalized understanding. No further questions.

## 2018-12-23 NOTE — Progress Notes (Signed)
Cardiology Office Note:    Date:  12/24/2018   ID:  [name], DOB Dec 12, 1945, MRN [id number]  PCP:  Greig Right, MD  Cardiologist:  Shirlee More, MD    Referring MD: Greig Right, MD    ASSESSMENT:    1. Chronic atrial fibrillation   2. Chronic diastolic heart failure ([place])   3. Hypertensive heart disease with chronic diastolic congestive heart failure ([place])   4. Mild CAD   5. Chest pain in adult    PLAN:    In order of problems listed above:  1. Primary problem today is chest pain concerning but not specific for either angina or cholelithiasis.  She will undergo cardiac CTA to define if she has severe flow-limiting stenosis and if that is the case I would tend to be more in favor of optimizing medical therapy and referring for cardiac revascularization with her known liver disease and high risk of bleeding.  She does have nitroglycerin at home she will take it if needed check labs in her in the office for renal function prior to contrast and include a troponin.  EKG to be done prior to leaving my office 2. Stable rate controlled continue beta-blocker and she takes low-dose aspirin after left atrial occlusion 3. Stable compensated does not require loop diuretic 4. Stable blood pressure continue current treatment beta-blocker ARB diuretic and alpha-blocker 5. Medical therapy low-dose aspirin oral nitrates beta-blocker and a statin.   Next appointment: 6-week follow-up   Medication Adjustments/Labs and Tests Ordered: Current medicines are reviewed at length with the patient today.  Concerns regarding medicines are outlined above.  Orders Placed This Encounter  Procedures  . CT CORONARY MORPH W/CTA COR W/SCORE W/CA W/CM &/OR WO/CM  . CT CORONARY FRACTIONAL FLOW RESERVE DATA PREP  . CT CORONARY FRACTIONAL FLOW RESERVE FLUID ANALYSIS   No orders of the defined types were placed in this encounter.   Chief Complaint  Patient presents with  . Follow-up  . Atrial  Fibrillation    with watchman LAAO  . Congestive Heart Failure  . Hypertension    History of Present Illness:    [name] [name] is a 73 y.o. female with a hx of  chronic AF with watchman LAA device 2017, mild CAD ,cirrhosis, hypertension and heart failure  last seen 08/05/18. Compliance with diet, lifestyle and medications: Yes  She is seen by me today at her request after we began her interaction she tells me that she had epigastric pain about a month ago lasted a few minutes quite severe and was concerned she has had a heart attack.  She has a background history of mild nonobstructive CAD.  She also alerts me that recently she was noted to have cholelithiasis.  The episode was nonexertional relieved spontaneously no shortness of breath diaphoresis radiation nausea or vomiting.  After discussion of options will undergo cardiac CTA to define if she has severe obstructive CAD I will access recent labs from GI but I be hesitant to consider cardiac revascularization with cirrhosis.  She tells me that her platelet count is low but that her liver function is stable and she has had no bleeding on low-dose aspirin after watchman left atrial occlusion device.  Despite cirrhosis and heart failure she does not take a loop diuretic or spironolactone.  She has nitroglycerin at home but did not take it when she had the episode. Past Medical History:  Diagnosis Date  . Allergic rhinitis   . Anxiety   .  ANXIETY 12/26/2007   Qualifier: Diagnosis of  By: Ronnald Ramp CNA/MA, Janett Billow    . Arthritis   . Bronchitis   . CHF (congestive heart failure) ([place])   . CHF, MILD 12/26/2007   Qualifier: Diagnosis of  By: Ronnald Ramp CNA/MA, Janett Billow    . Chronic atrial fibrillation 04/24/2016   Watchman atrial appendage device placed at [facility] for clot pevention  . Chronic liver disease 03/15/2016  . Complication of anesthesia    low blood pressure once  . Depression   . Enlarged heart   . Essential hypertension 12/26/2007   Qualifier:  Diagnosis of  By: Ronnald Ramp CNA/MA, Janett Billow    . Fibromyalgia   . GERD (gastroesophageal reflux disease)   . HTN (hypertension)    on medication since age 61  . Hypercholesteremia   . Hyperlipidemia 02/29/2016  . Hypothyroidism 03/20/2016  . Idiopathic cirrhosis (HCC)    stage 4; sees [name] in [place]  . Mild CAD 02/29/2016  . Myocardial infarction [facility])    age 58  . Neuropathy, peripheral    lower extremities  . OSA (obstructive sleep apnea)   . OSA on CPAP 07/04/2008   CPAP [facility]/ [facility]   . Seasonal and perennial allergic rhinitis 01/23/2011   Allergy vaccine restarted at 1:50 08/02/2011 [place], [place] 2016   . Sleep apnea   . Somnolence   . SOMNOLENCE 12/26/2007   Annotation: excessive daytime Qualifier: Diagnosis of  By: Ronnald Ramp CNA/MA, Janett Billow    . Splenomegaly   . Type II or unspecified type diabetes mellitus without mention of complication, not stated as uncontrolled   . Urinary incontinence, nocturnal enuresis   . Urinary, incontinence, stress female    wears depends    Past Surgical History:  Procedure Laterality Date  . ABDOMINAL HYSTERECTOMY    . APPENDECTOMY    . BACK SURGERY    . CARPAL TUNNEL RELEASE     bilaterally  . CATARACT EXTRACTION W/ INTRAOCULAR LENS  IMPLANT, BILATERAL    . DILATION AND CURETTAGE OF UTERUS    . EYE SURGERY     cataract ext/ iol implants  . KNEE ARTHROSCOPY    . LUMBAR LAMINECTOMY  03/2011; 01/2012  . MOUTH SURGERY    . SPLENECTOMY    . TONSILLECTOMY AND ADENOIDECTOMY    . TOTAL ABDOMINAL HYSTERECTOMY      Current Medications: Current Meds  Medication Sig  . acarbose (PRECOSE) 100 MG tablet Take 100 mg by mouth 3 (three) times daily with meals.  Marland Kitchen alendronate (FOSAMAX) 70 MG tablet Take 70 mg by mouth once a week.   Marland Kitchen aspirin EC 81 MG tablet Take 1 tablet (81 mg total) by mouth daily.  Marland Kitchen buPROPion (WELLBUTRIN XL) 300 MG 24 hr tablet Take 300 mg by mouth daily.  . carvedilol (COREG) 25 MG tablet Take 25 mg by mouth 2 (two) times daily.    . CRESTOR 5 MG tablet Take 1 tablet (5 mg total) by mouth daily at 6 PM.  . diflunisal (DOLOBID) 500 MG TABS Take 1 tablet by mouth 2 (two) times daily.  . ergocalciferol (VITAMIN D2) 50000 UNITS capsule Take 50,000 Units by mouth once a week. Monday  . gabapentin (NEURONTIN) 600 MG tablet Take 600 mg by mouth 3 (three) times daily.   . irbesartan-hydrochlorothiazide (AVALIDE) 300-12.5 MG tablet Take 1 tablet by mouth daily.  . isosorbide mononitrate (IMDUR) 60 MG 24 hr tablet Take 60 mg by mouth daily.  Marland Kitchen levothyroxine (SYNTHROID, LEVOTHROID) 125 MCG tablet Take 125  mcg by mouth daily before breakfast.   . magnesium gluconate (MAGONATE) 500 MG tablet Take 500 mg by mouth once a week.  . metFORMIN (GLUCOPHAGE-XR) 500 MG 24 hr tablet Take 500 mg by mouth daily.  Marland Kitchen NITROSTAT 0.4 MG SL tablet Place 0.4 mg under the tongue every 5 (five) minutes as needed for chest pain.   Marland Kitchen omeprazole (PRILOSEC) 40 MG capsule Take 1 capsule by mouth 2 (two) times daily.  . potassium chloride SA (K-DUR,KLOR-CON) 20 MEQ tablet Take 40 mEq by mouth 3 (three) times daily.   . TOVIAZ 4 MG TB24 tablet Take 1 tablet by mouth daily.  . traMADol (ULTRAM) 50 MG tablet Take 50 mg by mouth every 6 (six) hours as needed. For pain  . trimethoprim (TRIMPEX) 100 MG tablet Take 100 mg by mouth daily.  Marland Kitchen venlafaxine XR (EFFEXOR-XR) 150 MG 24 hr capsule Take 150 mg by mouth daily with breakfast.      Allergies:   Azithromycin; Cefuroxime axetil; Celecoxib; Codeine; Sulfa antibiotics; Hydralazine hcl; and Norvasc [amlodipine besylate]   Social History   Socioeconomic History  . Marital status: Married    Spouse name: Not on file  . Number of children: Not on file  . Years of education: Not on file  . Highest education level: Not on file  Occupational History  . Not on file  Social Needs  . Financial resource strain: Not on file  . Food insecurity:    Worry: Not on file    Inability: Not on file  . Transportation needs:     Medical: Not on file    Non-medical: Not on file  Tobacco Use  . Smoking status: Never Smoker  . Smokeless tobacco: Never Used  Substance and Sexual Activity  . Alcohol use: No  . Drug use: No  . Sexual activity: Not on file  Lifestyle  . Physical activity:    Days per week: Not on file    Minutes per session: Not on file  . Stress: Not on file  Relationships  . Social connections:    Talks on phone: Not on file    Gets together: Not on file    Attends religious service: Not on file    Active member of club or organization: Not on file    Attends meetings of clubs or organizations: Not on file    Relationship status: Not on file  Other Topics Concern  . Not on file  Social History Narrative  . Not on file     Family History: The patient's family history includes Anesthesia problems in her mother; CAD in her father; Cancer in her mother; Diabetes in her paternal grandmother; Emphysema in her mother; Heart attack in her father; Hypertension in her father; Rheum arthritis in her mother; Stroke in her father. ROS:   Please see the history of present illness.    All other systems reviewed and are negative.  EKGs/Labs/Other Studies Reviewed:    The following studies were reviewed today:  EKG:  EKG ordered today.  The ekg ordered today demonstrates atrial fibrillation old anterior septal MI unchanged from her previous EKGs  Recent Labs: No results found for requested labs within last 8760 hours.  Recent Lipid Panel No results found for: CHOL, TRIG, HDL, CHOLHDL, VLDL, LDLCALC, LDLDIRECT  Physical Exam:    VS:  BP 140/70 (BP Location: Right Arm, Patient Position: Sitting, Cuff Size: Large)   Pulse 87   Ht 5\' 3"  (1.6 m)   Wt  214 lb 2 oz (97.1 kg)   SpO2 96%   BMI 37.93 kg/m     Wt Readings from Last 3 Encounters:  12/24/18 214 lb 2 oz (97.1 kg)  09/25/18 213 lb 3.2 oz (96.7 kg)  08/05/18 215 lb 6.4 oz (97.7 kg)     GEN: She is alert does not look chronically  ill no jaundice well nourished, well developed in no acute distress HEENT: Normal NECK: No JVD; No carotid bruits LYMPHATICS: No lymphadenopathy CARDIAC: RRR, no murmurs, rubs, gallops RESPIRATORY:  Clear to auscultation without rales, wheezing or rhonchi  ABDOMEN: Soft, non-tender, non-distended MUSCULOSKELETAL:  No edema; No deformity  SKIN: Warm and dry NEUROLOGIC:  Alert and oriented x 3 PSYCHIATRIC:  Normal affect    Signed, Shirlee More, MD  12/24/2018 11:10 AM    [place]

## 2018-12-24 ENCOUNTER — Encounter: Payer: Self-pay | Admitting: Cardiology

## 2018-12-24 ENCOUNTER — Ambulatory Visit (INDEPENDENT_AMBULATORY_CARE_PROVIDER_SITE_OTHER): Payer: Medicare Other | Admitting: Cardiology

## 2018-12-24 VITALS — BP 140/70 | HR 87 | Ht 63.0 in | Wt 214.1 lb

## 2018-12-24 DIAGNOSIS — I482 Chronic atrial fibrillation, unspecified: Secondary | ICD-10-CM | POA: Diagnosis not present

## 2018-12-24 DIAGNOSIS — I110 Hypertensive heart disease with heart failure: Secondary | ICD-10-CM

## 2018-12-24 DIAGNOSIS — I2510 Atherosclerotic heart disease of native coronary artery without angina pectoris: Secondary | ICD-10-CM

## 2018-12-24 DIAGNOSIS — I5032 Chronic diastolic (congestive) heart failure: Secondary | ICD-10-CM

## 2018-12-24 DIAGNOSIS — R079 Chest pain, unspecified: Secondary | ICD-10-CM

## 2018-12-24 HISTORY — DX: Chest pain, unspecified: R07.9

## 2018-12-24 NOTE — Patient Instructions (Signed)
Medication Instructions:   Your physician recommends that you continue on your current medications as directed. Please refer to the Current Medication list given to you today.  If you need a refill on your cardiac medications before your next appointment, please call your pharmacy.   Lab work:  Your physician recommends that you return for lab work today: BMP, Troponin, Lipid,    If you have labs (blood work) drawn today and your tests are completely normal, you will receive your results only by: Marland Kitchen MyChart Message (if you have MyChart) OR . A paper copy in the mail If you have any lab test that is abnormal or we need to change your treatment, we will call you to review the results.  Testing/Procedures:  Please arrive at the [facility] main entrance of [facility] at xx:xx AM (30-45 minutes prior to test start time)  [facility] [place], [place] [address] [phone]  Proceed to the [facility] Radiology Department (First Floor).  Please follow these instructions carefully (unless otherwise directed):  Please return to the office 3-7 days before your scheduled CT to have a BMP drawn.   On the Night Before the Test: . Be sure to Drink plenty of water. . Do not consume any caffeinated/decaffeinated beverages or chocolate 12 hours prior to your test. . Do not take any antihistamines 12 hours prior to your test. . If you take Metformin do not take 24 hours prior to test.  On the Day of the Test: . Drink plenty of water. Do not drink any water within one hour of the test. . Do not eat any food 4 hours prior to the test. . You may take your regular medications prior to the test.   *For Clinical Staff only. Please instruct patient the following:*        -Drink plenty of water       After the Test: . Drink plenty of water. . After receiving IV contrast, you may experience a mild flushed feeling. This is normal. . On occasion, you may  experience a mild rash up to 24 hours after the test. This is not dangerous. If this occurs, you can take Benadryl 25 mg and increase your fluid intake. . If you experience trouble breathing, this can be serious. If it is severe call 911 IMMEDIATELY. If it is mild, please call our office. . If you take any of these medications: Glipizide/Metformin, Avandament, Glucavance, please do not take 48 hours after completing test.   Follow-Up: At [facility], you and your health needs are our priority.  As part of our continuing mission to provide you with exceptional heart care, we have created designated Provider Care Teams.  These Care Teams include your primary Cardiologist (physician) and Advanced Practice Providers (APPs -  Physician Assistants and Nurse Practitioners) who all work together to provide you with the care you need, when you need it.  You will need a follow up appointment in 6 weeks.

## 2018-12-25 ENCOUNTER — Emergency Department (HOSPITAL_COMMUNITY)
Admission: EM | Admit: 2018-12-25 | Discharge: 2018-12-25 | Disposition: A | Discharge status: Home / Self Care | Payer: Medicare Other | Attending: Emergency Medicine | Admitting: Emergency Medicine

## 2018-12-25 ENCOUNTER — Encounter (HOSPITAL_COMMUNITY): Payer: Self-pay

## 2018-12-25 ENCOUNTER — Other Ambulatory Visit: Payer: Self-pay

## 2018-12-25 DIAGNOSIS — I4891 Unspecified atrial fibrillation: Secondary | ICD-10-CM | POA: Insufficient documentation

## 2018-12-25 DIAGNOSIS — Z7984 Long term (current) use of oral hypoglycemic drugs: Secondary | ICD-10-CM | POA: Insufficient documentation

## 2018-12-25 DIAGNOSIS — I2510 Atherosclerotic heart disease of native coronary artery without angina pectoris: Secondary | ICD-10-CM | POA: Diagnosis not present

## 2018-12-25 DIAGNOSIS — I110 Hypertensive heart disease with heart failure: Secondary | ICD-10-CM | POA: Insufficient documentation

## 2018-12-25 DIAGNOSIS — I252 Old myocardial infarction: Secondary | ICD-10-CM | POA: Insufficient documentation

## 2018-12-25 DIAGNOSIS — E039 Hypothyroidism, unspecified: Secondary | ICD-10-CM | POA: Diagnosis not present

## 2018-12-25 DIAGNOSIS — I509 Heart failure, unspecified: Secondary | ICD-10-CM | POA: Diagnosis not present

## 2018-12-25 DIAGNOSIS — Z7982 Long term (current) use of aspirin: Secondary | ICD-10-CM | POA: Insufficient documentation

## 2018-12-25 DIAGNOSIS — R778 Other specified abnormalities of plasma proteins: Secondary | ICD-10-CM | POA: Diagnosis present

## 2018-12-25 DIAGNOSIS — R109 Unspecified abdominal pain: Secondary | ICD-10-CM | POA: Diagnosis not present

## 2018-12-25 DIAGNOSIS — Z79899 Other long term (current) drug therapy: Secondary | ICD-10-CM | POA: Insufficient documentation

## 2018-12-25 DIAGNOSIS — R001 Bradycardia, unspecified: Secondary | ICD-10-CM | POA: Diagnosis not present

## 2018-12-25 LAB — BASIC METABOLIC PANEL
ANION GAP: 13 (ref 5–15)
BUN / CREAT RATIO: 20 (ref 12–28)
BUN: 20 mg/dL (ref 8–27)
BUN: 19 mg/dL (ref 8–23)
CHLORIDE: 100 mmol/L (ref 96–106)
CHLORIDE: 100 mmol/L (ref 98–111)
CO2: 21 mmol/L (ref 20–29)
CO2: 23 mmol/L (ref 22–32)
CREATININE: 1.07 mg/dL — AB (ref 0.44–1.00)
Calcium: 9.7 mg/dL (ref 8.7–10.3)
Calcium: 9.5 mg/dL (ref 8.9–10.3)
Creatinine, Ser: 1 mg/dL (ref 0.57–1.00)
GFR calc Af Amer: 65 mL/min/{1.73_m2} (ref >59)
GFR calc non Af Amer: 56 mL/min/{1.73_m2} — ABNORMAL LOW (ref >59)
GFR calc non Af Amer: 52 mL/min — ABNORMAL LOW (ref >60)
GLUCOSE: 264 mg/dL — AB (ref 65–99)
Glucose, Bld: 260 mg/dL — ABNORMAL HIGH (ref 70–99)
POTASSIUM: 4.8 mmol/L (ref 3.5–5.2)
Potassium: 4.3 mmol/L (ref 3.5–5.1)
SODIUM: 136 mmol/L (ref 134–144)
SODIUM: 136 mmol/L (ref 135–145)

## 2018-12-25 LAB — LIPID PANEL
CHOL/HDL RATIO: 3 ratio (ref 0.0–4.4)
Cholesterol, Total: 117 mg/dL (ref 100–199)
HDL: 39 mg/dL — AB (ref >39)
LDL Calculated: 48 mg/dL (ref 0–99)
Triglycerides: 151 mg/dL — ABNORMAL HIGH (ref 0–149)
VLDL Cholesterol Cal: 30 mg/dL (ref 5–40)

## 2018-12-25 LAB — CBC
HCT: 38.3 % (ref 36.0–46.0)
HEMOGLOBIN: 11.8 g/dL — AB (ref 12.0–15.0)
MCH: 27.4 pg (ref 26.0–34.0)
MCHC: 30.8 g/dL (ref 30.0–36.0)
MCV: 89.1 fL (ref 80.0–100.0)
NRBC: 0 % (ref 0.0–0.2)
Platelets: 98 10*3/uL — ABNORMAL LOW (ref 150–400)
RBC: 4.3 MIL/uL (ref 3.87–5.11)
RDW: 14.7 % (ref 11.5–15.5)
WBC: 5.2 10*3/uL (ref 4.0–10.5)

## 2018-12-25 LAB — TROPONIN I: Troponin I: 0.31 ng/mL (ref 0.00–0.04)

## 2018-12-25 NOTE — Discharge Instructions (Addendum)
Follow-up with your doctor as needed.  Return here for any problems

## 2018-12-25 NOTE — ED Triage Notes (Signed)
Pr arrives from home with complaints of abdominal pain (which she thinks is from gallstones and her liver cirrhosis), also states she was sent by her PCP to "make sure you're not having a heart attack because your troponin was elevated". Pt states she has had a heart attack 20 years ago, but no surgical interventions were performed. Pt in NAD during triage, husband bedside.

## 2018-12-25 NOTE — ED Notes (Signed)
Patient verbalizes understanding of discharge instructions. Opportunity for questioning and answers were provided. Armband removed by staff, pt discharged from ED.  

## 2018-12-25 NOTE — ED Provider Notes (Signed)
[facility] EMERGENCY DEPARTMENT Provider Note   CSN: [id number] Arrival date & time: 12/25/18  1057     History   Chief Complaint Chief Complaint  Patient presents with  . Abdominal Pain  . Referral    HPI [name] [name] is a 73 y.o. female.  73 year old female who presents after being told that her troponin was elevated by her physician.  Was seen yesterday and had blood work performed at that time.  Had chest pain several weeks ago but denies any current chest pain.  She does not have any shortness of breath, dyspnea, diaphoresis.  No cough or congestion.  States she feels at her baseline at this time.  Was told to come in for a repeat value.  Of note, patient chronically is in A. fib.     Past Medical History:  Diagnosis Date  . Allergic rhinitis   . Anxiety   . ANXIETY 12/26/2007   Qualifier: Diagnosis of  By: Ronnald Ramp CNA/MA, Janett Billow    . Arthritis   . Bronchitis   . CHF (congestive heart failure) ([place])   . CHF, MILD 12/26/2007   Qualifier: Diagnosis of  By: Ronnald Ramp CNA/MA, Janett Billow    . Chronic atrial fibrillation 04/24/2016   Watchman atrial appendage device placed at [facility] for clot pevention  . Chronic liver disease 03/15/2016  . Complication of anesthesia    low blood pressure once  . Depression   . Enlarged heart   . Essential hypertension 12/26/2007   Qualifier: Diagnosis of  By: Ronnald Ramp CNA/MA, Janett Billow    . Fibromyalgia   . GERD (gastroesophageal reflux disease)   . HTN (hypertension)    on medication since age 75  . Hypercholesteremia   . Hyperlipidemia 02/29/2016  . Hypothyroidism 03/20/2016  . Idiopathic cirrhosis (HCC)    stage 4; sees [name] in [place]  . Mild CAD 02/29/2016  . Myocardial infarction [facility])    age 48  . Neuropathy, peripheral    lower extremities  . OSA (obstructive sleep apnea)   . OSA on CPAP 07/04/2008   CPAP [facility]/ [facility]   . Seasonal and perennial allergic rhinitis 01/23/2011   Allergy vaccine restarted at 1:50  08/02/2011 [place], [place] 2016   . Sleep apnea   . Somnolence   . SOMNOLENCE 12/26/2007   Annotation: excessive daytime Qualifier: Diagnosis of  By: Ronnald Ramp CNA/MA, Janett Billow    . Splenomegaly   . Type II or unspecified type diabetes mellitus without mention of complication, not stated as uncontrolled   . Urinary incontinence, nocturnal enuresis   . Urinary, incontinence, stress female    wears depends    Patient Active Problem List   Diagnosis Date Noted  . Chest pain in adult 12/24/2018  . Swelling of joint, knee, right 01/30/2018  . Presence of Watchman left atrial appendage closure device 01/30/2018  . Esophageal reflux 07/04/2017  . Urinary, incontinence, stress female   . Urinary incontinence, nocturnal enuresis   . Type 2 diabetes mellitus without complications ([place])   . Splenomegaly   . Somnolence   . OSA (obstructive sleep apnea)   . Neuropathy, peripheral   . Myocardial infarction ([place])   . Idiopathic cirrhosis ([place])   . Hypercholesteremia   . Hypertensive heart disease   . GERD (gastroesophageal reflux disease)   . Fibromyalgia   . Enlarged heart   . Complication of anesthesia   . Chronic diastolic heart failure ([place])   . Arthritis   . Anxiety   .  Chronic atrial fibrillation 04/24/2016  . Hypothyroidism 03/20/2016  . Chronic liver disease 03/15/2016  . Hyperlipidemia 02/29/2016  . Mild CAD 02/29/2016  . Seasonal and perennial allergic rhinitis 01/23/2011  . Type 2 diabetes mellitus ([place]) 12/29/2007  . ANXIETY 12/26/2007  . Depression 12/26/2007  . Essential hypertension 12/26/2007  . CHF, MILD 12/26/2007  . Bronchitis 12/26/2007  . SOMNOLENCE 12/26/2007    Past Surgical History:  Procedure Laterality Date  . ABDOMINAL HYSTERECTOMY    . APPENDECTOMY    . BACK SURGERY    . CARPAL TUNNEL RELEASE     bilaterally  . CATARACT EXTRACTION W/ INTRAOCULAR LENS  IMPLANT, BILATERAL    . DILATION AND CURETTAGE OF UTERUS    . EYE SURGERY     cataract ext/ iol implants    . KNEE ARTHROSCOPY    . LUMBAR LAMINECTOMY  03/2011; 01/2012  . MOUTH SURGERY    . SPLENECTOMY    . TONSILLECTOMY AND ADENOIDECTOMY    . TOTAL ABDOMINAL HYSTERECTOMY       OB History   No obstetric history on file.      Home Medications    Prior to Admission medications   Medication Sig Start Date End Date Taking? Authorizing Provider  acarbose (PRECOSE) 100 MG tablet Take 100 mg by mouth 3 (three) times daily with meals.    [provider]  alendronate (FOSAMAX) 70 MG tablet Take 70 mg by mouth once a week.  04/14/14   [provider]  aspirin EC 81 MG tablet Take 1 tablet (81 mg total) by mouth daily. 07/05/17   Richardo Priest, MD  buPROPion (WELLBUTRIN XL) 300 MG 24 hr tablet Take 300 mg by mouth daily.    [provider]  carvedilol (COREG) 25 MG tablet Take 25 mg by mouth 2 (two) times daily. 06/25/17   [provider]  CRESTOR 5 MG tablet Take 1 tablet (5 mg total) by mouth daily at 6 PM. 09/09/18   Munley, Hilton Cork, MD  diflunisal (DOLOBID) 500 MG TABS Take 1 tablet by mouth 2 (two) times daily. 01/05/13   [provider]  doxazosin (CARDURA) 4 MG tablet Take 1 tablet (4 mg total) by mouth at bedtime. Patient not taking: Reported on 12/24/2018 10/29/18 10/29/19  Richardo Priest, MD  ergocalciferol (VITAMIN D2) 50000 UNITS capsule Take 50,000 Units by mouth once a week. Monday    [provider]  gabapentin (NEURONTIN) 600 MG tablet Take 600 mg by mouth 3 (three) times daily.     [provider]  irbesartan-hydrochlorothiazide (AVALIDE) 300-12.5 MG tablet Take 1 tablet by mouth daily. 06/27/17   [provider]  isosorbide mononitrate (IMDUR) 60 MG 24 hr tablet Take 60 mg by mouth daily.    [provider]  levothyroxine (SYNTHROID, LEVOTHROID) 125 MCG tablet Take 125 mcg by mouth daily before breakfast.  12/06/14   [provider]  magnesium gluconate (MAGONATE) 500 MG tablet Take 500 mg by mouth  once a week.    [provider]  metFORMIN (GLUCOPHAGE-XR) 500 MG 24 hr tablet Take 500 mg by mouth daily. 06/20/18   [provider]  NITROSTAT 0.4 MG SL tablet Place 0.4 mg under the tongue every 5 (five) minutes as needed for chest pain.  12/16/14   [provider]  omeprazole (PRILOSEC) 40 MG capsule Take 1 capsule by mouth 2 (two) times daily. 01/12/13   [provider]  potassium chloride SA (K-DUR,KLOR-CON) 20 MEQ tablet Take  40 mEq by mouth 3 (three) times daily.     [provider]  TOVIAZ 4 MG TB24 tablet Take 1 tablet by mouth daily. 09/08/18   [provider]  traMADol (ULTRAM) 50 MG tablet Take 50 mg by mouth every 6 (six) hours as needed. For pain    [provider]  trimethoprim (TRIMPEX) 100 MG tablet Take 100 mg by mouth daily.    [provider]  venlafaxine XR (EFFEXOR-XR) 150 MG 24 hr capsule Take 150 mg by mouth daily with breakfast.  12/20/14   [provider]    Family History Family History  Problem Relation Age of Onset  . Emphysema Mother   . Rheum arthritis Mother   . Cancer Mother        uterine, cervical, vaginal  . Anesthesia problems Mother   . Heart attack Father   . Stroke Father   . CAD Father   . Hypertension Father   . Diabetes Paternal Grandmother     Social History Social History   Tobacco Use  . Smoking status: Never Smoker  . Smokeless tobacco: Never Used  Substance Use Topics  . Alcohol use: No  . Drug use: No     Allergies   Azithromycin; Cefuroxime axetil; Celecoxib; Codeine; Sulfa antibiotics; Hydralazine hcl; and Norvasc [amlodipine besylate]   Review of Systems Review of Systems  All other systems reviewed and are negative.    Physical Exam Updated Vital Signs BP (!) 161/119 (BP Location: Right Wrist)   Pulse 70   Temp 98.7 F (37.1 C)   Resp 19   Ht 1.6 m (5\' 3" )   Wt 97.1 kg   SpO2 97%   BMI 37.93 kg/m   Physical Exam Vitals signs and  nursing note reviewed.  Constitutional:      General: She is not in acute distress.    Appearance: Normal appearance. She is well-developed. She is not toxic-appearing.  HENT:     Head: Normocephalic and atraumatic.  Eyes:     General: Lids are normal.     Conjunctiva/sclera: Conjunctivae normal.     Pupils: Pupils are equal, round, and reactive to light.  Neck:     Musculoskeletal: Normal range of motion and neck supple.     Thyroid: No thyroid mass.     Trachea: No tracheal deviation.  Cardiovascular:     Rate and Rhythm: Bradycardia present. Rhythm irregular.     Heart sounds: Normal heart sounds. No murmur. No gallop.   Pulmonary:     Effort: Pulmonary effort is normal. No respiratory distress.     Breath sounds: Normal breath sounds. No stridor. No decreased breath sounds, wheezing, rhonchi or rales.  Abdominal:     General: Bowel sounds are normal. There is no distension.     Palpations: Abdomen is soft.     Tenderness: There is no abdominal tenderness. There is no rebound.  Musculoskeletal: Normal range of motion.        General: No tenderness.  Skin:    General: Skin is warm and dry.     Findings: No abrasion or rash.  Neurological:     Mental Status: She is alert and oriented to person, place, and time.     GCS: GCS eye subscore is 4. GCS verbal subscore is 5. GCS motor subscore is 6.     Cranial Nerves: No cranial nerve deficit.     Sensory: No sensory deficit.  Psychiatric:  Speech: Speech normal.        Behavior: Behavior normal.      ED Treatments / Results  Labs (all labs ordered are listed, but only abnormal results are displayed) Labs Reviewed  CBC  BASIC METABOLIC PANEL  TROPONIN I    EKG EKG Interpretation  Date/Time:  Thursday December 25 2018 11:06:59 EST Ventricular Rate:  75 PR Interval:    QRS Duration: 95 QT Interval:  442 QTC Calculation: 484 R Axis:   128 Text Interpretation:  Atrial fibrillation Right axis deviation Low  voltage, precordial leads Borderline ST depression, anterolateral leads Baseline wander in lead(s) II III aVR aVF V2 V4 No significant change since last tracing Confirmed by Lacretia Leigh ([id number]) on 12/25/2018 11:17:37 AM   Radiology No results found.  Procedures Procedures (including critical care time)  Medications Ordered in ED Medications - No data to display   Initial Impression / Assessment and Plan / ED Course  I have reviewed the triage vital signs and the nursing notes.  Pertinent labs & imaging results that were available during my care of the patient were reviewed by me and considered in my medical decision making (see chart for details).     Patient with negative troponin here.  She has had no symptoms of chest pain or chest pressure.  Suspect early result was lab error.  Will discharge home Final Clinical Impressions(s) / ED Diagnoses   Final diagnoses:  None    ED Discharge Orders    None       Lacretia Leigh, MD 12/25/18 1319

## 2019-01-08 ENCOUNTER — Telehealth: Payer: Self-pay

## 2019-01-08 DIAGNOSIS — I10 Essential (primary) hypertension: Secondary | ICD-10-CM

## 2019-01-08 DIAGNOSIS — Z01812 Encounter for preprocedural laboratory examination: Secondary | ICD-10-CM

## 2019-01-08 NOTE — Telephone Encounter (Signed)
Patient aware that she is scheduled for Cardiac CTA on 01-27-2019, and will need BMP 7 days prior to procedure. Patient advised to report to office on January 20, 2019 to have labs drawn.  Patient agreed to plan and verbalized understanding.

## 2019-01-09 DIAGNOSIS — E113293 Type 2 diabetes mellitus with mild nonproliferative diabetic retinopathy without macular edema, bilateral: Secondary | ICD-10-CM | POA: Diagnosis not present

## 2019-01-14 DIAGNOSIS — E1122 Type 2 diabetes mellitus with diabetic chronic kidney disease: Secondary | ICD-10-CM | POA: Diagnosis not present

## 2019-01-14 DIAGNOSIS — N3000 Acute cystitis without hematuria: Secondary | ICD-10-CM | POA: Diagnosis not present

## 2019-01-14 DIAGNOSIS — Z6839 Body mass index (BMI) 39.0-39.9, adult: Secondary | ICD-10-CM | POA: Diagnosis not present

## 2019-01-14 DIAGNOSIS — K8080 Other cholelithiasis without obstruction: Secondary | ICD-10-CM | POA: Diagnosis not present

## 2019-01-14 DIAGNOSIS — E1140 Type 2 diabetes mellitus with diabetic neuropathy, unspecified: Secondary | ICD-10-CM | POA: Diagnosis not present

## 2019-01-14 DIAGNOSIS — E7800 Pure hypercholesterolemia, unspecified: Secondary | ICD-10-CM | POA: Diagnosis not present

## 2019-01-14 DIAGNOSIS — E1121 Type 2 diabetes mellitus with diabetic nephropathy: Secondary | ICD-10-CM | POA: Diagnosis not present

## 2019-01-14 DIAGNOSIS — E113293 Type 2 diabetes mellitus with mild nonproliferative diabetic retinopathy without macular edema, bilateral: Secondary | ICD-10-CM | POA: Diagnosis not present

## 2019-01-14 DIAGNOSIS — B3789 Other sites of candidiasis: Secondary | ICD-10-CM | POA: Diagnosis not present

## 2019-01-15 ENCOUNTER — Encounter: Payer: Self-pay | Admitting: Cardiology

## 2019-01-20 ENCOUNTER — Telehealth: Payer: Self-pay | Admitting: Cardiology

## 2019-01-20 DIAGNOSIS — N3281 Overactive bladder: Secondary | ICD-10-CM | POA: Diagnosis not present

## 2019-01-20 DIAGNOSIS — Z01812 Encounter for preprocedural laboratory examination: Secondary | ICD-10-CM | POA: Diagnosis not present

## 2019-01-20 DIAGNOSIS — N3090 Cystitis, unspecified without hematuria: Secondary | ICD-10-CM | POA: Diagnosis not present

## 2019-01-20 DIAGNOSIS — I10 Essential (primary) hypertension: Secondary | ICD-10-CM | POA: Diagnosis not present

## 2019-01-20 LAB — BASIC METABOLIC PANEL
BUN/Creatinine Ratio: 17 (ref 12–28)
BUN: 15 mg/dL (ref 8–27)
CHLORIDE: 96 mmol/L (ref 96–106)
CO2: 20 mmol/L (ref 20–29)
Calcium: 9.8 mg/dL (ref 8.7–10.3)
Creatinine, Ser: 0.87 mg/dL (ref 0.57–1.00)
GFR calc Af Amer: 77 mL/min/{1.73_m2} (ref >59)
GFR calc non Af Amer: 67 mL/min/{1.73_m2} (ref >59)
GLUCOSE: 353 mg/dL — AB (ref 65–99)
POTASSIUM: 4.1 mmol/L (ref 3.5–5.2)
SODIUM: 133 mmol/L — AB (ref 134–144)

## 2019-01-20 NOTE — Addendum Note (Signed)
Addended by: Austin Miles on: 01/20/2019 04:21 PM   Modules accepted: Orders

## 2019-01-20 NOTE — Telephone Encounter (Signed)
Patient informed she can discontinue doxazosin at this time pre Dr. Bettina Gavia. Patient verbalized understanding. No further questions.

## 2019-01-20 NOTE — Telephone Encounter (Signed)
Patient reports that she has been having recurrent UTI's since August 2019. She is on an antibiotic now and Dr. Nila Nephew thinks doxazosin is "not helping this problem" according to the patient. Will have Dr. Bettina Gavia advise.

## 2019-01-20 NOTE — Telephone Encounter (Signed)
Dr Nila Nephew wants her to go off doxazosin

## 2019-01-20 NOTE — Telephone Encounter (Signed)
I am fairly certain I did not prescribe doxazosin she can discontinue it from my perspective

## 2019-01-21 ENCOUNTER — Encounter: Payer: Self-pay | Admitting: Internal Medicine

## 2019-01-26 ENCOUNTER — Telehealth (HOSPITAL_COMMUNITY): Payer: Self-pay | Admitting: Emergency Medicine

## 2019-01-26 ENCOUNTER — Encounter: Payer: Self-pay | Admitting: Internal Medicine

## 2019-01-26 ENCOUNTER — Ambulatory Visit (INDEPENDENT_AMBULATORY_CARE_PROVIDER_SITE_OTHER): Payer: Medicare Other | Admitting: Internal Medicine

## 2019-01-26 DIAGNOSIS — G4733 Obstructive sleep apnea (adult) (pediatric): Secondary | ICD-10-CM

## 2019-01-26 DIAGNOSIS — I2510 Atherosclerotic heart disease of native coronary artery without angina pectoris: Secondary | ICD-10-CM | POA: Diagnosis not present

## 2019-01-26 DIAGNOSIS — J40 Bronchitis, not specified as acute or chronic: Secondary | ICD-10-CM | POA: Diagnosis not present

## 2019-01-26 NOTE — Patient Instructions (Signed)
Its important for you to have more self-discipline about going to bed at a regular time and putting your CPAP on every night. That is much better for your heart.  I hope you get good results from your heart tests tomorrow.   Please call if we can help

## 2019-01-26 NOTE — Assessment & Plan Note (Signed)
She benefits from CPAP when she will use it.  Undisciplined/irregular sleep schedule.  Falls asleep in the evening in her chair without bothering to get up to bed.  I emphasized the importance of regular sleep schedule, more physical activity during the day, and regular use of CPAP all night every night. Plan-continue CPAP auto 5-15

## 2019-01-26 NOTE — Progress Notes (Signed)
HPI F never smoker, followed for OSA, chronic bronchitis, allergic rhinitis, complicated by nonalcoholic cirrhosis/[name] GI,   A. Fib/"Watchman" clot- preventing device placed in her atrial appendage , HBP, MI, chronic diastolic CHF, GERD, DM 2, hypothyroid NPSG 11/05/01-AHI 93/hour, desaturation to 63%, body weight 217 pounds PFT 07/26/09-mild restriction and reduction of diffusion. No obstruction, no response to dilator ----------------------------------------------------------------------------------- 09/25/2018- 73 year old female never smoker followed for OSA, chronic bronchitis, allergic rhinitis, complicated by nonalcoholic cirrhosis/[name] GI,  AFib/"Watchman" clot- preventing device placed in her atrial appendage, HBP, MI, chronic diastolic CHF, GERD, DM 2, hypothyroid CPAP auto 5-15/APS>> today replace machine and change to auto 5-20 -----OSA; DME [facility]. Pt tries to wear CPAP nightly for at least 4 hours. DL attached.  Was hospitalized recently at [facility] with urinary infection/sepsis/ICU and is now on Levaquin. Download 33% compliance AHI 9.5/hour.  Machine is much more than 73 years old and not functioning correctly.  She admits she is casual about bedtime and often falls asleep in chair, then husband has trouble arousing her.  She asks if she might have narcolepsy.  She has had a few episodes that sound like sleep paralysis and has frequent very vivid dreams/nightmares.  "I cannot sleep without my CPAP". Breathing has been comfortable.  Cardiology follows her closely.  01/25/2019- 73 year old female never smoker followed for OSA, chronic bronchitis, allergic rhinitis, complicated by nonalcoholic cirrhosis/[name] GI,  AFib/"Watchman" clot- preventing device placed in her atrial appendage, HBP, MI, chronic diastolic CHF, GERD, DM 2, hypothyroid  CPAP auto 5-20/[employer] Download compliance 27% AHI 9.5/hour Body weight today 211 lbs She sits in her recliner- rocker much of the day,  dozing on and off. Husband goes to bed early evening and leaves her. She notes she sleeps better with CPAP, but often falls asleep in chair and never puts it on.  Breathing is comfortable, off inhalers and without cough or wheeze. Limiting DOE is related to obesity, arthritis, cardiac status and total lack of exercise.  Being evaluated for  Chest pains with Cardiac CT tomorrow.   ROS-see HPI  + = positive Constitutional:   No-   weight loss, night sweats, fevers, chills, + fatigue, lassitude. HEENT:   No-  headaches, difficulty swallowing, tooth/dental problems, sore throat,       No-  sneezing, itching, ear ache, nasal congestion, post nasal drip,  CV:  No-   chest pain, orthopnea, PND, swelling in lower extremities, anasarca, dizziness, palpitations Resp:+ shortness of breath with exertion or at rest.             productive cough,  No non-productive cough,  No- coughing up of blood.              No-   change in color of mucus.  No- wheezing.   Skin: No-   rash or lesions. GI:  No-   heartburn, indigestion, abdominal pain, nausea, vomiting,  GU:  MS:  + joint pain or swelling. + back pain. Neuro-     nothing unusual Psych:  No- change in mood or affect. No depression or anxiety.  No memory loss.  OBJ- Physical Exam General- Alert, Oriented, Affect-appropriate, Distress- none acute,  +morbid obesity Skin-  + ecchymoses on arms Lymphadenopathy- none Head- atraumatic            Eyes- Gross vision intact, PERRLA, conjunctivae and secretions clear            Ears- Hearing, canals-normal  Nose- Clear, no-Septal dev, mucus, polyps, erosion, perforation             Throat- Mallampati II-III , mucosa clear , drainage- none, tonsils- atrophic Neck- flexible , trachea midline, no stridor , thyroid nl, carotid no bruit Chest - symmetrical excursion , unlabored           Heart/CV- IRR/Afib , no murmur , no gallop  , no rub, nl s1 s2                           - JVD- none , edema- none,  stasis changes +, varices- none           Lung- clear to P&A, wheeze- none, cough- none , dullness-none, rub- none           Chest wall-  Abd-  Br/ Gen/ Rectal- Not done, not indicated Extrem- cyanosis- none, clubbing, none, atrophy- none, strength- nl. + Rolling walker. +ankle brace Neuro- grossly intact to observation

## 2019-01-26 NOTE — Assessment & Plan Note (Signed)
Chronic bronchitis, mild intermittent uncomplicated.  She has been doing very well without need for inhalers and no recent acute event.

## 2019-01-26 NOTE — Telephone Encounter (Signed)
Reaching out to patient to offer assistance regarding upcoming cardiac imaging study; pt verbalizes understanding of appt date/time, parking situation and where to check in, pre-test NPO status and medications ordered, and verified current allergies; name and call back number provided for further questions should they arise Karis Rilling RN Navigator Cardiac Imaging [facility] [phone] office [phone] cell 

## 2019-01-27 ENCOUNTER — Ambulatory Visit (HOSPITAL_COMMUNITY)
Admission: RE | Admit: 2019-01-27 | Discharge: 2019-01-27 | Disposition: A | Discharge status: Home / Self Care | Payer: Medicare Other | Source: Ambulatory Visit | Attending: Cardiology | Admitting: Cardiology

## 2019-01-27 ENCOUNTER — Encounter (HOSPITAL_COMMUNITY): Payer: Self-pay

## 2019-01-27 DIAGNOSIS — R079 Chest pain, unspecified: Secondary | ICD-10-CM | POA: Diagnosis not present

## 2019-01-27 DIAGNOSIS — I2510 Atherosclerotic heart disease of native coronary artery without angina pectoris: Secondary | ICD-10-CM | POA: Diagnosis not present

## 2019-01-27 IMAGING — CT CT HEAR MORPH WITH CTA COR WITH SCORE WITH CA WITH CONTRAST AND
2 of 8 series · 10 of 20 positions shown, 12 images · IV contrast (omnipaque)
Comparison: None.
COMPARISON: None.

Addendum:
EXAM:
OVER-READ INTERPRETATION  CT CHEST

The following report is an over-read performed by radiologist Dr.
Noris Regis [[email or web address]] on 01/27/2019. This
over-read does not include interpretation of cardiac or coronary
anatomy or pathology. The coronary calcium score/coronary CTA
interpretation by the cardiologist is attached.
HISTORY: angina
Cardiac/Coronary  CT
TECHNIQUE: The patient was scanned on a Siemens Force scanner.
PROTOCOL: A 120 kV prospective scan was triggered in the descending thoracic
aorta at 111 HU's. Axial non-contrast 3 mm slices were carried out
through the heart. The data set was analyzed on a dedicated work
station and scored using the Agatson method. Gantry rotation speed
was 250 msecs and collimation was .6 mm. Beta blockade and 0.8 mg of
sl NTG was given. The 3D data set was reconstructed in 5% intervals
of the entire cardiac cycle due to atrial fibrillation. Diastolic
phases were analyzed on a dedicated work station using MPR, MIP and
VRT modes. The patient received 80mL OMNIPAQUE IOHEXOL 350 MG/ML
SOLN of contrast.

[Series 9: 0-90% · axial · 0.46mm/px · z∈[+1107,+1201]mm · 5 of 2360 slices shown, 7 images]
[im 394/2360  vessel]
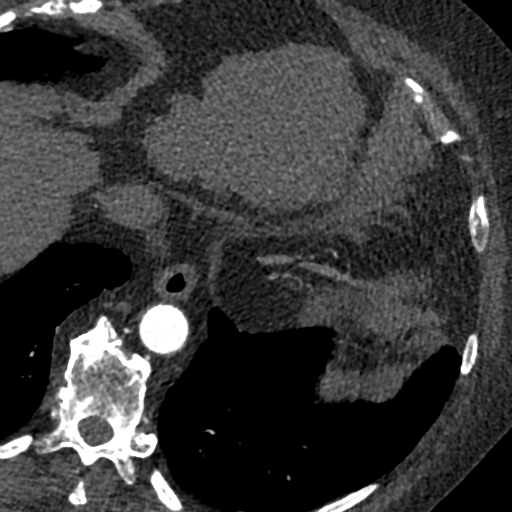
[im 394/2360  lung]
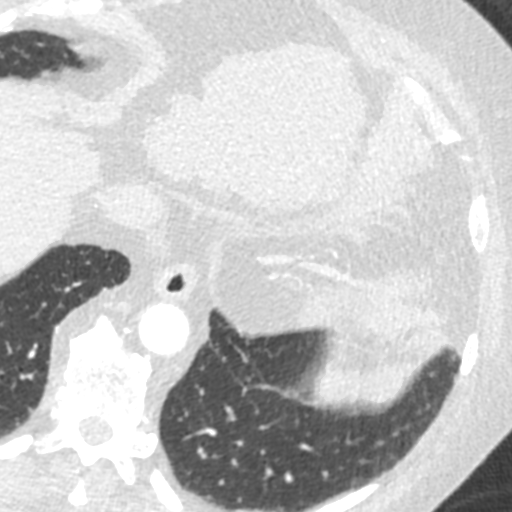
[im 787/2360  vessel]
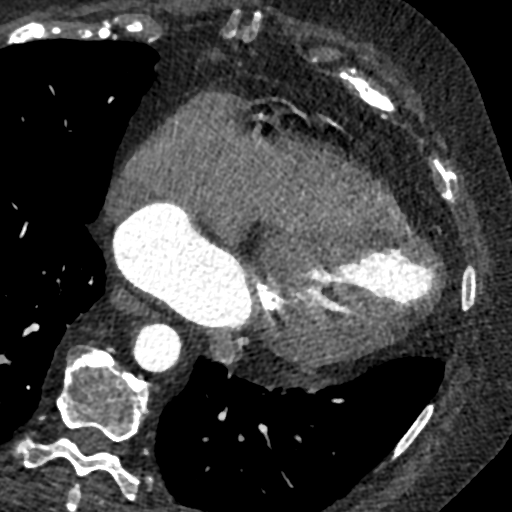
[im 1180/2360  vessel]
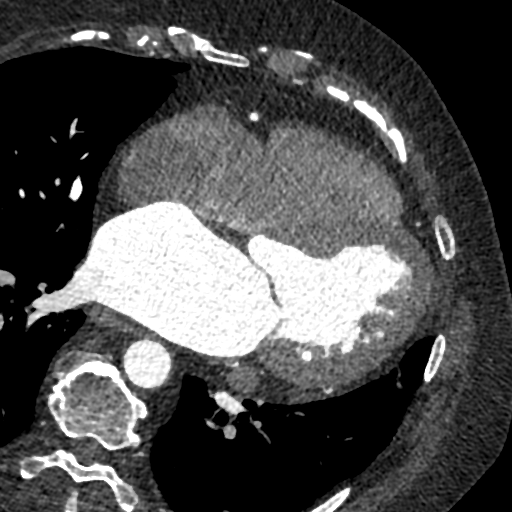
[im 1573/2360  vessel]
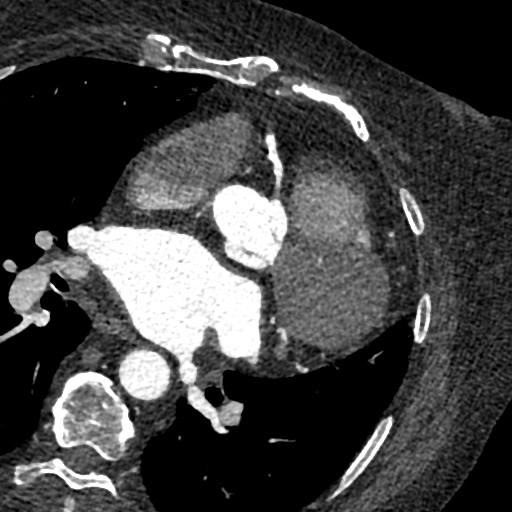
[im 1966/2360  vessel]
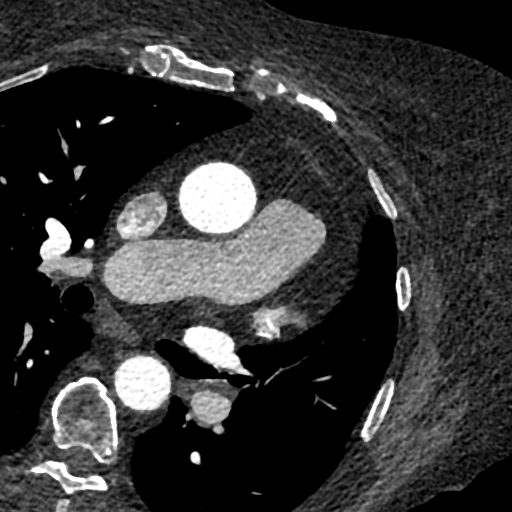
[im 1966/2360  lung]
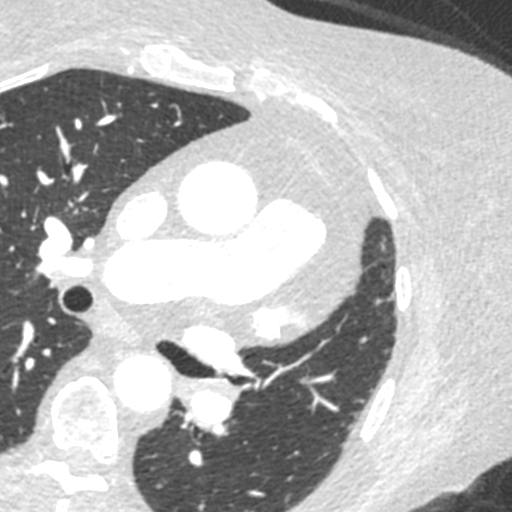

[Series 10: 5-95% · axial · 0.46mm/px · z∈[+1107,+1201]mm · 5 of 2360 slices shown]
[im 394/2360  vessel]
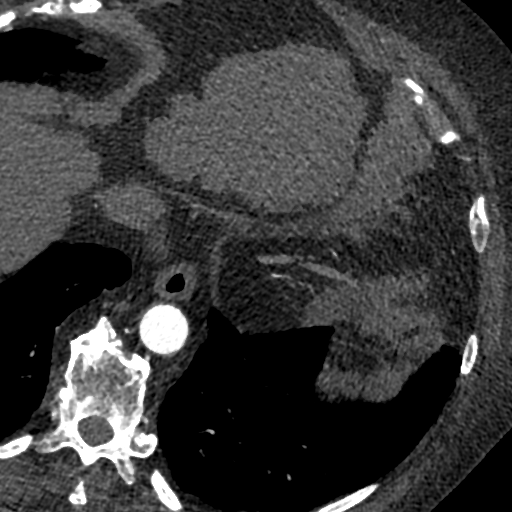
[im 787/2360  vessel]
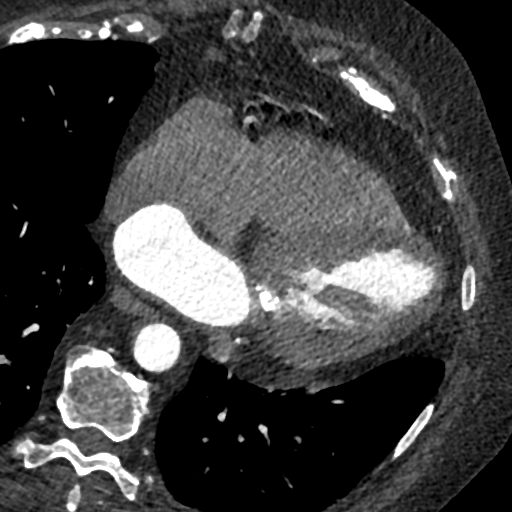
[im 1180/2360  vessel]
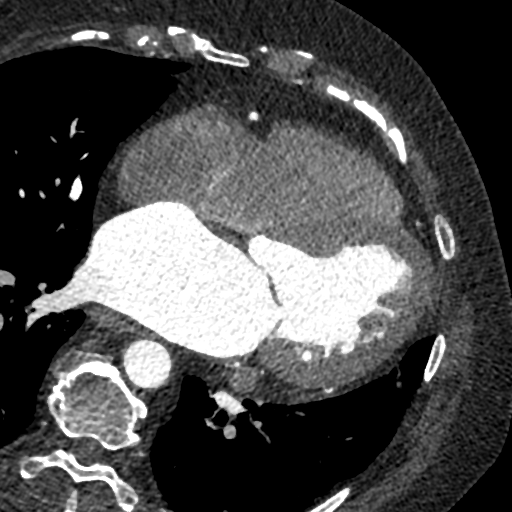
[im 1573/2360  vessel]
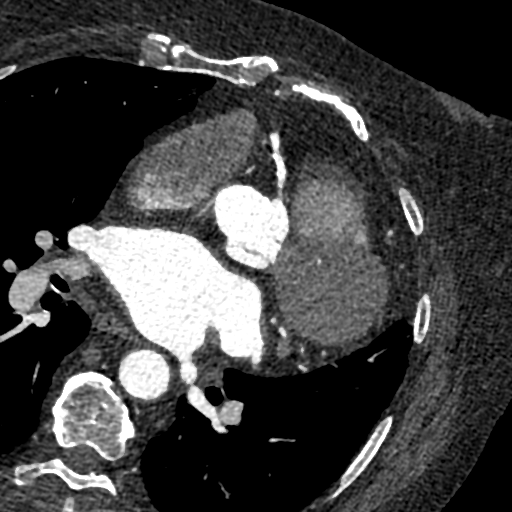
[im 1966/2360  vessel]
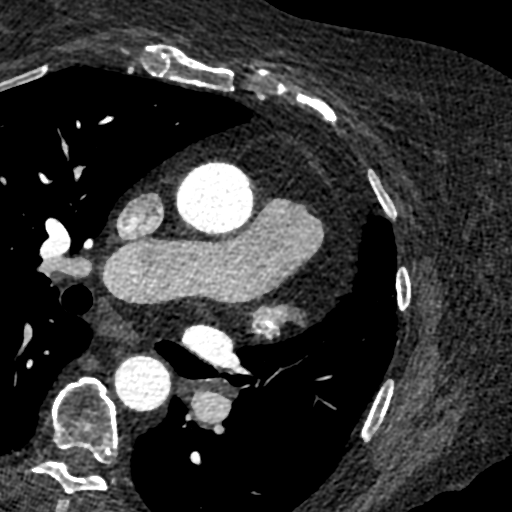

[10 of 20 positions shown; findings below may reference images not displayed]

FINDINGS: Aortic atherosclerosis. Within the visualized portions of the thorax
there are no suspicious appearing pulmonary nodules or masses, there
is no acute consolidative airspace disease, no pleural effusions, no
pneumothorax and no lymphadenopathy. Visualized portions of the
upper abdomen demonstrates a shrunken appearance and nodular contour
of the liver, indicative of advanced cirrhosis. There are no
aggressive appearing lytic or blastic lesions noted in the
visualized portions of the skeleton.
IMPRESSION: 1.  Aortic Atherosclerosis (80DZQ-WB8.8).
2. Cirrhosis.
FINDINGS: Coronary calcium score: The patient's coronary artery calcium score
is 240, which places the patient in the 81 percentile.

Coronary arteries: Normal coronary origins.  Right dominance.

Right Coronary Artery: Mild mixed atherosclerotic plaque in the
proximal RCA, 25-49% stenosis.

Left Main Coronary Artery: Minimal mixed atherosclerotic plaque in
the LM, <25% stenosis.

Left Anterior Descending Coronary Artery: Mild scattered mixed
atherosclerotic plaque in the LAD, 25-49% stenosis.

Ramus intermedius: Small caliber ramus intermedius branch is patent.

Left Circumflex Artery: Possible mild atherosclerotic plaque the
trifurcation of the first and second OM and the distal circumflex
artery, 25-49% stenosis.

Aorta:  Normal size.  No calcifications.  No dissection.

Aortic Valve:  Trileaflet.  Mild annular calcifications.

Other findings:

Severe left atrial enlargement.

Normal pulmonary vein drainage into the left atrium.

Left atrial appendage occluder device in place. Contrast enters the
LA appendage, suggesting incomplete occlusion of the LA appendage.

Normal size of the pulmonary artery.

Severe mitral annular calcification.

Patent foramen ovale vs. iatrogenic atrial septal left to right
shunt s/p left atrial trans-septal puncture for placement of LA
appendage occluder.
IMPRESSION: 1. The patient's coronary artery calcium score is 240, which places
the patient in the 81 percentile.

2. Normal coronary origin with right dominance.

3. Mild CAD, CADRADS = 2. Mild disease in LAD and Left circumflex
artery.

4. Left atrial appendage occluder device in place. Contrast enters
the LA appendage, suggesting incomplete occlusion of the LA
appendage. Cannot exclude thrombus in the LA appendage due to
possible incomplete contrast opacification.

*** End of Addendum ***
EXAM:
OVER-READ INTERPRETATION  CT CHEST

The following report is an over-read performed by radiologist Dr.
Noris Regis [[email or web address]] on 01/27/2019. This
over-read does not include interpretation of cardiac or coronary
anatomy or pathology. The coronary calcium score/coronary CTA
interpretation by the cardiologist is attached.
FINDINGS: Aortic atherosclerosis. Within the visualized portions of the thorax
there are no suspicious appearing pulmonary nodules or masses, there
is no acute consolidative airspace disease, no pleural effusions, no
pneumothorax and no lymphadenopathy. Visualized portions of the
upper abdomen demonstrates a shrunken appearance and nodular contour
of the liver, indicative of advanced cirrhosis. There are no
aggressive appearing lytic or blastic lesions noted in the
visualized portions of the skeleton.
IMPRESSION: 1.  Aortic Atherosclerosis (80DZQ-WB8.8).
2. Cirrhosis.

## 2019-01-27 MED ORDER — METOPROLOL TARTRATE 5 MG/5ML IV SOLN
5.0000 mg | INTRAVENOUS | Status: DC | PRN
  Administered 2019-01-27: 5 mg via INTRAVENOUS
  Filled 2019-01-27: qty 5

## 2019-01-27 MED ORDER — NITROGLYCERIN 0.4 MG SL SUBL
0.8000 mg | SUBLINGUAL_TABLET | Freq: Once | SUBLINGUAL | Status: AC
  Administered 2019-01-27: 0.8 mg via SUBLINGUAL
  Filled 2019-01-27: qty 25

## 2019-01-27 MED ORDER — NITROGLYCERIN 0.4 MG SL SUBL
SUBLINGUAL_TABLET | SUBLINGUAL | Status: AC
  Filled 2019-01-27: qty 2

## 2019-01-27 MED ORDER — IOHEXOL 350 MG/ML SOLN
80.0000 mL | Freq: Once | INTRAVENOUS | Status: AC | PRN
  Administered 2019-01-27: 80 mL via INTRAVENOUS

## 2019-01-27 MED ORDER — METOPROLOL TARTRATE 5 MG/5ML IV SOLN
INTRAVENOUS | Status: AC
  Administered 2019-01-27: 5 mg via INTRAVENOUS
  Filled 2019-01-27: qty 5

## 2019-02-01 DIAGNOSIS — R531 Weakness: Secondary | ICD-10-CM | POA: Diagnosis not present

## 2019-02-01 DIAGNOSIS — R410 Disorientation, unspecified: Secondary | ICD-10-CM | POA: Diagnosis not present

## 2019-02-01 DIAGNOSIS — I2510 Atherosclerotic heart disease of native coronary artery without angina pectoris: Secondary | ICD-10-CM

## 2019-02-01 DIAGNOSIS — I4891 Unspecified atrial fibrillation: Secondary | ICD-10-CM | POA: Diagnosis not present

## 2019-02-01 DIAGNOSIS — R4182 Altered mental status, unspecified: Secondary | ICD-10-CM | POA: Diagnosis not present

## 2019-02-01 DIAGNOSIS — I119 Hypertensive heart disease without heart failure: Secondary | ICD-10-CM | POA: Diagnosis not present

## 2019-02-01 DIAGNOSIS — R404 Transient alteration of awareness: Secondary | ICD-10-CM | POA: Diagnosis not present

## 2019-02-01 DIAGNOSIS — N390 Urinary tract infection, site not specified: Secondary | ICD-10-CM | POA: Diagnosis not present

## 2019-02-01 DIAGNOSIS — E119 Type 2 diabetes mellitus without complications: Secondary | ICD-10-CM | POA: Diagnosis not present

## 2019-02-01 DIAGNOSIS — E860 Dehydration: Secondary | ICD-10-CM | POA: Diagnosis not present

## 2019-02-01 DIAGNOSIS — Z789 Other specified health status: Secondary | ICD-10-CM | POA: Diagnosis not present

## 2019-02-01 DIAGNOSIS — I10 Essential (primary) hypertension: Secondary | ICD-10-CM

## 2019-02-01 DIAGNOSIS — R079 Chest pain, unspecified: Secondary | ICD-10-CM | POA: Diagnosis not present

## 2019-02-01 DIAGNOSIS — I5032 Chronic diastolic (congestive) heart failure: Secondary | ICD-10-CM | POA: Diagnosis not present

## 2019-02-02 DIAGNOSIS — N390 Urinary tract infection, site not specified: Secondary | ICD-10-CM | POA: Diagnosis not present

## 2019-02-02 DIAGNOSIS — R079 Chest pain, unspecified: Secondary | ICD-10-CM | POA: Diagnosis not present

## 2019-02-02 DIAGNOSIS — R4182 Altered mental status, unspecified: Secondary | ICD-10-CM | POA: Diagnosis not present

## 2019-02-02 DIAGNOSIS — Z7982 Long term (current) use of aspirin: Secondary | ICD-10-CM | POA: Diagnosis not present

## 2019-02-02 DIAGNOSIS — G9349 Other encephalopathy: Secondary | ICD-10-CM | POA: Diagnosis not present

## 2019-02-02 DIAGNOSIS — E860 Dehydration: Secondary | ICD-10-CM | POA: Diagnosis not present

## 2019-02-02 DIAGNOSIS — I252 Old myocardial infarction: Secondary | ICD-10-CM | POA: Diagnosis not present

## 2019-02-02 DIAGNOSIS — I482 Chronic atrial fibrillation, unspecified: Secondary | ICD-10-CM | POA: Diagnosis present

## 2019-02-02 DIAGNOSIS — I119 Hypertensive heart disease without heart failure: Secondary | ICD-10-CM | POA: Diagnosis not present

## 2019-02-02 DIAGNOSIS — I5032 Chronic diastolic (congestive) heart failure: Secondary | ICD-10-CM | POA: Diagnosis present

## 2019-02-02 DIAGNOSIS — Z885 Allergy status to narcotic agent status: Secondary | ICD-10-CM | POA: Diagnosis not present

## 2019-02-02 DIAGNOSIS — E7800 Pure hypercholesterolemia, unspecified: Secondary | ICD-10-CM | POA: Diagnosis present

## 2019-02-02 DIAGNOSIS — Z79899 Other long term (current) drug therapy: Secondary | ICD-10-CM | POA: Diagnosis not present

## 2019-02-02 DIAGNOSIS — E119 Type 2 diabetes mellitus without complications: Secondary | ICD-10-CM | POA: Diagnosis not present

## 2019-02-02 DIAGNOSIS — Z882 Allergy status to sulfonamides status: Secondary | ICD-10-CM | POA: Diagnosis not present

## 2019-02-02 DIAGNOSIS — R410 Disorientation, unspecified: Secondary | ICD-10-CM | POA: Diagnosis not present

## 2019-02-02 DIAGNOSIS — I10 Essential (primary) hypertension: Secondary | ICD-10-CM | POA: Diagnosis not present

## 2019-02-02 DIAGNOSIS — I110 Hypertensive heart disease with heart failure: Secondary | ICD-10-CM | POA: Diagnosis present

## 2019-02-02 DIAGNOSIS — Z789 Other specified health status: Secondary | ICD-10-CM | POA: Diagnosis not present

## 2019-02-02 DIAGNOSIS — Z888 Allergy status to other drugs, medicaments and biological substances status: Secondary | ICD-10-CM | POA: Diagnosis not present

## 2019-02-02 DIAGNOSIS — I2510 Atherosclerotic heart disease of native coronary artery without angina pectoris: Secondary | ICD-10-CM | POA: Diagnosis not present

## 2019-02-04 DIAGNOSIS — I482 Chronic atrial fibrillation, unspecified: Secondary | ICD-10-CM | POA: Diagnosis not present

## 2019-02-04 DIAGNOSIS — E119 Type 2 diabetes mellitus without complications: Secondary | ICD-10-CM | POA: Diagnosis not present

## 2019-02-04 DIAGNOSIS — N390 Urinary tract infection, site not specified: Secondary | ICD-10-CM | POA: Diagnosis not present

## 2019-02-04 DIAGNOSIS — Z7984 Long term (current) use of oral hypoglycemic drugs: Secondary | ICD-10-CM | POA: Diagnosis not present

## 2019-02-04 DIAGNOSIS — I5032 Chronic diastolic (congestive) heart failure: Secondary | ICD-10-CM | POA: Diagnosis not present

## 2019-02-04 DIAGNOSIS — I110 Hypertensive heart disease with heart failure: Secondary | ICD-10-CM | POA: Diagnosis not present

## 2019-02-04 DIAGNOSIS — I2510 Atherosclerotic heart disease of native coronary artery without angina pectoris: Secondary | ICD-10-CM | POA: Diagnosis not present

## 2019-02-04 DIAGNOSIS — Z7982 Long term (current) use of aspirin: Secondary | ICD-10-CM | POA: Diagnosis not present

## 2019-02-05 DIAGNOSIS — I110 Hypertensive heart disease with heart failure: Secondary | ICD-10-CM | POA: Diagnosis not present

## 2019-02-05 DIAGNOSIS — I2510 Atherosclerotic heart disease of native coronary artery without angina pectoris: Secondary | ICD-10-CM | POA: Diagnosis not present

## 2019-02-05 DIAGNOSIS — E119 Type 2 diabetes mellitus without complications: Secondary | ICD-10-CM | POA: Diagnosis not present

## 2019-02-05 DIAGNOSIS — I5032 Chronic diastolic (congestive) heart failure: Secondary | ICD-10-CM | POA: Diagnosis not present

## 2019-02-05 DIAGNOSIS — I482 Chronic atrial fibrillation, unspecified: Secondary | ICD-10-CM | POA: Diagnosis not present

## 2019-02-05 DIAGNOSIS — N390 Urinary tract infection, site not specified: Secondary | ICD-10-CM | POA: Diagnosis not present

## 2019-02-09 ENCOUNTER — Ambulatory Visit: Payer: Medicare Other | Admitting: Cardiology

## 2019-02-09 DIAGNOSIS — I2510 Atherosclerotic heart disease of native coronary artery without angina pectoris: Secondary | ICD-10-CM | POA: Diagnosis not present

## 2019-02-09 DIAGNOSIS — I482 Chronic atrial fibrillation, unspecified: Secondary | ICD-10-CM | POA: Diagnosis not present

## 2019-02-09 DIAGNOSIS — I5032 Chronic diastolic (congestive) heart failure: Secondary | ICD-10-CM | POA: Diagnosis not present

## 2019-02-09 DIAGNOSIS — E119 Type 2 diabetes mellitus without complications: Secondary | ICD-10-CM | POA: Diagnosis not present

## 2019-02-09 DIAGNOSIS — I110 Hypertensive heart disease with heart failure: Secondary | ICD-10-CM | POA: Diagnosis not present

## 2019-02-09 DIAGNOSIS — N390 Urinary tract infection, site not specified: Secondary | ICD-10-CM | POA: Diagnosis not present

## 2019-02-10 DIAGNOSIS — I110 Hypertensive heart disease with heart failure: Secondary | ICD-10-CM | POA: Diagnosis not present

## 2019-02-10 DIAGNOSIS — I482 Chronic atrial fibrillation, unspecified: Secondary | ICD-10-CM | POA: Diagnosis not present

## 2019-02-10 DIAGNOSIS — I2510 Atherosclerotic heart disease of native coronary artery without angina pectoris: Secondary | ICD-10-CM | POA: Diagnosis not present

## 2019-02-10 DIAGNOSIS — E119 Type 2 diabetes mellitus without complications: Secondary | ICD-10-CM | POA: Diagnosis not present

## 2019-02-10 DIAGNOSIS — N390 Urinary tract infection, site not specified: Secondary | ICD-10-CM | POA: Diagnosis not present

## 2019-02-10 DIAGNOSIS — I5032 Chronic diastolic (congestive) heart failure: Secondary | ICD-10-CM | POA: Diagnosis not present

## 2019-02-11 DIAGNOSIS — E119 Type 2 diabetes mellitus without complications: Secondary | ICD-10-CM | POA: Diagnosis not present

## 2019-02-11 DIAGNOSIS — I110 Hypertensive heart disease with heart failure: Secondary | ICD-10-CM | POA: Diagnosis not present

## 2019-02-11 DIAGNOSIS — N390 Urinary tract infection, site not specified: Secondary | ICD-10-CM | POA: Diagnosis not present

## 2019-02-11 DIAGNOSIS — I2510 Atherosclerotic heart disease of native coronary artery without angina pectoris: Secondary | ICD-10-CM | POA: Diagnosis not present

## 2019-02-11 DIAGNOSIS — I482 Chronic atrial fibrillation, unspecified: Secondary | ICD-10-CM | POA: Diagnosis not present

## 2019-02-11 DIAGNOSIS — I5032 Chronic diastolic (congestive) heart failure: Secondary | ICD-10-CM | POA: Diagnosis not present

## 2019-02-11 DIAGNOSIS — I10 Essential (primary) hypertension: Secondary | ICD-10-CM | POA: Diagnosis not present

## 2019-02-16 ENCOUNTER — Other Ambulatory Visit: Payer: Self-pay | Admitting: Cardiology

## 2019-02-16 DIAGNOSIS — E119 Type 2 diabetes mellitus without complications: Secondary | ICD-10-CM | POA: Diagnosis not present

## 2019-02-16 DIAGNOSIS — N390 Urinary tract infection, site not specified: Secondary | ICD-10-CM | POA: Diagnosis not present

## 2019-02-16 DIAGNOSIS — I5032 Chronic diastolic (congestive) heart failure: Secondary | ICD-10-CM | POA: Diagnosis not present

## 2019-02-16 DIAGNOSIS — I2510 Atherosclerotic heart disease of native coronary artery without angina pectoris: Secondary | ICD-10-CM | POA: Diagnosis not present

## 2019-02-16 DIAGNOSIS — I110 Hypertensive heart disease with heart failure: Secondary | ICD-10-CM | POA: Diagnosis not present

## 2019-02-16 DIAGNOSIS — I482 Chronic atrial fibrillation, unspecified: Secondary | ICD-10-CM | POA: Diagnosis not present

## 2019-02-17 DIAGNOSIS — I2510 Atherosclerotic heart disease of native coronary artery without angina pectoris: Secondary | ICD-10-CM | POA: Diagnosis not present

## 2019-02-17 DIAGNOSIS — N390 Urinary tract infection, site not specified: Secondary | ICD-10-CM | POA: Diagnosis not present

## 2019-02-17 DIAGNOSIS — I482 Chronic atrial fibrillation, unspecified: Secondary | ICD-10-CM | POA: Diagnosis not present

## 2019-02-17 DIAGNOSIS — E119 Type 2 diabetes mellitus without complications: Secondary | ICD-10-CM | POA: Diagnosis not present

## 2019-02-17 DIAGNOSIS — I5032 Chronic diastolic (congestive) heart failure: Secondary | ICD-10-CM | POA: Diagnosis not present

## 2019-02-17 DIAGNOSIS — I110 Hypertensive heart disease with heart failure: Secondary | ICD-10-CM | POA: Diagnosis not present

## 2019-02-19 ENCOUNTER — Telehealth: Payer: Self-pay | Admitting: Cardiology

## 2019-02-19 NOTE — Telephone Encounter (Signed)
Cardiac Questionnaire:    Since your last visit or hospitalization:    1. Have you been having new or worsening chest pain? Yes new x 7 days   2. Have you been having new or worsening shortness of breath? some 3. Have you been having new or worsening leg swelling, wt gain, or increase in abdominal girth (pants fitting more tightly)? Left foot swells   4. Have you had any passing out spells? no    *A YES to any of these questions would result in the appointment being kept. *If all the answers to these questions are NO, we should indicate that given the current situation regarding the worldwide coronarvirus pandemic, at the recommendation of the CDC, we are looking to limit gatherings in our waiting area, and thus will reschedule their appointment beyond four weeks from today.   _____________   [id number] Pre-Screening Questions:   Do you currently have a fever? no  Have you recently travelled on a cruise, internationally, or to [place], [place], [place], [place], or [place], [place]) ? no  Have you been in contact with someone that is currently pending confirmation of Covid19 testing or has been confirmed to have the [place] virus? no  Are you currently experiencing fatigue or cough? A lot of fatigue, had it for years  [facility] [facility] E-VISIT FOR YOUR APPOINTMENT - PLEASE REVIEW IMPORTANT INFORMATION BELOW SEVERAL DAYS PRIOR TO YOUR APPOINTMENT  Due to the recent COVID-19 pandemic, we are transitioning in-person office visits to tele-medicine visits in an effort to decrease unnecessary exposure to our patients and staff. Medicare and most insurances are covering these visits without a copay needed. We also encourage you to sign up for MyChart if you have not already done so. You will need a smartphone if possible. For patients that do not have this, we can still complete the visit using a regular telephone but do prefer a smartphone to enable video when possible. You  may have a close family member that lives with you that can help. If possible, we also ask that you have a blood pressure cuff and scale at home to measure your blood pressure, heart rate and weight prior to your scheduled appointment. Patients with clinical needs that need an in-person evaluation and testing will still be able to come to the office if absolutely necessary. If you have any questions, feel free to call our office.   CONSENT FOR TELE-HEALTH VISIT - PLEASE REVIEW  I hereby voluntarily request, consent and authorize [facility] and its employed or contracted physicians, physician assistants, nurse practitioners or other licensed health care professionals (the Practitioner), to provide me with telemedicine health care services (the Services") as deemed necessary by the treating Practitioner. I acknowledge and consent to receive the Services by the Practitioner via telemedicine. I understand that the telemedicine visit will involve communicating with the Practitioner through live audiovisual communication technology and the disclosure of certain medical information by electronic transmission. I acknowledge that I have been given the opportunity to request an in-person assessment or other available alternative prior to the telemedicine visit and am voluntarily participating in the telemedicine visit.  I understand that I have the right to withhold or withdraw my consent to the use of telemedicine in the course of my care at any time, without affecting my right to future care or treatment, and that the Practitioner or I may terminate the telemedicine visit at any time. I understand that I have  the right to inspect all information obtained and/or recorded in the course of the telemedicine visit and may receive copies of available information for a reasonable fee.  I understand that some of the potential risks of receiving the Services via telemedicine include:   Delay or interruption in medical  evaluation due to technological equipment failure or disruption;  Information transmitted may not be sufficient (e.g. poor resolution of images) to allow for appropriate medical decision making by the Practitioner; and/or   In rare instances, security protocols could fail, causing a breach of personal health information.  Furthermore, I acknowledge that it is my responsibility to provide information about my medical history, conditions and care that is complete and accurate to the best of my ability. I acknowledge that Practitioner's advice, recommendations, and/or decision may be based on factors not within their control, such as incomplete or inaccurate data provided by me or distortions of diagnostic images or specimens that may result from electronic transmissions. I understand that the practice of medicine is not an exact science and that Practitioner makes no warranties or guarantees regarding treatment outcomes. I acknowledge that I will receive a copy of this consent concurrently upon execution via email to the email address I last provided but may also request a printed copy by calling the office of [facility].    I understand that my insurance will be billed for this visit.   I have read or had this consent read to me.  I understand the contents of this consent, which adequately explains the benefits and risks of the Services being provided via telemedicine.   I have been provided ample opportunity to ask questions regarding this consent and the Services and have had my questions answered to my satisfaction.  I give my informed consent for the services to be provided through the use of telemedicine in my medical care  By participating in this telemedicine visit I agree to the above. Patient gives verbal consent for televisit 02/19/2019 pp

## 2019-02-19 NOTE — Telephone Encounter (Signed)
Called patient for further evaluation after cardiac questionnaire. Patient reports that her most recent episode of chest pain was three nights ago. "The only way I know how to describe it is it felt like I was full of fluid." Patient took a nitroglycerin and the pain resolved. Patient denies any chest pain since then.   Patient had a cardiac CTA on 01/27/2019 and did not remember getting the results. Informed her of results and advised that Dr. Bettina Gavia does not think "her chest pain is cardiac in etiology."  Patient has shortness of breath with activity but this is not new. She is currently doing physical therapy with Home Health trying to improve her health. She reports that every time they check her BP it is around 117/70's.   Patient will keep her follow up televisit appointment as scheduled on Tuesday, 02/24/2019 at 10:40 am. No further questions.

## 2019-02-20 DIAGNOSIS — I482 Chronic atrial fibrillation, unspecified: Secondary | ICD-10-CM | POA: Diagnosis not present

## 2019-02-20 DIAGNOSIS — N390 Urinary tract infection, site not specified: Secondary | ICD-10-CM | POA: Diagnosis not present

## 2019-02-20 DIAGNOSIS — E119 Type 2 diabetes mellitus without complications: Secondary | ICD-10-CM | POA: Diagnosis not present

## 2019-02-20 DIAGNOSIS — I110 Hypertensive heart disease with heart failure: Secondary | ICD-10-CM | POA: Diagnosis not present

## 2019-02-20 DIAGNOSIS — I5032 Chronic diastolic (congestive) heart failure: Secondary | ICD-10-CM | POA: Diagnosis not present

## 2019-02-20 DIAGNOSIS — I2510 Atherosclerotic heart disease of native coronary artery without angina pectoris: Secondary | ICD-10-CM | POA: Diagnosis not present

## 2019-02-23 DIAGNOSIS — E119 Type 2 diabetes mellitus without complications: Secondary | ICD-10-CM | POA: Diagnosis not present

## 2019-02-23 DIAGNOSIS — I110 Hypertensive heart disease with heart failure: Secondary | ICD-10-CM | POA: Diagnosis not present

## 2019-02-23 DIAGNOSIS — N390 Urinary tract infection, site not specified: Secondary | ICD-10-CM | POA: Diagnosis not present

## 2019-02-23 DIAGNOSIS — I5032 Chronic diastolic (congestive) heart failure: Secondary | ICD-10-CM | POA: Diagnosis not present

## 2019-02-23 DIAGNOSIS — I2510 Atherosclerotic heart disease of native coronary artery without angina pectoris: Secondary | ICD-10-CM | POA: Diagnosis not present

## 2019-02-23 DIAGNOSIS — I482 Chronic atrial fibrillation, unspecified: Secondary | ICD-10-CM | POA: Diagnosis not present

## 2019-02-24 ENCOUNTER — Encounter: Payer: Self-pay | Admitting: Cardiology

## 2019-02-24 ENCOUNTER — Telehealth (INDEPENDENT_AMBULATORY_CARE_PROVIDER_SITE_OTHER): Payer: Medicare Other | Admitting: Cardiology

## 2019-02-24 VITALS — BP 146/81 | Ht 63.0 in | Wt 201.0 lb

## 2019-02-24 DIAGNOSIS — I482 Chronic atrial fibrillation, unspecified: Secondary | ICD-10-CM | POA: Diagnosis not present

## 2019-02-24 DIAGNOSIS — Z95818 Presence of other cardiac implants and grafts: Secondary | ICD-10-CM

## 2019-02-24 DIAGNOSIS — I110 Hypertensive heart disease with heart failure: Secondary | ICD-10-CM

## 2019-02-24 DIAGNOSIS — I5032 Chronic diastolic (congestive) heart failure: Secondary | ICD-10-CM

## 2019-02-24 DIAGNOSIS — I2510 Atherosclerotic heart disease of native coronary artery without angina pectoris: Secondary | ICD-10-CM

## 2019-02-24 DIAGNOSIS — K7460 Unspecified cirrhosis of liver: Secondary | ICD-10-CM

## 2019-02-24 DIAGNOSIS — E782 Mixed hyperlipidemia: Secondary | ICD-10-CM

## 2019-02-24 NOTE — Progress Notes (Signed)
Virtual Visit via Telephone Note   This visit type was conducted due to national recommendations for restrictions regarding the COVID-19 Pandemic (e.g. social distancing) in an effort to limit this patient's exposure and mitigate transmission in our community.  Due to her co-morbid illnesses, this patient is at least at moderate risk for complications without adequate follow up.  This format is felt to be most appropriate for this patient at this time.  The patient did not have access to video technology/had technical difficulties with video requiring transitioning to audio format only (telephone).  All issues noted in this document were discussed and addressed.  No physical exam could be performed with this format.  Please refer to the patient's chart for her  consent to telehealth for [facility]   Evaluation Performed:  Follow-up visit  Date:  02/24/2019   ID:  [name] [name], DOB 02/04/1946, MRN [id number]  Patient Location: Home  Provider Location: Home  PCP:  Greig Right, MD  Cardiologist:  No primary care provider on file. Dr Bettina Gavia Electrophysiologist:  None   Chief Complaint:  FU after [facility] admission, she had no cardiac problems  History of Present Illness:    [name] [name] is a 73 y.o. female who presents via audio/video conferencing for a telehealth visit today.    She has a history of chronic AF with watchman LAA device 2017, cirrhosis, hypertension and heart failure  and was last seen by me 12/24/2018  She was recently admitted to [facility] 02/01/2021 02/03/2019.  She presented primarily with delirium was felt to have a urinary tract infection treated improved blood culture was stable liver function was abnormal with elevated transaminase bilirubin 1.4 white count diminished 3400 hemoglobin 12 platelet count diminished 91,000 potassium 4.3 creatinine normal 0.8L chest x-ray was read as normal and her EKG 02/01/2019 personally reviewed by me showed atrial  fibrillation 89 bpm consider old lateral myocardial infarction.  Her cardiac troponin was undetectable and there is no indication of acute cardiac problems during the admission to the hospital.  The patient does not have symptoms concerning for COVID-19 infection (fever, chills, cough, or new shortness of breath).   She takes sulfa antibiotic for urinary prophylaxis and was having no urinary symptoms at the time of hospitalization.  She has known gallstones.  She is continuing to have intermittent sharp right upper quadrant right chest pain not anginal in nature I suspect she has symptomatic cholelithiasis and is conceivable she may have had cholecystitis during this recent hospitalization.  I asked her to phone [name] GI and discussed with him.  She tells me she is a poor surgical candidate I agree with her but if she had acute cholecystitis or gallbladder phlegmon she could benefit from percutaneous drainage.  She is not having typical angina edema shortness of breath palpitation or syncope.  She is not anticoagulated because of her chronic liver disease and thrombocytopenia   Past Medical History:  Diagnosis Date  . Allergic rhinitis   . Anxiety   . ANXIETY 12/26/2007   Qualifier: Diagnosis of  By: Ronnald Ramp CNA/MA, Janett Billow    . Arthritis   . Bronchitis   . CHF (congestive heart failure) ([facility])   . CHF, MILD 12/26/2007   Qualifier: Diagnosis of  By: Ronnald Ramp CNA/MA, Janett Billow    . Chronic atrial fibrillation 04/24/2016   Watchman atrial appendage device [facility] at [facility] for clot pevention  . Chronic liver disease 03/15/2016  . Complication of anesthesia    low blood  pressure once  . Depression   . Enlarged heart   . Essential hypertension 12/26/2007   Qualifier: Diagnosis of  By: Ronnald Ramp CNA/MA, Janett Billow    . Fibromyalgia   . GERD (gastroesophageal reflux disease)   . HTN (hypertension)    on medication since age 33  . Hypercholesteremia   . Hyperlipidemia 02/29/2016  . Hypothyroidism 03/20/2016  .  Idiopathic cirrhosis (HCC)    stage 4; sees [name] in [place]  . Mild CAD 02/29/2016  . Myocardial infarction [facility])    age 72  . Neuropathy, peripheral    lower extremities  . OSA (obstructive sleep apnea)   . OSA on CPAP 07/04/2008   CPAP [facility]/ [facility]   . Seasonal and perennial allergic rhinitis 01/23/2011   Allergy vaccine restarted at 1:50 08/02/2011 [place], [place] 2016   . Sleep apnea   . Somnolence   . SOMNOLENCE 12/26/2007   Annotation: excessive daytime Qualifier: Diagnosis of  By: Ronnald Ramp CNA/MA, Janett Billow    . Splenomegaly   . Type II or unspecified type diabetes mellitus without mention of complication, not stated as uncontrolled   . Urinary incontinence, nocturnal enuresis   . Urinary, incontinence, stress female    wears depends   Past Surgical History:  Procedure Laterality Date  . ABDOMINAL HYSTERECTOMY    . APPENDECTOMY    . BACK SURGERY    . CARPAL TUNNEL RELEASE     bilaterally  . CATARACT EXTRACTION W/ INTRAOCULAR LENS  IMPLANT, BILATERAL    . DILATION AND CURETTAGE OF UTERUS    . EYE SURGERY     cataract ext/ iol implants  . KNEE ARTHROSCOPY    . LUMBAR LAMINECTOMY  03/2011; 01/2012  . MOUTH SURGERY    . SPLENECTOMY    . TONSILLECTOMY AND ADENOIDECTOMY    . TOTAL ABDOMINAL HYSTERECTOMY       Current Meds  Medication Sig  . acarbose (PRECOSE) 100 MG tablet Take 100 mg by mouth 3 (three) times daily with meals.  Marland Kitchen alendronate (FOSAMAX) 70 MG tablet Take 70 mg by mouth once a week.   Marland Kitchen aspirin EC 81 MG tablet Take 1 tablet (81 mg total) by mouth daily.  Marland Kitchen buPROPion (WELLBUTRIN XL) 300 MG 24 hr tablet Take 300 mg by mouth daily.  . carvedilol (COREG) 25 MG tablet Take 25 mg by mouth 2 (two) times daily.  . diflunisal (DOLOBID) 500 MG TABS Take 1 tablet by mouth 2 (two) times daily.  Marland Kitchen doxazosin (CARDURA) 4 MG tablet Take 4 mg by mouth daily.  . ergocalciferol (VITAMIN D2) 50000 UNITS capsule Take 50,000 Units by mouth once a week. Monday  . gabapentin (NEURONTIN)  600 MG tablet Take 600 mg by mouth 3 (three) times daily.   . irbesartan-hydrochlorothiazide (AVALIDE) 300-12.5 MG tablet Take 1 tablet by mouth daily.  . isosorbide mononitrate (IMDUR) 60 MG 24 hr tablet Take 60 mg by mouth daily.  Marland Kitchen levothyroxine (SYNTHROID, LEVOTHROID) 125 MCG tablet Take 125 mcg by mouth daily before breakfast.   . metFORMIN (GLUCOPHAGE-XR) 500 MG 24 hr tablet Take 500 mg by mouth 2 (two) times daily.   Marland Kitchen NITROSTAT 0.4 MG SL tablet Place 0.4 mg under the tongue every 5 (five) minutes as needed for chest pain.   Marland Kitchen omeprazole (PRILOSEC) 40 MG capsule Take 1 capsule by mouth 2 (two) times daily.  . potassium chloride SA (K-DUR,KLOR-CON) 20 MEQ tablet Take 40 mEq by mouth 3 (three) times daily.   . rosuvastatin (CRESTOR) 5  MG tablet TAKE 1 TABLET  BY MOUTH DAILY AT 6 PM.  . TOVIAZ 4 MG TB24 tablet Take 1 tablet by mouth daily.  . traMADol (ULTRAM) 50 MG tablet Take 50 mg by mouth every 6 (six) hours as needed. For pain  . trimethoprim (TRIMPEX) 100 MG tablet Take 100 mg by mouth daily.  Marland Kitchen venlafaxine XR (EFFEXOR-XR) 150 MG 24 hr capsule Take 150 mg by mouth daily with breakfast.      Allergies:   Azithromycin; Cefuroxime axetil; Celecoxib; Codeine; Sulfa antibiotics; Hydralazine hcl; and Norvasc [amlodipine besylate]   Social History   Tobacco Use  . Smoking status: Never Smoker  . Smokeless tobacco: Never Used  Substance Use Topics  . Alcohol use: No  . Drug use: No     Family Hx: The patient's family history includes Anesthesia problems in her mother; CAD in her father; Cancer in her mother; Diabetes in her paternal grandmother; Emphysema in her mother; Heart attack in her father; Hypertension in her father; Rheum arthritis in her mother; Stroke in her father.  ROS:   Please see the history of present illness.     All other systems reviewed and are negative.   Prior CV studies:   The following studies were reviewed today:  Cardiac CTA IMPRESSION: 1. The  patient's coronary artery calcium score is 240, which places the patient in the 81 percentile.  2. Normal coronary origin with right dominance.  3. Mild CAD, CADRADS = 2. Mild disease in LAD and Left circumflex artery.  4. Left atrial appendage occluder device in place. Contrast enters the LA appendage, suggesting incomplete occlusion of the LA appendage. Cannot exclude thrombus in the LA appendage due to possible incomplete contrast opacification.    On: 01/27/2019 17:15  Labs/Other Tests and Data Reviewed:     Recent Labs: 12/25/2018: Hemoglobin 11.8; Platelets 98 01/20/2019: BUN 15; Creatinine, Ser 0.87; Potassium 4.1; Sodium 133   Recent Lipid Panel Lab Results  Component Value Date/Time   CHOL 117 12/24/2018 11:37 AM   TRIG 151 (H) 12/24/2018 11:37 AM   HDL 39 (L) 12/24/2018 11:37 AM   CHOLHDL 3.0 12/24/2018 11:37 AM   LDLCALC 48 12/24/2018 11:37 AM    Wt Readings from Last 3 Encounters:  01/26/19 211 lb 12.8 oz (96.1 kg)  12/25/18 214 lb 2 oz (97.1 kg)  12/24/18 214 lb 2 oz (97.1 kg)     Objective:    Vital Signs:  There were no vitals taken for this visit.   Well nourished, well developed female in no acute distress.   ASSESSMENT & PLAN:    1. Atrial fibrillation chronic rate is controlled continue current suppressant medication and not anticoagulated, she has left atrial occluder watchman device continue low-dose aspirin 2. Watchman device stable continue low-dose aspirin 3. Hypertensive heart disease with chronic heart failure stable continue her current diuretic she had no evidence of volume overload during her recent hospitalization blood pressures at target continue current treatment including ARB 4. Stable New York Heart Association class I continue her current diuretic and home self-management Daily weights blood pressure heart rate 5. Stable mild CAD her symptoms are very atypical and I think from a clinical context may be related to  choledocholithiasis. 6. Managed by GI [name] 7. Continue with statin  COVID-19 Education: The signs and symptoms of COVID-19 were discussed with the patient and how to seek care for testing (follow up with PCP or arrange E-visit).  The importance of social distancing was  discussed today.  Time:   Today, I have spent 26 minutes with the patient with telehealth technology discussing the above problems.     Medication Adjustments/Labs and Tests Ordered: Current medicines are reviewed at length with the patient today.  Concerns regarding medicines are outlined above.  Tests Ordered: No orders of the defined types were placed in this encounter.  Medication Changes: No orders of the defined types were placed in this encounter.   Disposition:  Follow up August 2020  SignedShirlee More, MD  02/24/2019 10:48 AM    [place]

## 2019-02-24 NOTE — Patient Instructions (Addendum)
Medication Instructions:  Your physician recommends that you continue on your current medications as directed. Please refer to the Current Medication list given to you today.  If you need a refill on your cardiac medications before your next appointment, please call your pharmacy.   Lab work: None  If you have labs (blood work) drawn today and your tests are completely normal, you will receive your results only by: Marland Kitchen MyChart Message (if you have MyChart) OR . A paper copy in the mail If you have any lab test that is abnormal or we need to change your treatment, we will call you to review the results.  Testing/Procedures: None  Follow-Up: At [facility], you and your health needs are our priority.  As part of our continuing mission to provide you with exceptional heart care, we have created designated Provider Care Teams.  These Care Teams include your primary Cardiologist (physician) and Advanced Practice Providers (APPs -  Physician Assistants and Nurse Practitioners) who all work together to provide you with the care you need, when you need it. . You will need a follow up appointment in 4 months: Monday, 06/29/2019, at 11:00 am in the [place] office.   Any Other Special Instructions Will Be Listed Below (If Applicable).  **Please contact Dr. Carmie End office regarding right sided chest pain, gallstones, and recent [facility] admission.

## 2019-02-26 DIAGNOSIS — I5032 Chronic diastolic (congestive) heart failure: Secondary | ICD-10-CM | POA: Diagnosis not present

## 2019-02-26 DIAGNOSIS — E119 Type 2 diabetes mellitus without complications: Secondary | ICD-10-CM | POA: Diagnosis not present

## 2019-02-26 DIAGNOSIS — N390 Urinary tract infection, site not specified: Secondary | ICD-10-CM | POA: Diagnosis not present

## 2019-02-26 DIAGNOSIS — I110 Hypertensive heart disease with heart failure: Secondary | ICD-10-CM | POA: Diagnosis not present

## 2019-02-26 DIAGNOSIS — I482 Chronic atrial fibrillation, unspecified: Secondary | ICD-10-CM | POA: Diagnosis not present

## 2019-02-26 DIAGNOSIS — I2510 Atherosclerotic heart disease of native coronary artery without angina pectoris: Secondary | ICD-10-CM | POA: Diagnosis not present

## 2019-04-06 DIAGNOSIS — N3090 Cystitis, unspecified without hematuria: Secondary | ICD-10-CM | POA: Diagnosis not present

## 2019-04-06 DIAGNOSIS — N3281 Overactive bladder: Secondary | ICD-10-CM | POA: Diagnosis not present

## 2019-04-14 DIAGNOSIS — R42 Dizziness and giddiness: Secondary | ICD-10-CM | POA: Diagnosis not present

## 2019-04-14 DIAGNOSIS — R4182 Altered mental status, unspecified: Secondary | ICD-10-CM | POA: Diagnosis not present

## 2019-04-14 DIAGNOSIS — R9431 Abnormal electrocardiogram [ECG] [EKG]: Secondary | ICD-10-CM | POA: Diagnosis not present

## 2019-04-14 DIAGNOSIS — R079 Chest pain, unspecified: Secondary | ICD-10-CM | POA: Diagnosis not present

## 2019-04-14 DIAGNOSIS — I5032 Chronic diastolic (congestive) heart failure: Secondary | ICD-10-CM

## 2019-04-14 DIAGNOSIS — I4892 Unspecified atrial flutter: Secondary | ICD-10-CM | POA: Diagnosis not present

## 2019-04-14 DIAGNOSIS — R110 Nausea: Secondary | ICD-10-CM | POA: Diagnosis not present

## 2019-04-14 DIAGNOSIS — E119 Type 2 diabetes mellitus without complications: Secondary | ICD-10-CM | POA: Diagnosis not present

## 2019-04-14 DIAGNOSIS — R531 Weakness: Secondary | ICD-10-CM | POA: Diagnosis not present

## 2019-04-14 DIAGNOSIS — F329 Major depressive disorder, single episode, unspecified: Secondary | ICD-10-CM | POA: Diagnosis not present

## 2019-04-14 DIAGNOSIS — E876 Hypokalemia: Secondary | ICD-10-CM | POA: Diagnosis not present

## 2019-04-14 DIAGNOSIS — I2510 Atherosclerotic heart disease of native coronary artery without angina pectoris: Secondary | ICD-10-CM | POA: Diagnosis not present

## 2019-04-14 DIAGNOSIS — I10 Essential (primary) hypertension: Secondary | ICD-10-CM | POA: Diagnosis not present

## 2019-04-15 DIAGNOSIS — F329 Major depressive disorder, single episode, unspecified: Secondary | ICD-10-CM | POA: Diagnosis not present

## 2019-04-15 DIAGNOSIS — E876 Hypokalemia: Secondary | ICD-10-CM | POA: Diagnosis not present

## 2019-04-15 DIAGNOSIS — R9431 Abnormal electrocardiogram [ECG] [EKG]: Secondary | ICD-10-CM | POA: Diagnosis not present

## 2019-04-15 DIAGNOSIS — E119 Type 2 diabetes mellitus without complications: Secondary | ICD-10-CM | POA: Diagnosis not present

## 2019-04-15 DIAGNOSIS — I5032 Chronic diastolic (congestive) heart failure: Secondary | ICD-10-CM | POA: Diagnosis not present

## 2019-04-15 DIAGNOSIS — I4892 Unspecified atrial flutter: Secondary | ICD-10-CM | POA: Diagnosis not present

## 2019-04-15 DIAGNOSIS — R531 Weakness: Secondary | ICD-10-CM | POA: Diagnosis not present

## 2019-04-15 DIAGNOSIS — I2510 Atherosclerotic heart disease of native coronary artery without angina pectoris: Secondary | ICD-10-CM | POA: Diagnosis not present

## 2019-04-16 ENCOUNTER — Other Ambulatory Visit: Payer: Self-pay | Admitting: Cardiology

## 2019-04-16 DIAGNOSIS — F329 Major depressive disorder, single episode, unspecified: Secondary | ICD-10-CM | POA: Diagnosis not present

## 2019-04-16 DIAGNOSIS — I4892 Unspecified atrial flutter: Secondary | ICD-10-CM | POA: Diagnosis not present

## 2019-04-16 DIAGNOSIS — I4891 Unspecified atrial fibrillation: Secondary | ICD-10-CM | POA: Diagnosis not present

## 2019-04-16 DIAGNOSIS — R9431 Abnormal electrocardiogram [ECG] [EKG]: Secondary | ICD-10-CM | POA: Diagnosis not present

## 2019-04-16 DIAGNOSIS — E876 Hypokalemia: Secondary | ICD-10-CM | POA: Diagnosis not present

## 2019-04-16 DIAGNOSIS — R531 Weakness: Secondary | ICD-10-CM | POA: Diagnosis not present

## 2019-04-16 DIAGNOSIS — E119 Type 2 diabetes mellitus without complications: Secondary | ICD-10-CM | POA: Diagnosis not present

## 2019-04-16 DIAGNOSIS — R42 Dizziness and giddiness: Secondary | ICD-10-CM | POA: Diagnosis not present

## 2019-04-16 DIAGNOSIS — I5032 Chronic diastolic (congestive) heart failure: Secondary | ICD-10-CM | POA: Diagnosis not present

## 2019-04-16 DIAGNOSIS — I2510 Atherosclerotic heart disease of native coronary artery without angina pectoris: Secondary | ICD-10-CM | POA: Diagnosis not present

## 2019-04-17 DIAGNOSIS — R9431 Abnormal electrocardiogram [ECG] [EKG]: Secondary | ICD-10-CM | POA: Diagnosis not present

## 2019-04-17 DIAGNOSIS — Z87440 Personal history of urinary (tract) infections: Secondary | ICD-10-CM | POA: Diagnosis not present

## 2019-04-17 DIAGNOSIS — Z7984 Long term (current) use of oral hypoglycemic drugs: Secondary | ICD-10-CM | POA: Diagnosis not present

## 2019-04-17 DIAGNOSIS — E876 Hypokalemia: Secondary | ICD-10-CM | POA: Diagnosis present

## 2019-04-17 DIAGNOSIS — R531 Weakness: Secondary | ICD-10-CM | POA: Diagnosis not present

## 2019-04-17 DIAGNOSIS — N179 Acute kidney failure, unspecified: Secondary | ICD-10-CM | POA: Diagnosis not present

## 2019-04-17 DIAGNOSIS — K7581 Nonalcoholic steatohepatitis (NASH): Secondary | ICD-10-CM | POA: Diagnosis present

## 2019-04-17 DIAGNOSIS — F329 Major depressive disorder, single episode, unspecified: Secondary | ICD-10-CM | POA: Diagnosis not present

## 2019-04-17 DIAGNOSIS — I110 Hypertensive heart disease with heart failure: Secondary | ICD-10-CM | POA: Diagnosis present

## 2019-04-17 DIAGNOSIS — I252 Old myocardial infarction: Secondary | ICD-10-CM | POA: Diagnosis not present

## 2019-04-17 DIAGNOSIS — I4821 Permanent atrial fibrillation: Secondary | ICD-10-CM | POA: Diagnosis not present

## 2019-04-17 DIAGNOSIS — T502X5A Adverse effect of carbonic-anhydrase inhibitors, benzothiadiazides and other diuretics, initial encounter: Secondary | ICD-10-CM | POA: Diagnosis present

## 2019-04-17 DIAGNOSIS — E119 Type 2 diabetes mellitus without complications: Secondary | ICD-10-CM | POA: Diagnosis not present

## 2019-04-17 DIAGNOSIS — I5032 Chronic diastolic (congestive) heart failure: Secondary | ICD-10-CM | POA: Diagnosis not present

## 2019-04-17 DIAGNOSIS — E039 Hypothyroidism, unspecified: Secondary | ICD-10-CM | POA: Diagnosis present

## 2019-04-17 DIAGNOSIS — Z79899 Other long term (current) drug therapy: Secondary | ICD-10-CM | POA: Diagnosis not present

## 2019-04-17 DIAGNOSIS — I4892 Unspecified atrial flutter: Secondary | ICD-10-CM | POA: Diagnosis not present

## 2019-04-17 DIAGNOSIS — I2510 Atherosclerotic heart disease of native coronary artery without angina pectoris: Secondary | ICD-10-CM | POA: Diagnosis not present

## 2019-04-19 DIAGNOSIS — I110 Hypertensive heart disease with heart failure: Secondary | ICD-10-CM | POA: Diagnosis not present

## 2019-04-19 DIAGNOSIS — R9431 Abnormal electrocardiogram [ECG] [EKG]: Secondary | ICD-10-CM | POA: Diagnosis not present

## 2019-04-19 DIAGNOSIS — Z7984 Long term (current) use of oral hypoglycemic drugs: Secondary | ICD-10-CM | POA: Diagnosis not present

## 2019-04-19 DIAGNOSIS — R531 Weakness: Secondary | ICD-10-CM | POA: Diagnosis not present

## 2019-04-19 DIAGNOSIS — F0391 Unspecified dementia with behavioral disturbance: Secondary | ICD-10-CM | POA: Diagnosis not present

## 2019-04-19 DIAGNOSIS — I2510 Atherosclerotic heart disease of native coronary artery without angina pectoris: Secondary | ICD-10-CM | POA: Diagnosis not present

## 2019-04-19 DIAGNOSIS — I252 Old myocardial infarction: Secondary | ICD-10-CM | POA: Diagnosis not present

## 2019-04-19 DIAGNOSIS — F329 Major depressive disorder, single episode, unspecified: Secondary | ICD-10-CM | POA: Diagnosis not present

## 2019-04-19 DIAGNOSIS — I5032 Chronic diastolic (congestive) heart failure: Secondary | ICD-10-CM | POA: Diagnosis not present

## 2019-04-19 DIAGNOSIS — E119 Type 2 diabetes mellitus without complications: Secondary | ICD-10-CM | POA: Diagnosis not present

## 2019-04-19 DIAGNOSIS — K7581 Nonalcoholic steatohepatitis (NASH): Secondary | ICD-10-CM | POA: Diagnosis not present

## 2019-04-19 DIAGNOSIS — E039 Hypothyroidism, unspecified: Secondary | ICD-10-CM | POA: Diagnosis not present

## 2019-04-19 DIAGNOSIS — I4892 Unspecified atrial flutter: Secondary | ICD-10-CM | POA: Diagnosis not present

## 2019-04-19 DIAGNOSIS — K219 Gastro-esophageal reflux disease without esophagitis: Secondary | ICD-10-CM | POA: Diagnosis not present

## 2019-04-19 DIAGNOSIS — K7469 Other cirrhosis of liver: Secondary | ICD-10-CM | POA: Diagnosis not present

## 2019-04-19 DIAGNOSIS — Z79899 Other long term (current) drug therapy: Secondary | ICD-10-CM | POA: Diagnosis not present

## 2019-04-21 DIAGNOSIS — K7581 Nonalcoholic steatohepatitis (NASH): Secondary | ICD-10-CM | POA: Diagnosis not present

## 2019-04-21 DIAGNOSIS — E119 Type 2 diabetes mellitus without complications: Secondary | ICD-10-CM | POA: Diagnosis not present

## 2019-04-21 DIAGNOSIS — I5032 Chronic diastolic (congestive) heart failure: Secondary | ICD-10-CM | POA: Diagnosis not present

## 2019-04-21 DIAGNOSIS — I2510 Atherosclerotic heart disease of native coronary artery without angina pectoris: Secondary | ICD-10-CM | POA: Diagnosis not present

## 2019-04-21 DIAGNOSIS — K7469 Other cirrhosis of liver: Secondary | ICD-10-CM | POA: Diagnosis not present

## 2019-04-21 DIAGNOSIS — I110 Hypertensive heart disease with heart failure: Secondary | ICD-10-CM | POA: Diagnosis not present

## 2019-04-23 DIAGNOSIS — I110 Hypertensive heart disease with heart failure: Secondary | ICD-10-CM | POA: Diagnosis not present

## 2019-04-23 DIAGNOSIS — K7581 Nonalcoholic steatohepatitis (NASH): Secondary | ICD-10-CM | POA: Diagnosis not present

## 2019-04-23 DIAGNOSIS — I5032 Chronic diastolic (congestive) heart failure: Secondary | ICD-10-CM | POA: Diagnosis not present

## 2019-04-23 DIAGNOSIS — I2510 Atherosclerotic heart disease of native coronary artery without angina pectoris: Secondary | ICD-10-CM | POA: Diagnosis not present

## 2019-04-23 DIAGNOSIS — K7469 Other cirrhosis of liver: Secondary | ICD-10-CM | POA: Diagnosis not present

## 2019-04-23 DIAGNOSIS — E119 Type 2 diabetes mellitus without complications: Secondary | ICD-10-CM | POA: Diagnosis not present

## 2019-05-01 DIAGNOSIS — K7581 Nonalcoholic steatohepatitis (NASH): Secondary | ICD-10-CM | POA: Diagnosis not present

## 2019-05-01 DIAGNOSIS — I5032 Chronic diastolic (congestive) heart failure: Secondary | ICD-10-CM | POA: Diagnosis not present

## 2019-05-01 DIAGNOSIS — K7469 Other cirrhosis of liver: Secondary | ICD-10-CM | POA: Diagnosis not present

## 2019-05-01 DIAGNOSIS — E119 Type 2 diabetes mellitus without complications: Secondary | ICD-10-CM | POA: Diagnosis not present

## 2019-05-01 DIAGNOSIS — I2510 Atherosclerotic heart disease of native coronary artery without angina pectoris: Secondary | ICD-10-CM | POA: Diagnosis not present

## 2019-05-01 DIAGNOSIS — I110 Hypertensive heart disease with heart failure: Secondary | ICD-10-CM | POA: Diagnosis not present

## 2019-05-04 DIAGNOSIS — Z6838 Body mass index (BMI) 38.0-38.9, adult: Secondary | ICD-10-CM | POA: Diagnosis not present

## 2019-05-04 DIAGNOSIS — E669 Obesity, unspecified: Secondary | ICD-10-CM | POA: Diagnosis not present

## 2019-05-04 DIAGNOSIS — M6281 Muscle weakness (generalized): Secondary | ICD-10-CM | POA: Diagnosis not present

## 2019-05-04 DIAGNOSIS — I10 Essential (primary) hypertension: Secondary | ICD-10-CM | POA: Diagnosis not present

## 2019-05-05 DIAGNOSIS — E119 Type 2 diabetes mellitus without complications: Secondary | ICD-10-CM | POA: Diagnosis not present

## 2019-05-05 DIAGNOSIS — I5032 Chronic diastolic (congestive) heart failure: Secondary | ICD-10-CM | POA: Diagnosis not present

## 2019-05-05 DIAGNOSIS — I2510 Atherosclerotic heart disease of native coronary artery without angina pectoris: Secondary | ICD-10-CM | POA: Diagnosis not present

## 2019-05-05 DIAGNOSIS — K7469 Other cirrhosis of liver: Secondary | ICD-10-CM | POA: Diagnosis not present

## 2019-05-05 DIAGNOSIS — I110 Hypertensive heart disease with heart failure: Secondary | ICD-10-CM | POA: Diagnosis not present

## 2019-05-05 DIAGNOSIS — K7581 Nonalcoholic steatohepatitis (NASH): Secondary | ICD-10-CM | POA: Diagnosis not present

## 2019-05-12 DIAGNOSIS — I2510 Atherosclerotic heart disease of native coronary artery without angina pectoris: Secondary | ICD-10-CM | POA: Diagnosis not present

## 2019-05-12 DIAGNOSIS — K7469 Other cirrhosis of liver: Secondary | ICD-10-CM | POA: Diagnosis not present

## 2019-05-12 DIAGNOSIS — I5032 Chronic diastolic (congestive) heart failure: Secondary | ICD-10-CM | POA: Diagnosis not present

## 2019-05-12 DIAGNOSIS — I110 Hypertensive heart disease with heart failure: Secondary | ICD-10-CM | POA: Diagnosis not present

## 2019-05-12 DIAGNOSIS — E119 Type 2 diabetes mellitus without complications: Secondary | ICD-10-CM | POA: Diagnosis not present

## 2019-05-12 DIAGNOSIS — K7581 Nonalcoholic steatohepatitis (NASH): Secondary | ICD-10-CM | POA: Diagnosis not present

## 2019-05-19 DIAGNOSIS — R531 Weakness: Secondary | ICD-10-CM | POA: Diagnosis not present

## 2019-05-19 DIAGNOSIS — I252 Old myocardial infarction: Secondary | ICD-10-CM | POA: Diagnosis not present

## 2019-05-19 DIAGNOSIS — Z79899 Other long term (current) drug therapy: Secondary | ICD-10-CM | POA: Diagnosis not present

## 2019-05-19 DIAGNOSIS — I2510 Atherosclerotic heart disease of native coronary artery without angina pectoris: Secondary | ICD-10-CM | POA: Diagnosis not present

## 2019-05-19 DIAGNOSIS — E119 Type 2 diabetes mellitus without complications: Secondary | ICD-10-CM | POA: Diagnosis not present

## 2019-05-19 DIAGNOSIS — K7581 Nonalcoholic steatohepatitis (NASH): Secondary | ICD-10-CM | POA: Diagnosis not present

## 2019-05-19 DIAGNOSIS — F0391 Unspecified dementia with behavioral disturbance: Secondary | ICD-10-CM | POA: Diagnosis not present

## 2019-05-19 DIAGNOSIS — K7469 Other cirrhosis of liver: Secondary | ICD-10-CM | POA: Diagnosis not present

## 2019-05-19 DIAGNOSIS — R9431 Abnormal electrocardiogram [ECG] [EKG]: Secondary | ICD-10-CM | POA: Diagnosis not present

## 2019-05-19 DIAGNOSIS — Z7984 Long term (current) use of oral hypoglycemic drugs: Secondary | ICD-10-CM | POA: Diagnosis not present

## 2019-05-19 DIAGNOSIS — F329 Major depressive disorder, single episode, unspecified: Secondary | ICD-10-CM | POA: Diagnosis not present

## 2019-05-19 DIAGNOSIS — I110 Hypertensive heart disease with heart failure: Secondary | ICD-10-CM | POA: Diagnosis not present

## 2019-05-19 DIAGNOSIS — K219 Gastro-esophageal reflux disease without esophagitis: Secondary | ICD-10-CM | POA: Diagnosis not present

## 2019-05-19 DIAGNOSIS — E039 Hypothyroidism, unspecified: Secondary | ICD-10-CM | POA: Diagnosis not present

## 2019-05-19 DIAGNOSIS — I4892 Unspecified atrial flutter: Secondary | ICD-10-CM | POA: Diagnosis not present

## 2019-05-19 DIAGNOSIS — I5032 Chronic diastolic (congestive) heart failure: Secondary | ICD-10-CM | POA: Diagnosis not present

## 2019-05-20 DIAGNOSIS — I2510 Atherosclerotic heart disease of native coronary artery without angina pectoris: Secondary | ICD-10-CM | POA: Diagnosis not present

## 2019-05-20 DIAGNOSIS — K7469 Other cirrhosis of liver: Secondary | ICD-10-CM | POA: Diagnosis not present

## 2019-05-20 DIAGNOSIS — K7581 Nonalcoholic steatohepatitis (NASH): Secondary | ICD-10-CM | POA: Diagnosis not present

## 2019-05-20 DIAGNOSIS — I5032 Chronic diastolic (congestive) heart failure: Secondary | ICD-10-CM | POA: Diagnosis not present

## 2019-05-20 DIAGNOSIS — I110 Hypertensive heart disease with heart failure: Secondary | ICD-10-CM | POA: Diagnosis not present

## 2019-05-20 DIAGNOSIS — E119 Type 2 diabetes mellitus without complications: Secondary | ICD-10-CM | POA: Diagnosis not present

## 2019-05-21 DIAGNOSIS — I2510 Atherosclerotic heart disease of native coronary artery without angina pectoris: Secondary | ICD-10-CM | POA: Diagnosis not present

## 2019-05-21 DIAGNOSIS — E1142 Type 2 diabetes mellitus with diabetic polyneuropathy: Secondary | ICD-10-CM | POA: Diagnosis not present

## 2019-05-27 DIAGNOSIS — K7469 Other cirrhosis of liver: Secondary | ICD-10-CM | POA: Diagnosis not present

## 2019-05-27 DIAGNOSIS — I2510 Atherosclerotic heart disease of native coronary artery without angina pectoris: Secondary | ICD-10-CM | POA: Diagnosis not present

## 2019-05-27 DIAGNOSIS — E119 Type 2 diabetes mellitus without complications: Secondary | ICD-10-CM | POA: Diagnosis not present

## 2019-05-27 DIAGNOSIS — I110 Hypertensive heart disease with heart failure: Secondary | ICD-10-CM | POA: Diagnosis not present

## 2019-05-27 DIAGNOSIS — I5032 Chronic diastolic (congestive) heart failure: Secondary | ICD-10-CM | POA: Diagnosis not present

## 2019-05-27 DIAGNOSIS — K7581 Nonalcoholic steatohepatitis (NASH): Secondary | ICD-10-CM | POA: Diagnosis not present

## 2019-06-02 DIAGNOSIS — I110 Hypertensive heart disease with heart failure: Secondary | ICD-10-CM | POA: Diagnosis not present

## 2019-06-02 DIAGNOSIS — I2510 Atherosclerotic heart disease of native coronary artery without angina pectoris: Secondary | ICD-10-CM | POA: Diagnosis not present

## 2019-06-02 DIAGNOSIS — I5032 Chronic diastolic (congestive) heart failure: Secondary | ICD-10-CM | POA: Diagnosis not present

## 2019-06-02 DIAGNOSIS — K7581 Nonalcoholic steatohepatitis (NASH): Secondary | ICD-10-CM | POA: Diagnosis not present

## 2019-06-02 DIAGNOSIS — E119 Type 2 diabetes mellitus without complications: Secondary | ICD-10-CM | POA: Diagnosis not present

## 2019-06-02 DIAGNOSIS — K7469 Other cirrhosis of liver: Secondary | ICD-10-CM | POA: Diagnosis not present

## 2019-06-16 DIAGNOSIS — E119 Type 2 diabetes mellitus without complications: Secondary | ICD-10-CM | POA: Diagnosis not present

## 2019-06-16 DIAGNOSIS — K7469 Other cirrhosis of liver: Secondary | ICD-10-CM | POA: Diagnosis not present

## 2019-06-16 DIAGNOSIS — I2510 Atherosclerotic heart disease of native coronary artery without angina pectoris: Secondary | ICD-10-CM | POA: Diagnosis not present

## 2019-06-16 DIAGNOSIS — I110 Hypertensive heart disease with heart failure: Secondary | ICD-10-CM | POA: Diagnosis not present

## 2019-06-16 DIAGNOSIS — K7581 Nonalcoholic steatohepatitis (NASH): Secondary | ICD-10-CM | POA: Diagnosis not present

## 2019-06-16 DIAGNOSIS — I5032 Chronic diastolic (congestive) heart failure: Secondary | ICD-10-CM | POA: Diagnosis not present

## 2019-06-29 ENCOUNTER — Ambulatory Visit: Payer: Medicare Other | Admitting: Cardiology

## 2019-07-06 DIAGNOSIS — D696 Thrombocytopenia, unspecified: Secondary | ICD-10-CM | POA: Diagnosis not present

## 2019-07-06 DIAGNOSIS — Z79899 Other long term (current) drug therapy: Secondary | ICD-10-CM | POA: Diagnosis not present

## 2019-07-06 DIAGNOSIS — Z6839 Body mass index (BMI) 39.0-39.9, adult: Secondary | ICD-10-CM | POA: Diagnosis not present

## 2019-07-06 DIAGNOSIS — E038 Other specified hypothyroidism: Secondary | ICD-10-CM | POA: Diagnosis not present

## 2019-07-06 DIAGNOSIS — E612 Magnesium deficiency: Secondary | ICD-10-CM | POA: Diagnosis not present

## 2019-07-06 DIAGNOSIS — I4891 Unspecified atrial fibrillation: Secondary | ICD-10-CM | POA: Diagnosis not present

## 2019-07-06 DIAGNOSIS — I10 Essential (primary) hypertension: Secondary | ICD-10-CM | POA: Diagnosis not present

## 2019-07-08 ENCOUNTER — Ambulatory Visit (INDEPENDENT_AMBULATORY_CARE_PROVIDER_SITE_OTHER): Payer: Medicare Other | Admitting: Cardiology

## 2019-07-08 ENCOUNTER — Other Ambulatory Visit: Payer: Self-pay

## 2019-07-08 ENCOUNTER — Encounter: Payer: Self-pay | Admitting: Cardiology

## 2019-07-08 VITALS — BP 152/60 | HR 62 | Ht 63.0 in | Wt 212.6 lb

## 2019-07-08 DIAGNOSIS — I5032 Chronic diastolic (congestive) heart failure: Secondary | ICD-10-CM

## 2019-07-08 DIAGNOSIS — K8020 Calculus of gallbladder without cholecystitis without obstruction: Secondary | ICD-10-CM | POA: Diagnosis not present

## 2019-07-08 DIAGNOSIS — K7460 Unspecified cirrhosis of liver: Secondary | ICD-10-CM

## 2019-07-08 DIAGNOSIS — Z95818 Presence of other cardiac implants and grafts: Secondary | ICD-10-CM

## 2019-07-08 DIAGNOSIS — I2510 Atherosclerotic heart disease of native coronary artery without angina pectoris: Secondary | ICD-10-CM

## 2019-07-08 DIAGNOSIS — I482 Chronic atrial fibrillation, unspecified: Secondary | ICD-10-CM | POA: Diagnosis not present

## 2019-07-08 DIAGNOSIS — I110 Hypertensive heart disease with heart failure: Secondary | ICD-10-CM

## 2019-07-08 DIAGNOSIS — R161 Splenomegaly, not elsewhere classified: Secondary | ICD-10-CM | POA: Diagnosis not present

## 2019-07-08 NOTE — Progress Notes (Signed)
Cardiology Office Note:    Date:  07/08/2019   ID:  [name] [name], DOB [birth year], MRN [id number]  PCP:  Greig Right, MD  Cardiologist:  Shirlee More, MD    Referring MD: Greig Right, MD    ASSESSMENT:    1. Chronic atrial fibrillation   2. Presence of Watchman left atrial appendage closure device   3. Hypertensive heart disease with chronic diastolic congestive heart failure ([place])   4. Mild CAD   5. Idiopathic cirrhosis (HCC)    PLAN:    In order of problems listed above:  1. Chronic atrial fibrillation -stable rate is controlled continue beta-blocker 2. Presence of Watchman Device - Secondary to atrial fibrillation.  Stable she is no longer anticoagulated 3. Hypertensive heart disease with chronic diastolic congestive heart failure -  Stable BP at target amlodipine discontinued due to edema continue current treatment including beta-blocker alpha-blocker ARB. Cirrhosis stable managed by GI and her PCP. Next appointment: 6 months   Medication Adjustments/Labs and Tests Ordered: Current medicines are reviewed at length with the patient today.  Concerns regarding medicines are outlined above.  No orders of the defined types were placed in this encounter.  No orders of the defined types were placed in this encounter.   Chief Complaint  Patient presents with  . Atrial Flutter    History of Present Illness:     [name] [name] is a 73 y.o. female with a hx of  chronic AF with watchman LAA device 2017, cirrhosis, hypertension and diastolic heart failure  last seen 02/24/2019. She has improved since her last visit has had no further episodes of delirium or bacteremia.  No complaints of edema shortness of breath chest pain palpitation or syncope.  I requested recent labs done at her PCP office he tells me they were normal except for magnesium being depleted and she continues supplementation  Compliance with diet, lifestyle and medications: Yes Past Medical History:   Diagnosis Date  . Allergic rhinitis   . Anxiety   . ANXIETY 12/26/2007   Qualifier: Diagnosis of  By: Ronnald Ramp CNA/MA, Janett Billow    . Arthritis   . Bronchitis   . CHF (congestive heart failure) ([place])   . CHF, MILD 12/26/2007   Qualifier: Diagnosis of  By: Ronnald Ramp CNA/MA, Janett Billow    . Chronic atrial fibrillation 04/24/2016   Watchman atrial appendage device placed at [facility] for clot pevention  . Chronic liver disease 03/15/2016  . Complication of anesthesia    low blood pressure once  . Depression   . Enlarged heart   . Essential hypertension 12/26/2007   Qualifier: Diagnosis of  By: Ronnald Ramp CNA/MA, Janett Billow    . Fibromyalgia   . GERD (gastroesophageal reflux disease)   . HTN (hypertension)    on medication since age 73  . Hypercholesteremia   . Hyperlipidemia 02/29/2016  . Hypothyroidism 03/20/2016  . Idiopathic cirrhosis (HCC)    stage 4; sees [name] in [place]  . Mild CAD 02/29/2016  . Myocardial infarction [facility])    age 49  . Neuropathy, peripheral    lower extremities  . OSA (obstructive sleep apnea)   . OSA on CPAP 07/04/2008   CPAP AutoSet/ [facility]   . Seasonal and perennial allergic rhinitis 01/23/2011   Allergy vaccine restarted at 1:50 08/02/2011 [place], [place] 2016   . Sleep apnea   . Somnolence   . SOMNOLENCE 12/26/2007   Annotation: excessive daytime Qualifier: Diagnosis of  By: Ronnald Ramp CNA/MA, Janett Billow    .  Splenomegaly   . Type II or unspecified type diabetes mellitus without mention of complication, not stated as uncontrolled   . Urinary incontinence, nocturnal enuresis   . Urinary, incontinence, stress female    wears depends    Past Surgical History:  Procedure Laterality Date  . ABDOMINAL HYSTERECTOMY    . APPENDECTOMY    . BACK SURGERY    . CARPAL TUNNEL RELEASE     bilaterally  . CATARACT EXTRACTION W/ INTRAOCULAR LENS  IMPLANT, BILATERAL    . DILATION AND CURETTAGE OF UTERUS    . EYE SURGERY     cataract ext/ iol implants  . KNEE ARTHROSCOPY    . LUMBAR LAMINECTOMY   03/2011; 01/2012  . MOUTH SURGERY    . SPLENECTOMY    . TONSILLECTOMY AND ADENOIDECTOMY    . TOTAL ABDOMINAL HYSTERECTOMY      Current Medications: Current Meds  Medication Sig  . acarbose (PRECOSE) 100 MG tablet Take 100 mg by mouth 3 (three) times daily with meals.  Marland Kitchen alendronate (FOSAMAX) 70 MG tablet Take 70 mg by mouth once a week.   Marland Kitchen aspirin EC 81 MG tablet Take 1 tablet (81 mg total) by mouth daily.  Marland Kitchen buPROPion (WELLBUTRIN XL) 300 MG 24 hr tablet Take 300 mg by mouth daily.  . carvedilol (COREG) 25 MG tablet Take 25 mg by mouth 2 (two) times daily.  . diflunisal (DOLOBID) 500 MG TABS Take 1 tablet by mouth 2 (two) times daily.  Marland Kitchen doxazosin (CARDURA) 4 MG tablet Take 1 tablet (4 mg total) by mouth daily.  . ergocalciferol (VITAMIN D2) 50000 UNITS capsule Take 50,000 Units by mouth once a week. Monday  . gabapentin (NEURONTIN) 600 MG tablet Take 600 mg by mouth 3 (three) times daily.   . irbesartan (AVAPRO) 300 MG tablet Take 300 mg by mouth daily.  . isosorbide mononitrate (IMDUR) 60 MG 24 hr tablet Take 60 mg by mouth daily.  Marland Kitchen levothyroxine (SYNTHROID, LEVOTHROID) 125 MCG tablet Take 125 mcg by mouth daily before breakfast.   . metFORMIN (GLUCOPHAGE-XR) 500 MG 24 hr tablet Take 500 mg by mouth 2 (two) times daily.   Marland Kitchen NITROSTAT 0.4 MG SL tablet Place 0.4 mg under the tongue every 5 (five) minutes as needed for chest pain.   Marland Kitchen omeprazole (PRILOSEC) 40 MG capsule Take 1 capsule by mouth 2 (two) times daily.  . potassium chloride SA (K-DUR,KLOR-CON) 20 MEQ tablet Take 40 mEq by mouth 3 (three) times daily.   . rosuvastatin (CRESTOR) 5 MG tablet TAKE 1 TABLET  BY MOUTH DAILY AT 6 PM.  . TOVIAZ 4 MG TB24 tablet Take 1 tablet by mouth daily.  . traMADol (ULTRAM) 50 MG tablet Take 50 mg by mouth every 6 (six) hours as needed. For pain  . trimethoprim (TRIMPEX) 100 MG tablet Take 100 mg by mouth daily.  Marland Kitchen venlafaxine XR (EFFEXOR-XR) 150 MG 24 hr capsule Take 150 mg by mouth daily with  breakfast.      Allergies:   Azithromycin, Cefuroxime axetil, Celecoxib, Codeine, Sulfa antibiotics, Amlodipine, Hydralazine hcl, and Norvasc [amlodipine besylate]   Social History   Socioeconomic History  . Marital status: Married    Spouse name: Not on file  . Number of children: Not on file  . Years of education: Not on file  . Highest education level: Not on file  Occupational History  . Not on file  Social Needs  . Financial resource strain: Not on file  . Food insecurity  Worry: Not on file    Inability: Not on file  . Transportation needs    Medical: Not on file    Non-medical: Not on file  Tobacco Use  . Smoking status: Never Smoker  . Smokeless tobacco: Never Used  Substance and Sexual Activity  . Alcohol use: No  . Drug use: No  . Sexual activity: Not on file  Lifestyle  . Physical activity    Days per week: Not on file    Minutes per session: Not on file  . Stress: Not on file  Relationships  . Social Herbalist on phone: Not on file    Gets together: Not on file    Attends religious service: Not on file    Active member of club or organization: Not on file    Attends meetings of clubs or organizations: Not on file    Relationship status: Not on file  Other Topics Concern  . Not on file  Social History Narrative  . Not on file     Family History: The patient's family history includes Anesthesia problems in her mother; CAD in her father; Cancer in her mother; Diabetes in her paternal grandmother; Emphysema in her mother; Heart attack in her father; Hypertension in her father; Rheum arthritis in her mother; Stroke in her father. ROS:   Please see the history of present illness.    All other systems reviewed and are negative.  EKGs/Labs/Other Studies Reviewed:    The following studies were reviewed today:  Recent Labs:  07/08/2019: Hemoglobin 12.0 hematocrit 36.7 platelets 90,000 Potassium 4.5 GFR greater than 60 cc 12/25/2018:  Hemoglobin 11.8; Platelets 98 01/20/2019: BUN 15; Creatinine, Ser 0.87; Potassium 4.1; Sodium 133  Recent Lipid Panel    Component Value Date/Time   CHOL 117 12/24/2018 1137   TRIG 151 (H) 12/24/2018 1137   HDL 39 (L) 12/24/2018 1137   CHOLHDL 3.0 12/24/2018 1137   LDLCALC 48 12/24/2018 1137    Physical Exam:    VS:  BP (!) 152/60 (BP Location: Right Arm, Patient Position: Sitting, Cuff Size: Large)   Pulse 62   Ht 5\' 3"  (1.6 m)   Wt 212 lb 9.6 oz (96.4 kg)   SpO2 97%   BMI 37.66 kg/m     Wt Readings from Last 3 Encounters:  07/08/19 212 lb 9.6 oz (96.4 kg)  02/24/19 201 lb (91.2 kg)  01/26/19 211 lb 12.8 oz (96.1 kg)     GEN:  Well nourished, well developed in no acute distress HEENT: Normal NECK: No JVD; No carotid bruits LYMPHATICS: No lymphadenopathy CARDIAC: Irregular rhythm variable first heart sound no murmurs, rubs, gallops RESPIRATORY:  Clear to auscultation without rales, wheezing or rhonchi  ABDOMEN: Soft, non-tender, non-distended MUSCULOSKELETAL:  No edema; No deformity  SKIN: Warm and dry NEUROLOGIC:  Alert and oriented x 3 PSYCHIATRIC:  Normal affect    Signed, Shirlee More, MD  07/08/2019 1:35 PM    [facility]

## 2019-07-08 NOTE — Patient Instructions (Signed)
Medication Instructions:  No medication changes today.  If you need a refill on your cardiac medications before your next appointment, please call your pharmacy.   Lab work: No lab work today.  If you have labs (blood work) drawn today and your tests are completely normal, you will receive your results only by: Marland Kitchen MyChart Message (if you have MyChart) OR . A paper copy in the mail If you have any lab test that is abnormal or we need to change your treatment, we will call you to review the results.  Testing/Procedures: None ordered today.  Follow-Up: At [facility], you and your health needs are our priority.  As part of our continuing mission to provide you with exceptional heart care, we have created designated Provider Care Teams.  These Care Teams include your primary Cardiologist (physician) and Advanced Practice Providers (APPs -  Physician Assistants and Nurse Practitioners) who all work together to provide you with the care you need, when you need it. . You will need a follow up appointment in 6 months.    Any Other Special Instructions Will Be Listed Below (If Applicable).

## 2019-07-14 DIAGNOSIS — D649 Anemia, unspecified: Secondary | ICD-10-CM | POA: Diagnosis not present

## 2019-07-14 DIAGNOSIS — K219 Gastro-esophageal reflux disease without esophagitis: Secondary | ICD-10-CM | POA: Diagnosis not present

## 2019-07-14 DIAGNOSIS — K7460 Unspecified cirrhosis of liver: Secondary | ICD-10-CM | POA: Diagnosis not present

## 2019-07-28 ENCOUNTER — Encounter: Payer: Self-pay | Admitting: Internal Medicine

## 2019-07-29 ENCOUNTER — Encounter: Payer: Self-pay | Admitting: Internal Medicine

## 2019-07-29 ENCOUNTER — Other Ambulatory Visit: Payer: Self-pay

## 2019-07-29 ENCOUNTER — Ambulatory Visit (INDEPENDENT_AMBULATORY_CARE_PROVIDER_SITE_OTHER): Payer: Medicare Other | Admitting: Internal Medicine

## 2019-07-29 VITALS — BP 158/70 | HR 62 | Temp 97.7°F | Ht 63.0 in | Wt 215.8 lb

## 2019-07-29 DIAGNOSIS — K769 Liver disease, unspecified: Secondary | ICD-10-CM

## 2019-07-29 DIAGNOSIS — G4733 Obstructive sleep apnea (adult) (pediatric): Secondary | ICD-10-CM

## 2019-07-29 DIAGNOSIS — I2510 Atherosclerotic heart disease of native coronary artery without angina pectoris: Secondary | ICD-10-CM | POA: Diagnosis not present

## 2019-07-29 DIAGNOSIS — J410 Simple chronic bronchitis: Secondary | ICD-10-CM | POA: Diagnosis not present

## 2019-07-29 DIAGNOSIS — Z23 Encounter for immunization: Secondary | ICD-10-CM

## 2019-07-29 NOTE — Progress Notes (Signed)
HPI F never smoker, followed for OSA, chronic bronchitis, allergic rhinitis, complicated by nonalcoholic cirrhosis/[name] GI,   A. Fib/"Watchman" clot- preventing device placed in her atrial appendage , HBP, MI, chronic diastolic CHF, GERD, DM 2, hypothyroid NPSG 11/05/01-AHI 93/hour, desaturation to 63%, body weight 217 pounds PFT 07/26/09-mild restriction and reduction of diffusion. No obstruction, no response to dilator -----------------------------------------------------------------------------------  01/25/2019- 73 year old female never smoker followed for OSA, chronic bronchitis, allergic rhinitis, complicated by nonalcoholic cirrhosis/[name] GI,  AFib/"Watchman" clot- preventing device placed in her atrial appendage, HBP, MI, chronic diastolic CHF, GERD, DM 2, hypothyroid  CPAP auto 5-20/[employer] Download compliance 27% AHI 9.5/hour Body weight today 211 lbs She sits in her recliner- rocker much of the day, dozing on and off. Husband goes to bed early evening and leaves her. She notes she sleeps better with CPAP, but often falls asleep in chair and never puts it on.  Breathing is comfortable, off inhalers and without cough or wheeze. Limiting DOE is related to obesity, arthritis, cardiac status and total lack of exercise.  Being evaluated for  Chest pains with Cardiac CT tomorrow.   07/29/2019- 73 year old female never smoker followed for OSA, chronic bronchitis, allergic rhinitis, complicated by nonalcoholic cirrhosis/[name] GI,  AFib/"Watchman" clot- preventing device placed in her atrial appendage, HBP, MI, chronic diastolic CHF, GERD, DM 2, hypothyroid  CPAP auto 5-15/[facility] ------OSA on CPAP auto 5-15, DME: [facility]; pt states her machine is over 41 years old and that at one point her machine stopped working; however, starting working again recently [employer] compliance 67%, AHI 3.4/ hr Machine old and not functioning reliably.  Only SOB when first lies down, until she puts CPAP on.    ROS-see HPI  + = positive Constitutional:   No-   weight loss, night sweats, fevers, chills, + fatigue, lassitude. HEENT:   No-  headaches, difficulty swallowing, tooth/dental problems, sore throat,       No-  sneezing, itching, ear ache, nasal congestion, post nasal drip,  CV:  No-   chest pain, orthopnea, PND, swelling in lower extremities, anasarca, dizziness, palpitations Resp:+ shortness of breath with exertion or at rest.             productive cough,  No non-productive cough,  No- coughing up of blood.              No-   change in color of mucus.  No- wheezing.   Skin: No-   rash or lesions. GI:  No-   heartburn, indigestion, abdominal pain, nausea, vomiting,  GU:  MS:  + joint pain or swelling. + back pain. Neuro-     nothing unusual Psych:  No- change in mood or affect. No depression or anxiety.  No memory loss.  OBJ- Physical Exam General- Alert, Oriented, Affect-appropriate, Distress- none acute,  +morbid obesity Skin-  + ecchymoses on arms Lymphadenopathy- none Head- atraumatic            Eyes- Gross vision intact, PERRLA, conjunctivae and secretions clear            Ears- Hearing, canals-normal            Nose- Clear, no-Septal dev, mucus, polyps, erosion, perforation             Throat- Mallampati II-III , mucosa clear , drainage- none, tonsils- atrophic Neck- flexible , trachea midline, no stridor , thyroid nl, carotid no bruit Chest - symmetrical excursion , unlabored  Heart/CV- IRR/Afib , no murmur , no gallop  , no rub, nl s1 s2                           - JVD- none , edema- none, stasis changes +, varices- none           Lung- clear to P&A, wheeze- none, cough- none , dullness-none, rub- none           Chest wall-  Abd-  Br/ Gen/ Rectal- Not done, not indicated Extrem- cyanosis- none, clubbing, none, atrophy- none, strength- nl. + Rolling walker. +ankle braces Neuro- grossly intact to observation

## 2019-07-29 NOTE — Patient Instructions (Addendum)
Order- DME [facility]- please replace old CPAP machine- auto 5-15, mask of choice, humidifier, supplies, AirView/ card   Order- flu vax senior  Please call if we can help

## 2019-07-31 NOTE — Assessment & Plan Note (Signed)
Benefits from CPAP with good compliance and control. Old machine beginning to malfunction. Replace auto 5-15

## 2019-07-31 NOTE — Assessment & Plan Note (Signed)
Well controlled. Orthopnea more likely related to cardiac status.

## 2019-08-06 ENCOUNTER — Telehealth: Payer: Self-pay | Admitting: Internal Medicine

## 2019-08-06 NOTE — Telephone Encounter (Signed)
Will need to contact [facility] on 9/18 as it is after 5pm and they are not open.

## 2019-08-07 NOTE — Telephone Encounter (Signed)
PCCs, can you all help with this?

## 2019-08-07 NOTE — Telephone Encounter (Signed)
Not sure what to do with this the chart in epic doesn't go back that far

## 2019-08-10 NOTE — Telephone Encounter (Signed)
I put in a request for the paper chart to be retrieved from the warehouse

## 2019-08-11 DIAGNOSIS — N3090 Cystitis, unspecified without hematuria: Secondary | ICD-10-CM | POA: Diagnosis not present

## 2019-08-11 DIAGNOSIS — N3281 Overactive bladder: Secondary | ICD-10-CM | POA: Diagnosis not present

## 2019-08-13 NOTE — Telephone Encounter (Signed)
[name] Piety do you have any update on this paperwork?

## 2019-08-13 NOTE — Telephone Encounter (Signed)
I ask for the chart on Monday 9/21 but it took 2 weeks last time I ask for the paperchart

## 2019-08-17 NOTE — Telephone Encounter (Signed)
I finally got the paperchart today on this patient. I have now faxed the office notes before each sleep study that the patient has had to [facility]. I have received confirmation that the fax was received

## 2019-09-02 DIAGNOSIS — E113293 Type 2 diabetes mellitus with mild nonproliferative diabetic retinopathy without macular edema, bilateral: Secondary | ICD-10-CM | POA: Diagnosis not present

## 2019-09-08 DIAGNOSIS — E1142 Type 2 diabetes mellitus with diabetic polyneuropathy: Secondary | ICD-10-CM | POA: Diagnosis not present

## 2019-09-08 DIAGNOSIS — E039 Hypothyroidism, unspecified: Secondary | ICD-10-CM | POA: Diagnosis not present

## 2019-09-08 DIAGNOSIS — Z5181 Encounter for therapeutic drug level monitoring: Secondary | ICD-10-CM | POA: Diagnosis not present

## 2019-09-08 DIAGNOSIS — Z79899 Other long term (current) drug therapy: Secondary | ICD-10-CM | POA: Diagnosis not present

## 2019-09-08 DIAGNOSIS — I2510 Atherosclerotic heart disease of native coronary artery without angina pectoris: Secondary | ICD-10-CM | POA: Diagnosis not present

## 2019-10-14 ENCOUNTER — Other Ambulatory Visit: Payer: Self-pay

## 2019-10-19 ENCOUNTER — Other Ambulatory Visit: Payer: Self-pay | Admitting: Family Medicine

## 2019-10-19 DIAGNOSIS — M859 Disorder of bone density and structure, unspecified: Secondary | ICD-10-CM | POA: Diagnosis not present

## 2019-10-19 DIAGNOSIS — E038 Other specified hypothyroidism: Secondary | ICD-10-CM | POA: Diagnosis not present

## 2019-10-19 DIAGNOSIS — Z6837 Body mass index (BMI) 37.0-37.9, adult: Secondary | ICD-10-CM | POA: Diagnosis not present

## 2019-10-19 DIAGNOSIS — I10 Essential (primary) hypertension: Secondary | ICD-10-CM | POA: Diagnosis not present

## 2019-10-19 DIAGNOSIS — Z1231 Encounter for screening mammogram for malignant neoplasm of breast: Secondary | ICD-10-CM

## 2019-10-19 DIAGNOSIS — E1142 Type 2 diabetes mellitus with diabetic polyneuropathy: Secondary | ICD-10-CM | POA: Diagnosis not present

## 2019-10-19 DIAGNOSIS — Z7983 Long term (current) use of bisphosphonates: Secondary | ICD-10-CM

## 2019-10-19 DIAGNOSIS — E669 Obesity, unspecified: Secondary | ICD-10-CM | POA: Diagnosis not present

## 2019-10-19 DIAGNOSIS — M797 Fibromyalgia: Secondary | ICD-10-CM | POA: Diagnosis not present

## 2019-10-21 ENCOUNTER — Other Ambulatory Visit: Payer: Self-pay | Admitting: Cardiology

## 2019-11-02 ENCOUNTER — Other Ambulatory Visit: Payer: Self-pay | Admitting: Family Medicine

## 2019-11-02 DIAGNOSIS — Z7983 Long term (current) use of bisphosphonates: Secondary | ICD-10-CM

## 2019-11-02 DIAGNOSIS — Z79899 Other long term (current) drug therapy: Secondary | ICD-10-CM

## 2019-11-04 ENCOUNTER — Other Ambulatory Visit: Payer: Self-pay

## 2019-11-04 ENCOUNTER — Ambulatory Visit
Admission: RE | Admit: 2019-11-04 | Discharge: 2019-11-04 | Disposition: A | Discharge status: Home / Self Care | Payer: Medicare Other | Source: Ambulatory Visit | Attending: Family Medicine | Admitting: Family Medicine

## 2019-11-04 DIAGNOSIS — Z79899 Other long term (current) drug therapy: Secondary | ICD-10-CM

## 2019-11-04 DIAGNOSIS — Z7983 Long term (current) use of bisphosphonates: Secondary | ICD-10-CM

## 2019-11-18 ENCOUNTER — Other Ambulatory Visit: Payer: Self-pay | Admitting: Cardiology

## 2019-12-11 DIAGNOSIS — N3281 Overactive bladder: Secondary | ICD-10-CM | POA: Diagnosis not present

## 2019-12-11 DIAGNOSIS — N390 Urinary tract infection, site not specified: Secondary | ICD-10-CM | POA: Diagnosis not present

## 2019-12-18 ENCOUNTER — Ambulatory Visit: Payer: Medicare Other

## 2019-12-24 ENCOUNTER — Other Ambulatory Visit: Payer: Self-pay

## 2019-12-24 ENCOUNTER — Ambulatory Visit
Admission: RE | Admit: 2019-12-24 | Discharge: 2019-12-24 | Disposition: A | Discharge status: Home / Self Care | Payer: Medicare Other | Source: Ambulatory Visit | Attending: Family Medicine | Admitting: Family Medicine

## 2019-12-24 DIAGNOSIS — Z1231 Encounter for screening mammogram for malignant neoplasm of breast: Secondary | ICD-10-CM | POA: Diagnosis not present

## 2019-12-24 IMAGING — MG DIGITAL SCREENING BILAT W/ TOMO W/ CAD
8 series · 8 of 24 positions shown · non-contrast
Comparison: Previous exam(s).

CLINICAL DATA: Screening.

EXAM:
DIGITAL SCREENING BILATERAL MAMMOGRAM WITH TOMO AND CAD

[L CC synth-2D]
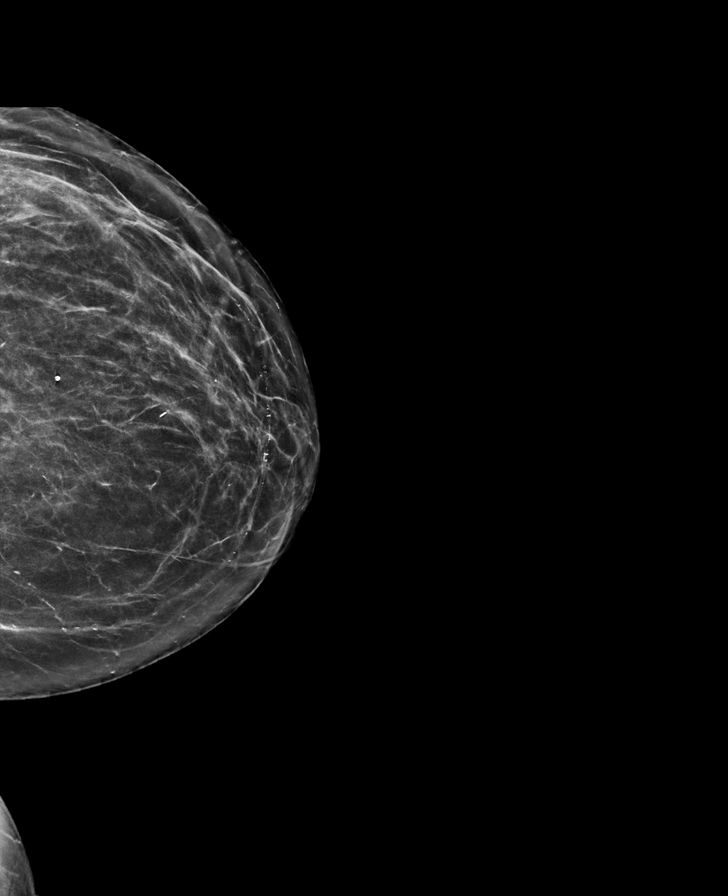

[R CC synth-2D]
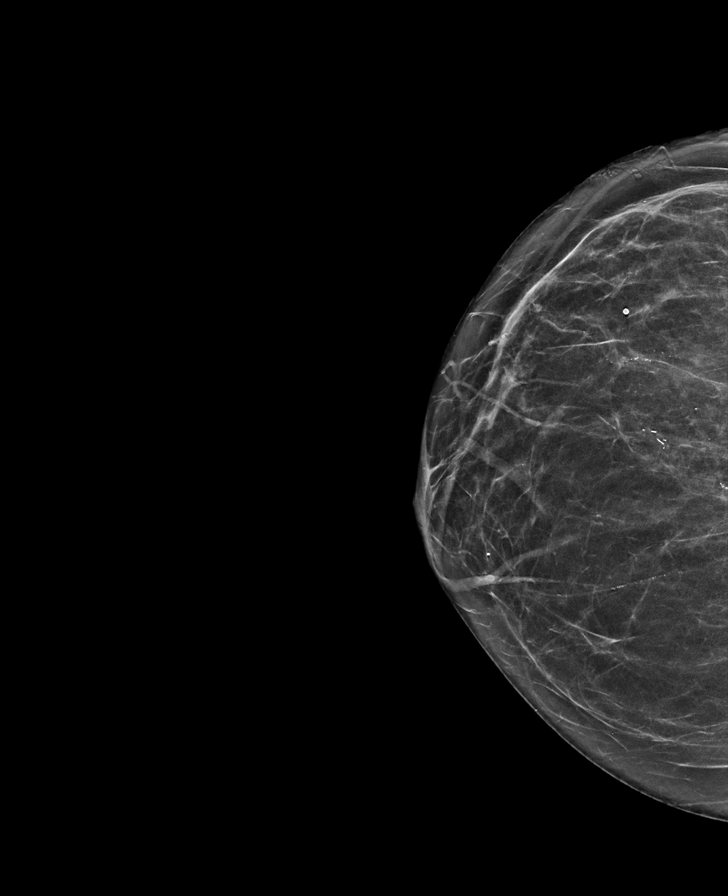

[L MLO synth-2D]
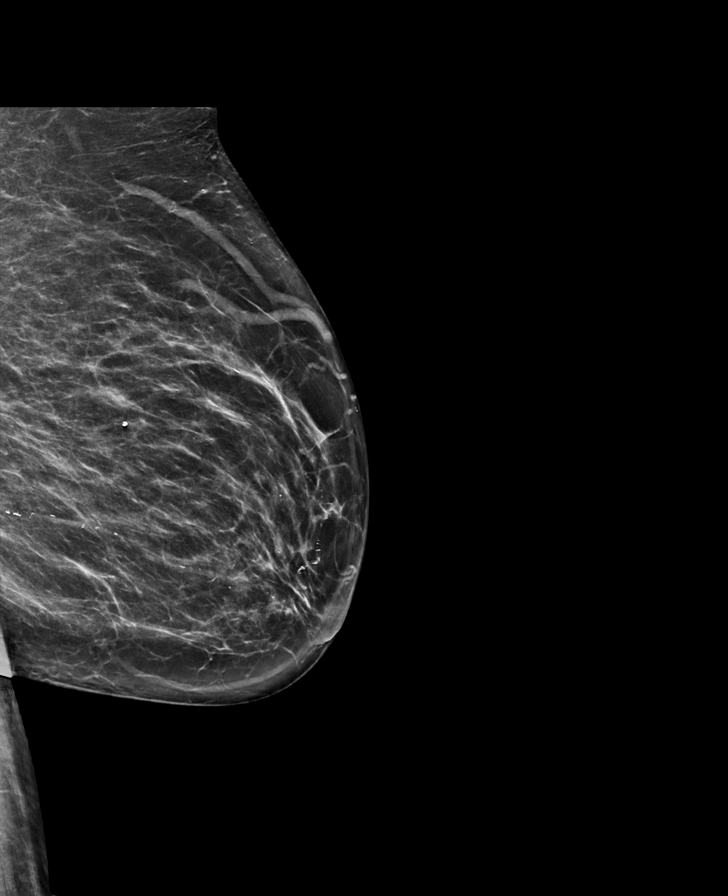

[R MLO synth-2D]
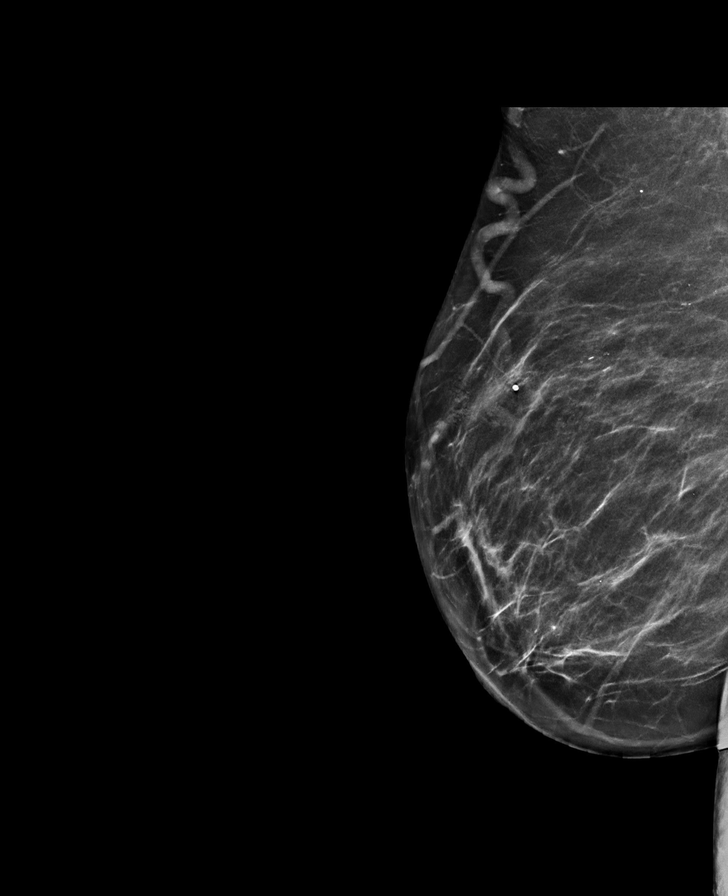

[R CC tomo · tomo slice 30/59.0]
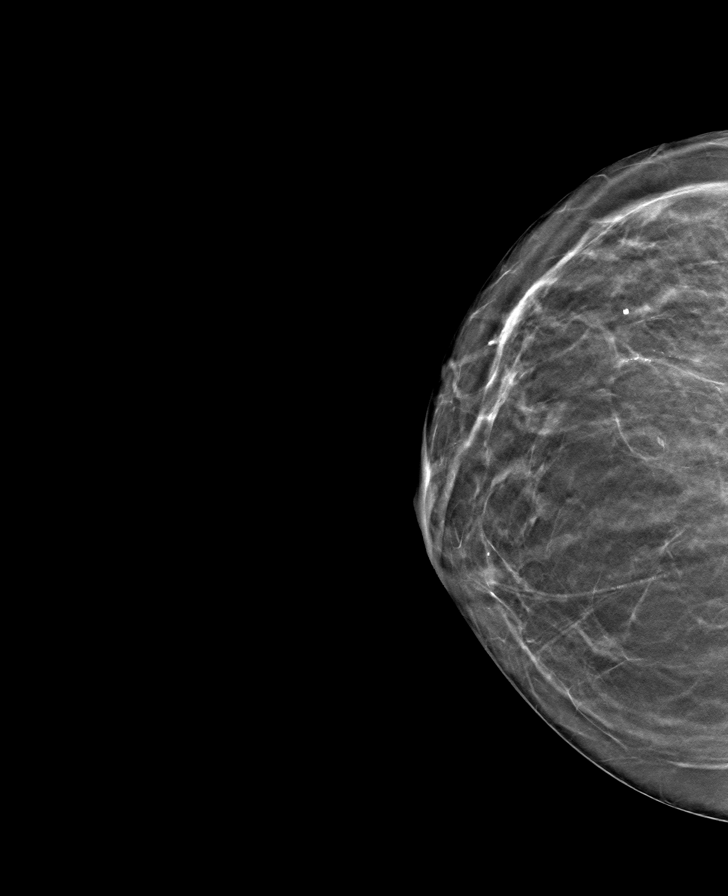

[L MLO tomo · tomo slice 37/72.0]
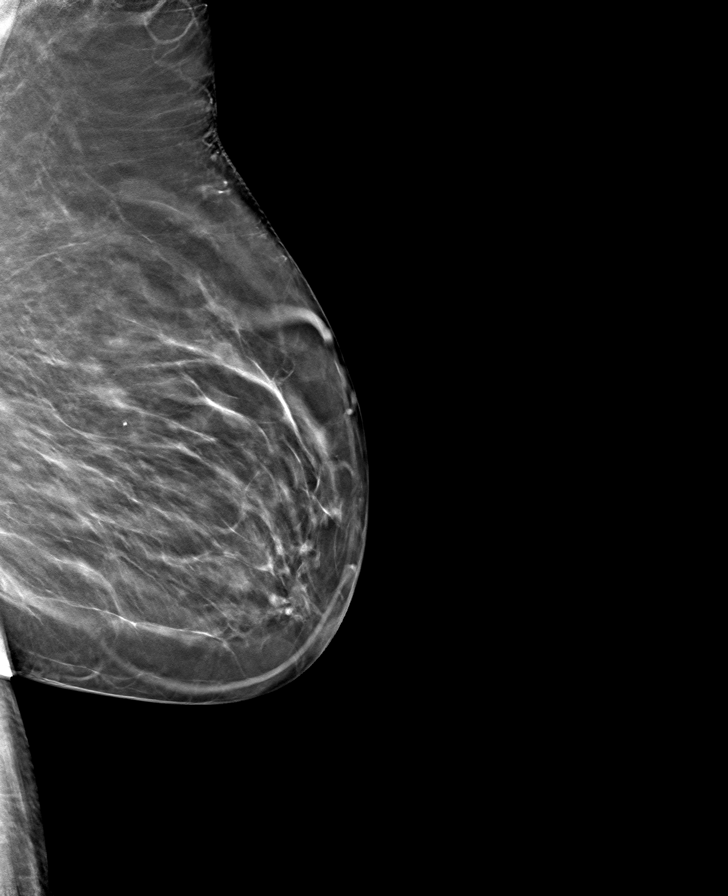

[L CC tomo · tomo slice 34/67.0]
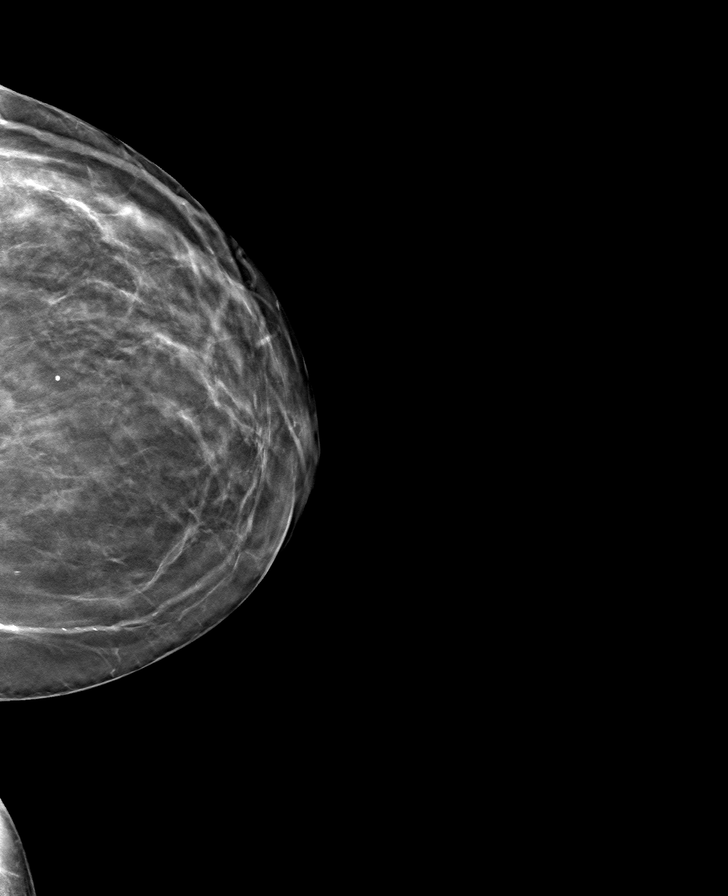

[R MLO tomo · tomo slice 36/71.0]
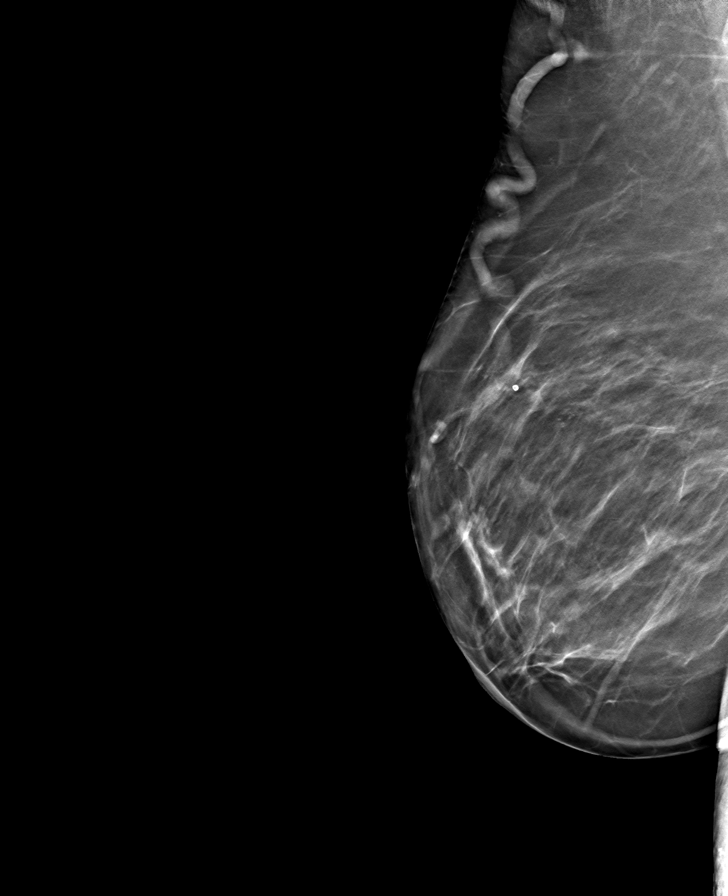

[8 of 24 positions shown; findings below may reference images not displayed]

ACR Breast Density Category b: There are scattered areas of
fibroglandular density.
FINDINGS: There are no findings suspicious for malignancy. Images were
processed with CAD.
IMPRESSION: No mammographic evidence of malignancy. A result letter of this
screening mammogram will be mailed directly to the patient.

RECOMMENDATION:
Screening mammogram in one year. (Code:[id number])

BI-RADS CATEGORY  1: Negative.

## 2020-01-06 DIAGNOSIS — E7800 Pure hypercholesterolemia, unspecified: Secondary | ICD-10-CM | POA: Diagnosis not present

## 2020-01-06 DIAGNOSIS — I10 Essential (primary) hypertension: Secondary | ICD-10-CM | POA: Diagnosis not present

## 2020-01-06 DIAGNOSIS — E669 Obesity, unspecified: Secondary | ICD-10-CM | POA: Diagnosis not present

## 2020-01-06 DIAGNOSIS — Z6838 Body mass index (BMI) 38.0-38.9, adult: Secondary | ICD-10-CM | POA: Diagnosis not present

## 2020-01-06 DIAGNOSIS — E1142 Type 2 diabetes mellitus with diabetic polyneuropathy: Secondary | ICD-10-CM | POA: Diagnosis not present

## 2020-01-06 DIAGNOSIS — I4891 Unspecified atrial fibrillation: Secondary | ICD-10-CM | POA: Diagnosis not present

## 2020-01-06 DIAGNOSIS — F325 Major depressive disorder, single episode, in full remission: Secondary | ICD-10-CM | POA: Diagnosis not present

## 2020-01-06 DIAGNOSIS — E038 Other specified hypothyroidism: Secondary | ICD-10-CM | POA: Diagnosis not present

## 2020-01-08 DIAGNOSIS — I2510 Atherosclerotic heart disease of native coronary artery without angina pectoris: Secondary | ICD-10-CM | POA: Diagnosis not present

## 2020-01-08 DIAGNOSIS — E1142 Type 2 diabetes mellitus with diabetic polyneuropathy: Secondary | ICD-10-CM | POA: Diagnosis not present

## 2020-01-08 DIAGNOSIS — Z5181 Encounter for therapeutic drug level monitoring: Secondary | ICD-10-CM | POA: Diagnosis not present

## 2020-01-08 DIAGNOSIS — E039 Hypothyroidism, unspecified: Secondary | ICD-10-CM | POA: Diagnosis not present

## 2020-01-09 NOTE — Progress Notes (Signed)
Cardiology Office Note:    Date:  01/11/2020   ID:  [name] [name], DOB 30-Oct-1946, MRN [id number]  PCP:  Greig Right, MD  Cardiologist:  Shirlee More, MD    Referring MD: Greig Right, MD    ASSESSMENT:    1. Chronic atrial fibrillation ([place])   2. Presence of Watchman left atrial appendage closure device   3. Mild CAD   4. Hypertensive heart disease with chronic diastolic congestive heart failure ([place])   5. Mixed hyperlipidemia   6. Idiopathic cirrhosis (HCC)    PLAN:    In order of problems listed above:  1. Stable is controlled continue carvedilol and not anticoagulated after watchman left atrial appendage occlusion 2. Stable CAD having no anginal discomfort continue treatment including beta-blocker, nitrates and statin 3. Blood pressure at target continue current treatment she is fluid overloaded and short of breath and she will initiate a minimal dose of loop diuretic 4. Statin lipids are ideal 5. Able followed by GI, her edema probably is multifactorial and she requires additional diuretic MRA would be appropriate   Next appointment: 6 months   Medication Adjustments/Labs and Tests Ordered: Current medicines are reviewed at length with the patient today.  Concerns regarding medicines are outlined above.  No orders of the defined types were placed in this encounter.  No orders of the defined types were placed in this encounter.   Chief Complaint  Patient presents with  . Follow-up  . Atrial Fibrillation  . Congestive Heart Failure    History of Present Illness:    [name] [name] is a 74 y.o. female with a hx of chronic AF with watchman LAAO device 2017, mild non obstructive CAD, cirrhosis, hypertension and diastolic heart failure   last seen 07/05/2019. Compliance with diet, lifestyle and medications: Yes  Her problem problems chronic pain syndrome despite taking multiple medications continues to have severe diffuse joint and muscle pain she is not on a  diuretic she has peripheral edema and has exertional shortness of breath.  Low-dose furosemide.  Recent labs performed with her primary care physician 01/06/2020 showed cholesterol 170 LDL 46 HDL 39 lipids at target on statin A1c 6.7 good and creatinine 1.16. Had no palpitations syncope angina orthopnea.  Not on antiplatelet therapy because of her liver disease Past Medical History:  Diagnosis Date  . Allergic rhinitis   . Anxiety   . ANXIETY 12/26/2007   Qualifier: Diagnosis of  By: Ronnald Ramp CNA/MA, Janett Billow    . Arthritis   . Bronchitis   . CHF (congestive heart failure) ([place])   . CHF, MILD 12/26/2007   Qualifier: Diagnosis of  By: Ronnald Ramp CNA/MA, Janett Billow    . Chronic atrial fibrillation (HCC) 04/24/2016   Watchman atrial appendage device [facility] for clot pevention  . Chronic liver disease 03/15/2016  . Complication of anesthesia    low blood pressure once  . Depression   . Enlarged heart   . Essential hypertension 12/26/2007   Qualifier: Diagnosis of  By: Ronnald Ramp CNA/MA, Janett Billow    . Fibromyalgia   . GERD (gastroesophageal reflux disease)   . HTN (hypertension)    on medication since age 61  . Hypercholesteremia   . Hyperlipidemia 02/29/2016  . Hypothyroidism 03/20/2016  . Idiopathic cirrhosis (HCC)    stage 4; sees [name] in [place]  . Mild CAD 02/29/2016  . Myocardial infarction [facility])    age 27  . Neuropathy, peripheral    lower extremities  . OSA (obstructive sleep apnea)   .  OSA on CPAP 07/04/2008   CPAP AutoSet/ [employer]   . Seasonal and perennial allergic rhinitis 01/23/2011   Allergy vaccine restarted at 1:50 08/02/2011 [place], [place] 2016   . Sleep apnea   . Somnolence   . SOMNOLENCE 12/26/2007   Annotation: excessive daytime Qualifier: Diagnosis of  By: Ronnald Ramp CNA/MA, Janett Billow    . Splenomegaly   . Type II or unspecified type diabetes mellitus without mention of complication, not stated as uncontrolled   . Urinary incontinence, nocturnal enuresis   . Urinary, incontinence, stress  female    wears depends    Past Surgical History:  Procedure Laterality Date  . ABDOMINAL HYSTERECTOMY    . APPENDECTOMY    . BACK SURGERY    . CARPAL TUNNEL RELEASE     bilaterally  . CATARACT EXTRACTION W/ INTRAOCULAR LENS  IMPLANT, BILATERAL    . DILATION AND CURETTAGE OF UTERUS    . EYE SURGERY     cataract ext/ iol implants  . KNEE ARTHROSCOPY    . LUMBAR LAMINECTOMY  03/2011; 01/2012  . MOUTH SURGERY    . SPLENECTOMY    . TONSILLECTOMY AND ADENOIDECTOMY    . TOTAL ABDOMINAL HYSTERECTOMY      Current Medications: Current Meds  Medication Sig  . acarbose (PRECOSE) 100 MG tablet Take 100 mg by mouth 3 (three) times daily with meals.  Marland Kitchen alendronate (FOSAMAX) 70 MG tablet Take 70 mg by mouth once a week.   Marland Kitchen amoxicillin (AMOXIL) 500 MG capsule TAKE 4 CAPSULES 1 HOUR PRIOR TO DENTAL APPOINTMENT.  Marland Kitchen buPROPion (WELLBUTRIN XL) 300 MG 24 hr tablet Take 300 mg by mouth daily.  . carvedilol (COREG) 25 MG tablet Take 25 mg by mouth 2 (two) times daily.  . diflunisal (DOLOBID) 500 MG TABS Take 1 tablet by mouth 2 (two) times daily.  Marland Kitchen doxazosin (CARDURA) 4 MG tablet TAKE 1 TABLET BY MOUTH DAILY.  . ergocalciferol (VITAMIN D2) 50000 UNITS capsule Take 50,000 Units by mouth once a week. Monday  . gabapentin (NEURONTIN) 600 MG tablet Take 600 mg by mouth 3 (three) times daily.   . irbesartan (AVAPRO) 300 MG tablet Take 300 mg by mouth daily.  . isosorbide mononitrate (IMDUR) 60 MG 24 hr tablet Take 60 mg by mouth daily.  Marland Kitchen levothyroxine (SYNTHROID, LEVOTHROID) 125 MCG tablet Take 125 mcg by mouth daily before breakfast.   . metFORMIN (GLUCOPHAGE-XR) 500 MG 24 hr tablet Take 500 mg by mouth 2 (two) times daily.   Marland Kitchen NITROSTAT 0.4 MG SL tablet Place 0.4 mg under the tongue every 5 (five) minutes as needed for chest pain.   Marland Kitchen omeprazole (PRILOSEC) 40 MG capsule Take 1 capsule by mouth 2 (two) times daily.  . potassium chloride SA (K-DUR,KLOR-CON) 20 MEQ tablet Take 40 mEq by mouth 3 (three)  times daily.   . rosuvastatin (CRESTOR) 5 MG tablet TAKE 1 TABLET BY MOUTH DAILY AT 6 PM.  . TOVIAZ 4 MG TB24 tablet Take 1 tablet by mouth daily.  . traMADol (ULTRAM) 50 MG tablet Take 50 mg by mouth every 6 (six) hours as needed. For pain  . trimethoprim (TRIMPEX) 100 MG tablet Take 100 mg by mouth daily.  Marland Kitchen venlafaxine XR (EFFEXOR-XR) 150 MG 24 hr capsule Take 150 mg by mouth daily with breakfast.      Allergies:   Azithromycin, Cefuroxime axetil, Celecoxib, Codeine, Sulfa antibiotics, Amlodipine, Hydralazine hcl, and Norvasc [amlodipine besylate]   Social History   Socioeconomic History  . Marital status:  Married    Spouse name: Not on file  . Number of children: Not on file  . Years of education: Not on file  . Highest education level: Not on file  Occupational History  . Not on file  Tobacco Use  . Smoking status: Never Smoker  . Smokeless tobacco: Never Used  Substance and Sexual Activity  . Alcohol use: No  . Drug use: No  . Sexual activity: Not Currently  Other Topics Concern  . Not on file  Social History Narrative  . Not on file   Social Determinants of Health   Financial Resource Strain:   . Difficulty of Paying Living Expenses: Not on file  Food Insecurity:   . Worried About Charity fundraiser in the Last Year: Not on file  . Ran Out of Food in the Last Year: Not on file  Transportation Needs:   . Lack of Transportation (Medical): Not on file  . Lack of Transportation (Non-Medical): Not on file  Physical Activity:   . Days of Exercise per Week: Not on file  . Minutes of Exercise per Session: Not on file  Stress:   . Feeling of Stress : Not on file  Social Connections:   . Frequency of Communication with Friends and Family: Not on file  . Frequency of Social Gatherings with Friends and Family: Not on file  . Attends Religious Services: Not on file  . Active Member of Clubs or Organizations: Not on file  . Attends Archivist Meetings: Not on  file  . Marital Status: Not on file     Family History: The patient's family history includes Anesthesia problems in her mother; CAD in her father; Cancer in her mother; Diabetes in her paternal grandmother; Emphysema in her mother; Heart attack in her father; Hypertension in her father; Rheum arthritis in her mother; Stroke in her father. ROS:   Please see the history of present illness.    All other systems reviewed and are negative.  EKGs/Labs/Other Studies Reviewed:    The following studies were reviewed today:  EKG:  EKG ordered today and personally reviewed.  The ekg ordered today demonstrates atrial fibrillation controlled rate low voltage nonspecific T waves  Recent Labs:  10/19/2019: Creatinine 1.14 Hemoglobin A1c 5.8% Cholesterol 122 triglyceride 144 HDL 38 LDL 56 TSH 1.73 normal 01/20/2019: BUN 15; Creatinine, Ser 0.87; Potassium 4.1; Sodium 133  Recent Lipid Panel    Component Value Date/Time   CHOL 117 12/24/2018 1137   TRIG 151 (H) 12/24/2018 1137   HDL 39 (L) 12/24/2018 1137   CHOLHDL 3.0 12/24/2018 1137   LDLCALC 48 12/24/2018 1137    Physical Exam:    VS:  BP 134/70   Pulse 72   Temp (!) 96.8 F (36 C)   Ht 5\' 3"  (1.6 m)   Wt 215 lb 3.2 oz (97.6 kg)   SpO2 96%   BMI 38.12 kg/m     Wt Readings from Last 3 Encounters:  01/11/20 215 lb 3.2 oz (97.6 kg)  07/29/19 215 lb 12.8 oz (97.9 kg)  07/08/19 212 lb 9.6 oz (96.4 kg)     GEN:  Well nourished, well developed in no acute distress HEENT: Normal NECK: No JVD; No carotid bruits LYMPHATICS: No lymphadenopathy CARDIAC: Irregular S1 variable no murmurs, rubs, gallops RESPIRATORY:  Clear to auscultation without rales, wheezing or rhonchi  ABDOMEN: Soft, non-tender, non-distended MUSCULOSKELETAL: 1-2+ bilateral lower extremity pitting edema; No deformity  SKIN: Warm and dry NEUROLOGIC:  Alert and oriented x 3 PSYCHIATRIC:  Normal affect    Signed, Shirlee More, MD  01/11/2020 2:58 PM    [place]

## 2020-01-11 ENCOUNTER — Encounter: Payer: Self-pay | Admitting: Cardiology

## 2020-01-11 ENCOUNTER — Ambulatory Visit (INDEPENDENT_AMBULATORY_CARE_PROVIDER_SITE_OTHER): Payer: Medicare Other | Admitting: Cardiology

## 2020-01-11 ENCOUNTER — Other Ambulatory Visit: Payer: Self-pay

## 2020-01-11 VITALS — BP 134/70 | HR 72 | Temp 96.8°F | Ht 63.0 in | Wt 215.2 lb

## 2020-01-11 DIAGNOSIS — I5032 Chronic diastolic (congestive) heart failure: Secondary | ICD-10-CM | POA: Diagnosis not present

## 2020-01-11 DIAGNOSIS — I2510 Atherosclerotic heart disease of native coronary artery without angina pectoris: Secondary | ICD-10-CM | POA: Diagnosis not present

## 2020-01-11 DIAGNOSIS — I110 Hypertensive heart disease with heart failure: Secondary | ICD-10-CM

## 2020-01-11 DIAGNOSIS — Z95818 Presence of other cardiac implants and grafts: Secondary | ICD-10-CM

## 2020-01-11 DIAGNOSIS — E782 Mixed hyperlipidemia: Secondary | ICD-10-CM

## 2020-01-11 DIAGNOSIS — I482 Chronic atrial fibrillation, unspecified: Secondary | ICD-10-CM

## 2020-01-11 DIAGNOSIS — K7460 Unspecified cirrhosis of liver: Secondary | ICD-10-CM | POA: Diagnosis not present

## 2020-01-11 MED ORDER — FUROSEMIDE 20 MG PO TABS: ORAL_TABLET | ORAL | 3 refills | Status: DC

## 2020-01-11 NOTE — Patient Instructions (Addendum)
Medication Instructions:  Your physician has recommended you make the following change in your medication:  1.  START Lasix 20 mg taking 1 tablet daily for 2 days then going to only MONDAYS, [place]   *If you need a refill on your cardiac medications before your next appointment, please call your pharmacy*  Lab Work: None ordered  If you have labs (blood work) drawn today and your tests are completely normal, you will receive your results only by: Marland Kitchen MyChart Message (if you have MyChart) OR . A paper copy in the mail If you have any lab test that is abnormal or we need to change your treatment, we will call you to review the results.  Testing/Procedures: None ordered  Follow-Up: At [facility], you and your health needs are our priority.  As part of our continuing mission to provide you with exceptional heart care, we have created designated Provider Care Teams.  These Care Teams include your primary Cardiologist (physician) and Advanced Practice Providers (APPs -  Physician Assistants and Nurse Practitioners) who all work together to provide you with the care you need, when you need it.  Your next appointment:   6 month(s)  The format for your next appointment:   In Person  Provider:   Shirlee More, MD  Other Instructions

## 2020-01-19 DIAGNOSIS — D649 Anemia, unspecified: Secondary | ICD-10-CM | POA: Diagnosis not present

## 2020-01-19 DIAGNOSIS — K8020 Calculus of gallbladder without cholecystitis without obstruction: Secondary | ICD-10-CM | POA: Diagnosis not present

## 2020-01-19 DIAGNOSIS — K7460 Unspecified cirrhosis of liver: Secondary | ICD-10-CM | POA: Diagnosis not present

## 2020-01-19 DIAGNOSIS — R161 Splenomegaly, not elsewhere classified: Secondary | ICD-10-CM | POA: Diagnosis not present

## 2020-01-19 DIAGNOSIS — K8080 Other cholelithiasis without obstruction: Secondary | ICD-10-CM | POA: Diagnosis not present

## 2020-01-20 ENCOUNTER — Other Ambulatory Visit: Payer: Self-pay | Admitting: Cardiology

## 2020-01-22 ENCOUNTER — Ambulatory Visit (INDEPENDENT_AMBULATORY_CARE_PROVIDER_SITE_OTHER): Payer: Medicare Other | Admitting: Internal Medicine

## 2020-01-22 ENCOUNTER — Encounter: Payer: Self-pay | Admitting: Internal Medicine

## 2020-01-22 ENCOUNTER — Other Ambulatory Visit: Payer: Self-pay

## 2020-01-22 VITALS — BP 118/62 | HR 62 | Temp 97.5°F | Ht 63.0 in | Wt 214.0 lb

## 2020-01-22 DIAGNOSIS — G4733 Obstructive sleep apnea (adult) (pediatric): Secondary | ICD-10-CM

## 2020-01-22 DIAGNOSIS — I2510 Atherosclerotic heart disease of native coronary artery without angina pectoris: Secondary | ICD-10-CM

## 2020-01-22 DIAGNOSIS — K7460 Unspecified cirrhosis of liver: Secondary | ICD-10-CM | POA: Diagnosis not present

## 2020-01-22 DIAGNOSIS — D649 Anemia, unspecified: Secondary | ICD-10-CM | POA: Diagnosis not present

## 2020-01-22 NOTE — Patient Instructions (Signed)
Order- DME APS    We can continue CPAP auto 5-15, mask of choice, humidifier, supplies, AirView/ card.   Please update AirView or place card  Please call if we can help

## 2020-01-22 NOTE — Progress Notes (Signed)
HPI F never smoker, followed for OSA, chronic bronchitis, allergic rhinitis, complicated by nonalcoholic cirrhosis/[name] GI,   A. Fib/"Watchman" clot- preventing device placed in her atrial appendage , HBP, MI, chronic diastolic CHF, GERD, DM 2, hypothyroid NPSG 11/05/01-AHI 93/hour, desaturation to 63%, body weight 217 pounds PFT 07/26/09-mild restriction and reduction of diffusion. No obstruction, no response to dilator -----------------------------------------------------------------------------------   07/29/2019- 74 year old female never smoker followed for OSA, chronic bronchitis, allergic rhinitis, complicated by nonalcoholic cirrhosis/[name] GI,  AFib/"Watchman" clot- preventing device placed in her atrial appendage, HBP, MI, chronic diastolic CHF, GERD, DM 2, hypothyroid  CPAP auto 5-15/APS ------OSA on CPAP auto 5-15, DME: APS; pt states her machine is over 83 years old and that at one point her machine stopped working; however, starting working again recently [employer] compliance 67%, AHI 3.4/ hr Machine old and not functioning reliably.  Only SOB when first lies down, until she puts CPAP on.   01/22/20-  74 year old female never smoker followed for OSA, chronic bronchitis, allergic rhinitis, complicated by nonalcoholic cirrhosis/[name] GI,  AFib/"Watchman" clot- preventing device placed in her atrial appendage, HBP, MI, chronic diastolic CHF, GERD, DM 2, hypothyroid  CPAP auto 5-15/APS               Husband here [place]- Body weight today- 214 lbs Had first Covid vax. Uses CPAP any time she sleeps or naps,but still tends to feel tired. Machine replaced last year and working well.  Continuing to work with GI/ Dr Melina Copa- "liver is smaller, spleen is bigger". Comes in wheelchair because of chronic pain she relates to fibromyalgia, arthritis, and also easy DOE which is unchanged. Admits very little exercise. No acute event. Not coughing.  ROS-see HPI  + =  positive Constitutional:   No-   weight loss, night sweats, fevers, chills, + fatigue, lassitude. HEENT:   No-  headaches, difficulty swallowing, tooth/dental problems, sore throat,       No-  sneezing, itching, ear ache, nasal congestion, post nasal drip,  CV:  No-   chest pain, orthopnea, PND, swelling in lower extremities, anasarca, dizziness, palpitations Resp:+ shortness of breath with exertion or at rest.             productive cough,  No non-productive cough,  No- coughing up of blood.              No-   change in color of mucus.  No- wheezing.   Skin: No-   rash or lesions. GI:  No-   heartburn, indigestion, abdominal pain, nausea, vomiting,  GU:  MS:  + joint pain or swelling. + back pain. Neuro-     nothing unusual Psych:  No- change in mood or affect. No depression or anxiety.  No memory loss.  OBJ- Physical Exam General- Alert, Oriented, Affect-appropriate, Distress- none acute,   +wheelchair +morbid obesity Skin-  + ecchymoses on arms Lymphadenopathy- none Head- atraumatic            Eyes- Gross vision intact, PERRLA, conjunctivae and secretions clear            Ears- Hearing, canals-normal            Nose- Clear, no-Septal dev, mucus, polyps, erosion, perforation             Throat- Mallampati II-III , mucosa clear , drainage- none, tonsils- atrophic Neck- flexible , trachea midline, no stridor , thyroid nl, carotid no bruit Chest - symmetrical excursion , unlabored  Heart/CV- IRR/Afib , no murmur , no gallop  , no rub, nl s1 s2                           - JVD- none , edema- none, stasis changes +, varices- none           Lung- clear to P&A, wheeze- none, cough- none , dullness-none, rub- none           Chest wall-  Abd-  Br/ Gen/ Rectal- Not done, not indicated Extrem- cyanosis- none, clubbing, none, atrophy- none, strength- nl.  Neuro- grossly intact to observation

## 2020-01-25 NOTE — Assessment & Plan Note (Signed)
Managed by GI 

## 2020-01-25 NOTE — Assessment & Plan Note (Addendum)
Continues to benefit and reports good compliance. Residual fatigue and weakness reflect co-morbidities Need APS to replace download card

## 2020-01-26 DIAGNOSIS — K219 Gastro-esophageal reflux disease without esophagitis: Secondary | ICD-10-CM | POA: Diagnosis not present

## 2020-01-26 DIAGNOSIS — Z86010 Personal history of colonic polyps: Secondary | ICD-10-CM | POA: Diagnosis not present

## 2020-01-26 DIAGNOSIS — K7460 Unspecified cirrhosis of liver: Secondary | ICD-10-CM | POA: Diagnosis not present

## 2020-01-26 DIAGNOSIS — D649 Anemia, unspecified: Secondary | ICD-10-CM | POA: Diagnosis not present

## 2020-02-10 ENCOUNTER — Other Ambulatory Visit: Payer: Self-pay | Admitting: Cardiology

## 2020-03-14 ENCOUNTER — Ambulatory Visit (INDEPENDENT_AMBULATORY_CARE_PROVIDER_SITE_OTHER): Payer: Medicare Other

## 2020-03-14 ENCOUNTER — Other Ambulatory Visit: Payer: Self-pay | Admitting: Podiatry

## 2020-03-14 ENCOUNTER — Ambulatory Visit (INDEPENDENT_AMBULATORY_CARE_PROVIDER_SITE_OTHER): Payer: Medicare Other | Admitting: Podiatry

## 2020-03-14 ENCOUNTER — Other Ambulatory Visit: Payer: Self-pay

## 2020-03-14 DIAGNOSIS — M79672 Pain in left foot: Secondary | ICD-10-CM

## 2020-03-14 DIAGNOSIS — S99922A Unspecified injury of left foot, initial encounter: Secondary | ICD-10-CM | POA: Diagnosis not present

## 2020-03-14 DIAGNOSIS — L600 Ingrowing nail: Secondary | ICD-10-CM

## 2020-03-14 DIAGNOSIS — M79671 Pain in right foot: Secondary | ICD-10-CM

## 2020-03-14 DIAGNOSIS — M79676 Pain in unspecified toe(s): Secondary | ICD-10-CM

## 2020-03-14 NOTE — Progress Notes (Signed)
  Subjective:  Patient ID: [name] [name], female    DOB: September 30, 1946,  MRN: [id number]  No chief complaint on file.   74 y.o. female presents for injury to the left 2nd to. States she stumped her toe on a chair about a week and a half ago. States the whole toe hurts and is sore. The nail was bleeding and red, denies sweling. Using tramadol for pain.  Objective:  Physical Exam: warm, good capillary refill, no trophic changes or ulcerative lesions, slightly decreased DP and PT pulses and normal sensory exam. Left Foot: 2nd toenail loosening, lysis, dystrophy. 2nd hammertoe with pain to palpation about the digit   No images are attached to the encounter.  Radiographs: X-ray of the left foot: no fracture, dislocation, swelling or degenerative changes noted Assessment:   1. Ingrown nail   2. Pain around toenail   3. Toe injury, left, initial encounter      Plan:  Patient was evaluated and treated and all questions answered.  Left 2nd toe injury with traumatic nail partial lysis -XR reviewed -Discussed removal the nail today. Patient agrees to proceed. No tourniquet was used given slight PAD   Procedure: Avulsion of toenail Location: Left 2nd toe  Anesthesia: Lidocaine 1% plain; 1.5 mL and Marcaine 0.5% plain; 1.5 mL, digital block. Skin Prep: Betadine. Dressing: Silvadene; telfa; dry, sterile, compression dressing. Technique: Following skin prep, the nail was freed and avulsed with a hemostat. The area was cleansed. The tourniquet was then removed and sterile dressing applied. Disposition: Patient tolerated procedure well.    No follow-ups on file.

## 2020-03-18 ENCOUNTER — Other Ambulatory Visit: Payer: Self-pay | Admitting: Podiatry

## 2020-03-18 DIAGNOSIS — S99922A Unspecified injury of left foot, initial encounter: Secondary | ICD-10-CM

## 2020-03-29 ENCOUNTER — Other Ambulatory Visit: Payer: Self-pay

## 2020-03-29 ENCOUNTER — Ambulatory Visit (INDEPENDENT_AMBULATORY_CARE_PROVIDER_SITE_OTHER): Payer: Medicare Other | Admitting: Podiatry

## 2020-03-29 DIAGNOSIS — L600 Ingrowing nail: Secondary | ICD-10-CM | POA: Diagnosis not present

## 2020-04-04 DIAGNOSIS — E1142 Type 2 diabetes mellitus with diabetic polyneuropathy: Secondary | ICD-10-CM | POA: Diagnosis not present

## 2020-04-04 DIAGNOSIS — E038 Other specified hypothyroidism: Secondary | ICD-10-CM | POA: Diagnosis not present

## 2020-04-04 DIAGNOSIS — F325 Major depressive disorder, single episode, in full remission: Secondary | ICD-10-CM | POA: Diagnosis not present

## 2020-04-04 DIAGNOSIS — M150 Primary generalized (osteo)arthritis: Secondary | ICD-10-CM | POA: Diagnosis not present

## 2020-04-04 DIAGNOSIS — E1122 Type 2 diabetes mellitus with diabetic chronic kidney disease: Secondary | ICD-10-CM | POA: Diagnosis not present

## 2020-04-04 DIAGNOSIS — E113293 Type 2 diabetes mellitus with mild nonproliferative diabetic retinopathy without macular edema, bilateral: Secondary | ICD-10-CM | POA: Diagnosis not present

## 2020-04-04 DIAGNOSIS — I10 Essential (primary) hypertension: Secondary | ICD-10-CM | POA: Diagnosis not present

## 2020-04-04 DIAGNOSIS — R3915 Urgency of urination: Secondary | ICD-10-CM | POA: Diagnosis not present

## 2020-04-04 DIAGNOSIS — E7800 Pure hypercholesterolemia, unspecified: Secondary | ICD-10-CM | POA: Diagnosis not present

## 2020-04-04 DIAGNOSIS — I4891 Unspecified atrial fibrillation: Secondary | ICD-10-CM | POA: Diagnosis not present

## 2020-05-03 DIAGNOSIS — L97812 Non-pressure chronic ulcer of other part of right lower leg with fat layer exposed: Secondary | ICD-10-CM | POA: Diagnosis not present

## 2020-05-03 DIAGNOSIS — E11622 Type 2 diabetes mellitus with other skin ulcer: Secondary | ICD-10-CM | POA: Diagnosis not present

## 2020-05-10 DIAGNOSIS — L97812 Non-pressure chronic ulcer of other part of right lower leg with fat layer exposed: Secondary | ICD-10-CM | POA: Diagnosis not present

## 2020-05-10 DIAGNOSIS — E11622 Type 2 diabetes mellitus with other skin ulcer: Secondary | ICD-10-CM | POA: Diagnosis not present

## 2020-05-22 NOTE — Progress Notes (Signed)
  Subjective:  Patient ID: [name] [name], female    DOB: June 16, 1946,  MRN: [id number]  Chief Complaint  Patient presents with  . nail check    F/U LT 2nd nail checlk pt. states," it's fine." -pt dneis pain/redness/swelling/drainage Tx: none -pt dc epsom salt    74 y.o. female presents for follow up of nail procedure. History confirmed with patient.   Objective:  Physical Exam: Ingrown nail avulsion site: overlying soft crust, no warmth, no drainage and no erythema Assessment:   1. Ingrown nail      Plan:  Patient was evaluated and treated and all questions answered.  S/p Ingrown Toenail Excision, left -Healing well without issue. -Discussed return precautions. -F/u PRN

## 2020-06-09 DIAGNOSIS — N3281 Overactive bladder: Secondary | ICD-10-CM | POA: Diagnosis not present

## 2020-06-09 DIAGNOSIS — N390 Urinary tract infection, site not specified: Secondary | ICD-10-CM | POA: Diagnosis not present

## 2020-07-04 DIAGNOSIS — I10 Essential (primary) hypertension: Secondary | ICD-10-CM | POA: Diagnosis not present

## 2020-07-04 DIAGNOSIS — Z6841 Body Mass Index (BMI) 40.0 and over, adult: Secondary | ICD-10-CM | POA: Diagnosis not present

## 2020-07-04 DIAGNOSIS — E1142 Type 2 diabetes mellitus with diabetic polyneuropathy: Secondary | ICD-10-CM | POA: Diagnosis not present

## 2020-07-04 DIAGNOSIS — E113293 Type 2 diabetes mellitus with mild nonproliferative diabetic retinopathy without macular edema, bilateral: Secondary | ICD-10-CM | POA: Diagnosis not present

## 2020-07-04 DIAGNOSIS — N390 Urinary tract infection, site not specified: Secondary | ICD-10-CM | POA: Diagnosis not present

## 2020-07-13 ENCOUNTER — Other Ambulatory Visit: Payer: Self-pay | Admitting: Cardiology

## 2020-07-20 DIAGNOSIS — N390 Urinary tract infection, site not specified: Secondary | ICD-10-CM | POA: Diagnosis not present

## 2020-07-21 DIAGNOSIS — E1142 Type 2 diabetes mellitus with diabetic polyneuropathy: Secondary | ICD-10-CM | POA: Diagnosis not present

## 2020-07-21 DIAGNOSIS — E039 Hypothyroidism, unspecified: Secondary | ICD-10-CM | POA: Diagnosis not present

## 2020-07-21 DIAGNOSIS — I2510 Atherosclerotic heart disease of native coronary artery without angina pectoris: Secondary | ICD-10-CM | POA: Diagnosis not present

## 2020-08-03 DIAGNOSIS — R071 Chest pain on breathing: Secondary | ICD-10-CM | POA: Diagnosis not present

## 2020-08-22 DIAGNOSIS — D649 Anemia, unspecified: Secondary | ICD-10-CM | POA: Diagnosis not present

## 2020-08-22 DIAGNOSIS — R161 Splenomegaly, not elsewhere classified: Secondary | ICD-10-CM | POA: Diagnosis not present

## 2020-08-22 DIAGNOSIS — K8020 Calculus of gallbladder without cholecystitis without obstruction: Secondary | ICD-10-CM | POA: Diagnosis not present

## 2020-08-22 DIAGNOSIS — K7460 Unspecified cirrhosis of liver: Secondary | ICD-10-CM | POA: Diagnosis not present

## 2020-08-31 DIAGNOSIS — N3281 Overactive bladder: Secondary | ICD-10-CM | POA: Diagnosis not present

## 2020-08-31 DIAGNOSIS — N390 Urinary tract infection, site not specified: Secondary | ICD-10-CM | POA: Diagnosis not present

## 2020-09-14 DIAGNOSIS — I10 Essential (primary) hypertension: Secondary | ICD-10-CM | POA: Insufficient documentation

## 2020-09-14 DIAGNOSIS — J40 Bronchitis, not specified as acute or chronic: Secondary | ICD-10-CM | POA: Insufficient documentation

## 2020-09-14 DIAGNOSIS — Z23 Encounter for immunization: Secondary | ICD-10-CM | POA: Diagnosis not present

## 2020-09-14 DIAGNOSIS — G4730 Sleep apnea, unspecified: Secondary | ICD-10-CM | POA: Insufficient documentation

## 2020-09-14 DIAGNOSIS — I509 Heart failure, unspecified: Secondary | ICD-10-CM | POA: Insufficient documentation

## 2020-09-15 DIAGNOSIS — K219 Gastro-esophageal reflux disease without esophagitis: Secondary | ICD-10-CM | POA: Diagnosis not present

## 2020-09-15 DIAGNOSIS — D649 Anemia, unspecified: Secondary | ICD-10-CM | POA: Diagnosis not present

## 2020-09-15 DIAGNOSIS — K7460 Unspecified cirrhosis of liver: Secondary | ICD-10-CM | POA: Diagnosis not present

## 2020-09-15 DIAGNOSIS — Z1212 Encounter for screening for malignant neoplasm of rectum: Secondary | ICD-10-CM | POA: Diagnosis not present

## 2020-09-19 ENCOUNTER — Encounter: Payer: Self-pay | Admitting: Cardiology

## 2020-09-19 ENCOUNTER — Ambulatory Visit (INDEPENDENT_AMBULATORY_CARE_PROVIDER_SITE_OTHER): Payer: Medicare Other | Admitting: Cardiology

## 2020-09-19 ENCOUNTER — Other Ambulatory Visit: Payer: Self-pay

## 2020-09-19 VITALS — BP 150/80 | HR 62 | Ht 63.0 in | Wt 218.4 lb

## 2020-09-19 DIAGNOSIS — I5032 Chronic diastolic (congestive) heart failure: Secondary | ICD-10-CM | POA: Diagnosis not present

## 2020-09-19 DIAGNOSIS — I2510 Atherosclerotic heart disease of native coronary artery without angina pectoris: Secondary | ICD-10-CM

## 2020-09-19 DIAGNOSIS — E782 Mixed hyperlipidemia: Secondary | ICD-10-CM

## 2020-09-19 DIAGNOSIS — K7460 Unspecified cirrhosis of liver: Secondary | ICD-10-CM

## 2020-09-19 DIAGNOSIS — Z95818 Presence of other cardiac implants and grafts: Secondary | ICD-10-CM

## 2020-09-19 DIAGNOSIS — I110 Hypertensive heart disease with heart failure: Secondary | ICD-10-CM | POA: Diagnosis not present

## 2020-09-19 DIAGNOSIS — I482 Chronic atrial fibrillation, unspecified: Secondary | ICD-10-CM

## 2020-09-19 NOTE — Progress Notes (Signed)
Cardiology Office Note:    Date:  09/19/2020   ID:  [name] [name], DOB 1945/11/22, MRN [id number]  PCP:  Greig Right, MD  Cardiologist:  Shirlee More, MD    Referring MD: Greig Right, MD    ASSESSMENT:    1. Chronic atrial fibrillation ([place])   2. Presence of Watchman left atrial appendage closure device   3. Hypertensive heart disease with chronic diastolic congestive heart failure ([place])   4. Mild CAD   5. Mixed hyperlipidemia   6. Idiopathic cirrhosis (HCC)    PLAN:    In order of problems listed above:  1. Stable rate is controlled continue beta-blocker and she has had a watchman device and is not anticoagulated 2. Heart failure is decompensated she is edematous and is agreed to resume her furosemide BP at target. 3. Stable CAD continue medical therapy Lipids are ideal continue statin Followed by GI  Next appointment: 6 months   Medication Adjustments/Labs and Tests Ordered: Current medicines are reviewed at length with the patient today.  Concerns regarding medicines are outlined above.  Orders Placed This Encounter  Procedures  . EKG 12-Lead   No orders of the defined types were placed in this encounter.   No chief complaint on file.   History of Present Illness:    [name] [name] is a 74 y.o. female with a hx of chronic atrial fibrillation with watchman left atrial appendage occlusion device 2017, mild known nonobstructive CAD, cirrhosis hypertension and diastolic heart failure last seen 01/11/2020. Compliance with diet, lifestyle and medications: No, she has not been taking her diuretic because of urinary infection and frequency  Off diuretic she notices edema and exertional shortness of breath.  No orthopnea.  She has had one episode of chest pain sharp not requiring nitroglycerin.  She had a duplex abdominal 08/22/2020 showing cholelithiasis without evidence of cholecystitis no ascites and findings of hepatic cirrhosis and mild splenomegaly Past Medical  History:  Diagnosis Date  . Allergic rhinitis   . Anxiety   . ANXIETY 12/26/2007   Qualifier: Diagnosis of  By: Ronnald Ramp CNA/MA, Janett Billow    . Arthritis   . Bronchitis   . Chest pain in adult 12/24/2018  . CHF (congestive heart failure) ([place])   . CHF, MILD 12/26/2007   Qualifier: Diagnosis of  By: Ronnald Ramp CNA/MA, Janett Billow    . Chronic atrial fibrillation (HCC) 04/24/2016   Watchman atrial appendage device [facility] for clot pevention  . Chronic diastolic heart failure ([place])   . Chronic liver disease 03/15/2016  . Closed fracture of proximal tibia 03/13/2018  . Complication of anesthesia    low blood pressure once  . Depression   . Enlarged heart   . Esophageal reflux 07/04/2017  . Essential hypertension 12/26/2007   Qualifier: Diagnosis of  By: Ronnald Ramp CNA/MA, Janett Billow    . Fibromyalgia   . GERD (gastroesophageal reflux disease)   . HTN (hypertension)    on medication since age 89  . Hypercholesteremia   . Hyperlipidemia 02/29/2016  . Hypertensive heart disease    on medication since age 55  . Hypothyroidism 03/20/2016  . Idiopathic cirrhosis (HCC)    stage 4; sees [name] in [place]  . Mild CAD 02/29/2016  . Myocardial infarction [facility])    age 50  . Neuropathy, peripheral    lower extremities  . OSA (obstructive sleep apnea)   . OSA on CPAP 07/04/2008   CPAP [facility]/ [facility]   . Presence of Watchman left atrial appendage closure  device 01/30/2018  . Seasonal and perennial allergic rhinitis 01/23/2011   Allergy vaccine restarted at 1:50 08/02/2011 [place], [place] 2016   . Simple chronic bronchitis ([place]) 12/26/2007   PFT 07/26/09-mild restriction and reduction of diffusion. No obstruction, no response to dilator   . Sleep apnea   . Somnolence   . SOMNOLENCE 12/26/2007   Annotation: excessive daytime Qualifier: Diagnosis of  By: Ronnald Ramp CNA/MA, Janett Billow    . Splenomegaly   . Swelling of joint, knee, right 01/30/2018  . Type 2 diabetes mellitus ([place]) 12/29/2007   Qualifier: Diagnosis of  By: Annamaria Boots MD, [place]  D   . Type 2 diabetes mellitus without complications ([place])   . Type II or unspecified type diabetes mellitus without mention of complication, not stated as uncontrolled   . Urinary incontinence, nocturnal enuresis   . Urinary, incontinence, stress female    wears depends    Past Surgical History:  Procedure Laterality Date  . ABDOMINAL HYSTERECTOMY    . APPENDECTOMY    . BACK SURGERY    . CARPAL TUNNEL RELEASE     bilaterally  . CATARACT EXTRACTION W/ INTRAOCULAR LENS  IMPLANT, BILATERAL    . DILATION AND CURETTAGE OF UTERUS    . EYE SURGERY     cataract ext/ iol implants  . KNEE ARTHROSCOPY    . LUMBAR LAMINECTOMY  03/2011; 01/2012  . MOUTH SURGERY    . SPLENECTOMY    . TONSILLECTOMY AND ADENOIDECTOMY    . TOTAL ABDOMINAL HYSTERECTOMY      Current Medications: Current Meds  Medication Sig  . acarbose (PRECOSE) 100 MG tablet Take 100 mg by mouth 3 (three) times daily with meals.  Marland Kitchen alendronate (FOSAMAX) 70 MG tablet Take 70 mg by mouth once a week.   Marland Kitchen amoxicillin (AMOXIL) 500 MG capsule TAKE 4 CAPSULES 1 HOUR PRIOR TO DENTAL APPOINTMENT.  Marland Kitchen buPROPion (WELLBUTRIN XL) 300 MG 24 hr tablet Take 300 mg by mouth daily.  . carvedilol (COREG) 25 MG tablet Take 25 mg by mouth 2 (two) times daily.  . diflunisal (DOLOBID) 500 MG TABS Take 1 tablet by mouth 2 (two) times daily.  Marland Kitchen doxazosin (CARDURA) 4 MG tablet TAKE 1 TABLET BY MOUTH DAILY  . ergocalciferol (VITAMIN D2) 50000 UNITS capsule Take 50,000 Units by mouth once a week. Monday  . furosemide (LASIX) 20 MG tablet Take 1 tablet by mouth X's 2 days then take only on Mondays, Wednesdays and Fridays  . gabapentin (NEURONTIN) 600 MG tablet Take 600 mg by mouth 3 (three) times daily.   . irbesartan (AVAPRO) 300 MG tablet Take 300 mg by mouth daily.  . isosorbide mononitrate (IMDUR) 60 MG 24 hr tablet Take 60 mg by mouth daily.  Marland Kitchen levothyroxine (SYNTHROID, LEVOTHROID) 125 MCG tablet Take 125 mcg by mouth daily before breakfast.   .  metFORMIN (GLUCOPHAGE-XR) 500 MG 24 hr tablet Take 500 mg by mouth 2 (two) times daily.   Marland Kitchen NITROSTAT 0.4 MG SL tablet Place 0.4 mg under the tongue every 5 (five) minutes as needed for chest pain.   Marland Kitchen omeprazole (PRILOSEC) 40 MG capsule Take 1 capsule by mouth 2 (two) times daily.  . potassium chloride SA (K-DUR,KLOR-CON) 20 MEQ tablet Take 40 mEq by mouth 3 (three) times daily.   . rosuvastatin (CRESTOR) 5 MG tablet TAKE 1 TABLET BY MOUTH DAILY AT 6 PM.  . TOVIAZ 4 MG TB24 tablet Take 1 tablet by mouth daily.  . traMADol (ULTRAM) 50 MG tablet Take 50  mg by mouth every 6 (six) hours as needed. For pain  . venlafaxine XR (EFFEXOR-XR) 150 MG 24 hr capsule Take 150 mg by mouth daily with breakfast.      Allergies:   Azithromycin, Cefuroxime axetil, Celecoxib, Codeine, Sulfa antibiotics, Amlodipine, Hydralazine hcl, and Norvasc [amlodipine besylate]   Social History   Socioeconomic History  . Marital status: Married    Spouse name: Not on file  . Number of children: Not on file  . Years of education: Not on file  . Highest education level: Not on file  Occupational History  . Not on file  Tobacco Use  . Smoking status: Never Smoker  . Smokeless tobacco: Never Used  Vaping Use  . Vaping Use: Never used  Substance and Sexual Activity  . Alcohol use: No  . Drug use: No  . Sexual activity: Not Currently  Other Topics Concern  . Not on file  Social History Narrative  . Not on file   Social Determinants of Health   Financial Resource Strain:   . Difficulty of Paying Living Expenses: Not on file  Food Insecurity:   . Worried About Charity fundraiser in the Last Year: Not on file  . Ran Out of Food in the Last Year: Not on file  Transportation Needs:   . Lack of Transportation (Medical): Not on file  . Lack of Transportation (Non-Medical): Not on file  Physical Activity:   . Days of Exercise per Week: Not on file  . Minutes of Exercise per Session: Not on file  Stress:   .  Feeling of Stress : Not on file  Social Connections:   . Frequency of Communication with Friends and Family: Not on file  . Frequency of Social Gatherings with Friends and Family: Not on file  . Attends Religious Services: Not on file  . Active Member of Clubs or Organizations: Not on file  . Attends Archivist Meetings: Not on file  . Marital Status: Not on file     Family History: The patient's family history includes Anesthesia problems in her mother; CAD in her father; Cancer in her mother; Diabetes in her paternal grandmother; Emphysema in her mother; Heart attack in her father; Hypertension in her father; Rheum arthritis in her mother; Stroke in her father. ROS:   Please see the history of present illness.    All other systems reviewed and are negative.  EKGs/Labs/Other Studies Reviewed:    The following studies were reviewed today:  EKG:  EKG ordered today and personally reviewed.  The ekg ordered today demonstrates atrial fibrillation controlled rate she has right axis deviation possible lateral MI.  Unchanged from 01/16/2020  Recent Labs: 04/04/2020: Cholesterol at target 109 LDL 44 triglyceride 133 HDL 38 A1c at target 6.8% TSH normal 1.59 Recent Lipid Panel    Component Value Date/Time   CHOL 117 12/24/2018 1137   TRIG 151 (H) 12/24/2018 1137   HDL 39 (L) 12/24/2018 1137   CHOLHDL 3.0 12/24/2018 1137   LDLCALC 48 12/24/2018 1137    Physical Exam:    VS:  BP (!) 150/80   Pulse 62   Ht 5\' 3"  (1.6 m)   Wt 218 lb 6.4 oz (99.1 kg)   SpO2 96%   BMI 38.69 kg/m     Wt Readings from Last 3 Encounters:  09/19/20 218 lb 6.4 oz (99.1 kg)  01/22/20 214 lb (97.1 kg)  01/11/20 215 lb 3.2 oz (97.6 kg)  GEN: She appears chronically ill well nourished, well developed in no acute distress HEENT: Normal NECK: No JVD; No carotid bruits LYMPHATICS: No lymphadenopathy CARDIAC: Irregular rhythm variable first heart sound no murmurs, rubs, gallops RESPIRATORY:   Clear to auscultation without rales, wheezing or rhonchi  ABDOMEN: Soft, non-tender, non-distended MUSCULOSKELETAL: 2+ bilateral lower extremity pitting edema; No deformity  SKIN: Warm and dry NEUROLOGIC:  Alert and oriented x 3 PSYCHIATRIC:  Normal affect    Signed, Shirlee More, MD  09/19/2020 1:47 PM    [facility]

## 2020-09-19 NOTE — Patient Instructions (Signed)

## 2020-10-20 DIAGNOSIS — G4730 Sleep apnea, unspecified: Secondary | ICD-10-CM | POA: Diagnosis not present

## 2020-10-20 DIAGNOSIS — Z79899 Other long term (current) drug therapy: Secondary | ICD-10-CM | POA: Diagnosis not present

## 2020-10-20 DIAGNOSIS — M150 Primary generalized (osteo)arthritis: Secondary | ICD-10-CM | POA: Diagnosis not present

## 2020-10-20 DIAGNOSIS — E1142 Type 2 diabetes mellitus with diabetic polyneuropathy: Secondary | ICD-10-CM | POA: Diagnosis not present

## 2020-10-20 DIAGNOSIS — Z0000 Encounter for general adult medical examination without abnormal findings: Secondary | ICD-10-CM | POA: Diagnosis not present

## 2020-10-20 DIAGNOSIS — I10 Essential (primary) hypertension: Secondary | ICD-10-CM | POA: Diagnosis not present

## 2020-10-20 DIAGNOSIS — I4891 Unspecified atrial fibrillation: Secondary | ICD-10-CM | POA: Diagnosis not present

## 2020-10-20 DIAGNOSIS — D696 Thrombocytopenia, unspecified: Secondary | ICD-10-CM | POA: Diagnosis not present

## 2020-10-20 DIAGNOSIS — M859 Disorder of bone density and structure, unspecified: Secondary | ICD-10-CM | POA: Diagnosis not present

## 2020-10-20 DIAGNOSIS — I700 Atherosclerosis of aorta: Secondary | ICD-10-CM | POA: Diagnosis not present

## 2020-10-20 DIAGNOSIS — E7800 Pure hypercholesterolemia, unspecified: Secondary | ICD-10-CM | POA: Diagnosis not present

## 2020-10-24 ENCOUNTER — Telehealth: Payer: Self-pay | Admitting: Cardiology

## 2020-10-24 NOTE — Telephone Encounter (Signed)
    Polly with [facility] requesting copy of pt's recent EKG, she gave fax# [phone]

## 2020-10-25 NOTE — Telephone Encounter (Signed)
Fax sent at this time.

## 2020-10-26 ENCOUNTER — Other Ambulatory Visit: Payer: Self-pay | Admitting: Cardiology

## 2020-10-27 DIAGNOSIS — N390 Urinary tract infection, site not specified: Secondary | ICD-10-CM | POA: Diagnosis not present

## 2020-11-03 DIAGNOSIS — I340 Nonrheumatic mitral (valve) insufficiency: Secondary | ICD-10-CM | POA: Diagnosis not present

## 2020-11-03 DIAGNOSIS — I5032 Chronic diastolic (congestive) heart failure: Secondary | ICD-10-CM | POA: Diagnosis not present

## 2020-11-03 DIAGNOSIS — I361 Nonrheumatic tricuspid (valve) insufficiency: Secondary | ICD-10-CM | POA: Diagnosis not present

## 2020-12-15 DIAGNOSIS — N390 Urinary tract infection, site not specified: Secondary | ICD-10-CM | POA: Diagnosis not present

## 2020-12-28 ENCOUNTER — Other Ambulatory Visit: Payer: Self-pay | Admitting: Cardiology

## 2021-01-11 DIAGNOSIS — N390 Urinary tract infection, site not specified: Secondary | ICD-10-CM | POA: Diagnosis not present

## 2021-01-11 DIAGNOSIS — N3281 Overactive bladder: Secondary | ICD-10-CM | POA: Diagnosis not present

## 2021-01-19 DIAGNOSIS — E1142 Type 2 diabetes mellitus with diabetic polyneuropathy: Secondary | ICD-10-CM | POA: Diagnosis not present

## 2021-01-19 DIAGNOSIS — M150 Primary generalized (osteo)arthritis: Secondary | ICD-10-CM | POA: Diagnosis not present

## 2021-01-19 DIAGNOSIS — I10 Essential (primary) hypertension: Secondary | ICD-10-CM | POA: Diagnosis not present

## 2021-01-19 DIAGNOSIS — Z6841 Body Mass Index (BMI) 40.0 and over, adult: Secondary | ICD-10-CM | POA: Diagnosis not present

## 2021-01-19 DIAGNOSIS — E7800 Pure hypercholesterolemia, unspecified: Secondary | ICD-10-CM | POA: Diagnosis not present

## 2021-01-19 DIAGNOSIS — E038 Other specified hypothyroidism: Secondary | ICD-10-CM | POA: Diagnosis not present

## 2021-01-19 DIAGNOSIS — I700 Atherosclerosis of aorta: Secondary | ICD-10-CM | POA: Diagnosis not present

## 2021-01-20 ENCOUNTER — Telehealth: Payer: Self-pay | Admitting: Internal Medicine

## 2021-01-20 NOTE — Telephone Encounter (Signed)
[facility] compliance printed and placed in Dr. Janee Morn review folder. Nothing further needed at this time.

## 2021-01-21 NOTE — Progress Notes (Signed)
HPI F never smoker, followed for OSA, chronic bronchitis, allergic rhinitis, complicated by nonalcoholic cirrhosis/[name] GI,   A. Fib/"Watchman" clot- preventing device placed in her atrial appendage , HBP, MI, chronic diastolic CHF, GERD, DM 2, hypothyroid NPSG 11/05/01-AHI 93/hour, desaturation to 63%, body weight 217 pounds PFT 07/26/09-mild restriction and reduction of diffusion. No obstruction, no response to dilator -----------------------------------------------------------------------------------    01/22/20-  75 year old female never smoker followed for OSA, chronic bronchitis, allergic rhinitis, complicated by nonalcoholic cirrhosis/[name] GI,  AFib/"Watchman" clot- preventing device placed in her atrial appendage, HBP, MI, chronic diastolic CHF, GERD, DM 2, hypothyroid  CPAP auto 5-15/[facility]               Husband here [place]- Body weight today- 214 lbs Had first Covid vax. Uses CPAP any time she sleeps or naps,but still tends to feel tired. Machine replaced last year and working well.  Continuing to work with GI/ Dr Melina Copa- "liver is smaller, spleen is bigger". Comes in wheelchair because of chronic pain she relates to fibromyalgia, arthritis, and also easy DOE which is unchanged. Admits very little exercise. No acute event. Not coughing.  01/23/21- 75 year old female never smoker followed for OSA, chronic bronchitis, allergic rhinitis, complicated by nonalcoholic cirrhosis/[name] GI,  AFib/"Watchman" clot- preventing device placed in her atrial appendage, HBP, MI, chronic diastolic CHF, GERD, DM 2, hypothyroid  CPAP auto 5-15/[facility]    Download- compliance 47%, AHi 5.7/ hr Body weight today-208 lbs Covid vax-   3 Moderna                                    No inhalers, no recent cxr Flu vax-had -----"Wears CPAP-7 hrs. Each night" I reviewed download with her, demonstrating she is not meeting compliance goals. Notes dyspnea when she first lies down. Some dry cough, no  wheeze, no acute change. No inhalers.  ROS-see HPI  + = positive Constitutional:   No-   weight loss, night sweats, fevers, chills, + fatigue, lassitude. HEENT:   No-  headaches, difficulty swallowing, tooth/dental problems, sore throat,       No-  sneezing, itching, ear ache, nasal congestion, post nasal drip,  CV:  No-   chest pain, orthopnea, PND, swelling in lower extremities, anasarca, dizziness, palpitations Resp:+ shortness of breath with exertion or at rest.             productive cough,  + non-productive cough,  No- coughing up of blood.              No-   change in color of mucus.  No- wheezing.   Skin: No-   rash or lesions. GI:  No-   heartburn, indigestion, abdominal pain, nausea, vomiting,  GU:  MS:  + joint pain or swelling. + back pain. Neuro-     nothing unusual Psych:  No- change in mood or affect. No depression or anxiety.  No memory loss.  OBJ- Physical Exam General- Alert, Oriented, Affect-appropriate, Distress- none acute,   +rolling walker +morbid obesity Skin-  + bleeding mole R cheek she associates with CPAP headgear. Lymphadenopathy- none Head- atraumatic            Eyes- Gross vision intact, PERRLA, conjunctivae and secretions clear            Ears- Hearing, canals-normal            Nose- Clear, no-Septal dev, mucus, polyps, erosion,  perforation             Throat- Mallampati II-III , mucosa clear , drainage- none, tonsils- atrophic Neck- flexible , trachea midline, no stridor , thyroid nl, carotid no bruit Chest - symmetrical excursion , unlabored           Heart/CV- IRR/Afib , no murmur , no gallop  , no rub, nl s1 s2                           - JVD- none , edema- none, stasis changes +, varices- none           Lung- +fine crackles, wheeze- none, cough- none , dullness-none, rub- none           Chest wall-  Abd-  Br/ Gen/ Rectal- Not done, not indicated Extrem- +braces on ankles Neuro- grossly intact to observation

## 2021-01-23 ENCOUNTER — Other Ambulatory Visit: Payer: Self-pay

## 2021-01-23 ENCOUNTER — Ambulatory Visit (INDEPENDENT_AMBULATORY_CARE_PROVIDER_SITE_OTHER): Payer: Medicare Other

## 2021-01-23 ENCOUNTER — Ambulatory Visit (INDEPENDENT_AMBULATORY_CARE_PROVIDER_SITE_OTHER): Payer: Medicare Other | Admitting: Internal Medicine

## 2021-01-23 ENCOUNTER — Encounter: Payer: Self-pay | Admitting: Internal Medicine

## 2021-01-23 VITALS — BP 134/68 | HR 65 | Temp 97.7°F | Ht 61.0 in | Wt 208.8 lb

## 2021-01-23 DIAGNOSIS — I517 Cardiomegaly: Secondary | ICD-10-CM | POA: Diagnosis not present

## 2021-01-23 DIAGNOSIS — D229 Melanocytic nevi, unspecified: Secondary | ICD-10-CM | POA: Diagnosis not present

## 2021-01-23 DIAGNOSIS — J410 Simple chronic bronchitis: Secondary | ICD-10-CM

## 2021-01-23 DIAGNOSIS — M47814 Spondylosis without myelopathy or radiculopathy, thoracic region: Secondary | ICD-10-CM | POA: Diagnosis not present

## 2021-01-23 DIAGNOSIS — R233 Spontaneous ecchymoses: Secondary | ICD-10-CM | POA: Diagnosis not present

## 2021-01-23 DIAGNOSIS — I2510 Atherosclerotic heart disease of native coronary artery without angina pectoris: Secondary | ICD-10-CM | POA: Diagnosis not present

## 2021-01-23 DIAGNOSIS — M19011 Primary osteoarthritis, right shoulder: Secondary | ICD-10-CM | POA: Diagnosis not present

## 2021-01-23 DIAGNOSIS — G4733 Obstructive sleep apnea (adult) (pediatric): Secondary | ICD-10-CM | POA: Diagnosis not present

## 2021-01-23 DIAGNOSIS — J42 Unspecified chronic bronchitis: Secondary | ICD-10-CM | POA: Diagnosis not present

## 2021-01-23 NOTE — Patient Instructions (Signed)
Order- CXR-   Dx Chronic Bronchitis  Order- referral to [facility]   Dx bleeding mole  Order- refer to [facility] for Mask Fitting  Please try to use your CPAP more consistently- we are  Trying for at least 4 hours a night, every night.

## 2021-01-24 ENCOUNTER — Encounter: Payer: Self-pay | Admitting: *Deleted

## 2021-01-26 DIAGNOSIS — C44319 Basal cell carcinoma of skin of other parts of face: Secondary | ICD-10-CM | POA: Diagnosis not present

## 2021-01-26 DIAGNOSIS — L821 Other seborrheic keratosis: Secondary | ICD-10-CM | POA: Diagnosis not present

## 2021-01-30 NOTE — Progress Notes (Signed)
Cardiology Office Note:    Date:  01/31/2021   ID:  [name] [name], DOB Feb 24, 1946, MRN [id number]  PCP:  Greig Right, MD  Cardiologist:  Shirlee More, MD    Referring MD: Greig Right, MD    ASSESSMENT:    1. Chronic atrial fibrillation ([place])   2. Presence of Watchman left atrial appendage closure device   3. Hypertensive heart disease with chronic diastolic congestive heart failure ([place])   4. Mild CAD   5. Mixed hyperlipidemia    PLAN:    In order of problems listed above:  1. Overall is done well atrial fibrillation is asymptomatic rate controlled on her beta-blocker and is not anticoagulated with watchman device 2. BP at target continue current treatment including her loop diuretic ARB and beta-blocker. 3. Heart failure compensated continue her current low-dose diuretic 4. Stable CAD having no anginal discomfort continue medical therapy including oral nitrate 5. Ideal lipids continue high intensity statin   Next appointment: 6 months   Medication Adjustments/Labs and Tests Ordered: Current medicines are reviewed at length with the patient today.  Concerns regarding medicines are outlined above.  No orders of the defined types were placed in this encounter.  No orders of the defined types were placed in this encounter.   Chief Complaint  Patient presents with  . Follow-up  . Atrial Fibrillation  . Coronary Artery Disease  . Congestive Heart Failure    History of Present Illness:    [name] [name] is a 75 y.o. female with a hx of chronic atrial fibrillation with watchman left atrial appendage occlusion device mild nonobstructive CAD hypertension with chronic diastolic heart failure and cirrhosis last seen 09/19/2020.  Compliance with diet, lifestyle and medications: Yes  She is encouraged to use family resolved her recurrent urinary tract infections. She tells me she had recent lab work done a few weeks ago I requested a copy. Most recent labs available to  me 10/21/2020 cholesterol 121 LDL 61 triglycerides 104 HDL 39 A1c 6.4% creatinine 0.79. She feels better from cardiovascular perspective has had no edema exertional shortness of breath chest pain palpitation or syncope. When she first gets into bed she is breathless for a moment.  She had an echocardiogram performed at [facility] 11/03/2020 ordered by her PCP indication heart failure it showed the presence of an EF of 55 to 60% severe left atrial enlargement moderate right atrial enlargement and mild mitral and tricuspid regurgitation. Past Medical History:  Diagnosis Date  . Allergic rhinitis   . Anxiety   . ANXIETY 12/26/2007   Qualifier: Diagnosis of  By: Ronnald Ramp CNA/MA, Janett Billow    . Arthritis   . Bronchitis   . Chest pain in adult 12/24/2018  . CHF (congestive heart failure) ([place])   . CHF, MILD 12/26/2007   Qualifier: Diagnosis of  By: Ronnald Ramp CNA/MA, Janett Billow    . Chronic atrial fibrillation (HCC) 04/24/2016   Watchman atrial appendage device placed at [facility] for clot pevention  . Chronic diastolic heart failure ([place])   . Chronic liver disease 03/15/2016  . Closed fracture of proximal tibia 03/13/2018  . Complication of anesthesia    low blood pressure once  . Depression   . Enlarged heart   . Esophageal reflux 07/04/2017  . Essential hypertension 12/26/2007   Qualifier: Diagnosis of  By: Ronnald Ramp CNA/MA, Janett Billow    . Fibromyalgia   . GERD (gastroesophageal reflux disease)   . HTN (hypertension)    on medication since age 86  . Hypercholesteremia   .  Hyperlipidemia 02/29/2016  . Hypertensive heart disease    on medication since age 14  . Hypothyroidism 03/20/2016  . Idiopathic cirrhosis (HCC)    stage 4; sees [name] in [place]  . Mild CAD 02/29/2016  . Myocardial infarction [facility])    age 6  . Neuropathy, peripheral    lower extremities  . OSA (obstructive sleep apnea)   . OSA on CPAP 07/04/2008   CPAP [facility]/ [employer]   . Presence of Watchman left atrial appendage closure device  01/30/2018  . Seasonal and perennial allergic rhinitis 01/23/2011   Allergy vaccine restarted at 1:50 08/02/2011 [place], [place] 2016   . Simple chronic bronchitis ([place]) 12/26/2007   PFT 07/26/09-mild restriction and reduction of diffusion. No obstruction, no response to dilator   . Sleep apnea   . Somnolence   . SOMNOLENCE 12/26/2007   Annotation: excessive daytime Qualifier: Diagnosis of  By: Ronnald Ramp CNA/MA, Janett Billow    . Splenomegaly   . Swelling of joint, knee, right 01/30/2018  . Type 2 diabetes mellitus ([place]) 12/29/2007   Qualifier: Diagnosis of  By: Annamaria Boots MD, Clinton D   . Type 2 diabetes mellitus without complications ([place])   . Type II or unspecified type diabetes mellitus without mention of complication, not stated as uncontrolled   . Urinary incontinence, nocturnal enuresis   . Urinary, incontinence, stress female    wears depends    Past Surgical History:  Procedure Laterality Date  . ABDOMINAL HYSTERECTOMY    . APPENDECTOMY    . BACK SURGERY    . CARPAL TUNNEL RELEASE     bilaterally  . CATARACT EXTRACTION W/ INTRAOCULAR LENS  IMPLANT, BILATERAL    . DILATION AND CURETTAGE OF UTERUS    . EYE SURGERY     cataract ext/ iol implants  . KNEE ARTHROSCOPY    . LUMBAR LAMINECTOMY  03/2011; 01/2012  . MOUTH SURGERY    . SPLENECTOMY    . TONSILLECTOMY AND ADENOIDECTOMY    . TOTAL ABDOMINAL HYSTERECTOMY      Current Medications: Current Meds  Medication Sig  . acarbose (PRECOSE) 100 MG tablet Take 100 mg by mouth 3 (three) times daily with meals.  Marland Kitchen alendronate (FOSAMAX) 70 MG tablet Take 70 mg by mouth once a week.   Marland Kitchen amoxicillin (AMOXIL) 500 MG capsule TAKE 4 CAPSULES 1 HOUR PRIOR TO DENTAL APPOINTMENT.  Marland Kitchen buPROPion (WELLBUTRIN XL) 300 MG 24 hr tablet Take 300 mg by mouth daily.  . carvedilol (COREG) 25 MG tablet Take 25 mg by mouth 2 (two) times daily.  . diflunisal (DOLOBID) 500 MG TABS Take 1 tablet by mouth 2 (two) times daily.  Marland Kitchen doxazosin (CARDURA) 4 MG tablet TAKE 1 TABLET BY  MOUTH DAILY  . furosemide (LASIX) 20 MG tablet Take 1 tablet by mouth X's 2 days then take only on Mondays, Wednesdays and Fridays  . gabapentin (NEURONTIN) 600 MG tablet Take 600 mg by mouth 3 (three) times daily.   . irbesartan (AVAPRO) 300 MG tablet Take 300 mg by mouth daily.  . isosorbide mononitrate (IMDUR) 60 MG 24 hr tablet Take 60 mg by mouth daily.  Marland Kitchen levothyroxine (SYNTHROID, LEVOTHROID) 125 MCG tablet Take 125 mcg by mouth daily before breakfast.   . metFORMIN (GLUCOPHAGE-XR) 500 MG 24 hr tablet Take 500 mg by mouth 2 (two) times daily.   Marland Kitchen NITROSTAT 0.4 MG SL tablet Place 0.4 mg under the tongue every 5 (five) minutes as needed for chest pain.   Marland Kitchen omeprazole (PRILOSEC)  40 MG capsule Take 1 capsule by mouth 2 (two) times daily.  . potassium chloride SA (K-DUR,KLOR-CON) 20 MEQ tablet Take 40 mEq by mouth 3 (three) times daily.   . rosuvastatin (CRESTOR) 5 MG tablet TAKE 1 TABLET BY MOUTH DAILY AT 6 PM.  . TOVIAZ 4 MG TB24 tablet Take 1 tablet by mouth daily.  . traMADol (ULTRAM) 50 MG tablet Take 50 mg by mouth every 6 (six) hours as needed. For pain  . venlafaxine XR (EFFEXOR-XR) 150 MG 24 hr capsule Take 150 mg by mouth daily with breakfast.   . Vitamin D, Ergocalciferol, (DRISDOL) 1.25 MG (50000 UNIT) CAPS capsule Take 50,000 Units by mouth once a week.     Allergies:   Azithromycin, Cefuroxime axetil, Celecoxib, Codeine, Sulfa antibiotics, Amlodipine, Canagliflozin, Exenatide, Hydralazine hcl, Other, and Norvasc [amlodipine besylate]   Social History   Socioeconomic History  . Marital status: Married    Spouse name: Not on file  . Number of children: Not on file  . Years of education: Not on file  . Highest education level: Not on file  Occupational History  . Not on file  Tobacco Use  . Smoking status: Never Smoker  . Smokeless tobacco: Never Used  Vaping Use  . Vaping Use: Never used  Substance and Sexual Activity  . Alcohol use: No  . Drug use: No  . Sexual  activity: Not Currently  Other Topics Concern  . Not on file  Social History Narrative  . Not on file   Social Determinants of Health   Financial Resource Strain: Not on file  Food Insecurity: Not on file  Transportation Needs: Not on file  Physical Activity: Not on file  Stress: Not on file  Social Connections: Not on file     Family History: The patient's family history includes Anesthesia problems in her mother; CAD in her father; Cancer in her mother; Diabetes in her paternal grandmother; Emphysema in her mother; Heart attack in her father; Hypertension in her father; Rheum arthritis in her mother; Stroke in her father. ROS:   Please see the history of present illness.    All other systems reviewed and are negative.  EKGs/Labs/Other Studies Reviewed:    The following studies were reviewed today:  Recent Labs: No results found for requested labs within last 8760 hours.  Recent Lipid Panel    Component Value Date/Time   CHOL 117 12/24/2018 1137   TRIG 151 (H) 12/24/2018 1137   HDL 39 (L) 12/24/2018 1137   CHOLHDL 3.0 12/24/2018 1137   LDLCALC 48 12/24/2018 1137    Physical Exam:    VS:  BP (!) 148/66   Pulse 60   Ht 5\' 1"  (1.549 m)   Wt 213 lb 12.8 oz (97 kg)   SpO2 97%   BMI 40.40 kg/m     Wt Readings from Last 3 Encounters:  01/31/21 213 lb 12.8 oz (97 kg)  01/23/21 208 lb 12.8 oz (94.7 kg)  09/19/20 218 lb 6.4 oz (99.1 kg)     GEN:  Well nourished, well developed in no acute distress HEENT: Normal NECK: No JVD; No carotid bruits LYMPHATICS: No lymphadenopathy CARDIAC: S1 is variable the rhythm is irregular  no murmurs, rubs, gallops RESPIRATORY:  Clear to auscultation without rales, wheezing or rhonchi  ABDOMEN: Soft, non-tender, non-distended MUSCULOSKELETAL:  No edema; No deformity  SKIN: Warm and dry NEUROLOGIC:  Alert and oriented x 3 PSYCHIATRIC:  Normal affect    Signed, Shirlee More, MD  01/31/2021 3:13 PM    [facility]

## 2021-01-31 ENCOUNTER — Encounter: Payer: Self-pay | Admitting: Cardiology

## 2021-01-31 ENCOUNTER — Other Ambulatory Visit: Payer: Self-pay

## 2021-01-31 ENCOUNTER — Ambulatory Visit (INDEPENDENT_AMBULATORY_CARE_PROVIDER_SITE_OTHER): Payer: Medicare Other | Admitting: Cardiology

## 2021-01-31 VITALS — BP 148/66 | HR 60 | Ht 61.0 in | Wt 213.8 lb

## 2021-01-31 DIAGNOSIS — Z95818 Presence of other cardiac implants and grafts: Secondary | ICD-10-CM | POA: Diagnosis not present

## 2021-01-31 DIAGNOSIS — I5032 Chronic diastolic (congestive) heart failure: Secondary | ICD-10-CM

## 2021-01-31 DIAGNOSIS — E782 Mixed hyperlipidemia: Secondary | ICD-10-CM | POA: Diagnosis not present

## 2021-01-31 DIAGNOSIS — I482 Chronic atrial fibrillation, unspecified: Secondary | ICD-10-CM | POA: Diagnosis not present

## 2021-01-31 DIAGNOSIS — I2510 Atherosclerotic heart disease of native coronary artery without angina pectoris: Secondary | ICD-10-CM

## 2021-01-31 DIAGNOSIS — I110 Hypertensive heart disease with heart failure: Secondary | ICD-10-CM

## 2021-01-31 NOTE — Patient Instructions (Signed)

## 2021-02-07 DIAGNOSIS — C44319 Basal cell carcinoma of skin of other parts of face: Secondary | ICD-10-CM | POA: Diagnosis not present

## 2021-02-07 DIAGNOSIS — Z85828 Personal history of other malignant neoplasm of skin: Secondary | ICD-10-CM | POA: Diagnosis not present

## 2021-02-13 ENCOUNTER — Other Ambulatory Visit (HOSPITAL_BASED_OUTPATIENT_CLINIC_OR_DEPARTMENT_OTHER): Payer: Medicare Other | Admitting: Internal Medicine

## 2021-02-20 ENCOUNTER — Other Ambulatory Visit (HOSPITAL_BASED_OUTPATIENT_CLINIC_OR_DEPARTMENT_OTHER): Payer: Medicare Other | Admitting: Internal Medicine

## 2021-02-23 ENCOUNTER — Other Ambulatory Visit: Payer: Self-pay | Admitting: Cardiology

## 2021-03-09 DIAGNOSIS — E039 Hypothyroidism, unspecified: Secondary | ICD-10-CM | POA: Diagnosis not present

## 2021-03-09 DIAGNOSIS — E1142 Type 2 diabetes mellitus with diabetic polyneuropathy: Secondary | ICD-10-CM | POA: Diagnosis not present

## 2021-03-09 DIAGNOSIS — I2510 Atherosclerotic heart disease of native coronary artery without angina pectoris: Secondary | ICD-10-CM | POA: Diagnosis not present

## 2021-03-09 DIAGNOSIS — Z7984 Long term (current) use of oral hypoglycemic drugs: Secondary | ICD-10-CM | POA: Diagnosis not present

## 2021-03-13 DIAGNOSIS — K8020 Calculus of gallbladder without cholecystitis without obstruction: Secondary | ICD-10-CM | POA: Diagnosis not present

## 2021-03-13 DIAGNOSIS — K7460 Unspecified cirrhosis of liver: Secondary | ICD-10-CM | POA: Diagnosis not present

## 2021-03-15 DIAGNOSIS — D649 Anemia, unspecified: Secondary | ICD-10-CM | POA: Diagnosis not present

## 2021-03-20 DIAGNOSIS — E113212 Type 2 diabetes mellitus with mild nonproliferative diabetic retinopathy with macular edema, left eye: Secondary | ICD-10-CM | POA: Diagnosis not present

## 2021-04-10 DIAGNOSIS — N390 Urinary tract infection, site not specified: Secondary | ICD-10-CM | POA: Diagnosis not present

## 2021-04-10 DIAGNOSIS — N3281 Overactive bladder: Secondary | ICD-10-CM | POA: Diagnosis not present

## 2021-04-13 DIAGNOSIS — E113212 Type 2 diabetes mellitus with mild nonproliferative diabetic retinopathy with macular edema, left eye: Secondary | ICD-10-CM | POA: Diagnosis not present

## 2021-04-13 DIAGNOSIS — E113291 Type 2 diabetes mellitus with mild nonproliferative diabetic retinopathy without macular edema, right eye: Secondary | ICD-10-CM | POA: Diagnosis not present

## 2021-04-24 DIAGNOSIS — E113219 Type 2 diabetes mellitus with mild nonproliferative diabetic retinopathy with macular edema, unspecified eye: Secondary | ICD-10-CM | POA: Diagnosis not present

## 2021-04-24 DIAGNOSIS — I10 Essential (primary) hypertension: Secondary | ICD-10-CM | POA: Diagnosis not present

## 2021-04-24 DIAGNOSIS — I700 Atherosclerosis of aorta: Secondary | ICD-10-CM | POA: Diagnosis not present

## 2021-04-24 DIAGNOSIS — I25119 Atherosclerotic heart disease of native coronary artery with unspecified angina pectoris: Secondary | ICD-10-CM | POA: Diagnosis not present

## 2021-04-24 DIAGNOSIS — Z79899 Other long term (current) drug therapy: Secondary | ICD-10-CM | POA: Diagnosis not present

## 2021-04-24 DIAGNOSIS — N1831 Chronic kidney disease, stage 3a: Secondary | ICD-10-CM | POA: Diagnosis not present

## 2021-04-24 DIAGNOSIS — M545 Low back pain, unspecified: Secondary | ICD-10-CM | POA: Diagnosis not present

## 2021-04-24 DIAGNOSIS — Z6841 Body Mass Index (BMI) 40.0 and over, adult: Secondary | ICD-10-CM | POA: Diagnosis not present

## 2021-04-24 DIAGNOSIS — I4891 Unspecified atrial fibrillation: Secondary | ICD-10-CM | POA: Diagnosis not present

## 2021-04-24 DIAGNOSIS — E1142 Type 2 diabetes mellitus with diabetic polyneuropathy: Secondary | ICD-10-CM | POA: Diagnosis not present

## 2021-04-26 DIAGNOSIS — R2689 Other abnormalities of gait and mobility: Secondary | ICD-10-CM | POA: Diagnosis not present

## 2021-04-26 DIAGNOSIS — M62551 Muscle wasting and atrophy, not elsewhere classified, right thigh: Secondary | ICD-10-CM | POA: Diagnosis not present

## 2021-04-26 DIAGNOSIS — M545 Low back pain, unspecified: Secondary | ICD-10-CM | POA: Diagnosis not present

## 2021-04-26 DIAGNOSIS — M62552 Muscle wasting and atrophy, not elsewhere classified, left thigh: Secondary | ICD-10-CM | POA: Diagnosis not present

## 2021-04-28 DIAGNOSIS — M545 Low back pain, unspecified: Secondary | ICD-10-CM | POA: Diagnosis not present

## 2021-04-28 DIAGNOSIS — M62552 Muscle wasting and atrophy, not elsewhere classified, left thigh: Secondary | ICD-10-CM | POA: Diagnosis not present

## 2021-04-28 DIAGNOSIS — R2689 Other abnormalities of gait and mobility: Secondary | ICD-10-CM | POA: Diagnosis not present

## 2021-04-28 DIAGNOSIS — M62551 Muscle wasting and atrophy, not elsewhere classified, right thigh: Secondary | ICD-10-CM | POA: Diagnosis not present

## 2021-05-01 DIAGNOSIS — M542 Cervicalgia: Secondary | ICD-10-CM | POA: Diagnosis not present

## 2021-05-01 DIAGNOSIS — R52 Pain, unspecified: Secondary | ICD-10-CM | POA: Diagnosis not present

## 2021-05-01 DIAGNOSIS — R531 Weakness: Secondary | ICD-10-CM | POA: Diagnosis not present

## 2021-05-01 DIAGNOSIS — R7401 Elevation of levels of liver transaminase levels: Secondary | ICD-10-CM | POA: Diagnosis not present

## 2021-05-01 DIAGNOSIS — R0902 Hypoxemia: Secondary | ICD-10-CM | POA: Diagnosis not present

## 2021-05-01 DIAGNOSIS — I959 Hypotension, unspecified: Secondary | ICD-10-CM | POA: Diagnosis not present

## 2021-05-01 DIAGNOSIS — F0390 Unspecified dementia without behavioral disturbance: Secondary | ICD-10-CM | POA: Diagnosis not present

## 2021-05-01 DIAGNOSIS — R109 Unspecified abdominal pain: Secondary | ICD-10-CM | POA: Diagnosis not present

## 2021-05-01 DIAGNOSIS — I517 Cardiomegaly: Secondary | ICD-10-CM | POA: Diagnosis not present

## 2021-05-02 DIAGNOSIS — F0390 Unspecified dementia without behavioral disturbance: Secondary | ICD-10-CM | POA: Diagnosis present

## 2021-05-02 DIAGNOSIS — Z6839 Body mass index (BMI) 39.0-39.9, adult: Secondary | ICD-10-CM | POA: Diagnosis not present

## 2021-05-02 DIAGNOSIS — R161 Splenomegaly, not elsewhere classified: Secondary | ICD-10-CM | POA: Diagnosis present

## 2021-05-02 DIAGNOSIS — E119 Type 2 diabetes mellitus without complications: Secondary | ICD-10-CM | POA: Diagnosis present

## 2021-05-02 DIAGNOSIS — Z792 Long term (current) use of antibiotics: Secondary | ICD-10-CM | POA: Diagnosis not present

## 2021-05-02 DIAGNOSIS — B9620 Unspecified Escherichia coli [E. coli] as the cause of diseases classified elsewhere: Secondary | ICD-10-CM | POA: Diagnosis present

## 2021-05-02 DIAGNOSIS — F329 Major depressive disorder, single episode, unspecified: Secondary | ICD-10-CM | POA: Diagnosis present

## 2021-05-02 DIAGNOSIS — I340 Nonrheumatic mitral (valve) insufficiency: Secondary | ICD-10-CM | POA: Diagnosis not present

## 2021-05-02 DIAGNOSIS — K7460 Unspecified cirrhosis of liver: Secondary | ICD-10-CM | POA: Diagnosis present

## 2021-05-02 DIAGNOSIS — I252 Old myocardial infarction: Secondary | ICD-10-CM | POA: Diagnosis not present

## 2021-05-02 DIAGNOSIS — N3000 Acute cystitis without hematuria: Secondary | ICD-10-CM | POA: Diagnosis present

## 2021-05-02 DIAGNOSIS — K7581 Nonalcoholic steatohepatitis (NASH): Secondary | ICD-10-CM | POA: Diagnosis present

## 2021-05-02 DIAGNOSIS — R7401 Elevation of levels of liver transaminase levels: Secondary | ICD-10-CM | POA: Diagnosis not present

## 2021-05-02 DIAGNOSIS — I2510 Atherosclerotic heart disease of native coronary artery without angina pectoris: Secondary | ICD-10-CM | POA: Diagnosis present

## 2021-05-02 DIAGNOSIS — I5032 Chronic diastolic (congestive) heart failure: Secondary | ICD-10-CM | POA: Diagnosis present

## 2021-05-02 DIAGNOSIS — M1990 Unspecified osteoarthritis, unspecified site: Secondary | ICD-10-CM | POA: Diagnosis present

## 2021-05-02 DIAGNOSIS — R109 Unspecified abdominal pain: Secondary | ICD-10-CM | POA: Diagnosis not present

## 2021-05-02 DIAGNOSIS — R531 Weakness: Secondary | ICD-10-CM | POA: Diagnosis not present

## 2021-05-02 DIAGNOSIS — I959 Hypotension, unspecified: Secondary | ICD-10-CM | POA: Diagnosis present

## 2021-05-02 DIAGNOSIS — F32A Depression, unspecified: Secondary | ICD-10-CM | POA: Diagnosis present

## 2021-05-02 DIAGNOSIS — Z881 Allergy status to other antibiotic agents status: Secondary | ICD-10-CM | POA: Diagnosis not present

## 2021-05-02 DIAGNOSIS — I361 Nonrheumatic tricuspid (valve) insufficiency: Secondary | ICD-10-CM | POA: Diagnosis not present

## 2021-05-02 DIAGNOSIS — Z87440 Personal history of urinary (tract) infections: Secondary | ICD-10-CM | POA: Diagnosis not present

## 2021-05-02 DIAGNOSIS — I517 Cardiomegaly: Secondary | ICD-10-CM | POA: Diagnosis not present

## 2021-05-02 DIAGNOSIS — G4730 Sleep apnea, unspecified: Secondary | ICD-10-CM | POA: Diagnosis present

## 2021-05-02 DIAGNOSIS — I110 Hypertensive heart disease with heart failure: Secondary | ICD-10-CM | POA: Diagnosis present

## 2021-05-02 DIAGNOSIS — Z79899 Other long term (current) drug therapy: Secondary | ICD-10-CM | POA: Diagnosis not present

## 2021-05-02 DIAGNOSIS — F419 Anxiety disorder, unspecified: Secondary | ICD-10-CM | POA: Diagnosis present

## 2021-05-02 DIAGNOSIS — Z20822 Contact with and (suspected) exposure to covid-19: Secondary | ICD-10-CM | POA: Diagnosis present

## 2021-05-03 DIAGNOSIS — I361 Nonrheumatic tricuspid (valve) insufficiency: Secondary | ICD-10-CM

## 2021-05-03 DIAGNOSIS — I340 Nonrheumatic mitral (valve) insufficiency: Secondary | ICD-10-CM

## 2021-05-07 ENCOUNTER — Encounter: Payer: Self-pay | Admitting: Internal Medicine

## 2021-05-07 DIAGNOSIS — R233 Spontaneous ecchymoses: Secondary | ICD-10-CM | POA: Insufficient documentation

## 2021-05-07 NOTE — Assessment & Plan Note (Signed)
Benefits when used, but needs to work on compliance. Plan- mask fitting referral, continue auto 5-15

## 2021-05-07 NOTE — Assessment & Plan Note (Signed)
R cheek- irritated by current CPAP headgear Plan- emphasized need to review with PCP for potential referral to Dermatology. CPAP mask fitting to see if other headgear can avoid traumatizing this.

## 2021-05-07 NOTE — Assessment & Plan Note (Signed)
Some cough as she lies down- nonspecific with potential role of edema, reflux Plan- CXR. Watch need to restart inhalers.

## 2021-05-10 ENCOUNTER — Other Ambulatory Visit: Payer: Self-pay | Admitting: *Deleted

## 2021-05-10 NOTE — Patient Outreach (Signed)
[place] [facility]) Care Management  05/10/2021  [name] [name] 28-Aug-1946 [id number]   [facility] Telephone Assessment/Screen for post hospital referral  Referral Date: 05/08/21 Referral Source: PING  Referral Reason: post hospital screening/complex care Insurance:medicare - [name] cross [name] shield supplement No [place] admission [facility] health admission 6/16-18/22 urinary tract infection (UTI) ED visit on 05/01/21  Outreach attempt #1 successful to [phone] Patient is able to verify HIPAA, DOB and address Reviewed and addressed referral to [facility] with patient Consent: [facility] RN CM reviewed [name] services with patient. Patient gave verbal consent for services [facility] telephonic RN CM.      [facility] RN CM care coordination Need to find out lab results HgA1c 6.6 on 01/19/21  [facility] progression:  initial assessment completed  Pt reports she is doing well denies medical concerns but want to be informed of her discharging labs as has  history of recurrent urinary tract infection (UTI)  Social: Lives at home with her husband who assists in care and with transportation   Patient Active Problem List   Diagnosis Date Noted   Bleeding skin mole 05/07/2021   Sleep apnea    HTN (hypertension)    CHF (congestive heart failure) ([name] [name])    Bronchitis    Chest pain in adult 12/24/2018   Closed fracture of proximal tibia 03/13/2018   Swelling of joint, knee, right 01/30/2018   Presence of Watchman left atrial appendage closure device 01/30/2018   Esophageal reflux 07/04/2017   Urinary, incontinence, stress female    Urinary incontinence, nocturnal enuresis    Type 2 diabetes mellitus without complications (HCC)    Splenomegaly    Somnolence    OSA (obstructive sleep apnea)    Neuropathy, peripheral    Myocardial infarction ([name] [name])    Idiopathic cirrhosis (HCC)    Hypercholesteremia    Hypertensive heart disease    GERD (gastroesophageal reflux disease)    Fibromyalgia    Enlarged heart     Complication of anesthesia    Chronic diastolic heart failure (HCC)    Arthritis    Anxiety    Chronic atrial fibrillation ([name] [name]) 04/24/2016   Hypothyroidism 03/20/2016   Chronic liver disease 03/15/2016   Hyperlipidemia 02/29/2016   Mild CAD 02/29/2016   Seasonal and perennial allergic rhinitis 01/23/2011   Type 2 diabetes mellitus ([name] [name]) 12/29/2007   ANXIETY 12/26/2007   Depression 12/26/2007   Essential hypertension 12/26/2007   CHF, MILD 12/26/2007   Simple chronic bronchitis ([place]) 12/26/2007   SOMNOLENCE 12/26/2007   Current Outpatient Medications on File Prior to Visit  Medication Sig Dispense Refill   acarbose (PRECOSE) 100 MG tablet Take 100 mg by mouth 3 (three) times daily with meals.     alendronate (FOSAMAX) 70 MG tablet Take 70 mg by mouth once a week.      amoxicillin (AMOXIL) 500 MG capsule TAKE 4 CAPSULES 1 HOUR PRIOR TO DENTAL APPOINTMENT.     buPROPion (WELLBUTRIN XL) 300 MG 24 hr tablet Take 300 mg by mouth daily.     carvedilol (COREG) 25 MG tablet Take 25 mg by mouth 2 (two) times daily.     diflunisal (DOLOBID) 500 MG TABS Take 1 tablet by mouth 2 (two) times daily.     doxazosin (CARDURA) 4 MG tablet TAKE 1 TABLET BY MOUTH DAILY 90 tablet 2   furosemide (LASIX) 20 MG tablet TAKE 1 TABLET BY MOUTH FOR 2 DAYS THEN TAKE ONLY ON MONDAYS, WEDNESDAYS AND FRIDAYS 40 tablet 2   gabapentin (NEURONTIN)  600 MG tablet Take 600 mg by mouth 3 (three) times daily.      irbesartan (AVAPRO) 300 MG tablet Take 300 mg by mouth daily.     isosorbide mononitrate (IMDUR) 60 MG 24 hr tablet Take 60 mg by mouth daily.     levothyroxine (SYNTHROID, LEVOTHROID) 125 MCG tablet Take 125 mcg by mouth daily before breakfast.      metFORMIN (GLUCOPHAGE-XR) 500 MG 24 hr tablet Take 500 mg by mouth 2 (two) times daily.      NITROSTAT 0.4 MG SL tablet Place 0.4 mg under the tongue every 5 (five) minutes as needed for chest pain.      omeprazole (PRILOSEC) 40 MG capsule Take 1 capsule by mouth  2 (two) times daily.     potassium chloride SA (K-DUR,KLOR-CON) 20 MEQ tablet Take 40 mEq by mouth 3 (three) times daily.      rosuvastatin (CRESTOR) 5 MG tablet TAKE 1 TABLET BY MOUTH DAILY AT 6 PM. 90 tablet 2   TOVIAZ 4 MG TB24 tablet Take 1 tablet by mouth daily.     traMADol (ULTRAM) 50 MG tablet Take 50 mg by mouth every 6 (six) hours as needed. For pain     venlafaxine XR (EFFEXOR-XR) 150 MG 24 hr capsule Take 150 mg by mouth daily with breakfast.      Vitamin D, Ergocalciferol, (DRISDOL) 1.25 MG (50000 UNIT) CAPS capsule Take 50,000 Units by mouth once a week.     No current facility-administered medications on file prior to visit.   DME CPAP   Appointments: to see pcp [name] in 1 week from today    Plan: Patient agrees to the care plan and follow up within the next 7-10 business days  Pt encouraged to return a call to [facility] RN CM prn   Eloyce Bultman L. Lavina Hamman, RN, BSN, [place] Coordinator Office number [phone] Mobile number [phone]  Main [facility] number [phone] Fax number [phone]

## 2021-05-11 ENCOUNTER — Encounter: Payer: Self-pay | Admitting: *Deleted

## 2021-05-16 ENCOUNTER — Other Ambulatory Visit: Payer: Self-pay | Admitting: *Deleted

## 2021-05-16 DIAGNOSIS — N3000 Acute cystitis without hematuria: Secondary | ICD-10-CM | POA: Diagnosis not present

## 2021-05-16 DIAGNOSIS — R945 Abnormal results of liver function studies: Secondary | ICD-10-CM | POA: Diagnosis not present

## 2021-05-16 DIAGNOSIS — K7460 Unspecified cirrhosis of liver: Secondary | ICD-10-CM | POA: Diagnosis not present

## 2021-05-16 DIAGNOSIS — I10 Essential (primary) hypertension: Secondary | ICD-10-CM | POA: Diagnosis not present

## 2021-05-16 DIAGNOSIS — R531 Weakness: Secondary | ICD-10-CM | POA: Diagnosis not present

## 2021-05-16 DIAGNOSIS — Z6839 Body mass index (BMI) 39.0-39.9, adult: Secondary | ICD-10-CM | POA: Diagnosis not present

## 2021-05-16 DIAGNOSIS — K219 Gastro-esophageal reflux disease without esophagitis: Secondary | ICD-10-CM | POA: Diagnosis not present

## 2021-05-16 DIAGNOSIS — D638 Anemia in other chronic diseases classified elsewhere: Secondary | ICD-10-CM | POA: Diagnosis not present

## 2021-05-16 NOTE — Patient Outreach (Addendum)
[place] [facility]) Care Management  05/16/2021  [name] [name] 05/11/1946 [id number]   [place] coordination- hospital discharge lab values  Outreach to [facility] [phone] after unsuccessful attempt to find pt discharge lab values in EPIC No answer when transferred to the [facility] care management department. [facility] RN CM left HIPAA [facility] Portability and Accountability Act) compliant voicemail message along with CM's contact info.    Plan Collaborate with [place] on pt lab values prn   [place] Timothy L. Lavina Hamman, RN, BSN, [place] Coordinator Office number [phone] Mobile number [phone]  Main [facility] number [phone] Fax number [phone]

## 2021-05-17 ENCOUNTER — Other Ambulatory Visit: Payer: Self-pay

## 2021-05-17 ENCOUNTER — Other Ambulatory Visit: Payer: Self-pay | Admitting: *Deleted

## 2021-05-17 DIAGNOSIS — E1142 Type 2 diabetes mellitus with diabetic polyneuropathy: Secondary | ICD-10-CM | POA: Insufficient documentation

## 2021-05-17 NOTE — Patient Outreach (Signed)
[place] [facility]) Care Management  05/17/2021  Destinae Neubecker [birth year] [id number]   [place] coordination- collaboration with pcp/labs  No response from [place] to primary care provider (PCP) office  Spoke with Juliann Pulse, RN about pt request for copy of hospital labs [place] sent pt an e-mail for the process to access the office portal to access the labs Mitchell Heir for her assistance  Juliann Pulse confirms pt had post hospital follow up on 05/16/21 and is not scheduled to be seen again until September 2022   Plan Patient to be educated and updated on portal access for labs  [place]. Lavina Hamman, RN, BSN, [place] Coordinator Office number [phone] Mobile number [phone]  Main [facility] number [phone] Fax number [phone]

## 2021-05-17 NOTE — Patient Outreach (Signed)
[place] [facility]) Care Management  05/17/2021  [name] [name] 1946-06-26 [id number]  [facility] Telephone Assessment/Screen for post hospital referral   Referral Date: 05/08/21 Referral Source: PING  Referral Reason: post hospital screening/complex care Insurance:medicare - blue cross blue shield supplement No [place] admission [facility] health admission 6/16-18/22 urinary tract infection (UTI) ED visit on 05/01/21  Patient is able to verify HIPAA ([name] and [name]) identifiers Reviewed and addressed the purpose of the follow up call with the patient  Consent: [facility] RN CM reviewed [facility] services with patient. Patient gave verbal consent for services.   Assessment  Hospital discharge labs [facility] RN CM updated patient on outreaches to [facility] hospital RN CM office and to [name] office related to her hospital labs and imaging [name] voiced appreciation She and [name] reports their computer and e-mail services are not accessible at this time- as [facility] RN CM attempted to assist her to access her primary care provider (PCP) office patient portal [name] reports [name] reviewed some results with her during her 05/16/21  office visit [name] follows up with [name] every 3 months  She also reports she was seen by her Gastroenterologist on GI 05/16/21      Recurrent Urinary tract infections (UTI) She completed her antibiotics  She reports she has only been UTI free for 6 months in the last 2 yrs Assessed for home care  She denies postponing voiding She states she attempts to drink fluids or eat ice as she keeps a thermos of ice and water beside her She has not been informed of a fluid restriction  She does confirm she use to drink a lot of sodas, 6- 8-sodas a day and is now taking in 2-3 a day Patting the 3 perineal areas dry reviewed Has a small hemorrhoid but described how she keeps it clean with wipes,  keeps it dry and use cream prn Hx of a bed wetter/urinary incontinence She reports she keep pads on mostly at night Discussed with her how urinary moisture, wiping the perineal the last way, use of powders & sprays can increase risk of UTIs Discussed pelvic floor therapy.home management  She voiced understanding She had 2 children with her daughter as the largest at 8 lbs States she was confused with this admission as she was septic  She states she had been informed she had some dementia but her pcp re-assessed her to clarify she does not have dementia  Hypertension BP  within normal limits (wnl) since d/c < 140/90, had changes in hypertension medications while hospitalized   [facility] services +  Reviewed [facility] services and the [facility] letter/resources with her  Will send EMMIs via mail per pt permission as has no home e-mail services EMMIs sent on UTI in adults, urinary incontinence in females, surgery to treat stress urinary incontinence in women, pelvic floor exercises  Plans Patient agrees to care plan and follow up within the next   Goals Addressed               This Visit's Progress     Patient Stated     [facility]) Keep Skin Clean and Dry (pt-stated)   On track     Follow Up Date 05/25/2021    - clean and dry skin well - use a fragrance-free lotion on skin - wear a protective pad or garment     Notes:  05/17/21 She denies postponing voiding She states she attempts  to drink fluids or eat ice as she keeps a thermos of ice and water beside her, keeps hemorrhoid area clean and dry, wears pads for urinary incontinence at night          Zaeda Mcferran L. Lavina Hamman, RN, BSN, [place] Coordinator Office number [phone] Main [facility] number [phone] Fax number [phone]

## 2021-05-25 DIAGNOSIS — M62551 Muscle wasting and atrophy, not elsewhere classified, right thigh: Secondary | ICD-10-CM | POA: Diagnosis not present

## 2021-05-25 DIAGNOSIS — M62552 Muscle wasting and atrophy, not elsewhere classified, left thigh: Secondary | ICD-10-CM | POA: Diagnosis not present

## 2021-05-25 DIAGNOSIS — R2689 Other abnormalities of gait and mobility: Secondary | ICD-10-CM | POA: Diagnosis not present

## 2021-05-25 DIAGNOSIS — M545 Low back pain, unspecified: Secondary | ICD-10-CM | POA: Diagnosis not present

## 2021-05-29 DIAGNOSIS — R600 Localized edema: Secondary | ICD-10-CM | POA: Diagnosis not present

## 2021-06-01 ENCOUNTER — Other Ambulatory Visit: Payer: Self-pay | Admitting: *Deleted

## 2021-06-01 ENCOUNTER — Other Ambulatory Visit: Payer: Self-pay

## 2021-06-01 DIAGNOSIS — M545 Low back pain, unspecified: Secondary | ICD-10-CM | POA: Diagnosis not present

## 2021-06-01 DIAGNOSIS — M62551 Muscle wasting and atrophy, not elsewhere classified, right thigh: Secondary | ICD-10-CM | POA: Diagnosis not present

## 2021-06-01 DIAGNOSIS — M62552 Muscle wasting and atrophy, not elsewhere classified, left thigh: Secondary | ICD-10-CM | POA: Diagnosis not present

## 2021-06-01 DIAGNOSIS — R2689 Other abnormalities of gait and mobility: Secondary | ICD-10-CM | POA: Diagnosis not present

## 2021-06-01 NOTE — Patient Outreach (Signed)
[place] [facility]) Care Management  06/01/2021  [name] [name] 01/16/1946 [id number]   [facility] Telephone Assessment/Screen for post hospital referral   Referral Date: 05/08/21 Referral Source: PING  Referral Reason: post hospital screening/complex care Insurance:medicare - blue cross blue shield supplement No [name] admission [facility] health admission 6/16-18/22 urinary tract infection (UTI) ED visit on 05/01/21   Patient is able to verify HIPAA ([place] and [place]) identifiers Reviewed and addressed the purpose of the follow up call with the patient   Consent: [facility] ([place]) RN CM reviewed [facility] services with patient. Patient gave verbal consent for services.    Assessment Urinary tract infections (UTI) Has an urologist appointment pending primary care provider (PCP) recently completed labs to inform her she presently does not have a UTI but her magnesium was low She denies all UTI symptoms today  She is going to purchase a magnesium supplement but reports she is still receiving home health therapies and uses a walker for ambulation, therefore has not been out of home to a store  Reviewed magnesium rich foods- Whole grains and dark-green, leafy vegetables are good sources of magnesium. Low-fat milk and yogurt contain magnesium as well. ... Dried beans and legumes (such as soybeans, baked beans, lentils, and peanuts) and nuts (such as almonds and cashews) provide magnesium.  When inquired to send information quicker to her she reports [name] has not fixed their computer at this time Shared with her and answered questions about how to obtain electronic technology like smart phones, notebooks, tablets, laptops. They have access to [employer] for internet access. Pt has use of a flip phone Their grand daughter will be arriving for a visit soon and pt will get her to assist with these needs Answer questions about  downloading Discussed local [place] computer training and free internet access   Plans Patient agrees to care plan and follow up within the next 30 business days Pt encouraged to return a call to [place] CM prn Provided her with RN CM number again  [name], RN, BSN, [place] Coordinator Office number [phone] Main [facility] number [phone] Fax number [phone]

## 2021-06-05 ENCOUNTER — Encounter: Payer: Self-pay | Admitting: *Deleted

## 2021-06-05 DIAGNOSIS — M62551 Muscle wasting and atrophy, not elsewhere classified, right thigh: Secondary | ICD-10-CM | POA: Diagnosis not present

## 2021-06-05 DIAGNOSIS — R2689 Other abnormalities of gait and mobility: Secondary | ICD-10-CM | POA: Diagnosis not present

## 2021-06-05 DIAGNOSIS — M62552 Muscle wasting and atrophy, not elsewhere classified, left thigh: Secondary | ICD-10-CM | POA: Diagnosis not present

## 2021-06-05 DIAGNOSIS — M545 Low back pain, unspecified: Secondary | ICD-10-CM | POA: Diagnosis not present

## 2021-06-08 DIAGNOSIS — R2689 Other abnormalities of gait and mobility: Secondary | ICD-10-CM | POA: Diagnosis not present

## 2021-06-08 DIAGNOSIS — M545 Low back pain, unspecified: Secondary | ICD-10-CM | POA: Diagnosis not present

## 2021-06-08 DIAGNOSIS — M62552 Muscle wasting and atrophy, not elsewhere classified, left thigh: Secondary | ICD-10-CM | POA: Diagnosis not present

## 2021-06-08 DIAGNOSIS — M62551 Muscle wasting and atrophy, not elsewhere classified, right thigh: Secondary | ICD-10-CM | POA: Diagnosis not present

## 2021-06-12 DIAGNOSIS — R2689 Other abnormalities of gait and mobility: Secondary | ICD-10-CM | POA: Diagnosis not present

## 2021-06-12 DIAGNOSIS — M545 Low back pain, unspecified: Secondary | ICD-10-CM | POA: Diagnosis not present

## 2021-06-12 DIAGNOSIS — M62551 Muscle wasting and atrophy, not elsewhere classified, right thigh: Secondary | ICD-10-CM | POA: Diagnosis not present

## 2021-06-12 DIAGNOSIS — M62552 Muscle wasting and atrophy, not elsewhere classified, left thigh: Secondary | ICD-10-CM | POA: Diagnosis not present

## 2021-06-15 DIAGNOSIS — M62552 Muscle wasting and atrophy, not elsewhere classified, left thigh: Secondary | ICD-10-CM | POA: Diagnosis not present

## 2021-06-15 DIAGNOSIS — M545 Low back pain, unspecified: Secondary | ICD-10-CM | POA: Diagnosis not present

## 2021-06-15 DIAGNOSIS — R2689 Other abnormalities of gait and mobility: Secondary | ICD-10-CM | POA: Diagnosis not present

## 2021-06-15 DIAGNOSIS — M62551 Muscle wasting and atrophy, not elsewhere classified, right thigh: Secondary | ICD-10-CM | POA: Diagnosis not present

## 2021-06-19 DIAGNOSIS — M545 Low back pain, unspecified: Secondary | ICD-10-CM | POA: Diagnosis not present

## 2021-06-19 DIAGNOSIS — M62551 Muscle wasting and atrophy, not elsewhere classified, right thigh: Secondary | ICD-10-CM | POA: Diagnosis not present

## 2021-06-19 DIAGNOSIS — M62552 Muscle wasting and atrophy, not elsewhere classified, left thigh: Secondary | ICD-10-CM | POA: Diagnosis not present

## 2021-06-19 DIAGNOSIS — R2689 Other abnormalities of gait and mobility: Secondary | ICD-10-CM | POA: Diagnosis not present

## 2021-06-26 DIAGNOSIS — M62551 Muscle wasting and atrophy, not elsewhere classified, right thigh: Secondary | ICD-10-CM | POA: Diagnosis not present

## 2021-06-26 DIAGNOSIS — R2689 Other abnormalities of gait and mobility: Secondary | ICD-10-CM | POA: Diagnosis not present

## 2021-06-26 DIAGNOSIS — M545 Low back pain, unspecified: Secondary | ICD-10-CM | POA: Diagnosis not present

## 2021-06-26 DIAGNOSIS — M62552 Muscle wasting and atrophy, not elsewhere classified, left thigh: Secondary | ICD-10-CM | POA: Diagnosis not present

## 2021-06-27 DIAGNOSIS — M545 Low back pain, unspecified: Secondary | ICD-10-CM | POA: Diagnosis not present

## 2021-06-27 DIAGNOSIS — M62551 Muscle wasting and atrophy, not elsewhere classified, right thigh: Secondary | ICD-10-CM | POA: Diagnosis not present

## 2021-06-27 DIAGNOSIS — R2689 Other abnormalities of gait and mobility: Secondary | ICD-10-CM | POA: Diagnosis not present

## 2021-06-27 DIAGNOSIS — M62552 Muscle wasting and atrophy, not elsewhere classified, left thigh: Secondary | ICD-10-CM | POA: Diagnosis not present

## 2021-07-03 ENCOUNTER — Other Ambulatory Visit: Payer: Self-pay | Admitting: *Deleted

## 2021-07-03 DIAGNOSIS — M62551 Muscle wasting and atrophy, not elsewhere classified, right thigh: Secondary | ICD-10-CM | POA: Diagnosis not present

## 2021-07-03 DIAGNOSIS — M545 Low back pain, unspecified: Secondary | ICD-10-CM | POA: Diagnosis not present

## 2021-07-03 DIAGNOSIS — R2689 Other abnormalities of gait and mobility: Secondary | ICD-10-CM | POA: Diagnosis not present

## 2021-07-03 DIAGNOSIS — M62552 Muscle wasting and atrophy, not elsewhere classified, left thigh: Secondary | ICD-10-CM | POA: Diagnosis not present

## 2021-07-03 NOTE — Patient Outreach (Addendum)
[place] [facility]) Care Management  07/03/2021  [name] [name] 11-Apr-1946 [id number]   [facility] Telephone follow up for post hospital referral   Referral Date: 05/08/21 Referral Source: PING  Referral Reason: post hospital screening/complex care Insurance:medicare - blue cross blue shield supplement No [place] admission [facility] health admission 6/16-18/22 urinary tract infection (UTI) ED visit on 05/01/21  [facility] Unsuccessful outreach   Outreach attempt to the home number  No answer.    Plan: [facility] RN CM scheduled this patient for another call attempt within 4-7 business days Unsuccessful outreach on 07/03/21   [name] L. Lavina Hamman, RN, BSN, [place] Coordinator Office number [phone] Mobile number [phone]  Main [facility] number [phone] Fax number [phone]

## 2021-07-06 DIAGNOSIS — M62551 Muscle wasting and atrophy, not elsewhere classified, right thigh: Secondary | ICD-10-CM | POA: Diagnosis not present

## 2021-07-06 DIAGNOSIS — R2689 Other abnormalities of gait and mobility: Secondary | ICD-10-CM | POA: Diagnosis not present

## 2021-07-06 DIAGNOSIS — M62552 Muscle wasting and atrophy, not elsewhere classified, left thigh: Secondary | ICD-10-CM | POA: Diagnosis not present

## 2021-07-06 DIAGNOSIS — M545 Low back pain, unspecified: Secondary | ICD-10-CM | POA: Diagnosis not present

## 2021-07-10 ENCOUNTER — Other Ambulatory Visit: Payer: Self-pay

## 2021-07-10 ENCOUNTER — Other Ambulatory Visit: Payer: Self-pay | Admitting: *Deleted

## 2021-07-10 NOTE — Patient Outreach (Signed)
[facility]) Care Management  07/10/2021  [name] [name] 21-May-1946 [id number]  [facility] follow up outreach  [name] [name] was referred on 05/08/21 to [facility] via [place] for  Referral Reason: post hospital screening/complex care Insurance:medicare - blue cross blue shield supplement No [place] admission [facility] health admission 6/16-18/22 urinary tract infection (UTI) ED visit on 05/01/21  She is able to verify HIPAA identifiers Follow up outreach  [name] [name] reports she is doing good she states she has been attending PT sessions twice a week  She reports continue issue with urinary tract infections (UTI) She is followed for this by Dr Nila Nephew, urology and his NP,  Sherlyn Lees She states she is allergic to a lot antibiotics and Levaquin is the only one so far that seems to work but with side effects Has not seen an infectious disease provider Questions were answered about infectious disease providers    Attends an appointment for an urine screen on 07/11/21   RN CM outreached to Dr Nila Nephew, urology and his NP,  Sherlyn Lees [phone]  a message was left for the nurse  Plans Patient agrees to care plan and follow up within the next 30 business days  Goals Addressed               This Visit's Progress     Patient Stated     [facility]) Keep Skin Clean and Dry (UTIs) (pt-stated)   Not on track     Follow Up Date 08/04/2021    - clean and dry skin well - use a fragrance-free lotion on skin - wear a protective pad or garment   Notes:  07/10/21 another UTI since last outreach, to be seen for urine sample on 07/11/21 reports keeping clean and dry but levaquin is at this time the only antibiotic she is not allergic to that helps, asked questions about infectious disease providers and voiced interest in a referral to one. Permission given to RN CM to outreach to her urology staff  07/03/21 unsuccessful outreach  06/01/21 doing better, no UTI symptoms, Keeping clean  and dry 05/17/21 She denies postponing voiding She states she attempts to drink fluids or eat ice as she keeps a thermos of ice and water beside her, keeps hemorrhoid area clean and dry, wears pads for urinary incontinence at night          Tyland Klemens L. Lavina Hamman, RN, BSN, [place] Coordinator Office number [phone] Main [facility] number [phone] Fax number [phone]

## 2021-07-11 DIAGNOSIS — N390 Urinary tract infection, site not specified: Secondary | ICD-10-CM | POA: Diagnosis not present

## 2021-07-11 DIAGNOSIS — N3281 Overactive bladder: Secondary | ICD-10-CM | POA: Diagnosis not present

## 2021-07-11 DIAGNOSIS — Z79899 Other long term (current) drug therapy: Secondary | ICD-10-CM | POA: Diagnosis not present

## 2021-07-12 DIAGNOSIS — M62551 Muscle wasting and atrophy, not elsewhere classified, right thigh: Secondary | ICD-10-CM | POA: Diagnosis not present

## 2021-07-12 DIAGNOSIS — M545 Low back pain, unspecified: Secondary | ICD-10-CM | POA: Diagnosis not present

## 2021-07-12 DIAGNOSIS — R2689 Other abnormalities of gait and mobility: Secondary | ICD-10-CM | POA: Diagnosis not present

## 2021-07-12 DIAGNOSIS — M62552 Muscle wasting and atrophy, not elsewhere classified, left thigh: Secondary | ICD-10-CM | POA: Diagnosis not present

## 2021-07-14 DIAGNOSIS — M62552 Muscle wasting and atrophy, not elsewhere classified, left thigh: Secondary | ICD-10-CM | POA: Diagnosis not present

## 2021-07-14 DIAGNOSIS — M545 Low back pain, unspecified: Secondary | ICD-10-CM | POA: Diagnosis not present

## 2021-07-14 DIAGNOSIS — M62551 Muscle wasting and atrophy, not elsewhere classified, right thigh: Secondary | ICD-10-CM | POA: Diagnosis not present

## 2021-07-14 DIAGNOSIS — R2689 Other abnormalities of gait and mobility: Secondary | ICD-10-CM | POA: Diagnosis not present

## 2021-07-19 DIAGNOSIS — M62552 Muscle wasting and atrophy, not elsewhere classified, left thigh: Secondary | ICD-10-CM | POA: Diagnosis not present

## 2021-07-19 DIAGNOSIS — M545 Low back pain, unspecified: Secondary | ICD-10-CM | POA: Diagnosis not present

## 2021-07-19 DIAGNOSIS — I10 Essential (primary) hypertension: Secondary | ICD-10-CM | POA: Diagnosis not present

## 2021-07-19 DIAGNOSIS — E7800 Pure hypercholesterolemia, unspecified: Secondary | ICD-10-CM | POA: Diagnosis not present

## 2021-07-19 DIAGNOSIS — E1142 Type 2 diabetes mellitus with diabetic polyneuropathy: Secondary | ICD-10-CM | POA: Diagnosis not present

## 2021-07-19 DIAGNOSIS — R2689 Other abnormalities of gait and mobility: Secondary | ICD-10-CM | POA: Diagnosis not present

## 2021-07-19 DIAGNOSIS — M62551 Muscle wasting and atrophy, not elsewhere classified, right thigh: Secondary | ICD-10-CM | POA: Diagnosis not present

## 2021-07-21 DIAGNOSIS — M62551 Muscle wasting and atrophy, not elsewhere classified, right thigh: Secondary | ICD-10-CM | POA: Diagnosis not present

## 2021-07-21 DIAGNOSIS — M62552 Muscle wasting and atrophy, not elsewhere classified, left thigh: Secondary | ICD-10-CM | POA: Diagnosis not present

## 2021-07-21 DIAGNOSIS — M545 Low back pain, unspecified: Secondary | ICD-10-CM | POA: Diagnosis not present

## 2021-07-21 DIAGNOSIS — R2689 Other abnormalities of gait and mobility: Secondary | ICD-10-CM | POA: Diagnosis not present

## 2021-07-27 DIAGNOSIS — E7800 Pure hypercholesterolemia, unspecified: Secondary | ICD-10-CM | POA: Diagnosis not present

## 2021-07-27 DIAGNOSIS — I10 Essential (primary) hypertension: Secondary | ICD-10-CM | POA: Diagnosis not present

## 2021-07-27 DIAGNOSIS — Z6839 Body mass index (BMI) 39.0-39.9, adult: Secondary | ICD-10-CM | POA: Diagnosis not present

## 2021-07-27 DIAGNOSIS — G4730 Sleep apnea, unspecified: Secondary | ICD-10-CM | POA: Diagnosis not present

## 2021-07-27 DIAGNOSIS — E1142 Type 2 diabetes mellitus with diabetic polyneuropathy: Secondary | ICD-10-CM | POA: Diagnosis not present

## 2021-07-27 DIAGNOSIS — I517 Cardiomegaly: Secondary | ICD-10-CM | POA: Insufficient documentation

## 2021-07-27 DIAGNOSIS — I4891 Unspecified atrial fibrillation: Secondary | ICD-10-CM | POA: Diagnosis not present

## 2021-07-27 DIAGNOSIS — E038 Other specified hypothyroidism: Secondary | ICD-10-CM | POA: Diagnosis not present

## 2021-07-28 DIAGNOSIS — R2689 Other abnormalities of gait and mobility: Secondary | ICD-10-CM | POA: Diagnosis not present

## 2021-07-28 DIAGNOSIS — M62552 Muscle wasting and atrophy, not elsewhere classified, left thigh: Secondary | ICD-10-CM | POA: Diagnosis not present

## 2021-07-28 DIAGNOSIS — M62551 Muscle wasting and atrophy, not elsewhere classified, right thigh: Secondary | ICD-10-CM | POA: Diagnosis not present

## 2021-07-28 DIAGNOSIS — M545 Low back pain, unspecified: Secondary | ICD-10-CM | POA: Diagnosis not present

## 2021-07-31 DIAGNOSIS — R2689 Other abnormalities of gait and mobility: Secondary | ICD-10-CM | POA: Diagnosis not present

## 2021-07-31 DIAGNOSIS — M62551 Muscle wasting and atrophy, not elsewhere classified, right thigh: Secondary | ICD-10-CM | POA: Diagnosis not present

## 2021-07-31 DIAGNOSIS — M545 Low back pain, unspecified: Secondary | ICD-10-CM | POA: Diagnosis not present

## 2021-07-31 DIAGNOSIS — M62552 Muscle wasting and atrophy, not elsewhere classified, left thigh: Secondary | ICD-10-CM | POA: Diagnosis not present

## 2021-08-02 ENCOUNTER — Other Ambulatory Visit: Payer: Self-pay | Admitting: Cardiology

## 2021-08-03 ENCOUNTER — Other Ambulatory Visit: Payer: Self-pay

## 2021-08-03 ENCOUNTER — Ambulatory Visit (INDEPENDENT_AMBULATORY_CARE_PROVIDER_SITE_OTHER): Payer: Medicare Other | Admitting: Cardiology

## 2021-08-03 ENCOUNTER — Encounter: Payer: Self-pay | Admitting: Cardiology

## 2021-08-03 VITALS — BP 138/72 | HR 80 | Ht 61.0 in | Wt 211.8 lb

## 2021-08-03 DIAGNOSIS — I2510 Atherosclerotic heart disease of native coronary artery without angina pectoris: Secondary | ICD-10-CM | POA: Diagnosis not present

## 2021-08-03 DIAGNOSIS — Z95818 Presence of other cardiac implants and grafts: Secondary | ICD-10-CM

## 2021-08-03 DIAGNOSIS — I5032 Chronic diastolic (congestive) heart failure: Secondary | ICD-10-CM | POA: Diagnosis not present

## 2021-08-03 DIAGNOSIS — K7460 Unspecified cirrhosis of liver: Secondary | ICD-10-CM

## 2021-08-03 DIAGNOSIS — I110 Hypertensive heart disease with heart failure: Secondary | ICD-10-CM | POA: Diagnosis not present

## 2021-08-03 DIAGNOSIS — I482 Chronic atrial fibrillation, unspecified: Secondary | ICD-10-CM

## 2021-08-03 DIAGNOSIS — E782 Mixed hyperlipidemia: Secondary | ICD-10-CM | POA: Diagnosis not present

## 2021-08-03 NOTE — Patient Instructions (Signed)

## 2021-08-03 NOTE — Progress Notes (Signed)
Cardiology Office Note:    Date:  08/03/2021   ID:  [name] [name], DOB 05-Sep-1946, MRN [id number]  PCP:  [name] Right, MD  Cardiologist:  [name] More, MD    Referring MD: [name] Right, MD    ASSESSMENT:    1. Chronic atrial fibrillation ([place])   2. Presence of Watchman left atrial appendage closure device   3. Hypertensive heart disease with chronic diastolic congestive heart failure ([place])   4. Mild CAD   5. Mixed hyperlipidemia   6. Idiopathic cirrhosis (HCC)    PLAN:    In order of problems listed above:  [name] [name] continues to do remarkably well she has rate controlled atrial fibrillation she will continue her beta-blocker and does not require anticoagulation after watchman procedure Blood pressure target continue current regimen heart failure is nicely compensated she takes a very low-dose loop diuretic EF remains normal. Continue her statin with mild CAD stable having no anginal discomfort I be extremely reluctant to consider revascularization with the severity of her liver disease I would not do an ischemia evaluation at this time she is done well with her medical regimen including calcium channel blocker beta-blocker oral nitrate and low-dose of statin. She is aware of the severity of her liver disease she is in the moderate stage IV I think in combination of her liver disease and urinary tract infection was the cause of her transient delirium.  She continues to follow closely with GI Dr. Melina [name].   Next appointment: 9 months   Medication Adjustments/Labs and Tests Ordered: Current medicines are reviewed at length with the patient today.  Concerns regarding medicines are outlined above.  No orders of the defined types were placed in this encounter.  No orders of the defined types were placed in this encounter.   Chief Complaint  Patient presents with   Follow-up   Atrial Fibrillation   Congestive Heart Failure   Hypertension    History of Present Illness:     [name] [name] is a 75 y.o. female with a hx of chronic atrial fibrillation with watchman device left atrial appendage closure because of high risk for anticoagulation with cirrhosis mild CAD hyperlipidemia and hypertensive heart disease with chronic diastolic heart failure.  She wa tos last seen 01/31/2021 showing ejection fraction of 55 to 60% severe left atrial enlargement mild [name] atrial enlargement and mild mitral and tricuspid regurgitation.  Compliance with diet, lifestyle and medications: Yes  Was admitted to [facility] on 07/02/2021 with a discharge diagnosis of dementia weakness urinary tract infection.  She is anemic hemoglobin 9.9 leukopenic white count 3000 thrombocytopenic platelets 214,000 creatinine 0.8 potassium 3.0 she had serial troponins checked during hospitalization normal proBNP level elevated 2270 she also had tan echocardiogram performed showing atrial fibrillation with a relatively slow rate 50 to 55 bpm grossly normal left ventricular size and function mild mitral and tricuspid regurgitation and severe left atrium.  She was not seen by cardiology and there were no cardiac issues addressed during hospitalization.  She is a very insightful intelligent woman and tells me she was confused delirium during hospitalization with a urinary tract infection and underlying liver disease.  Her thought process quickly cleared and since then she has done well no edema shortness of breath chest pain palpitation or syncope and she is not on antiplatelet or anticoagulant therapy after having left atrial occlusion for atrial fibrillation. Past Medical History:  Diagnosis Date   Allergic rhinitis    Anxiety    ANXIETY 12/26/2007  Qualifier: Diagnosis of  By: Ronnald Ramp CNA/MA, Jessica     Arthritis    Bronchitis    Chest pain in adult 12/24/2018   CHF (congestive heart failure) (HCC)    CHF, MILD 12/26/2007   Qualifier: Diagnosis of  By: Ronnald Ramp CNA/MA, Jessica     Chronic atrial  fibrillation ([place]) 04/24/2016   Watchman atrial appendage device placed at [facility] for clot pevention   Chronic diastolic heart failure (HCC)    Chronic liver disease 03/15/2016   Closed fracture of proximal tibia [id number]   Complication of anesthesia    low blood pressure once   Depression    Enlarged heart    Esophageal reflux 07/04/2017   Essential hypertension 12/26/2007   Qualifier: Diagnosis of  By: Ronnald Ramp CNA/MA, Janett Billow     Fibromyalgia    GERD (gastroesophageal reflux disease)    HTN (hypertension)    on medication since age 26   Hypercholesteremia    Hyperlipidemia 02/29/2016   Hypertensive heart disease    on medication since age 70   Hypothyroidism 03/20/2016   Idiopathic cirrhosis ([place])    stage 4; sees Dr. Melina [name] in  [place]   Mild CAD 02/29/2016   Myocardial infarction [facility])    age 32   Neuropathy, peripheral    lower extremities   OSA (obstructive sleep apnea)    OSA on CPAP 07/04/2008   CPAP [facility]/ [facility]    Presence of Watchman left atrial appendage closure device 01/30/2018   Seasonal and perennial allergic rhinitis 01/23/2011   Allergy vaccine restarted at 1:50 08/02/2011 [place], [place] 2016    Simple chronic bronchitis ([place]) 12/26/2007   PFT 07/26/09-mild restriction and reduction of diffusion. No obstruction, no response to dilator    Sleep apnea    Somnolence    SOMNOLENCE 12/26/2007   Annotation: excessive daytime Qualifier: Diagnosis of  By: Ronnald Ramp CNA/MA, Jessica     Splenomegaly    Swelling of joint, knee, [name] 01/30/2018   Type 2 diabetes mellitus ([place]) 12/29/2007   Qualifier: Diagnosis of  By: Annamaria Boots MD, [place] D    Type 2 diabetes mellitus without complications ([place])    Type II or unspecified type diabetes mellitus without mention of complication, not stated as uncontrolled    Urinary incontinence, nocturnal enuresis    Urinary, incontinence, stress female    wears depends    Past Surgical History:  Procedure Laterality Date   ABDOMINAL HYSTERECTOMY      APPENDECTOMY     BACK SURGERY     CARPAL TUNNEL RELEASE     bilaterally   CATARACT EXTRACTION W/ INTRAOCULAR LENS  IMPLANT, BILATERAL     DILATION AND CURETTAGE OF UTERUS     EYE SURGERY     cataract ext/ iol implants   KNEE ARTHROSCOPY     LUMBAR LAMINECTOMY  03/2011; 01/2012   MOUTH SURGERY     SPLENECTOMY     TONSILLECTOMY AND ADENOIDECTOMY     TOTAL ABDOMINAL HYSTERECTOMY      Current Medications: Current Meds  Medication Sig   acarbose (PRECOSE) 100 MG tablet Take 100 mg by mouth 3 (three) times daily with meals.   alendronate (FOSAMAX) 70 MG tablet Take 70 mg by mouth once a week.    amLODipine (NORVASC) 5 MG tablet Take 5 mg by mouth 2 (two) times daily.   amoxicillin (AMOXIL) 500 MG capsule TAKE 4 CAPSULES 1 HOUR PRIOR TO DENTAL APPOINTMENT.   buPROPion (WELLBUTRIN XL) 300 MG 24 hr tablet Take 300 mg by  mouth daily.   carvedilol (COREG) 6.25 MG tablet Take 6.25 mg by mouth in the morning and at bedtime.   ciprofloxacin (CIPRO) 250 MG tablet    clindamycin (CLEOCIN) 150 MG capsule    doxazosin (CARDURA) 4 MG tablet TAKE 1 TABLET BY MOUTH DAILY   furosemide (LASIX) 20 MG tablet TAKE 1 TABLET BY MOUTH FOR 2 DAYS THEN TAKE ONLY ON MONDAYS, WEDNESDAYS AND FRIDAYS   gabapentin (NEURONTIN) 600 MG tablet Take 600 mg by mouth 3 (three) times daily.    HYDROcodone-acetaminophen (NORCO/VICODIN) 5-325 MG tablet    irbesartan (AVAPRO) 300 MG tablet Take 300 mg by mouth daily.   isosorbide mononitrate (IMDUR) 60 MG 24 hr tablet Take 60 mg by mouth daily.   levothyroxine (SYNTHROID, LEVOTHROID) 125 MCG tablet Take 125 mcg by mouth daily before breakfast.    metFORMIN (GLUCOPHAGE-XR) 500 MG 24 hr tablet Take 500 mg by mouth 2 (two) times daily.    nabumetone (RELAFEN) 500 MG tablet Take 500 mg by mouth in the morning and at bedtime.   NITROSTAT 0.4 MG SL tablet Place 0.4 mg under the tongue every 5 (five) minutes as needed for chest pain.    omeprazole (PRILOSEC) 40 MG capsule Take 1  capsule by mouth 2 (two) times daily.   potassium chloride SA (K-DUR,KLOR-CON) 20 MEQ tablet Take 40 mEq by mouth 3 (three) times daily.    rosuvastatin (CRESTOR) 5 MG tablet TAKE 1 TABLET BY MOUTH DAILY AT 6 PM.   TOVIAZ 4 MG TB24 tablet Take 1 tablet by mouth daily.   traMADol (ULTRAM) 50 MG tablet Take 50 mg by mouth every 6 (six) hours as needed. For pain   venlafaxine XR (EFFEXOR-XR) 150 MG 24 hr capsule Take 150 mg by mouth daily with breakfast.    Vitamin D, Ergocalciferol, (DRISDOL) 1.25 MG (50000 UNIT) CAPS capsule Take 50,000 Units by mouth once a week.     Allergies:   Azithromycin, Cefuroxime axetil, Celecoxib, Codeine, Sulfa antibiotics, Amlodipine, Canagliflozin, Cefuroxime, Exenatide, Hydralazine hcl, Other, and Norvasc [amlodipine besylate]   Social History   Socioeconomic History   Marital status: Married    Spouse name: Richard   Number of children: Not on file   Years of education: Not on file   Highest education level: Not on file  Occupational History   Not on file  Tobacco Use   Smoking status: Never   Smokeless tobacco: Never  Vaping Use   Vaping Use: Never used  Substance and Sexual Activity   Alcohol use: No   Drug use: No   Sexual activity: Not Currently  Other Topics Concern   Not on file  Social History Narrative   Husband has neurological  issues seen by 4 neuro no dx essential tremors r/o parkinson   Social Determinants of Health   Financial Resource Strain: Not on file  Food Insecurity: No Food Insecurity   Worried About Charity fundraiser in the Last Year: Never true   [place] in the Last Year: Never true  Transportation Needs: No Transportation Needs   Lack of Transportation (Medical): No   Lack of Transportation (Non-Medical): No  Physical Activity: Not on file  Stress: Not on file  Social Connections: Not on file     Family History: The patient's family history includes Anesthesia problems in her mother; CAD in her father;  Cancer in her mother; Diabetes in her paternal grandmother; Emphysema in her mother; Heart attack in her father; Hypertension in  her father; Rheum arthritis in her mother; Stroke in her father. ROS:   Please see the history of present illness.    All other systems reviewed and are negative.  EKGs/Labs/Other Studies Reviewed:    The following studies were reviewed today:  EKG:  EKG ordered today and personally reviewed.  The ekg ordered today demonstrates   Recent Labs: No results found for requested labs within last 8760 hours.  Recent Lipid Panel    Component Value Date/Time   CHOL 117 12/24/2018 1137   TRIG 151 (H) 12/24/2018 1137   HDL 39 (L) 12/24/2018 1137   CHOLHDL 3.0 12/24/2018 1137   LDLCALC 48 12/24/2018 1137    Physical Exam:    VS:  BP 138/72 (BP Location: Left Arm, Patient Position: Sitting, Cuff Size: Large)   Pulse 80   Ht '5\' 1"'$  (1.549 m)   Wt 211 lb 12.8 oz (96.1 kg)   SpO2 96%   BMI 40.02 kg/m     Wt Readings from Last 3 Encounters:  08/03/21 211 lb 12.8 oz (96.1 kg)  01/31/21 213 lb 12.8 oz (97 kg)  01/23/21 208 lb 12.8 oz (94.7 kg)     GEN:'s had appreciable weight loss her thought process and mentation is clear well nourished, well developed in no acute distress HEENT: Normal NECK: No JVD; No carotid bruits LYMPHATICS: No lymphadenopathy CARDIAC: RRR, no murmurs, rubs, gallops RESPIRATORY:  Clear to auscultation without rales, wheezing or rhonchi  ABDOMEN: Soft, non-tender, non-distended MUSCULOSKELETAL:  No edema; No deformity  SKIN: Warm and dry NEUROLOGIC:  Alert and oriented x 3 PSYCHIATRIC:  Normal affect    Signed, [name] More, MD  08/03/2021 10:00 AM    [facility]

## 2021-08-04 ENCOUNTER — Other Ambulatory Visit: Payer: Self-pay | Admitting: *Deleted

## 2021-08-04 ENCOUNTER — Encounter: Payer: Self-pay | Admitting: *Deleted

## 2021-08-04 ENCOUNTER — Ambulatory Visit: Payer: Medicare Other | Admitting: Cardiology

## 2021-08-04 DIAGNOSIS — M62552 Muscle wasting and atrophy, not elsewhere classified, left thigh: Secondary | ICD-10-CM | POA: Diagnosis not present

## 2021-08-04 DIAGNOSIS — N390 Urinary tract infection, site not specified: Secondary | ICD-10-CM

## 2021-08-04 DIAGNOSIS — M545 Low back pain, unspecified: Secondary | ICD-10-CM | POA: Diagnosis not present

## 2021-08-04 DIAGNOSIS — M62551 Muscle wasting and atrophy, not elsewhere classified, right thigh: Secondary | ICD-10-CM | POA: Diagnosis not present

## 2021-08-04 DIAGNOSIS — R2689 Other abnormalities of gait and mobility: Secondary | ICD-10-CM | POA: Diagnosis not present

## 2021-08-04 HISTORY — DX: Urinary tract infection, site not specified: N39.0

## 2021-08-04 NOTE — Patient Outreach (Signed)
[facility]) Care Management  08/04/2021  [name] [name] Jul 01, 1946 [id number]  [facility] follow up outreach  [name] was referred on 05/08/21 to [facility] via [place] for  Referral Reason: post hospital screening/complex care Insurance:medicare - blue cross blue shield supplement No [place] admission [facility] health admission 6/16-18/22 urinary tract infection (UTI) ED visit on 05/01/21   She is able to verify HIPAA identifiers    Follow up outreach assessment  Recurrent urinary tract infection (UTI) Appointment to be seen soon Took Macrobid as preventative measures for urinary tract infection (UTI)  She feels better  Reports her urine is now clear and she is not experiencing burning  She is having a better urinary stream/output  RN CM re-reviewed perineal care (wipe back front to back, no continuous perineal pads, hydration, etc)    She and her husband have purchased a tablet to use for internet browsing but are still learning how to use it  She still has not created an e-mail account but her husband has   Falls  no falls lately, fell almost every week prior to her last admission She reports when she falls she is not able to get up related to lack of cartilage of knees  She and her husband have generally called the fire dept for assistance in getting up but some rules have changed and now rely on neighbors no falls in last months  last fall was 2-3 month ago without injury RN CM will send her EMMI education on how to get up from a fall   She uses her w/c when falls become a repeated issue for safety Fear of falling, loss of independence and pain acknowledged  Pain  She generally reports pain when she arises in the mornings at pain level of a 8  Has mattress rail, rails in bathroom   Husband not well either-tremors that were not diagnosed as Parkinson  He has trouble feeding self and reports moments of double vision He is seen by  neurology She has been given a list of Personal care services (PCS) aide by a friend, She reports they will be able to afford charges of $22/hour private pay for services    Pt has bilateral braces pt can stand and walk with rolator  Has a program she participates with via Dr Florina Ou office to monitor her blood pressure (BP)   [facility] progression  Pt prefers [facility] disease management referral   Offered guidance to her in setting up her my chart using Your Activation Code is: [email or web address] Expires: 09/17/2021 10:02 AM Go to [email or web address] or download the MyChart app. Go to the app store, search "MyChart", open the app, select [place], and activate your account. Need technical help? Call [phone]- CHART.    Patient Active Problem List   Diagnosis Date Noted   Enlarged heart 07/27/2021   Morbid obesity ([place]) 05/17/2021   Polyneuropathy due to type 2 diabetes mellitus ([place]) 05/17/2021   Bleeding skin mole 05/07/2021   Sleep apnea    HTN (hypertension)    CHF (congestive heart failure) (HCC)    Bronchitis    Chest pain in adult 12/24/2018   Closed fracture of proximal tibia 03/13/2018   Swelling of joint, knee, right 01/30/2018   Presence of Watchman left atrial appendage closure device 01/30/2018   Esophageal reflux 07/04/2017   Urinary, incontinence, stress female    Urinary incontinence, nocturnal enuresis    Type 2 diabetes mellitus without complications ([place])  Splenomegaly    Somnolence    OSA (obstructive sleep apnea)    Neuropathy, peripheral    Myocardial infarction (HCC)    Idiopathic cirrhosis (HCC)    Hypercholesteremia    Hypertensive heart disease    GERD (gastroesophageal reflux disease)    Fibromyalgia    Coronary artery disease    Complication of anesthesia    Chronic diastolic heart failure (HCC)    Arthritis    Anxiety    Chronic atrial fibrillation ([place]) 04/24/2016   Hypothyroidism 03/20/2016   Chronic liver  disease 03/15/2016   Hyperlipidemia 02/29/2016   Mild CAD 02/29/2016   Seasonal and perennial allergic rhinitis 01/23/2011   Type 2 diabetes mellitus ([place]) 12/29/2007   ANXIETY 12/26/2007   Depression 12/26/2007   Essential hypertension 12/26/2007   CHF, MILD 12/26/2007   Simple chronic bronchitis ([place]) 12/26/2007   SOMNOLENCE 12/26/2007    Plans Patient agrees to care plan and  referral to [facility] disease management for further [facility] services   [place]. Lavina Hamman, RN, BSN, [place] Coordinator Office number [phone] Main [facility] number [phone] Fax number [phone]

## 2021-08-07 DIAGNOSIS — M62551 Muscle wasting and atrophy, not elsewhere classified, right thigh: Secondary | ICD-10-CM | POA: Diagnosis not present

## 2021-08-07 DIAGNOSIS — R2689 Other abnormalities of gait and mobility: Secondary | ICD-10-CM | POA: Diagnosis not present

## 2021-08-07 DIAGNOSIS — M62552 Muscle wasting and atrophy, not elsewhere classified, left thigh: Secondary | ICD-10-CM | POA: Diagnosis not present

## 2021-08-07 DIAGNOSIS — M545 Low back pain, unspecified: Secondary | ICD-10-CM | POA: Diagnosis not present

## 2021-08-08 ENCOUNTER — Encounter: Payer: Self-pay | Admitting: *Deleted

## 2021-08-10 DIAGNOSIS — M62551 Muscle wasting and atrophy, not elsewhere classified, right thigh: Secondary | ICD-10-CM | POA: Diagnosis not present

## 2021-08-10 DIAGNOSIS — R2689 Other abnormalities of gait and mobility: Secondary | ICD-10-CM | POA: Diagnosis not present

## 2021-08-10 DIAGNOSIS — M545 Low back pain, unspecified: Secondary | ICD-10-CM | POA: Diagnosis not present

## 2021-08-10 DIAGNOSIS — M62552 Muscle wasting and atrophy, not elsewhere classified, left thigh: Secondary | ICD-10-CM | POA: Diagnosis not present

## 2021-08-14 DIAGNOSIS — N390 Urinary tract infection, site not specified: Secondary | ICD-10-CM | POA: Diagnosis not present

## 2021-08-14 DIAGNOSIS — N3281 Overactive bladder: Secondary | ICD-10-CM | POA: Diagnosis not present

## 2021-08-15 DIAGNOSIS — M62551 Muscle wasting and atrophy, not elsewhere classified, right thigh: Secondary | ICD-10-CM | POA: Diagnosis not present

## 2021-08-15 DIAGNOSIS — M545 Low back pain, unspecified: Secondary | ICD-10-CM | POA: Diagnosis not present

## 2021-08-15 DIAGNOSIS — R2689 Other abnormalities of gait and mobility: Secondary | ICD-10-CM | POA: Diagnosis not present

## 2021-08-15 DIAGNOSIS — M62552 Muscle wasting and atrophy, not elsewhere classified, left thigh: Secondary | ICD-10-CM | POA: Diagnosis not present

## 2021-08-18 DIAGNOSIS — M62551 Muscle wasting and atrophy, not elsewhere classified, right thigh: Secondary | ICD-10-CM | POA: Diagnosis not present

## 2021-08-18 DIAGNOSIS — M545 Low back pain, unspecified: Secondary | ICD-10-CM | POA: Diagnosis not present

## 2021-08-18 DIAGNOSIS — M62552 Muscle wasting and atrophy, not elsewhere classified, left thigh: Secondary | ICD-10-CM | POA: Diagnosis not present

## 2021-08-18 DIAGNOSIS — R2689 Other abnormalities of gait and mobility: Secondary | ICD-10-CM | POA: Diagnosis not present

## 2021-08-25 ENCOUNTER — Other Ambulatory Visit: Payer: Self-pay | Admitting: *Deleted

## 2021-08-25 DIAGNOSIS — R197 Diarrhea, unspecified: Secondary | ICD-10-CM | POA: Diagnosis not present

## 2021-08-30 DIAGNOSIS — M62552 Muscle wasting and atrophy, not elsewhere classified, left thigh: Secondary | ICD-10-CM | POA: Diagnosis not present

## 2021-08-30 DIAGNOSIS — M545 Low back pain, unspecified: Secondary | ICD-10-CM | POA: Diagnosis not present

## 2021-08-30 DIAGNOSIS — M62551 Muscle wasting and atrophy, not elsewhere classified, right thigh: Secondary | ICD-10-CM | POA: Diagnosis not present

## 2021-08-30 DIAGNOSIS — R2689 Other abnormalities of gait and mobility: Secondary | ICD-10-CM | POA: Diagnosis not present

## 2021-08-31 DIAGNOSIS — H04123 Dry eye syndrome of bilateral lacrimal glands: Secondary | ICD-10-CM | POA: Diagnosis not present

## 2021-08-31 DIAGNOSIS — E113212 Type 2 diabetes mellitus with mild nonproliferative diabetic retinopathy with macular edema, left eye: Secondary | ICD-10-CM | POA: Diagnosis not present

## 2021-08-31 DIAGNOSIS — E113291 Type 2 diabetes mellitus with mild nonproliferative diabetic retinopathy without macular edema, right eye: Secondary | ICD-10-CM | POA: Diagnosis not present

## 2021-09-04 ENCOUNTER — Other Ambulatory Visit: Payer: Self-pay | Admitting: *Deleted

## 2021-09-04 DIAGNOSIS — M545 Low back pain, unspecified: Secondary | ICD-10-CM | POA: Diagnosis not present

## 2021-09-04 DIAGNOSIS — M62551 Muscle wasting and atrophy, not elsewhere classified, right thigh: Secondary | ICD-10-CM | POA: Diagnosis not present

## 2021-09-04 DIAGNOSIS — R2689 Other abnormalities of gait and mobility: Secondary | ICD-10-CM | POA: Diagnosis not present

## 2021-09-04 DIAGNOSIS — M62552 Muscle wasting and atrophy, not elsewhere classified, left thigh: Secondary | ICD-10-CM | POA: Diagnosis not present

## 2021-09-04 NOTE — Patient Outreach (Signed)
[place] [facility]) Care Management  09/04/2021  Darrah Dredge 11-Sep-1946 [id number]   RN Health Coach attempted follow up outreach call to patient.  Patient was unavailable. HIPPA compliance voicemail message left with return callback number.  Plan: RN will call patient again within 30 days.  [place] Care Management [phone]

## 2021-09-07 DIAGNOSIS — R2689 Other abnormalities of gait and mobility: Secondary | ICD-10-CM | POA: Diagnosis not present

## 2021-09-07 DIAGNOSIS — M62551 Muscle wasting and atrophy, not elsewhere classified, right thigh: Secondary | ICD-10-CM | POA: Diagnosis not present

## 2021-09-07 DIAGNOSIS — M545 Low back pain, unspecified: Secondary | ICD-10-CM | POA: Diagnosis not present

## 2021-09-07 DIAGNOSIS — M62552 Muscle wasting and atrophy, not elsewhere classified, left thigh: Secondary | ICD-10-CM | POA: Diagnosis not present

## 2021-09-11 DIAGNOSIS — R2242 Localized swelling, mass and lump, left lower limb: Secondary | ICD-10-CM | POA: Diagnosis not present

## 2021-09-11 DIAGNOSIS — R2241 Localized swelling, mass and lump, right lower limb: Secondary | ICD-10-CM | POA: Diagnosis not present

## 2021-09-11 DIAGNOSIS — I509 Heart failure, unspecified: Secondary | ICD-10-CM | POA: Diagnosis not present

## 2021-09-18 DIAGNOSIS — E7800 Pure hypercholesterolemia, unspecified: Secondary | ICD-10-CM | POA: Diagnosis not present

## 2021-09-18 DIAGNOSIS — E1142 Type 2 diabetes mellitus with diabetic polyneuropathy: Secondary | ICD-10-CM | POA: Diagnosis not present

## 2021-09-18 DIAGNOSIS — I10 Essential (primary) hypertension: Secondary | ICD-10-CM | POA: Diagnosis not present

## 2021-09-21 DIAGNOSIS — E1142 Type 2 diabetes mellitus with diabetic polyneuropathy: Secondary | ICD-10-CM | POA: Diagnosis not present

## 2021-09-21 DIAGNOSIS — E1165 Type 2 diabetes mellitus with hyperglycemia: Secondary | ICD-10-CM | POA: Diagnosis not present

## 2021-09-21 DIAGNOSIS — E039 Hypothyroidism, unspecified: Secondary | ICD-10-CM | POA: Diagnosis not present

## 2021-09-21 DIAGNOSIS — Z7984 Long term (current) use of oral hypoglycemic drugs: Secondary | ICD-10-CM | POA: Diagnosis not present

## 2021-09-21 DIAGNOSIS — I2510 Atherosclerotic heart disease of native coronary artery without angina pectoris: Secondary | ICD-10-CM | POA: Diagnosis not present

## 2021-09-29 DIAGNOSIS — Z7984 Long term (current) use of oral hypoglycemic drugs: Secondary | ICD-10-CM | POA: Diagnosis not present

## 2021-09-29 DIAGNOSIS — E1142 Type 2 diabetes mellitus with diabetic polyneuropathy: Secondary | ICD-10-CM | POA: Diagnosis not present

## 2021-10-03 ENCOUNTER — Other Ambulatory Visit: Payer: Self-pay | Admitting: *Deleted

## 2021-10-03 NOTE — Patient Outreach (Signed)
[facility]) Care Management [place] Note   10/03/2021 Name:  [name] [name] MRN:  [id number] DOB:  1946-08-18  Summary: Patient checked her fasting blood sugar but could not remember what it was. Her A1C is 6.8. Her goal is 7.0 or less. Patient stated that her fasting blood sugars are usually greater than 130. Patient is having chronic pain. Per patient she cannot stand to cook. Her husband tries to cook but has severe back pain. Per patient her husband usually goes out and get fast food every day. Patient stated that the [employer] provides food every Wednesday. Patient has been unable to go to [employer]. She ambulates with a walker. Her gait is unsteady.   Recommendations/Changes made from today's visit: Patient will have a better understanding of foods to pick from fast food service Patient will increase exercise at home and continue physical therapy    Subjective: [name] [name] is an 75 y.o. year old female who is a primary patient of Greig Right, MD. The care management team was consulted for assistance with care management and/or care coordination needs.    RN Health Coach completed Telephone Visit today.   Objective:  Medications Reviewed Today     Reviewed by Verlin Grills, RN (Case Manager) on 10/03/21 at 1526  Med List Status: <None>   Medication Order Taking? Sig Documenting Provider Last Dose Status Informant  acarbose (PRECOSE) 100 MG tablet [id number]  Take 100 mg by mouth 3 (three) times daily with meals. [provider]  Active Self  alendronate (FOSAMAX) 70 MG tablet [id number]  Take 70 mg by mouth once a week.  [provider]  Active Self           Med Note Stevan Born   Fri Jul 05, 2017 11:16 AM)    amLODipine (NORVASC) 5 MG tablet [id number]  Take 5 mg by mouth 2 (two) times daily. [provider]  Active   amoxicillin (AMOXIL) 500 MG capsule [id number]  TAKE 4 CAPSULES 1 HOUR PRIOR TO DENTAL APPOINTMENT.  [provider]  Active   buPROPion (WELLBUTRIN XL) 300 MG 24 hr tablet [id number]  Take 300 mg by mouth daily. [provider]  Active Self  carvedilol (COREG) 6.25 MG tablet [id number]  Take 6.25 mg by mouth in the morning and at bedtime. [provider]  Active   ciprofloxacin (CIPRO) 250 MG tablet [id number]   [provider]  Active   clindamycin (CLEOCIN) 150 MG capsule [id number]   [provider]  Active   doxazosin (CARDURA) 4 MG tablet [id number]  TAKE 1 TABLET BY MOUTH DAILY Bettina Gavia Hilton Cork, MD  Active   furosemide (LASIX) 20 MG tablet [id number]  TAKE 1 TABLET BY MOUTH FOR 2 DAYS THEN TAKE ONLY ON MONDAYS, WEDNESDAYS AND FRIDAYS Richardo Priest, MD  Active   gabapentin (NEURONTIN) 600 MG tablet [id number]  Take 600 mg by mouth 3 (three) times daily.  [provider]  Active Self  HYDROcodone-acetaminophen (NORCO/VICODIN) 5-325 MG tablet [id number]   [provider]  Active   irbesartan (AVAPRO) 300 MG tablet [id number]  Take 300 mg by mouth daily. [provider]  Active   isosorbide mononitrate (IMDUR) 60 MG 24 hr tablet [id number]  Take 60 mg by mouth daily. [provider]  Active Self  levothyroxine (SYNTHROID, LEVOTHROID) 125 MCG tablet [id number]  Take 125 mcg by mouth daily before breakfast.  [provider]  Active Self  Med Note Marcelline Mates, NICOLE H   Fri Jul 05, 2017 11:20 AM)    metFORMIN (GLUCOPHAGE-XR) 500 MG 24 hr tablet [id number] Yes Take 500 mg by mouth 2 (two) times daily.  [provider] Taking Active Self  nabumetone (RELAFEN) 500 MG tablet [id number]  Take 500 mg by mouth in the morning and at bedtime. [provider]  Active   NITROSTAT 0.4 MG SL tablet [id number]  Place 0.4 mg under the tongue every 5 (five) minutes as needed for chest pain.  [provider]  Active Self           Med Note Stevan Born   Fri Jul 05, 2017 11:24 AM)    omeprazole (PRILOSEC)  40 MG capsule [id number]  Take 1 capsule by mouth 2 (two) times daily. [provider]  Active Self  potassium chloride SA (K-DUR,KLOR-CON) 20 MEQ tablet [id number]  Take 40 mEq by mouth 3 (three) times daily.  [provider]  Active Self  rosuvastatin (CRESTOR) 5 MG tablet [id number]  TAKE 1 TABLET BY MOUTH DAILY AT 6 PM. Richardo Priest, MD  Active   TOVIAZ 4 MG TB24 tablet [id number]  Take 1 tablet by mouth daily. [provider]  Active Self  traMADol (ULTRAM) 50 MG tablet [id number]  Take 50 mg by mouth every 6 (six) hours as needed. For pain [provider]  Active Self           Med Note (ROUTH, NICOLE H   Tue Feb 24, 2019 10:45 AM)    venlafaxine XR (EFFEXOR-XR) 150 MG 24 hr capsule [id number]  Take 150 mg by mouth daily with breakfast.  [provider]  Active Self           Med Note Stevan Born   Fri Jul 05, 2017 11:21 AM)    Vitamin D, Ergocalciferol, (DRISDOL) 1.25 MG (50000 UNIT) CAPS capsule [id number]  Take 50,000 Units by mouth once a week. [provider]  Active   Med List Note Annamaria Boots, Kasandra Knudsen, MD 12/24/11 2054): CPAP AutoSet/ Huey Romans Allergy vaccine 1:50 GH             SDOH:  (Social Determinants of Health) assessments and interventions performed:  SDOH Interventions    Flowsheet Row Most Recent Value  SDOH Interventions   Food Insecurity Interventions Intervention Not Indicated  Housing Interventions Intervention Not Indicated  Transportation Interventions Intervention Not Indicated       Care Plan  Review of patient past medical history, allergies, medications, health status, including review of consultants reports, laboratory and other test data, was performed as part of comprehensive evaluation for care management services.   Care Plan : Diabetes Type 2 (Adult)  Updates made by Verlin Grills, RN since 10/03/2021 12:00 AM     Problem: Glycemic Management (Diabetes, Type 2) Resolved 10/03/2021  Note:    [id number]    Goal: Glycemic Management Optimized Completed 10/03/2021  Start Date: 08/04/2021  Expected End Date: 10/18/2021  Recent Progress: On track  Priority: Medium  Note:   Evidence-based guidance:  Anticipate A1C testing (point-of-care) every 3 to 6 months based on goal attainment.  Review mutually-set A1C goal or target range.  Anticipate use of antihyperglycemic with or without insulin and periodic adjustments; consider active involvement of pharmacist.  Provide medical nutrition therapy and development of individualized eating.  Compare self-reported symptoms of hypo or hyperglycemia to blood glucose levels, diet and fluid intake, current medications, psychosocial and physiologic stressors,  change in activity and barriers to care adherence.  Promote self-monitoring of blood glucose levels.  Assess and address barriers to management plan, such as food insecurity, age, developmental ability, depression, anxiety, fear of hypoglycemia or weight gain, as well as medication cost, side effects and complicated regimen.  Consider referral to community-based diabetes education program, visiting nurse, community health worker or health coach.  Encourage regular dental care for treatment of periodontal disease; refer to dental provider when needed.   Notes:     Task: Alleviate Barriers to Glycemic Management Completed 10/03/2021  Due Date: 10/18/2021  Outcome: Positive  Responsible User: Barbaraann Faster, RN  Note:   Care Management Activities:   08/04/21- blood glucose monitoring encouraged - resources required to improve adherence to care identified - self-awareness of signs/symptoms of hypo or hyperglycemia encouraged - use of blood glucose monitoring log promoted    Notes:     Problem: Disease Progression (Diabetes, Type 2) Resolved 10/03/2021  Priority: Medium  Onset Date: 08/04/2021  Note:   [id number] Resolving due to duplicate goal     Long-Range Goal: Disease  Progression Prevented or Minimized Completed 10/03/2021  Start Date: 08/04/2021  Expected End Date: 11/17/2021  Recent Progress: On track  Priority: Medium  Note:   Evidence-based guidance:  Prepare patient for laboratory and diagnostic exams based on risk and presentation.  Encourage lifestyle changes, such as increased intake of plant-based foods, stress reduction, consistent physical activity and smoking cessation to prevent long-term complications and chronic disease.   Individualize activity and exercise recommendations while considering potential limitations, such as neuropathy, retinopathy or the ability to prevent hyperglycemia or hypoglycemia.   Prepare patient for use of pharmacologic therapy that may include antihypertensive, analgesic, prostaglandin E1 with periodic adjustments, based on presenting chronic condition and laboratory results.  Assess signs/symptoms and risk factors for hypertension, sleep-disordered breathing, neuropathy (including changes in gait and balance), retinopathy, nephropathy and sexual dysfunction.  Address pregnancy planning and contraceptive choice, especially when prescribing antihypertensive or statin.  Ensure completion of annual comprehensive foot exam and dilated eye exam.   Implement additional individualized goals and interventions based on identified risk factors.  Prepare patient for consultation or referral for specialist care, such as ophthalmology, neurology, cardiology, podiatry, nephrology or perinatology.   Notes:     Task: Monitor and Manage Follow-up for Comorbidities Completed 10/03/2021  Due Date: 11/17/2021  Outcome: Positive  Responsible User: Barbaraann Faster, RN  Note:   Care Management Activities:   08/04/21- healthy lifestyle promoted - modest weight loss (5 percent) promoted - quality of sleep assessed - reduction of sedentary activity encouraged - signs/symptoms of comorbidities identified - vital signs and trends reviewed     Notes:     Care Plan : RN Care Manage Plan of Care  Updates made by Cam Harnden, Eppie Gibson, RN since 10/03/2021 12:00 AM     Problem: Knowledge Deficit related to Diabetes and Care Coordination needs   Priority: High     Goal: Development Plan od Care for Management of Diabetes   Start Date: 10/03/2021  Expected End Date: 11/17/2022  Priority: High  Note:   Current Barriers:  Knowledge Deficits related to plan of care for management of DMII   RNCM Clinical Goal(s):  Patient will verbalize understanding of plan for management of DMII as evidenced by Continuation of monitoring blood sugars and adhering to diabetic diet  through collaboration with RN Care manager, provider, and care team.   Interventions: Inter-disciplinary care team collaboration (see longitudinal  plan of care) Evaluation of current treatment plan related to  self management and patient's adherence to plan as established by provider RN provided educational material on How to Thrive: A Guide for Your Journey with Diabetes RN provided educational material on Dining out Menu for Diabetes RN provided Educational material on Healthy Eating Plan   Patient Goals/Self-Care Activities: Patient will self administer medications as prescribed as evidenced by self report/primary caregiver report  Patient will attend all scheduled provider appointments as evidenced by clinician review of documented attendance to scheduled appointments and patient/caregiver report Patient will call pharmacy for medication refills as evidenced by patient report and review of pharmacy fill history as appropriate Patient will attend [place] or other social activities as evidenced by patient report Patient will continue to perform ADL's independently as evidenced by patient/caregiver report Patient will continue to perform IADL's independently as evidenced by patient/caregiver report Patient will call provider office for new concerns or questions as  evidenced by review of documented incoming telephone call notes and patient report schedule appointment with eye doctor check blood sugar at prescribed times: 4 times daily and when you have symptoms of low or high blood sugar check feet daily for cuts, sores or redness take the blood sugar log to all doctor visits take the blood sugar meter to all doctor visits trim toenails straight across set a realistic goal        Plan: Telephone follow up appointment with care management team member scheduled for:  December. The patient has been provided with contact information for the care management team and has been advised to call with any health related questions or concerns.  Patient will continue with physical therapy Patient will understand better about what fast foods to eat  [place] Management [phone]

## 2021-10-03 NOTE — Patient Instructions (Signed)
Visit Information  PATIENT GOALS/PLAN OF CARE:  Care Plan : Diabetes Type 2 (Adult)  Updates made by Verlin Grills, RN since 10/03/2021 12:00 AM     Problem: Glycemic Management (Diabetes, Type 2) Resolved 10/03/2021  Note:   [id number]    Goal: Glycemic Management Optimized Completed 10/03/2021  Start Date: 08/04/2021  Expected End Date: 10/18/2021  Recent Progress: On track  Priority: Medium  Note:   Evidence-based guidance:  Anticipate A1C testing (point-of-care) every 3 to 6 months based on goal attainment.  Review mutually-set A1C goal or target range.  Anticipate use of antihyperglycemic with or without insulin and periodic adjustments; consider active involvement of pharmacist.  Provide medical nutrition therapy and development of individualized eating.  Compare self-reported symptoms of hypo or hyperglycemia to blood glucose levels, diet and fluid intake, current medications, psychosocial and physiologic stressors, change in activity and barriers to care adherence.  Promote self-monitoring of blood glucose levels.  Assess and address barriers to management plan, such as food insecurity, age, developmental ability, depression, anxiety, fear of hypoglycemia or weight gain, as well as medication cost, side effects and complicated regimen.  Consider referral to community-based diabetes education program, visiting nurse, community health worker or health coach.  Encourage regular dental care for treatment of periodontal disease; refer to dental provider when needed.   Notes:     Task: Alleviate Barriers to Glycemic Management Completed 10/03/2021  Due Date: 10/18/2021  Outcome: Positive  Responsible User: Barbaraann Faster, RN  Note:   Care Management Activities:   08/04/21- blood glucose monitoring encouraged - resources required to improve adherence to care identified - self-awareness of signs/symptoms of hypo or hyperglycemia encouraged - use of blood glucose  monitoring log promoted    Notes:     Problem: Disease Progression (Diabetes, Type 2) Resolved 10/03/2021  Priority: Medium  Onset Date: 08/04/2021  Note:   [id number] Resolving due to duplicate goal     Long-Range Goal: Disease Progression Prevented or Minimized Completed 10/03/2021  Start Date: 08/04/2021  Expected End Date: 11/17/2021  Recent Progress: On track  Priority: Medium  Note:   Evidence-based guidance:  Prepare patient for laboratory and diagnostic exams based on risk and presentation.  Encourage lifestyle changes, such as increased intake of plant-based foods, stress reduction, consistent physical activity and smoking cessation to prevent long-term complications and chronic disease.   Individualize activity and exercise recommendations while considering potential limitations, such as neuropathy, retinopathy or the ability to prevent hyperglycemia or hypoglycemia.   Prepare patient for use of pharmacologic therapy that may include antihypertensive, analgesic, prostaglandin E1 with periodic adjustments, based on presenting chronic condition and laboratory results.  Assess signs/symptoms and risk factors for hypertension, sleep-disordered breathing, neuropathy (including changes in gait and balance), retinopathy, nephropathy and sexual dysfunction.  Address pregnancy planning and contraceptive choice, especially when prescribing antihypertensive or statin.  Ensure completion of annual comprehensive foot exam and dilated eye exam.   Implement additional individualized goals and interventions based on identified risk factors.  Prepare patient for consultation or referral for specialist care, such as ophthalmology, neurology, cardiology, podiatry, nephrology or perinatology.   Notes:     Task: Monitor and Manage Follow-up for Comorbidities Completed 10/03/2021  Due Date: 11/17/2021  Outcome: Positive  Responsible User: Barbaraann Faster, RN  Note:   Care Management  Activities:   08/04/21- healthy lifestyle promoted - modest weight loss (5 percent) promoted - quality of sleep assessed - reduction of sedentary activity encouraged - signs/symptoms of comorbidities  identified - vital signs and trends reviewed    Notes:     Care Plan : RN Care Manage Plan of Care  Updates made by Shiva Karis, Eppie Gibson, RN since 10/03/2021 12:00 AM     Problem: Knowledge Deficit related to Diabetes and Care Coordination needs   Priority: High     Goal: Development Plan od Care for Management of Diabetes   Start Date: 10/03/2021  Expected End Date: 11/17/2022  Priority: High  Note:   Current Barriers:  Knowledge Deficits related to plan of care for management of DMII   RNCM Clinical Goal(s):  Patient will verbalize understanding of plan for management of DMII as evidenced by Continuation of monitoring blood sugars and adhering to diabetic diet  through collaboration with RN Care manager, provider, and care team.   Interventions: Inter-disciplinary care team collaboration (see longitudinal plan of care) Evaluation of current treatment plan related to  self management and patient's adherence to plan as established by provider RN provided educational material on How to Thrive: A Guide for Your Journey with Diabetes RN provided educational material on Dining out Menu for Diabetes RN provided Educational material on Healthy Eating Plan   Patient Goals/Self-Care Activities: Patient will self administer medications as prescribed as evidenced by self report/primary caregiver report  Patient will attend all scheduled provider appointments as evidenced by clinician review of documented attendance to scheduled appointments and patient/caregiver report Patient will call pharmacy for medication refills as evidenced by patient report and review of pharmacy fill history as appropriate Patient will attend [place] or other social activities as evidenced by patient report Patient  will continue to perform ADL's independently as evidenced by patient/caregiver report Patient will continue to perform IADL's independently as evidenced by patient/caregiver report Patient will call provider office for new concerns or questions as evidenced by review of documented incoming telephone call notes and patient report schedule appointment with eye doctor check blood sugar at prescribed times: 4 times daily and when you have symptoms of low or high blood sugar check feet daily for cuts, sores or redness take the blood sugar log to all doctor visits take the blood sugar meter to all doctor visits trim toenails straight across set a realistic goal       The patient verbalized understanding of instructions, educational materials, and care plan provided today and agreed to receive a mailed copy of patient instructions, educational materials, and care plan.   Telephone follow up appointment with care management team member scheduled for: The patient has been provided with contact information for the care management team and has been advised to call with any health related questions or concerns.   [place] Care Management [phone]

## 2021-10-11 ENCOUNTER — Other Ambulatory Visit: Payer: Self-pay | Admitting: Cardiology

## 2021-10-18 DIAGNOSIS — E1142 Type 2 diabetes mellitus with diabetic polyneuropathy: Secondary | ICD-10-CM | POA: Diagnosis not present

## 2021-10-18 DIAGNOSIS — I10 Essential (primary) hypertension: Secondary | ICD-10-CM | POA: Diagnosis not present

## 2021-10-18 DIAGNOSIS — E7800 Pure hypercholesterolemia, unspecified: Secondary | ICD-10-CM | POA: Diagnosis not present

## 2021-11-03 ENCOUNTER — Other Ambulatory Visit: Payer: Self-pay | Admitting: *Deleted

## 2021-11-03 NOTE — Patient Outreach (Signed)
[facility]) Care Management  11/03/2021  [name] [name] Sep 27, 1946 [id number]  RN Health Coach attempted follow up outreach call to patient.  Patient was unavailable. No voicemail pick up.  Plan: RN will call patient again within 30 days.  [place] Care Management [phone]

## 2021-11-16 DIAGNOSIS — N3281 Overactive bladder: Secondary | ICD-10-CM | POA: Diagnosis not present

## 2021-11-16 DIAGNOSIS — N390 Urinary tract infection, site not specified: Secondary | ICD-10-CM | POA: Diagnosis not present

## 2021-11-21 DIAGNOSIS — K8020 Calculus of gallbladder without cholecystitis without obstruction: Secondary | ICD-10-CM | POA: Diagnosis not present

## 2021-11-21 DIAGNOSIS — K7460 Unspecified cirrhosis of liver: Secondary | ICD-10-CM | POA: Diagnosis not present

## 2021-11-28 DIAGNOSIS — D638 Anemia in other chronic diseases classified elsewhere: Secondary | ICD-10-CM | POA: Diagnosis not present

## 2021-11-28 DIAGNOSIS — K7460 Unspecified cirrhosis of liver: Secondary | ICD-10-CM | POA: Diagnosis not present

## 2021-11-28 DIAGNOSIS — K219 Gastro-esophageal reflux disease without esophagitis: Secondary | ICD-10-CM | POA: Diagnosis not present

## 2021-11-30 ENCOUNTER — Other Ambulatory Visit: Payer: Self-pay | Admitting: *Deleted

## 2021-11-30 NOTE — Patient Instructions (Signed)
Visit Information  Thank you for taking time to visit with me today. Please don't hesitate to contact me if I can be of assistance to you before our next scheduled telephone appointment.  Current Barriers:  Knowledge Deficits related to plan of care for management of DMII   RNCM Clinical Goal(s):  Patient will verbalize understanding of plan for management of DMII as evidenced by Continuation of monitoring blood sugars and adhering to diabetic diet through collaboration with RN Care manager, provider, and care team.   Interventions: Inter-disciplinary care team collaboration (see longitudinal plan of care) Evaluation of current treatment plan related to  self management and patient's adherence to plan as established by provider    Patient Goals/Self-Care Activities: Patient will self administer medications as prescribed as evidenced by self report/primary caregiver report  Patient will attend all scheduled provider appointments as evidenced by clinician review of documented attendance to scheduled appointments and patient/caregiver report Patient will call pharmacy for medication refills as evidenced by patient report and review of pharmacy fill history as appropriate Patient will attend [place] or other social activities as evidenced by patient report Patient will continue to perform ADL's independently as evidenced by patient/caregiver report Patient will continue to perform IADL's independently as evidenced by patient/caregiver report Patient will call provider office for new concerns or questions as evidenced by review of documented incoming telephone call notes and patient report schedule appointment with eye doctor check blood sugar at prescribed times: 4 times daily and when you have symptoms of low or high blood sugar check feet daily for cuts, sores or redness take the blood sugar log to all doctor visits take the blood sugar meter to all doctor visits trim toenails straight  across set a realistic goal Restart Physical therapy Maintain control over UTI  Our next appointment is by telephone on February 28, 2022  Please call the [facility] at [phone] if you need to cancel or reschedule your appointment.   Please call the Suicide and Crisis Lifeline: 988 if you are experiencing a Mental Health or [place] or need someone to talk to.  The patient verbalized understanding of instructions, educational materials, and care plan provided today and agreed to receive a mailed copy of patient instructions, educational materials, and care plan.   Telephone follow up appointment with care management team member scheduled for: The patient has been provided with contact information for the care management team and has been advised to call with any health related questions or concerns.   [place] Care Management [phone]

## 2021-11-30 NOTE — Patient Outreach (Signed)
[place] [facility]) Care Management  11/30/2021  [name] [name] 1945/11/27 [id number]   RN Health Coach attempted follow up outreach call to patient.  Patient was unavailable. No voicemail pickup.  Plan: RN will call patient again within 30 days.  [place] Care Management [phone]

## 2021-12-01 NOTE — Patient Outreach (Signed)
[facility]) Care Management [place] Note   12/01/2021 Name:  [name] [name] MRN:  [id number] DOB:  1945-11-23  Summary: Patient fasting blood sugar 126. A1C is 6.8. Sh has not had any hypoglycemia episodes. Per patient she has not been exercising. Patient has finished physical therapy , but feels that she needs to restart it. Patient has not had any recent falls.   Recommendations/Changes made from today's visit: Patient will follow up with PCP to restart PT Patient will continue to    Subjective: [name] [name] is an 76 y.o. year old female who is a primary patient of Greig Right, MD. The care management team was consulted for assistance with care management and/or care coordination needs.    RN Health Coach completed Telephone Visit today.   Objective:  Medications Reviewed Today     Reviewed by Verlin Grills, RN (Case Manager) on 10/03/21 at 1526  Med List Status: <None>   Medication Order Taking? Sig Documenting Provider Last Dose Status Informant  acarbose (PRECOSE) 100 MG tablet [id number]  Take 100 mg by mouth 3 (three) times daily with meals. [provider]  Active Self  alendronate (FOSAMAX) 70 MG tablet [id number]  Take 70 mg by mouth once a week.  [provider]  Active Self           Med Note Stevan Born   Fri Jul 05, 2017 11:16 AM)    amLODipine (NORVASC) 5 MG tablet [id number]  Take 5 mg by mouth 2 (two) times daily. [provider]  Active   amoxicillin (AMOXIL) 500 MG capsule [id number]  TAKE 4 CAPSULES 1 HOUR PRIOR TO DENTAL APPOINTMENT. [provider]  Active   buPROPion (WELLBUTRIN XL) 300 MG 24 hr tablet [id number]  Take 300 mg by mouth daily. [provider]  Active Self  carvedilol (COREG) 6.25 MG tablet [id number]  Take 6.25 mg by mouth in the morning and at bedtime. [provider]  Active   ciprofloxacin (CIPRO) 250 MG tablet [id number]   [provider]  Active    clindamycin (CLEOCIN) 150 MG capsule [id number]   [provider]  Active   doxazosin (CARDURA) 4 MG tablet [id number]  TAKE 1 TABLET BY MOUTH DAILY Bettina Gavia Hilton Cork, MD  Active   furosemide (LASIX) 20 MG tablet [id number]  TAKE 1 TABLET BY MOUTH FOR 2 DAYS THEN TAKE ONLY ON MONDAYS, WEDNESDAYS AND FRIDAYS Richardo Priest, MD  Active   gabapentin (NEURONTIN) 600 MG tablet [id number]  Take 600 mg by mouth 3 (three) times daily.  [provider]  Active Self  HYDROcodone-acetaminophen (NORCO/VICODIN) 5-325 MG tablet [id number]   [provider]  Active   irbesartan (AVAPRO) 300 MG tablet [id number]  Take 300 mg by mouth daily. [provider]  Active   isosorbide mononitrate (IMDUR) 60 MG 24 hr tablet [id number]  Take 60 mg by mouth daily. [provider]  Active Self  levothyroxine (SYNTHROID, LEVOTHROID) 125 MCG tablet [id number]  Take 125 mcg by mouth daily before breakfast.  [provider]  Active Self           Med Note Marcelline Mates, NICOLE H   Fri Jul 05, 2017 11:20 AM)    metFORMIN (GLUCOPHAGE-XR) 500 MG 24 hr tablet [id number] Yes Take 500 mg by mouth 2 (two) times daily.  [provider] Taking Active Self  nabumetone (RELAFEN) 500 MG tablet [id number]  Take 500 mg by mouth in the morning and at  bedtime. [provider]  Active   NITROSTAT 0.4 MG SL tablet [id number]  Place 0.4 mg under the tongue every 5 (five) minutes as needed for chest pain.  [provider]  Active Self           Med Note Stevan Born   Fri Jul 05, 2017 11:24 AM)    omeprazole (PRILOSEC) 40 MG capsule [id number]  Take 1 capsule by mouth 2 (two) times daily. [provider]  Active Self  potassium chloride SA (K-DUR,KLOR-CON) 20 MEQ tablet [id number]  Take 40 mEq by mouth 3 (three) times daily.  [provider]  Active Self  rosuvastatin (CRESTOR) 5 MG tablet [id number]  TAKE 1 TABLET BY MOUTH DAILY AT 6 PM. Richardo Priest, MD  Active    TOVIAZ 4 MG TB24 tablet [id number]  Take 1 tablet by mouth daily. [provider]  Active Self  traMADol (ULTRAM) 50 MG tablet [id number]  Take 50 mg by mouth every 6 (six) hours as needed. For pain [provider]  Active Self           Med Note (ROUTH, NICOLE H   Tue Feb 24, 2019 10:45 AM)    venlafaxine XR (EFFEXOR-XR) 150 MG 24 hr capsule [id number]  Take 150 mg by mouth daily with breakfast.  [provider]  Active Self           Med Note Stevan Born   Fri Jul 05, 2017 11:21 AM)    Vitamin D, Ergocalciferol, (DRISDOL) 1.25 MG (50000 UNIT) CAPS capsule [id number]  Take 50,000 Units by mouth once a week. [provider]  Active   Med List Note Annamaria Boots, Kasandra Knudsen, MD 12/24/11 2054): CPAP AutoSet/ [facility] Romans Allergy vaccine 1:50 GH             SDOH:  (Social Determinants of Health) assessments and interventions performed:  SDOH Interventions    Flowsheet Row Most Recent Value  SDOH Interventions   Food Insecurity Interventions Intervention Not Indicated  Housing Interventions Intervention Not Indicated  Transportation Interventions Intervention Not Indicated       Care Plan  Review of patient past medical history, allergies, medications, health status, including review of consultants reports, laboratory and other test data, was performed as part of comprehensive evaluation for care management services.   There are no care plans that you recently modified to display for this patient.    Plan: Telephone follow up appointment with care management team member scheduled for:  February 28, 2022 The patient has been provided with contact information for the care management team and has been advised to call with any health related questions or concerns.   [place] Care Management [phone]

## 2021-12-02 DIAGNOSIS — Z20822 Contact with and (suspected) exposure to covid-19: Secondary | ICD-10-CM | POA: Diagnosis not present

## 2021-12-08 DIAGNOSIS — Z23 Encounter for immunization: Secondary | ICD-10-CM | POA: Diagnosis not present

## 2021-12-17 DIAGNOSIS — E7800 Pure hypercholesterolemia, unspecified: Secondary | ICD-10-CM | POA: Diagnosis not present

## 2021-12-17 DIAGNOSIS — E1142 Type 2 diabetes mellitus with diabetic polyneuropathy: Secondary | ICD-10-CM | POA: Diagnosis not present

## 2021-12-17 DIAGNOSIS — I10 Essential (primary) hypertension: Secondary | ICD-10-CM | POA: Diagnosis not present

## 2021-12-25 DIAGNOSIS — E1165 Type 2 diabetes mellitus with hyperglycemia: Secondary | ICD-10-CM | POA: Diagnosis not present

## 2021-12-25 DIAGNOSIS — E039 Hypothyroidism, unspecified: Secondary | ICD-10-CM | POA: Diagnosis not present

## 2021-12-25 DIAGNOSIS — E1142 Type 2 diabetes mellitus with diabetic polyneuropathy: Secondary | ICD-10-CM | POA: Diagnosis not present

## 2021-12-25 DIAGNOSIS — Z7984 Long term (current) use of oral hypoglycemic drugs: Secondary | ICD-10-CM | POA: Diagnosis not present

## 2021-12-25 DIAGNOSIS — I2510 Atherosclerotic heart disease of native coronary artery without angina pectoris: Secondary | ICD-10-CM | POA: Diagnosis not present

## 2022-01-02 DIAGNOSIS — Z20822 Contact with and (suspected) exposure to covid-19: Secondary | ICD-10-CM | POA: Diagnosis not present

## 2022-01-21 NOTE — Progress Notes (Unsigned)
HPI F never smoker, followed for OSA, chronic bronchitis, allergic rhinitis, complicated by nonalcoholic cirrhosis/[name] GI,   A. Fib/"Watchman" clot- preventing device placed in her atrial appendage , HBP, MI, chronic diastolic CHF, GERD, DM 2, hypothyroid NPSG 11/05/01-AHI 93/hour, desaturation to 63%, body weight 217 pounds PFT 07/26/09-mild restriction and reduction of diffusion. No obstruction, no response to dilator -----------------------------------------------------------------------------------  01/23/21- 76 year old female never smoker followed for OSA, chronic bronchitis, allergic rhinitis, complicated by nonalcoholic cirrhosis/[name] GI,  AFib/"Watchman" clot- preventing device placed in her atrial appendage, HBP, MI, chronic diastolic CHF, GERD, DM 2, hypothyroid  CPAP auto 5-15/APS/[facility]    Download- compliance 47%, AHi 5.7/ hr Body weight today-208 lbs Covid vax-   3 Moderna                                    No inhalers, no recent cxr Flu vax-had -----"Wears CPAP-7 hrs. Each night" I reviewed download with her, demonstrating she is not meeting compliance goals. Notes dyspnea when she first lies down. Some dry cough, no wheeze, no acute change. No inhalers.  01/23/22- 76 year old female never smoker followed for OSA, chronic bronchitis, allergic rhinitis, complicated by nonalcoholic cirrhosis/[name] GI,  AFib/"Watchman" clot- preventing device placed in her atrial appendage, HBP, MI, chronic diastolic CHF, GERD, DM 2, hypothyroid  CPAP auto 5-15/APS/[facility]    Download- compliance 27%, AHI 4.4/ hr Body weight today-207 lbs Covid vax-   4 Moderna                                    No inhalers, no recent cxr Flu vax-had   CXR 01/23/21- FINDINGS: Cardiac shadow is enlarged but stable. Lungs are well aerated bilaterally. No focal infiltrate or sizable effusion is seen. No acute bony abnormality is noted. Degenerative changes of the right shoulder are noted.  Degenerative changes of the thoracic spine are seen. IMPRESSION: No acute abnormality noted  ROS-see HPI  + = positive Constitutional:   No-   weight loss, night sweats, fevers, chills, + fatigue, lassitude. HEENT:   No-  headaches, difficulty swallowing, tooth/dental problems, sore throat,       No-  sneezing, itching, ear ache, nasal congestion, post nasal drip,  CV:  No-   chest pain, orthopnea, PND, swelling in lower extremities, anasarca, dizziness, palpitations Resp:+ shortness of breath with exertion or at rest.             productive cough,  + non-productive cough,  No- coughing up of blood.              No-   change in color of mucus.  No- wheezing.   Skin: No-   rash or lesions. GI:  No-   heartburn, indigestion, abdominal pain, nausea, vomiting,  GU:  MS:  + joint pain or swelling. + back pain. Neuro-     nothing unusual Psych:  No- change in mood or affect. No depression or anxiety.  No memory loss.  OBJ- Physical Exam General- Alert, Oriented, Affect-appropriate, Distress- none acute,   +rolling walker +morbid obesity Skin-  + bleeding mole R cheek she associates with CPAP headgear. Lymphadenopathy- none Head- atraumatic            Eyes- Gross vision intact, PERRLA, conjunctivae and secretions clear  Ears- Hearing, canals-normal            Nose- Clear, no-Septal dev, mucus, polyps, erosion, perforation             Throat- Mallampati II-III , mucosa clear , drainage- none, tonsils- atrophic Neck- flexible , trachea midline, no stridor , thyroid nl, carotid no bruit Chest - symmetrical excursion , unlabored           Heart/CV- IRR/Afib , no murmur , no gallop  , no rub, nl s1 s2                           - JVD- none , edema- none, stasis changes +, varices- none           Lung- +fine crackles, wheeze- none, cough- none , dullness-none, rub- none           Chest wall-  Abd-  Br/ Gen/ Rectal- Not done, not indicated Extrem- +braces on ankles Neuro- grossly  intact to observation

## 2022-01-23 ENCOUNTER — Ambulatory Visit (INDEPENDENT_AMBULATORY_CARE_PROVIDER_SITE_OTHER): Payer: Medicare Other | Admitting: Internal Medicine

## 2022-01-23 ENCOUNTER — Encounter: Payer: Self-pay | Admitting: Internal Medicine

## 2022-01-23 ENCOUNTER — Other Ambulatory Visit: Payer: Self-pay

## 2022-01-23 DIAGNOSIS — G4733 Obstructive sleep apnea (adult) (pediatric): Secondary | ICD-10-CM

## 2022-01-23 NOTE — Patient Instructions (Signed)
Try to get to bed earlier, so you are in bed and wearing your CPAP every night. ?I think you would be less sleepy in the daytime. ? ?Please call if we can help ?

## 2022-01-24 ENCOUNTER — Other Ambulatory Visit: Payer: Self-pay | Admitting: Cardiology

## 2022-02-01 DIAGNOSIS — E113212 Type 2 diabetes mellitus with mild nonproliferative diabetic retinopathy with macular edema, left eye: Secondary | ICD-10-CM | POA: Diagnosis not present

## 2022-02-01 DIAGNOSIS — H04123 Dry eye syndrome of bilateral lacrimal glands: Secondary | ICD-10-CM | POA: Diagnosis not present

## 2022-02-01 DIAGNOSIS — E113291 Type 2 diabetes mellitus with mild nonproliferative diabetic retinopathy without macular edema, right eye: Secondary | ICD-10-CM | POA: Diagnosis not present

## 2022-02-02 DIAGNOSIS — Z20822 Contact with and (suspected) exposure to covid-19: Secondary | ICD-10-CM | POA: Diagnosis not present

## 2022-02-17 ENCOUNTER — Encounter: Payer: Self-pay | Admitting: Internal Medicine

## 2022-02-17 NOTE — Assessment & Plan Note (Signed)
Long term problem. Weight fluctuation partly reflects status of her CHF. ?

## 2022-02-17 NOTE — Assessment & Plan Note (Signed)
Inadequate compliance, but benefits from CPAP when used.  ?Plan- work on bedtime habits- get in bed before falling asleep. ?

## 2022-02-21 ENCOUNTER — Other Ambulatory Visit: Payer: Self-pay | Admitting: *Deleted

## 2022-02-21 NOTE — Patient Outreach (Signed)
[facility]) Care Management ? ?02/21/2022 ? ?[name] [name] ?September 19, 1946 ?[id number] ? ? ?RN Health Coach attempted follow up outreach call to patient.  Patient was unavailable. RN Health Coach left message with husband that she had called. ? ?Plan: ?RN will call patient again within 30 days. ? ?Johny Shock BSN RN ?[place] Management ?[phone] ? ?

## 2022-02-28 ENCOUNTER — Other Ambulatory Visit: Payer: Self-pay | Admitting: Cardiology

## 2022-02-28 ENCOUNTER — Ambulatory Visit: Payer: Medicare Other | Admitting: *Deleted

## 2022-03-01 DIAGNOSIS — D638 Anemia in other chronic diseases classified elsewhere: Secondary | ICD-10-CM | POA: Diagnosis not present

## 2022-03-06 DIAGNOSIS — D638 Anemia in other chronic diseases classified elsewhere: Secondary | ICD-10-CM | POA: Diagnosis not present

## 2022-03-14 DIAGNOSIS — L03115 Cellulitis of right lower limb: Secondary | ICD-10-CM | POA: Diagnosis not present

## 2022-03-14 DIAGNOSIS — B372 Candidiasis of skin and nail: Secondary | ICD-10-CM | POA: Diagnosis not present

## 2022-03-14 DIAGNOSIS — N3281 Overactive bladder: Secondary | ICD-10-CM | POA: Diagnosis not present

## 2022-03-14 DIAGNOSIS — N390 Urinary tract infection, site not specified: Secondary | ICD-10-CM | POA: Diagnosis not present

## 2022-03-14 DIAGNOSIS — S80811A Abrasion, right lower leg, initial encounter: Secondary | ICD-10-CM | POA: Diagnosis not present

## 2022-03-19 DIAGNOSIS — M21379 Foot drop, unspecified foot: Secondary | ICD-10-CM | POA: Diagnosis not present

## 2022-03-19 DIAGNOSIS — G4730 Sleep apnea, unspecified: Secondary | ICD-10-CM | POA: Diagnosis not present

## 2022-03-19 DIAGNOSIS — Z86010 Personal history of colonic polyps: Secondary | ICD-10-CM | POA: Diagnosis not present

## 2022-03-19 DIAGNOSIS — E669 Obesity, unspecified: Secondary | ICD-10-CM | POA: Diagnosis not present

## 2022-03-19 DIAGNOSIS — Z7984 Long term (current) use of oral hypoglycemic drugs: Secondary | ICD-10-CM | POA: Diagnosis not present

## 2022-03-19 DIAGNOSIS — M8580 Other specified disorders of bone density and structure, unspecified site: Secondary | ICD-10-CM | POA: Diagnosis not present

## 2022-03-19 DIAGNOSIS — Z9849 Cataract extraction status, unspecified eye: Secondary | ICD-10-CM | POA: Diagnosis not present

## 2022-03-19 DIAGNOSIS — I110 Hypertensive heart disease with heart failure: Secondary | ICD-10-CM | POA: Diagnosis not present

## 2022-03-19 DIAGNOSIS — I4891 Unspecified atrial fibrillation: Secondary | ICD-10-CM | POA: Diagnosis not present

## 2022-03-19 DIAGNOSIS — L03115 Cellulitis of right lower limb: Secondary | ICD-10-CM | POA: Diagnosis not present

## 2022-03-19 DIAGNOSIS — S80811D Abrasion, right lower leg, subsequent encounter: Secondary | ICD-10-CM | POA: Diagnosis not present

## 2022-03-19 DIAGNOSIS — Z4800 Encounter for change or removal of nonsurgical wound dressing: Secondary | ICD-10-CM | POA: Diagnosis not present

## 2022-03-19 DIAGNOSIS — G6289 Other specified polyneuropathies: Secondary | ICD-10-CM | POA: Diagnosis not present

## 2022-03-19 DIAGNOSIS — D696 Thrombocytopenia, unspecified: Secondary | ICD-10-CM | POA: Diagnosis not present

## 2022-03-19 DIAGNOSIS — E785 Hyperlipidemia, unspecified: Secondary | ICD-10-CM | POA: Diagnosis not present

## 2022-03-19 DIAGNOSIS — Z87442 Personal history of urinary calculi: Secondary | ICD-10-CM | POA: Diagnosis not present

## 2022-03-19 DIAGNOSIS — Z87440 Personal history of urinary (tract) infections: Secondary | ICD-10-CM | POA: Diagnosis not present

## 2022-03-19 DIAGNOSIS — R238 Other skin changes: Secondary | ICD-10-CM | POA: Diagnosis not present

## 2022-03-19 DIAGNOSIS — M797 Fibromyalgia: Secondary | ICD-10-CM | POA: Diagnosis not present

## 2022-03-19 DIAGNOSIS — K7469 Other cirrhosis of liver: Secondary | ICD-10-CM | POA: Diagnosis not present

## 2022-03-19 DIAGNOSIS — Z6838 Body mass index (BMI) 38.0-38.9, adult: Secondary | ICD-10-CM | POA: Diagnosis not present

## 2022-03-19 DIAGNOSIS — M1990 Unspecified osteoarthritis, unspecified site: Secondary | ICD-10-CM | POA: Diagnosis not present

## 2022-03-19 DIAGNOSIS — E119 Type 2 diabetes mellitus without complications: Secondary | ICD-10-CM | POA: Diagnosis not present

## 2022-03-19 DIAGNOSIS — I5030 Unspecified diastolic (congestive) heart failure: Secondary | ICD-10-CM | POA: Diagnosis not present

## 2022-03-19 DIAGNOSIS — I2510 Atherosclerotic heart disease of native coronary artery without angina pectoris: Secondary | ICD-10-CM | POA: Diagnosis not present

## 2022-03-22 DIAGNOSIS — Z4800 Encounter for change or removal of nonsurgical wound dressing: Secondary | ICD-10-CM | POA: Diagnosis not present

## 2022-03-22 DIAGNOSIS — R238 Other skin changes: Secondary | ICD-10-CM | POA: Diagnosis not present

## 2022-03-22 DIAGNOSIS — L03115 Cellulitis of right lower limb: Secondary | ICD-10-CM | POA: Diagnosis not present

## 2022-03-22 DIAGNOSIS — I110 Hypertensive heart disease with heart failure: Secondary | ICD-10-CM | POA: Diagnosis not present

## 2022-03-22 DIAGNOSIS — S80811D Abrasion, right lower leg, subsequent encounter: Secondary | ICD-10-CM | POA: Diagnosis not present

## 2022-03-22 DIAGNOSIS — E119 Type 2 diabetes mellitus without complications: Secondary | ICD-10-CM | POA: Diagnosis not present

## 2022-03-26 DIAGNOSIS — L03115 Cellulitis of right lower limb: Secondary | ICD-10-CM | POA: Diagnosis not present

## 2022-03-26 DIAGNOSIS — E119 Type 2 diabetes mellitus without complications: Secondary | ICD-10-CM | POA: Diagnosis not present

## 2022-03-26 DIAGNOSIS — I110 Hypertensive heart disease with heart failure: Secondary | ICD-10-CM | POA: Diagnosis not present

## 2022-03-26 DIAGNOSIS — R238 Other skin changes: Secondary | ICD-10-CM | POA: Diagnosis not present

## 2022-03-26 DIAGNOSIS — Z4800 Encounter for change or removal of nonsurgical wound dressing: Secondary | ICD-10-CM | POA: Diagnosis not present

## 2022-03-26 DIAGNOSIS — S80811D Abrasion, right lower leg, subsequent encounter: Secondary | ICD-10-CM | POA: Diagnosis not present

## 2022-03-28 ENCOUNTER — Other Ambulatory Visit: Payer: Self-pay | Admitting: Cardiology

## 2022-03-29 ENCOUNTER — Other Ambulatory Visit: Payer: Self-pay | Admitting: *Deleted

## 2022-03-29 DIAGNOSIS — F325 Major depressive disorder, single episode, in full remission: Secondary | ICD-10-CM | POA: Diagnosis not present

## 2022-03-29 DIAGNOSIS — G4730 Sleep apnea, unspecified: Secondary | ICD-10-CM | POA: Diagnosis not present

## 2022-03-29 DIAGNOSIS — E1142 Type 2 diabetes mellitus with diabetic polyneuropathy: Secondary | ICD-10-CM | POA: Diagnosis not present

## 2022-03-29 DIAGNOSIS — I4891 Unspecified atrial fibrillation: Secondary | ICD-10-CM | POA: Diagnosis not present

## 2022-03-29 DIAGNOSIS — I10 Essential (primary) hypertension: Secondary | ICD-10-CM | POA: Diagnosis not present

## 2022-03-29 DIAGNOSIS — Z6838 Body mass index (BMI) 38.0-38.9, adult: Secondary | ICD-10-CM | POA: Diagnosis not present

## 2022-03-29 DIAGNOSIS — E7800 Pure hypercholesterolemia, unspecified: Secondary | ICD-10-CM | POA: Diagnosis not present

## 2022-03-29 DIAGNOSIS — E038 Other specified hypothyroidism: Secondary | ICD-10-CM | POA: Diagnosis not present

## 2022-03-29 NOTE — Patient Outreach (Signed)
[facility]) Care Management ? ?03/29/2022 ? ?Venora Kautzman ?04-28-1946 ?[id number] ? ? ?RN Health Coach attempted follow up outreach call to patient.  Patient was unavailable. HIPPA compliance voicemail message left with return callback number. ? ?Plan: ?RN will call patient again within 30 days. ? ?Johny Shock BSN RN ?[place] Management ?[phone] ? ?

## 2022-03-30 DIAGNOSIS — Z4800 Encounter for change or removal of nonsurgical wound dressing: Secondary | ICD-10-CM | POA: Diagnosis not present

## 2022-03-30 DIAGNOSIS — I110 Hypertensive heart disease with heart failure: Secondary | ICD-10-CM | POA: Diagnosis not present

## 2022-03-30 DIAGNOSIS — L03115 Cellulitis of right lower limb: Secondary | ICD-10-CM | POA: Diagnosis not present

## 2022-03-30 DIAGNOSIS — E119 Type 2 diabetes mellitus without complications: Secondary | ICD-10-CM | POA: Diagnosis not present

## 2022-03-30 DIAGNOSIS — S80811D Abrasion, right lower leg, subsequent encounter: Secondary | ICD-10-CM | POA: Diagnosis not present

## 2022-03-30 DIAGNOSIS — R238 Other skin changes: Secondary | ICD-10-CM | POA: Diagnosis not present

## 2022-04-02 DIAGNOSIS — R238 Other skin changes: Secondary | ICD-10-CM | POA: Diagnosis not present

## 2022-04-02 DIAGNOSIS — Z4800 Encounter for change or removal of nonsurgical wound dressing: Secondary | ICD-10-CM | POA: Diagnosis not present

## 2022-04-02 DIAGNOSIS — S80811D Abrasion, right lower leg, subsequent encounter: Secondary | ICD-10-CM | POA: Diagnosis not present

## 2022-04-02 DIAGNOSIS — L03115 Cellulitis of right lower limb: Secondary | ICD-10-CM | POA: Diagnosis not present

## 2022-04-02 DIAGNOSIS — E119 Type 2 diabetes mellitus without complications: Secondary | ICD-10-CM | POA: Diagnosis not present

## 2022-04-02 DIAGNOSIS — I110 Hypertensive heart disease with heart failure: Secondary | ICD-10-CM | POA: Diagnosis not present

## 2022-04-12 DIAGNOSIS — L03115 Cellulitis of right lower limb: Secondary | ICD-10-CM | POA: Diagnosis not present

## 2022-04-12 DIAGNOSIS — E119 Type 2 diabetes mellitus without complications: Secondary | ICD-10-CM | POA: Diagnosis not present

## 2022-04-12 DIAGNOSIS — R238 Other skin changes: Secondary | ICD-10-CM | POA: Diagnosis not present

## 2022-04-12 DIAGNOSIS — S80811D Abrasion, right lower leg, subsequent encounter: Secondary | ICD-10-CM | POA: Diagnosis not present

## 2022-04-12 DIAGNOSIS — I110 Hypertensive heart disease with heart failure: Secondary | ICD-10-CM | POA: Diagnosis not present

## 2022-04-12 DIAGNOSIS — Z4800 Encounter for change or removal of nonsurgical wound dressing: Secondary | ICD-10-CM | POA: Diagnosis not present

## 2022-04-17 DIAGNOSIS — Z4800 Encounter for change or removal of nonsurgical wound dressing: Secondary | ICD-10-CM | POA: Diagnosis not present

## 2022-04-17 DIAGNOSIS — I110 Hypertensive heart disease with heart failure: Secondary | ICD-10-CM | POA: Diagnosis not present

## 2022-04-17 DIAGNOSIS — L03115 Cellulitis of right lower limb: Secondary | ICD-10-CM | POA: Diagnosis not present

## 2022-04-17 DIAGNOSIS — S80811D Abrasion, right lower leg, subsequent encounter: Secondary | ICD-10-CM | POA: Diagnosis not present

## 2022-04-17 DIAGNOSIS — E119 Type 2 diabetes mellitus without complications: Secondary | ICD-10-CM | POA: Diagnosis not present

## 2022-04-17 DIAGNOSIS — R238 Other skin changes: Secondary | ICD-10-CM | POA: Diagnosis not present

## 2022-04-18 DIAGNOSIS — E119 Type 2 diabetes mellitus without complications: Secondary | ICD-10-CM | POA: Diagnosis not present

## 2022-04-18 DIAGNOSIS — G4730 Sleep apnea, unspecified: Secondary | ICD-10-CM | POA: Diagnosis not present

## 2022-04-18 DIAGNOSIS — S80811D Abrasion, right lower leg, subsequent encounter: Secondary | ICD-10-CM | POA: Diagnosis not present

## 2022-04-18 DIAGNOSIS — K7469 Other cirrhosis of liver: Secondary | ICD-10-CM | POA: Diagnosis not present

## 2022-04-18 DIAGNOSIS — Z7984 Long term (current) use of oral hypoglycemic drugs: Secondary | ICD-10-CM | POA: Diagnosis not present

## 2022-04-18 DIAGNOSIS — Z87440 Personal history of urinary (tract) infections: Secondary | ICD-10-CM | POA: Diagnosis not present

## 2022-04-18 DIAGNOSIS — D696 Thrombocytopenia, unspecified: Secondary | ICD-10-CM | POA: Diagnosis not present

## 2022-04-18 DIAGNOSIS — I5030 Unspecified diastolic (congestive) heart failure: Secondary | ICD-10-CM | POA: Diagnosis not present

## 2022-04-18 DIAGNOSIS — M8580 Other specified disorders of bone density and structure, unspecified site: Secondary | ICD-10-CM | POA: Diagnosis not present

## 2022-04-18 DIAGNOSIS — I110 Hypertensive heart disease with heart failure: Secondary | ICD-10-CM | POA: Diagnosis not present

## 2022-04-18 DIAGNOSIS — Z4800 Encounter for change or removal of nonsurgical wound dressing: Secondary | ICD-10-CM | POA: Diagnosis not present

## 2022-04-18 DIAGNOSIS — I2510 Atherosclerotic heart disease of native coronary artery without angina pectoris: Secondary | ICD-10-CM | POA: Diagnosis not present

## 2022-04-18 DIAGNOSIS — Z6838 Body mass index (BMI) 38.0-38.9, adult: Secondary | ICD-10-CM | POA: Diagnosis not present

## 2022-04-18 DIAGNOSIS — M21379 Foot drop, unspecified foot: Secondary | ICD-10-CM | POA: Diagnosis not present

## 2022-04-18 DIAGNOSIS — G6289 Other specified polyneuropathies: Secondary | ICD-10-CM | POA: Diagnosis not present

## 2022-04-18 DIAGNOSIS — L03115 Cellulitis of right lower limb: Secondary | ICD-10-CM | POA: Diagnosis not present

## 2022-04-18 DIAGNOSIS — R238 Other skin changes: Secondary | ICD-10-CM | POA: Diagnosis not present

## 2022-04-18 DIAGNOSIS — Z86010 Personal history of colonic polyps: Secondary | ICD-10-CM | POA: Diagnosis not present

## 2022-04-18 DIAGNOSIS — E669 Obesity, unspecified: Secondary | ICD-10-CM | POA: Diagnosis not present

## 2022-04-18 DIAGNOSIS — Z9849 Cataract extraction status, unspecified eye: Secondary | ICD-10-CM | POA: Diagnosis not present

## 2022-04-18 DIAGNOSIS — I4891 Unspecified atrial fibrillation: Secondary | ICD-10-CM | POA: Diagnosis not present

## 2022-04-18 DIAGNOSIS — M797 Fibromyalgia: Secondary | ICD-10-CM | POA: Diagnosis not present

## 2022-04-18 DIAGNOSIS — E785 Hyperlipidemia, unspecified: Secondary | ICD-10-CM | POA: Diagnosis not present

## 2022-04-18 DIAGNOSIS — M1990 Unspecified osteoarthritis, unspecified site: Secondary | ICD-10-CM | POA: Diagnosis not present

## 2022-04-18 DIAGNOSIS — Z87442 Personal history of urinary calculi: Secondary | ICD-10-CM | POA: Diagnosis not present

## 2022-04-19 DIAGNOSIS — S80811D Abrasion, right lower leg, subsequent encounter: Secondary | ICD-10-CM | POA: Diagnosis not present

## 2022-04-19 DIAGNOSIS — I110 Hypertensive heart disease with heart failure: Secondary | ICD-10-CM | POA: Diagnosis not present

## 2022-04-19 DIAGNOSIS — R238 Other skin changes: Secondary | ICD-10-CM | POA: Diagnosis not present

## 2022-04-19 DIAGNOSIS — Z4800 Encounter for change or removal of nonsurgical wound dressing: Secondary | ICD-10-CM | POA: Diagnosis not present

## 2022-04-19 DIAGNOSIS — E119 Type 2 diabetes mellitus without complications: Secondary | ICD-10-CM | POA: Diagnosis not present

## 2022-04-19 DIAGNOSIS — L03115 Cellulitis of right lower limb: Secondary | ICD-10-CM | POA: Diagnosis not present

## 2022-04-22 DIAGNOSIS — I10 Essential (primary) hypertension: Secondary | ICD-10-CM | POA: Diagnosis not present

## 2022-04-22 DIAGNOSIS — R509 Fever, unspecified: Secondary | ICD-10-CM | POA: Diagnosis not present

## 2022-04-22 DIAGNOSIS — R0602 Shortness of breath: Secondary | ICD-10-CM | POA: Diagnosis not present

## 2022-04-22 DIAGNOSIS — Z7401 Bed confinement status: Secondary | ICD-10-CM | POA: Diagnosis not present

## 2022-04-22 DIAGNOSIS — R410 Disorientation, unspecified: Secondary | ICD-10-CM | POA: Diagnosis not present

## 2022-04-22 DIAGNOSIS — I509 Heart failure, unspecified: Secondary | ICD-10-CM | POA: Diagnosis not present

## 2022-04-22 DIAGNOSIS — I4891 Unspecified atrial fibrillation: Secondary | ICD-10-CM | POA: Diagnosis not present

## 2022-04-22 DIAGNOSIS — I110 Hypertensive heart disease with heart failure: Secondary | ICD-10-CM | POA: Diagnosis not present

## 2022-04-22 DIAGNOSIS — R519 Headache, unspecified: Secondary | ICD-10-CM | POA: Diagnosis not present

## 2022-04-22 DIAGNOSIS — U071 COVID-19: Secondary | ICD-10-CM | POA: Diagnosis not present

## 2022-04-25 DIAGNOSIS — L03115 Cellulitis of right lower limb: Secondary | ICD-10-CM | POA: Diagnosis not present

## 2022-04-25 DIAGNOSIS — Z4800 Encounter for change or removal of nonsurgical wound dressing: Secondary | ICD-10-CM | POA: Diagnosis not present

## 2022-04-25 DIAGNOSIS — E119 Type 2 diabetes mellitus without complications: Secondary | ICD-10-CM | POA: Diagnosis not present

## 2022-04-25 DIAGNOSIS — S80811D Abrasion, right lower leg, subsequent encounter: Secondary | ICD-10-CM | POA: Diagnosis not present

## 2022-04-25 DIAGNOSIS — I110 Hypertensive heart disease with heart failure: Secondary | ICD-10-CM | POA: Diagnosis not present

## 2022-04-25 DIAGNOSIS — R238 Other skin changes: Secondary | ICD-10-CM | POA: Diagnosis not present

## 2022-04-25 NOTE — Patient Outreach (Signed)
[facility]) Care Management  04/25/2022  [name] [name] [birth year] [id number]   RN Health Coach attempted follow up outreach call to patient.  Patient was unavailable. HIPPA compliance voicemail message left with return callback number.  Plan: RN will call patient again within 30 days.  [place] Care Management [phone]

## 2022-04-26 ENCOUNTER — Other Ambulatory Visit: Payer: Self-pay | Admitting: *Deleted

## 2022-04-30 DIAGNOSIS — R238 Other skin changes: Secondary | ICD-10-CM | POA: Diagnosis not present

## 2022-04-30 DIAGNOSIS — Z4800 Encounter for change or removal of nonsurgical wound dressing: Secondary | ICD-10-CM | POA: Diagnosis not present

## 2022-04-30 DIAGNOSIS — I110 Hypertensive heart disease with heart failure: Secondary | ICD-10-CM | POA: Diagnosis not present

## 2022-04-30 DIAGNOSIS — L03115 Cellulitis of right lower limb: Secondary | ICD-10-CM | POA: Diagnosis not present

## 2022-04-30 DIAGNOSIS — S80811D Abrasion, right lower leg, subsequent encounter: Secondary | ICD-10-CM | POA: Diagnosis not present

## 2022-04-30 DIAGNOSIS — E119 Type 2 diabetes mellitus without complications: Secondary | ICD-10-CM | POA: Diagnosis not present

## 2022-05-25 DIAGNOSIS — K7460 Unspecified cirrhosis of liver: Secondary | ICD-10-CM | POA: Diagnosis not present

## 2022-05-25 DIAGNOSIS — D638 Anemia in other chronic diseases classified elsewhere: Secondary | ICD-10-CM | POA: Diagnosis not present

## 2022-05-29 DIAGNOSIS — K219 Gastro-esophageal reflux disease without esophagitis: Secondary | ICD-10-CM | POA: Diagnosis not present

## 2022-05-29 DIAGNOSIS — D638 Anemia in other chronic diseases classified elsewhere: Secondary | ICD-10-CM | POA: Diagnosis not present

## 2022-05-29 DIAGNOSIS — K7460 Unspecified cirrhosis of liver: Secondary | ICD-10-CM | POA: Diagnosis not present

## 2022-06-01 ENCOUNTER — Ambulatory Visit (INDEPENDENT_AMBULATORY_CARE_PROVIDER_SITE_OTHER): Payer: Medicare Other | Admitting: Cardiology

## 2022-06-01 ENCOUNTER — Encounter: Payer: Self-pay | Admitting: Cardiology

## 2022-06-01 VITALS — BP 124/56 | HR 62 | Ht 63.0 in | Wt 200.4 lb

## 2022-06-01 DIAGNOSIS — R079 Chest pain, unspecified: Secondary | ICD-10-CM

## 2022-06-01 DIAGNOSIS — K7460 Unspecified cirrhosis of liver: Secondary | ICD-10-CM

## 2022-06-01 DIAGNOSIS — I2510 Atherosclerotic heart disease of native coronary artery without angina pectoris: Secondary | ICD-10-CM | POA: Diagnosis not present

## 2022-06-01 DIAGNOSIS — I110 Hypertensive heart disease with heart failure: Secondary | ICD-10-CM

## 2022-06-01 DIAGNOSIS — I5032 Chronic diastolic (congestive) heart failure: Secondary | ICD-10-CM | POA: Diagnosis not present

## 2022-06-01 DIAGNOSIS — Z1231 Encounter for screening mammogram for malignant neoplasm of breast: Secondary | ICD-10-CM | POA: Diagnosis not present

## 2022-06-01 DIAGNOSIS — I482 Chronic atrial fibrillation, unspecified: Secondary | ICD-10-CM

## 2022-06-01 DIAGNOSIS — Z95818 Presence of other cardiac implants and grafts: Secondary | ICD-10-CM | POA: Diagnosis not present

## 2022-06-01 NOTE — Patient Instructions (Signed)

## 2022-06-01 NOTE — Progress Notes (Unsigned)
Cardiology Office Note:    Date:  06/01/2022   ID:  [name] [name], DOB 11/06/1946, MRN [id number]  PCP:  Greig Right, MD  Cardiologist:  Shirlee More, MD    Referring MD: Greig Right, MD    ASSESSMENT:    1. Chest pain of uncertain etiology   2. Chronic atrial fibrillation (HCC)   3. Presence of Watchman left atrial appendage closure device   4. Mild CAD   5. Hypertensive heart disease with chronic diastolic congestive heart failure ([place])   6. Idiopathic cirrhosis (HCC)    PLAN:    In order of problems listed above:  Her chest pain is nonanginal in nature in the context of trauma and most consistent with costochondral pain.  She is reassured.  Symptoms are not severe and I think with her liver disease and thrombocytopenia that would best to avoid treatment like a nonsteroidal anti-inflammatory drug.  She agrees. Stable she had Watchman device does not require anticoagulation and has good rate control on low-dose beta-blocker and continue carvedilol Stable mild CAD not having typical angina continue current medical treatment including oral nitrate beta-blocker BP at target continue current treatment including her ARB calcium channel blocker and loop diuretic with previous hyperkalemia she takes a lower dose of potassium supplement.  She has no fluid overload she will continue her current diuretic which is managed the problem. Followed by GI.   Next appointment: 6 months   Medication Adjustments/Labs and Tests Ordered: Current medicines are reviewed at length with the patient today.  Concerns regarding medicines are outlined above.  No orders of the defined types were placed in this encounter.  No orders of the defined types were placed in this encounter.   Chief Complaint  Patient presents with   Chest Pain    Ongoing for 6 weeks     History of Present Illness:    [name] [name] is a 76 y.o. female with a hx of chronic atrial fibrillation with watchman device left  atrial appendage closure because of high risk for anticoagulation with cirrhosis mild CAD hyperlipidemia and hypertensive heart disease with chronic diastolic heart failure with  ejection fraction of 55 to 60% severe left atrial enlargement mild right atrial enlargement and mild mitral and tricuspid regurgitation. She was last seen 08/03/2021.  Compliance with diet, lifestyle and medications: Yes  [name] [name] was seen by me in the office in routine follow-up but her concern is chest pain She continues to have intermittent episodes over the last 6 weeks of sharp localized chest pain momentarily not with activity not anginal in nature Onset occurred after a fall where she struck her chest Initially is much more severe and she is even short of breath with a deep inspiration No fever chills cough or wheezing No palpitation or syncope Overall she feels improved Past Medical History:  Diagnosis Date   Allergic rhinitis    Anxiety    ANXIETY 12/26/2007   Qualifier: Diagnosis of  By: Ronnald Ramp CNA/MA, Jessica     Arthritis    Bronchitis    Chest pain in adult 12/24/2018   CHF (congestive heart failure) ([place])    CHF, MILD 12/26/2007   Qualifier: Diagnosis of  By: Ronnald Ramp CNA/MA, Jessica     Chronic atrial fibrillation ([place]) 04/24/2016   Watchman atrial appendage device [facility] for clot pevention   Chronic diastolic heart failure ([place])    Chronic liver disease 03/15/2016   Closed fracture of proximal tibia 9/38/1829   Complication of anesthesia  low blood pressure once   Depression    Enlarged heart    Esophageal reflux 07/04/2017   Essential hypertension 12/26/2007   Qualifier: Diagnosis of  By: Ronnald Ramp CNA/MA, Janett Billow     Fibromyalgia    GERD (gastroesophageal reflux disease)    HTN (hypertension)    on medication since age 52   Hypercholesteremia    Hyperlipidemia 02/29/2016   Hypertensive heart disease    on medication since age 89   Hypothyroidism 03/20/2016   Idiopathic cirrhosis ([place])    stage  4; sees [name] in [place]   Mild CAD 02/29/2016   Myocardial infarction [facility])    age 52   Neuropathy, peripheral    lower extremities   OSA (obstructive sleep apnea)    OSA on CPAP 07/04/2008   CPAP AutoSet/ [facility]    Presence of Watchman left atrial appendage closure device 01/30/2018   Recurrent UTI 08/04/2021   Seasonal and perennial allergic rhinitis 01/23/2011   Allergy vaccine restarted at 1:50 08/02/2011 [place], [place] 2016    Simple chronic bronchitis ([place]) 12/26/2007   PFT 07/26/09-mild restriction and reduction of diffusion. No obstruction, no response to dilator    Sleep apnea    Somnolence    SOMNOLENCE 12/26/2007   Annotation: excessive daytime Qualifier: Diagnosis of  By: Ronnald Ramp CNA/MA, Jessica     Splenomegaly    Swelling of joint, knee, right 01/30/2018   Type 2 diabetes mellitus ([place]) 12/29/2007   Qualifier: Diagnosis of  By: Annamaria Boots MD, [place] D    Type 2 diabetes mellitus without complications ([place])    Type II or unspecified type diabetes mellitus without mention of complication, not stated as uncontrolled    Urinary incontinence, nocturnal enuresis    Urinary, incontinence, stress female    wears depends    Past Surgical History:  Procedure Laterality Date   ABDOMINAL HYSTERECTOMY     APPENDECTOMY     BACK SURGERY     CARPAL TUNNEL RELEASE     bilaterally   CATARACT EXTRACTION W/ INTRAOCULAR LENS  IMPLANT, BILATERAL     DILATION AND CURETTAGE OF UTERUS     EYE SURGERY     cataract ext/ iol implants   KNEE ARTHROSCOPY     LUMBAR LAMINECTOMY  03/2011; 01/2012   MOUTH SURGERY     SPLENECTOMY     TONSILLECTOMY AND ADENOIDECTOMY     TOTAL ABDOMINAL HYSTERECTOMY      Current Medications: Current Meds  Medication Sig   alendronate (FOSAMAX) 70 MG tablet Take 70 mg by mouth once a week.    amLODipine (NORVASC) 5 MG tablet Take 5 mg by mouth 2 (two) times daily.   amoxicillin (AMOXIL) 500 MG capsule Take 500 mg by mouth as needed (dental procedures).   buPROPion  (WELLBUTRIN XL) 300 MG 24 hr tablet Take 300 mg by mouth daily.   carvedilol (COREG) 6.25 MG tablet Take 6.25 mg by mouth in the morning and at bedtime.   doxazosin (CARDURA) 4 MG tablet TAKE 1 TABLET BY MOUTH DAILY (Patient taking differently: Take 4 mg by mouth daily.)   furosemide (LASIX) 20 MG tablet TAKE 1 TABLET BY MOUTH FOR 2 DAYS THEN TAKE ONLY ON MONDAYS, WEDNESDAYS AND FRIDAYS (Patient taking differently: Take 20 mg by mouth See admin instructions. TAKE 1 TABLET BY MOUTH FOR 2 DAYS THEN TAKE ONLY ON MONDAYS, WEDNESDAYS AND FRIDAYS)   gabapentin (NEURONTIN) 600 MG tablet Take 600 mg by mouth 3 (three) times daily.    irbesartan (  AVAPRO) 300 MG tablet Take 300 mg by mouth daily.   isosorbide mononitrate (IMDUR) 60 MG 24 hr tablet Take 60 mg by mouth daily.   levothyroxine (SYNTHROID, LEVOTHROID) 125 MCG tablet Take 125 mcg by mouth daily before breakfast.    metFORMIN (GLUCOPHAGE-XR) 500 MG 24 hr tablet Take 500 mg by mouth 2 (two) times daily.    nabumetone (RELAFEN) 500 MG tablet Take 500 mg by mouth in the morning and at bedtime.   NITROSTAT 0.4 MG SL tablet Place 0.4 mg under the tongue every 5 (five) minutes as needed for chest pain.    omeprazole (PRILOSEC) 40 MG capsule Take 1 capsule by mouth 2 (two) times daily.   potassium chloride SA (K-DUR,KLOR-CON) 20 MEQ tablet Take 40 mEq by mouth 3 (three) times daily.    rosuvastatin (CRESTOR) 5 MG tablet Take 1 tablet (5 mg total) by mouth daily.   TOVIAZ 4 MG TB24 tablet Take 1 tablet by mouth daily.   traMADol (ULTRAM) 50 MG tablet Take 50 mg by mouth every 6 (six) hours as needed for moderate pain or severe pain. For pain   venlafaxine XR (EFFEXOR-XR) 150 MG 24 hr capsule Take 150 mg by mouth daily with breakfast.    Vitamin D, Ergocalciferol, (DRISDOL) 1.25 MG (50000 UNIT) CAPS capsule Take 50,000 Units by mouth once a week.     Allergies:   Azithromycin, Cefuroxime axetil, Celecoxib, Codeine, Sulfa antibiotics, Amlodipine,  Canagliflozin, Cefuroxime, Exenatide, Hydralazine hcl, Hydralazine hcl, Other, and Norvasc [amlodipine besylate]   Social History   Socioeconomic History   Marital status: Married    Spouse name: Richard   Number of children: Not on file   Years of education: Not on file   Highest education level: Not on file  Occupational History   Not on file  Tobacco Use   Smoking status: Never   Smokeless tobacco: Never  Vaping Use   Vaping Use: Never used  Substance and Sexual Activity   Alcohol use: No   Drug use: No   Sexual activity: Not Currently  Other Topics Concern   Not on file  Social History Narrative   Husband has neurological  issues seen by 4 neuro no dx essential tremors r/o parkinson   Her Labat assists with food    Social Determinants of Health   Financial Resource Strain: Low Risk  (08/04/2021)   Overall Financial Resource Strain (CARDIA)    Difficulty of Paying Living Expenses: Not hard at all  Food Insecurity: No Food Insecurity (11/30/2021)   Hunger Vital Sign    Worried About Running Out of Food in the Last Year: Never true    [place] in the Last Year: Never true  Transportation Needs: No Transportation Needs (11/30/2021)   PRAPARE - Hydrologist (Medical): No    Lack of Transportation (Non-Medical): No  Physical Activity: Not on file  Stress: No Stress Concern Present (08/04/2021)   [place]    Feeling of Stress : Only a little  Social Connections: Socially Integrated (08/04/2021)   Social Connection and Isolation Panel [NHANES]    Frequency of Communication with Friends and Family: Three times a week    Frequency of Social Gatherings with Friends and Family: Three times a week    Attends Religious Services: 1 to 4 times per year    Active Member of Clubs or Organizations: Yes    Attends Archivist  Meetings: 1 to 4 times per year    Marital Status:  Married     Family History: The patient's family history includes Anesthesia problems in her mother; CAD in her father; Cancer in her mother; Diabetes in her paternal grandmother; Emphysema in her mother; Heart attack in her father; Hypertension in her father; Rheum arthritis in her mother; Stroke in her father. ROS:   Please see the history of present illness.    All other systems reviewed and are negative.  EKGs/Labs/Other Studies Reviewed:    The following studies were reviewed today:  EKG:  EKG ordered today and personally reviewed.  The ekg ordered today demonstrates atrial fibrillation controlled ventricular rate low voltage EKG consider anterior septal lateral MI 40 June 2022  Recent Labs: 03/29/2022 cholesterol 108 LDL 46 A1c 7.0 hemoglobin 11.4 creatinine 0.97 potassium 5.7 her potassium supplement was decreased 50%   Physical Exam:    VS:  BP (!) 124/56 (BP Location: Left Arm, Patient Position: Sitting)   Pulse 62   Ht '5\' 3"'$  (1.6 m)   Wt 200 lb 6.4 oz (90.9 kg)   SpO2 94%   BMI 35.50 kg/m     Wt Readings from Last 3 Encounters:  06/01/22 200 lb 6.4 oz (90.9 kg)  01/23/22 207 lb 6.4 oz (94.1 kg)  08/03/21 211 lb 12.8 oz (96.1 kg)     GEN: She looks improved well nourished, well developed in no acute distress HEENT: Normal NECK: No JVD; No carotid bruits LYMPHATICS: No lymphadenopathy CARDIAC: She has chest wall tenderness along the left and right costochondral junction which reproduces her complaints RRR, no murmurs, rubs, gallops RESPIRATORY:  Clear to auscultation without rales, wheezing or rhonchi  ABDOMEN: Soft, non-tender, non-distended MUSCULOSKELETAL:  No edema; No deformity  SKIN: Warm and dry NEUROLOGIC:  Alert and oriented x 3 PSYCHIATRIC:  Normal affect    Signed, Shirlee More, MD  06/01/2022 4:48 PM    [facility]

## 2022-06-04 ENCOUNTER — Other Ambulatory Visit: Payer: Self-pay | Admitting: *Deleted

## 2022-06-04 NOTE — Patient Outreach (Signed)
[facility]) Care Management  06/04/2022  Madgie Dhaliwal 1946/04/25 [id number]   RN Health Coach attempted follow up outreach call to patient.  Patient was unavailable. HIPPA compliance voicemail message left with return callback number.  Plan: RN will call patient again within 30 days.  [place] Care Management [phone]

## 2022-06-07 ENCOUNTER — Ambulatory Visit: Payer: Self-pay | Admitting: *Deleted

## 2022-06-07 NOTE — Patient Outreach (Signed)
[facility]) Care Management  06/07/2022  [name] [name] September 04, 1946 [id number]  RN Health Coach case closure. Unable to contact  [place] Management [phone]

## 2022-06-26 DIAGNOSIS — E1142 Type 2 diabetes mellitus with diabetic polyneuropathy: Secondary | ICD-10-CM | POA: Diagnosis not present

## 2022-06-26 DIAGNOSIS — E669 Obesity, unspecified: Secondary | ICD-10-CM | POA: Diagnosis not present

## 2022-06-26 DIAGNOSIS — E039 Hypothyroidism, unspecified: Secondary | ICD-10-CM | POA: Diagnosis not present

## 2022-06-27 DIAGNOSIS — I482 Chronic atrial fibrillation, unspecified: Secondary | ICD-10-CM | POA: Diagnosis not present

## 2022-06-27 DIAGNOSIS — R3915 Urgency of urination: Secondary | ICD-10-CM | POA: Diagnosis not present

## 2022-06-27 DIAGNOSIS — Z0000 Encounter for general adult medical examination without abnormal findings: Secondary | ICD-10-CM | POA: Diagnosis not present

## 2022-06-27 DIAGNOSIS — I700 Atherosclerosis of aorta: Secondary | ICD-10-CM | POA: Diagnosis not present

## 2022-06-27 DIAGNOSIS — D696 Thrombocytopenia, unspecified: Secondary | ICD-10-CM | POA: Diagnosis not present

## 2022-06-27 DIAGNOSIS — F3341 Major depressive disorder, recurrent, in partial remission: Secondary | ICD-10-CM | POA: Diagnosis not present

## 2022-06-27 DIAGNOSIS — Z6837 Body mass index (BMI) 37.0-37.9, adult: Secondary | ICD-10-CM | POA: Diagnosis not present

## 2022-06-27 DIAGNOSIS — E1142 Type 2 diabetes mellitus with diabetic polyneuropathy: Secondary | ICD-10-CM | POA: Diagnosis not present

## 2022-06-27 DIAGNOSIS — E669 Obesity, unspecified: Secondary | ICD-10-CM | POA: Diagnosis not present

## 2022-06-27 DIAGNOSIS — E113293 Type 2 diabetes mellitus with mild nonproliferative diabetic retinopathy without macular edema, bilateral: Secondary | ICD-10-CM | POA: Diagnosis not present

## 2022-06-27 DIAGNOSIS — I10 Essential (primary) hypertension: Secondary | ICD-10-CM | POA: Diagnosis not present

## 2022-07-12 DIAGNOSIS — D485 Neoplasm of uncertain behavior of skin: Secondary | ICD-10-CM | POA: Diagnosis not present

## 2022-07-17 DIAGNOSIS — N390 Urinary tract infection, site not specified: Secondary | ICD-10-CM | POA: Diagnosis not present

## 2022-07-17 DIAGNOSIS — B372 Candidiasis of skin and nail: Secondary | ICD-10-CM | POA: Diagnosis not present

## 2022-07-17 DIAGNOSIS — N3281 Overactive bladder: Secondary | ICD-10-CM | POA: Diagnosis not present

## 2022-07-18 DIAGNOSIS — M47814 Spondylosis without myelopathy or radiculopathy, thoracic region: Secondary | ICD-10-CM | POA: Diagnosis not present

## 2022-07-18 DIAGNOSIS — R928 Other abnormal and inconclusive findings on diagnostic imaging of breast: Secondary | ICD-10-CM | POA: Diagnosis not present

## 2022-07-18 DIAGNOSIS — R921 Mammographic calcification found on diagnostic imaging of breast: Secondary | ICD-10-CM | POA: Diagnosis not present

## 2022-07-24 DIAGNOSIS — N390 Urinary tract infection, site not specified: Secondary | ICD-10-CM | POA: Diagnosis not present

## 2022-07-25 ENCOUNTER — Other Ambulatory Visit: Payer: Self-pay | Admitting: Cardiology

## 2022-07-25 NOTE — Progress Notes (Deleted)
HPI F never smoker, followed for OSA, chronic bronchitis, allergic rhinitis, complicated by nonalcoholic cirrhosis/[name] GI,   A. Fib/"Watchman" clot- preventing device placed in her atrial appendage , HBP, MI, chronic diastolic CHF, GERD, DM 2, hypothyroid NPSG 11/05/01-AHI 93/hour, desaturation to 63%, body weight 217 pounds PFT 07/26/09-mild restriction and reduction of diffusion. No obstruction, no response to dilator -----------------------------------------------------------------------------------   01/23/22- 76 year old female never smoker followed for OSA, chronic bronchitis, allergic rhinitis, complicated by nonalcoholic cirrhosis/[name] GI,  AFib/"Watchman" clot- preventing device placed in her atrial appendage, HBP, MI, chronic diastolic CHF, GERD, DM 2, hypothyroid  CPAP auto 5-15/APS/[facility]    Download- compliance 27%, AHI 4.4/ hr Body weight today-207 lbs Covid vax-   4 Moderna                                    No inhalers, no recent cxr Flu vax-had Says she falls asleep in chair and husband won't wake her to get to bed and put CPAP on.  Breathing ok with no recent infection.  CXR 01/23/21- FINDINGS: Cardiac shadow is enlarged but stable. Lungs are well aerated bilaterally. No focal infiltrate or sizable effusion is seen. No acute bony abnormality is noted. Degenerative changes of the right shoulder are noted. Degenerative changes of the thoracic spine are seen. IMPRESSION: No acute abnormality noted  07/26/22- 76 year old female never smoker followed for OSA, chronic bronchitis, allergic rhinitis, complicated by nonalcoholic cirrhosis/[name] GI,  AFib/"Watchman" clot- preventing device placed in her atrial appendage, HBP, MI, chronic diastolic CHF, GERD, DM 2, hypothyroid  CPAP auto 5-15/APS/[facility]    Download- compliance  Body weight today- Covid vax-   4 Moderna        ROS-see HPI  + = positive Constitutional:   No-   weight loss, night sweats, fevers,  chills, + fatigue, lassitude. HEENT:   No-  headaches, difficulty swallowing, tooth/dental problems, sore throat,       No-  sneezing, itching, ear ache, nasal congestion, post nasal drip,  CV:  No-   chest pain, orthopnea, PND, swelling in lower extremities, anasarca, dizziness, palpitations Resp:+ shortness of breath with exertion or at rest.             productive cough,  + non-productive cough,  No- coughing up of blood.              No-   change in color of mucus.  No- wheezing.   Skin: No-   rash or lesions. GI:  No-   heartburn, indigestion, abdominal pain, nausea, vomiting,  GU:  MS:  + joint pain or swelling. + back pain. Neuro-     nothing unusual Psych:  No- change in mood or affect. No depression or anxiety.  No memory loss.  OBJ- Physical Exam General- Alert, Oriented, Affect-appropriate, Distress- none acute,  wheelchair + obesity Skin-  + bleeding mole R cheek she associates with CPAP headgear. Lymphadenopathy- none Head- atraumatic            Eyes- Gross vision intact, PERRLA, conjunctivae and secretions clear            Ears- Hearing, canals-normal            Nose- Clear, no-Septal dev, mucus, polyps, erosion, perforation             Throat- Mallampati II-III , mucosa clear , drainage- none, tonsils- atrophic Neck- flexible , trachea midline,  no stridor , thyroid nl, carotid no bruit Chest - symmetrical excursion , unlabored           Heart/CV- IRR/Afib , no murmur , no gallop  , no rub, nl s1 s2                           - JVD- none , edema- none, stasis changes +, varices- none           Lung- +fine crackles, wheeze- none, cough- none , dullness-none, rub- none           Chest wall-  Abd-  Br/ Gen/ Rectal- Not done, not indicated Extrem- +braces on ankles Neuro- grossly intact to observation

## 2022-07-26 ENCOUNTER — Ambulatory Visit: Payer: Medicare Other | Admitting: Internal Medicine

## 2022-07-27 ENCOUNTER — Other Ambulatory Visit: Payer: Self-pay | Admitting: Family Medicine

## 2022-07-27 DIAGNOSIS — R928 Other abnormal and inconclusive findings on diagnostic imaging of breast: Secondary | ICD-10-CM

## 2022-07-31 DIAGNOSIS — N390 Urinary tract infection, site not specified: Secondary | ICD-10-CM | POA: Diagnosis not present

## 2022-07-31 DIAGNOSIS — B372 Candidiasis of skin and nail: Secondary | ICD-10-CM | POA: Diagnosis not present

## 2022-07-31 DIAGNOSIS — R3129 Other microscopic hematuria: Secondary | ICD-10-CM | POA: Diagnosis not present

## 2022-07-31 DIAGNOSIS — R3989 Other symptoms and signs involving the genitourinary system: Secondary | ICD-10-CM | POA: Diagnosis not present

## 2022-07-31 DIAGNOSIS — N3281 Overactive bladder: Secondary | ICD-10-CM | POA: Diagnosis not present

## 2022-08-02 DIAGNOSIS — M549 Dorsalgia, unspecified: Secondary | ICD-10-CM | POA: Diagnosis not present

## 2022-08-02 DIAGNOSIS — R922 Inconclusive mammogram: Secondary | ICD-10-CM | POA: Diagnosis not present

## 2022-08-09 ENCOUNTER — Other Ambulatory Visit: Payer: Self-pay | Admitting: Family Medicine

## 2022-08-09 ENCOUNTER — Ambulatory Visit
Admission: RE | Admit: 2022-08-09 | Discharge: 2022-08-09 | Disposition: A | Discharge status: Home / Self Care | Payer: Medicare Other | Source: Ambulatory Visit | Attending: Family Medicine | Admitting: Family Medicine

## 2022-08-09 DIAGNOSIS — R928 Other abnormal and inconclusive findings on diagnostic imaging of breast: Secondary | ICD-10-CM

## 2022-08-09 DIAGNOSIS — R921 Mammographic calcification found on diagnostic imaging of breast: Secondary | ICD-10-CM | POA: Diagnosis not present

## 2022-08-09 DIAGNOSIS — N62 Hypertrophy of breast: Secondary | ICD-10-CM | POA: Diagnosis not present

## 2022-08-18 NOTE — Progress Notes (Deleted)
HPI [name] [name] never smoker, followed for OSA, chronic bronchitis, allergic rhinitis, complicated by nonalcoholic cirrhosis/[name] GI,   A. Fib/"Watchman" clot- preventing device placed in her atrial appendage , HBP, MI, chronic diastolic CHF, GERD, DM 2, hypothyroid NPSG 11/05/01-AHI 93/hour, desaturation to 63%, body weight 217 pounds PFT 07/26/09-mild restriction and reduction of diffusion. No obstruction, no response to dilator -----------------------------------------------------------------------------------   01/23/22- 76 year old female never smoker followed for OSA, chronic bronchitis, allergic rhinitis, complicated by nonalcoholic cirrhosis/[name] GI,  AFib/"Watchman" clot- preventing device placed in her atrial appendage, HBP, MI, chronic diastolic CHF, GERD, DM 2, hypothyroid  CPAP auto 5-15/[facility]    Download- compliance 27%, AHI 4.4/ hr Body weight today-207 lbs Covid vax-   4 Moderna                                    No inhalers, no recent cxr Flu vax-had Says she falls asleep in chair and husband won't wake her to get to bed and put CPAP on.  Breathing ok with no recent infection.  CXR 01/23/21- FINDINGS: Cardiac shadow is enlarged but stable. Lungs are well aerated bilaterally. No focal infiltrate or sizable effusion is seen. No acute bony abnormality is noted. Degenerative changes of the right shoulder are noted. Degenerative changes of the thoracic spine are seen. IMPRESSION: No acute abnormality noted  08/20/22-  76 year old female never smoker followed for OSA, chronic bronchitis, allergic rhinitis, complicated by nonalcoholic Cirrhosis/[name] GI,  AFib/"Watchman" clot- preventing device placed in her atrial appendage, HTN, MI, chronic diastolic CHF, GERD, DM 2, hypothyroid  CPAP auto 5-15/APS/[facility]    Download- compliance  Body weight today Covid vax-   4 Moderna                                    Flu vax-  ROS-see HPI  + = positive Constitutional:   No-    weight loss, night sweats, fevers, chills, + fatigue, lassitude. HEENT:   No-  headaches, difficulty swallowing, tooth/dental problems, sore throat,       No-  sneezing, itching, ear ache, nasal congestion, post nasal drip,  CV:  No-   chest pain, orthopnea, PND, swelling in lower extremities, anasarca, dizziness, palpitations Resp:+ shortness of breath with exertion or at rest.             productive cough,  + non-productive cough,  No- coughing up of blood.              No-   change in color of mucus.  No- wheezing.   Skin: No-   rash or lesions. GI:  No-   heartburn, indigestion, abdominal pain, nausea, vomiting,  GU:  MS:  + joint pain or swelling. + back pain. Neuro-     nothing unusual Psych:  No- change in mood or affect. No depression or anxiety.  No memory loss.  OBJ- Physical Exam General- Alert, Oriented, Affect-appropriate, Distress- none acute,  wheelchair + obesity Skin-  + bleeding mole R cheek she associates with CPAP headgear. Lymphadenopathy- none Head- atraumatic            Eyes- Gross vision intact, PERRLA, conjunctivae and secretions clear            Ears- Hearing, canals-normal            Nose- Clear, no-Septal dev,  mucus, polyps, erosion, perforation             Throat- Mallampati II-III , mucosa clear , drainage- none, tonsils- atrophic Neck- flexible , trachea midline, no stridor , thyroid nl, carotid no bruit Chest - symmetrical excursion , unlabored           Heart/CV- IRR/Afib , no murmur , no gallop  , no rub, nl s1 s2                           - JVD- none , edema- none, stasis changes +, varices- none           Lung- +fine crackles, wheeze- none, cough- none , dullness-none, rub- none           Chest wall-  Abd-  Br/ Gen/ Rectal- Not done, not indicated Extrem- +braces on ankles Neuro- grossly intact to observation

## 2022-08-20 ENCOUNTER — Ambulatory Visit: Payer: Medicare Other | Admitting: Internal Medicine

## 2022-08-29 DIAGNOSIS — N390 Urinary tract infection, site not specified: Secondary | ICD-10-CM | POA: Diagnosis not present

## 2022-08-29 DIAGNOSIS — R3129 Other microscopic hematuria: Secondary | ICD-10-CM | POA: Diagnosis not present

## 2022-08-29 DIAGNOSIS — R3989 Other symptoms and signs involving the genitourinary system: Secondary | ICD-10-CM | POA: Diagnosis not present

## 2022-09-02 NOTE — Progress Notes (Unsigned)
HPI F never smoker, followed for OSA, chronic bronchitis, allergic rhinitis, complicated by nonalcoholic cirrhosis/[name] GI,   A. Fib/"Watchman" clot- preventing device placed in her atrial appendage , HBP, MI, chronic diastolic CHF, GERD, DM 2, hypothyroid NPSG 11/05/01-AHI 93/hour, desaturation to 63%, body weight 217 pounds PFT 07/26/09-mild restriction and reduction of diffusion. No obstruction, no response to dilator -----------------------------------------------------------------------------------   01/23/22- 76 year old female never smoker followed for OSA, chronic bronchitis, allergic rhinitis, complicated by nonalcoholic cirrhosis/[name] GI,  AFib/"Watchman" clot- preventing device placed in her atrial appendage, HBP, MI, chronic diastolic CHF, GERD, DM 2, hypothyroid  CPAP auto 5-15/APS/[facility]    Download- compliance 27%, AHI 4.4/ hr Body weight today-207 lbs Covid vax-   4 Moderna                                    No inhalers, no recent cxr Flu vax-had Says she falls asleep in chair and husband won't wake her to get to bed and put CPAP on.  Breathing ok with no recent infection.  CXR 01/23/21- FINDINGS: Cardiac shadow is enlarged but stable. Lungs are well aerated bilaterally. No focal infiltrate or sizable effusion is seen. No acute bony abnormality is noted. Degenerative changes of the right shoulder are noted. Degenerative changes of the thoracic spine are seen. IMPRESSION: No acute abnormality noted  09/04/22-  76 year old female never smoker followed for OSA, chronic bronchitis, allergic rhinitis, complicated by nonalcoholic Cirrhosis/[name] GI,  AFib/"Watchman" clot- preventing device placed in her atrial appendage, HTN, MI, chronic diastolic CHF, GERD, DM 2, hypothyroid, Hx Splenectomy, CPAP auto 5-15/APS/[facility]    Download- compliance 67%, AHI 3.7/ hr Body weight today 194 lbs Covid vax-   4 Moderna                                    Flu vax- We are out  of senior strength today Download reviewed.  Mask leaks and less strapped on very tight.  She is interested in mask fitting referral.  Sleep is disturbed by frequent nocturia-interstitial cystitis.  Uses rolling walker and no longer drives herself.  Depends on husband's own health is not good. Chronic bronchitis symptoms have almost completely disappeared.  She no longer uses any inhalers and feels her breathing is comfortable. Being actively managed for high blood pressure.  ROS-see HPI  + = positive Constitutional:   No-   weight loss, night sweats, fevers, chills, + fatigue, lassitude. HEENT:   No-  headaches, difficulty swallowing, tooth/dental problems, sore throat,       No-  sneezing, itching, ear ache, nasal congestion, post nasal drip,  CV:  No-   chest pain, orthopnea, PND, swelling in lower extremities, anasarca, dizziness, palpitations Resp:+ shortness of breath with exertion or at rest.             productive cough,  + non-productive cough,  No- coughing up of blood.              No-   change in color of mucus.  No- wheezing.   Skin: No-   rash or lesions. GI:  No-   heartburn, indigestion, abdominal pain, nausea, vomiting,  GU:  MS:  + joint pain or swelling. + back pain. Neuro-     nothing unusual Psych:  No- change in mood or affect. No depression or  anxiety.  No memory loss.  OBJ- Physical Exam General- Alert, Oriented, Affect-appropriate, Distress- none acute, + rolling walker + obesity Skin-  + bleeding mole R cheek she associates with CPAP headgear. Lymphadenopathy- none Head- atraumatic            Eyes- Gross vision intact, PERRLA, conjunctivae and secretions clear            Ears- Hearing, canals-normal            Nose- Clear, no-Septal dev, mucus, polyps, erosion, perforation             Throat- Mallampati II-III , mucosa clear , drainage- none, tonsils- atrophic Neck- flexible , trachea midline, no stridor , thyroid nl, carotid no bruit Chest - symmetrical  excursion , unlabored           Heart/CV- IRR/Afib , no murmur , no gallop  , no rub, nl s1 s2                           - JVD- none , edema- none, stasis changes +, varices- none           Lung- +few crackles, wheeze- none, cough- none , dullness-none, rub- none           Chest wall-  Abd-  Br/ Gen/ Rectal- Not done, not indicated Extrem- +braces on ankles Neuro- grossly intact to observation

## 2022-09-04 ENCOUNTER — Encounter: Payer: Self-pay | Admitting: Internal Medicine

## 2022-09-04 ENCOUNTER — Ambulatory Visit (INDEPENDENT_AMBULATORY_CARE_PROVIDER_SITE_OTHER): Payer: Medicare Other | Admitting: Internal Medicine

## 2022-09-04 VITALS — BP 142/88 | HR 86 | Ht 63.0 in | Wt 194.4 lb

## 2022-09-04 DIAGNOSIS — I2510 Atherosclerotic heart disease of native coronary artery without angina pectoris: Secondary | ICD-10-CM

## 2022-09-04 DIAGNOSIS — G4733 Obstructive sleep apnea (adult) (pediatric): Secondary | ICD-10-CM | POA: Diagnosis not present

## 2022-09-04 DIAGNOSIS — J410 Simple chronic bronchitis: Secondary | ICD-10-CM | POA: Diagnosis not present

## 2022-09-04 NOTE — Patient Instructions (Signed)
Order- refer to sleep center for mask fitting    dx OSA  We can continue CPAP auto 5-15  Please call if we can help

## 2022-09-04 NOTE — Assessment & Plan Note (Signed)
In remission now.  Not sure what changes have been important but doing very well without any respiratory medication.  We will watch how she does if she catches a cold this winter.

## 2022-09-04 NOTE — Assessment & Plan Note (Signed)
Benefits from CPAP.  Has been trying to do better.  Having to strap mask on tightly. Plan-continue auto 5-15.  Refer for mask fitting.

## 2022-09-10 ENCOUNTER — Other Ambulatory Visit: Payer: Self-pay | Admitting: Cardiology

## 2022-09-14 ENCOUNTER — Other Ambulatory Visit: Payer: Self-pay | Admitting: Cardiology

## 2022-09-14 NOTE — Telephone Encounter (Signed)
Nitrostat sub tablet refill sent to pharmacy

## 2022-09-20 DIAGNOSIS — N390 Urinary tract infection, site not specified: Secondary | ICD-10-CM | POA: Diagnosis not present

## 2022-09-21 DIAGNOSIS — Z23 Encounter for immunization: Secondary | ICD-10-CM | POA: Diagnosis not present

## 2022-09-25 ENCOUNTER — Ambulatory Visit (HOSPITAL_BASED_OUTPATIENT_CLINIC_OR_DEPARTMENT_OTHER): Payer: Medicare Other | Attending: Internal Medicine | Admitting: Radiology

## 2022-09-25 DIAGNOSIS — G4733 Obstructive sleep apnea (adult) (pediatric): Secondary | ICD-10-CM

## 2022-09-26 ENCOUNTER — Other Ambulatory Visit: Payer: Self-pay | Admitting: Cardiology

## 2022-10-01 DIAGNOSIS — E7800 Pure hypercholesterolemia, unspecified: Secondary | ICD-10-CM | POA: Diagnosis not present

## 2022-10-01 DIAGNOSIS — E113219 Type 2 diabetes mellitus with mild nonproliferative diabetic retinopathy with macular edema, unspecified eye: Secondary | ICD-10-CM | POA: Diagnosis not present

## 2022-10-01 DIAGNOSIS — G4730 Sleep apnea, unspecified: Secondary | ICD-10-CM | POA: Diagnosis not present

## 2022-10-01 DIAGNOSIS — I4891 Unspecified atrial fibrillation: Secondary | ICD-10-CM | POA: Diagnosis not present

## 2022-10-01 DIAGNOSIS — Z87440 Personal history of urinary (tract) infections: Secondary | ICD-10-CM | POA: Diagnosis not present

## 2022-10-01 DIAGNOSIS — I10 Essential (primary) hypertension: Secondary | ICD-10-CM | POA: Diagnosis not present

## 2022-10-01 DIAGNOSIS — Z6836 Body mass index (BMI) 36.0-36.9, adult: Secondary | ICD-10-CM | POA: Diagnosis not present

## 2022-10-01 DIAGNOSIS — E669 Obesity, unspecified: Secondary | ICD-10-CM | POA: Diagnosis not present

## 2022-10-01 DIAGNOSIS — E1142 Type 2 diabetes mellitus with diabetic polyneuropathy: Secondary | ICD-10-CM | POA: Diagnosis not present

## 2022-10-01 DIAGNOSIS — E039 Hypothyroidism, unspecified: Secondary | ICD-10-CM | POA: Diagnosis not present

## 2022-10-01 DIAGNOSIS — F325 Major depressive disorder, single episode, in full remission: Secondary | ICD-10-CM | POA: Diagnosis not present

## 2022-10-05 ENCOUNTER — Telehealth: Payer: Self-pay

## 2022-10-05 NOTE — Telephone Encounter (Signed)
10/02/22 labs received from patients pcp. Scanned to chart media

## 2022-10-17 DIAGNOSIS — N390 Urinary tract infection, site not specified: Secondary | ICD-10-CM | POA: Diagnosis not present

## 2022-11-01 DIAGNOSIS — E113212 Type 2 diabetes mellitus with mild nonproliferative diabetic retinopathy with macular edema, left eye: Secondary | ICD-10-CM | POA: Diagnosis not present

## 2022-11-01 DIAGNOSIS — E113211 Type 2 diabetes mellitus with mild nonproliferative diabetic retinopathy with macular edema, right eye: Secondary | ICD-10-CM | POA: Diagnosis not present

## 2022-11-01 DIAGNOSIS — H04123 Dry eye syndrome of bilateral lacrimal glands: Secondary | ICD-10-CM | POA: Diagnosis not present

## 2022-11-07 DIAGNOSIS — N3281 Overactive bladder: Secondary | ICD-10-CM | POA: Diagnosis not present

## 2022-11-07 DIAGNOSIS — B372 Candidiasis of skin and nail: Secondary | ICD-10-CM | POA: Diagnosis not present

## 2022-11-07 DIAGNOSIS — R3129 Other microscopic hematuria: Secondary | ICD-10-CM | POA: Diagnosis not present

## 2022-11-07 DIAGNOSIS — R3989 Other symptoms and signs involving the genitourinary system: Secondary | ICD-10-CM | POA: Diagnosis not present

## 2022-11-07 DIAGNOSIS — N390 Urinary tract infection, site not specified: Secondary | ICD-10-CM | POA: Diagnosis not present

## 2022-11-08 DIAGNOSIS — K7460 Unspecified cirrhosis of liver: Secondary | ICD-10-CM | POA: Diagnosis not present

## 2022-11-08 DIAGNOSIS — K8020 Calculus of gallbladder without cholecystitis without obstruction: Secondary | ICD-10-CM | POA: Diagnosis not present

## 2022-11-14 DIAGNOSIS — K219 Gastro-esophageal reflux disease without esophagitis: Secondary | ICD-10-CM | POA: Diagnosis not present

## 2022-11-14 DIAGNOSIS — D638 Anemia in other chronic diseases classified elsewhere: Secondary | ICD-10-CM | POA: Diagnosis not present

## 2022-11-14 DIAGNOSIS — K7460 Unspecified cirrhosis of liver: Secondary | ICD-10-CM | POA: Diagnosis not present

## 2022-12-18 DIAGNOSIS — R3989 Other symptoms and signs involving the genitourinary system: Secondary | ICD-10-CM | POA: Diagnosis not present

## 2022-12-18 DIAGNOSIS — R8281 Pyuria: Secondary | ICD-10-CM | POA: Diagnosis not present

## 2022-12-18 DIAGNOSIS — B3731 Acute candidiasis of vulva and vagina: Secondary | ICD-10-CM | POA: Insufficient documentation

## 2022-12-18 DIAGNOSIS — N952 Postmenopausal atrophic vaginitis: Secondary | ICD-10-CM | POA: Insufficient documentation

## 2022-12-18 DIAGNOSIS — R3129 Other microscopic hematuria: Secondary | ICD-10-CM | POA: Insufficient documentation

## 2022-12-21 DIAGNOSIS — E1165 Type 2 diabetes mellitus with hyperglycemia: Secondary | ICD-10-CM | POA: Diagnosis not present

## 2022-12-21 DIAGNOSIS — E1142 Type 2 diabetes mellitus with diabetic polyneuropathy: Secondary | ICD-10-CM | POA: Diagnosis not present

## 2022-12-21 DIAGNOSIS — E669 Obesity, unspecified: Secondary | ICD-10-CM | POA: Diagnosis not present

## 2022-12-21 DIAGNOSIS — E039 Hypothyroidism, unspecified: Secondary | ICD-10-CM | POA: Diagnosis not present

## 2022-12-24 NOTE — Progress Notes (Unsigned)
Cardiology Office Note:    Date:  12/25/2022   ID:  [name] [name], DOB [birth year], MRN [id number]  PCP:  Greig Right, MD  Cardiologist:  Shirlee More, MD    Referring MD: Greig Right, MD    ASSESSMENT:    1. Chronic atrial fibrillation ([place])   2. Presence of Watchman left atrial appendage closure device   3. Hypertensive heart disease with chronic diastolic congestive heart failure ([place])   4. Mild CAD   5. Mixed hyperlipidemia   6. Idiopathic cirrhosis ([facility])    PLAN:    In order of problems listed above:  [name] [name] has done very well from a cardiology perspective [name] [name] atrial fibrillation is rate controlled continue [name] [name] beta-blocker and is not anticoagulated.   Status post Watchman device no longer anticoagulated Heart failure is nicely compensated she will continue [name] [name] current loop diuretic as well as [name] [name] beta-blocker and calcium channel blocker ARB for hypertension Having no anginal discomfort New York Heart Association class I continue [name] [name] beta-blocker and oral nitrate Continue [name] [name] high intensity statin lipids are at target Doing quite well and is followed by [name] GI and she will   Next appointment: 6 months   Medication Adjustments/Labs and Tests Ordered: Current medicines are reviewed at length with the patient today.  Concerns regarding medicines are outlined above.  No orders of the defined types were placed in this encounter.  No orders of the defined types were placed in this encounter.   Chief Complaint  Patient presents with   Follow-up   Atrial Fibrillation   Congestive Heart Failure    History of Present Illness:    [name] [name] is a 77 y.o. female with a hx of nonanginal chest pain chronic atrial fibrillation with a watchman left atrial appendage closure device due to high risk of anticoagulation mild CAD hypertensive heart disease with chronic diastolic heart failure EF 55 to 60% and idiopathic cirrhosis last seen 06/01/2022.  Compliance with  diet, lifestyle and medications: Yes  Seen today with Leighton Ruff, CMA chaperone She has a caregiver with [name] [name] she is now supervising [name] [name] husband's care at home with dementia.  [name] [name] goal in life is for the 2 of them to remain in the house she Continues to see Dr. Nehemiah Settle, GI for [name] [name] liver disease She is seen regularly with [name] [name] PCP Dr. Burnett Sheng most recent labs 10/02/2022 cholesterol 128 LDL 60 A1c 7.8 hemoglobin 11.7 creatinine 0.9 potassium 4.3 She is not having edema shortness of breath orthopnea chest pain palpitation or syncope  She had a watchman left atrial occlusion device and is not anticoagulated with [name] [name] liver disease Past Medical History:  Diagnosis Date   Allergic rhinitis    Anxiety    ANXIETY 12/26/2007   Qualifier: Diagnosis of  By: Ronnald Ramp CNA/MA, Jessica     Arthritis    Bronchitis    Chest pain in adult 12/24/2018   CHF (congestive heart failure) ([place])    CHF, MILD 12/26/2007   Qualifier: Diagnosis of  By: Ronnald Ramp CNA/MA, Jessica     Chronic atrial fibrillation ([place]) 04/24/2016   Watchman atrial appendage device placed at [facility] for clot pevention   Chronic diastolic heart failure ([place])    Chronic liver disease 03/15/2016   Closed fracture of proximal tibia 08/09/1940   Complication of anesthesia    low blood pressure once   Depression    Enlarged heart    Esophageal reflux 07/04/2017   Essential hypertension 12/26/2007   Qualifier: Diagnosis of  By: Ronnald Ramp CNA/MA, Jessica     Fibromyalgia    GERD (gastroesophageal reflux disease)    HTN (hypertension)    on medication since age 38   Hypercholesteremia    Hyperlipidemia 02/29/2016   Hypertensive heart disease    on medication since age 15   Hypothyroidism 03/20/2016   Idiopathic cirrhosis ([place])    stage 4; sees [name] in [place]   Mild CAD 02/29/2016   Myocardial infarction [facility])    age 19   Neuropathy, peripheral    lower extremities   OSA (obstructive sleep apnea)    OSA on CPAP 07/04/2008   CPAP AutoSet/ [facility]     Presence of Watchman left atrial appendage closure device 01/30/2018   Recurrent UTI 08/04/2021   Seasonal and perennial allergic rhinitis 01/23/2011   Allergy vaccine restarted at 1:50 08/02/2011 [place], [place] 2016    Simple chronic bronchitis ([place]) 12/26/2007   PFT 07/26/09-mild restriction and reduction of diffusion. No obstruction, no response to dilator    Sleep apnea    Somnolence    SOMNOLENCE 12/26/2007   Annotation: excessive daytime Qualifier: Diagnosis of  By: Ronnald Ramp CNA/MA, Jessica     Splenomegaly    Swelling of joint, knee, right 01/30/2018   Type 2 diabetes mellitus ([place]) 12/29/2007   Qualifier: Diagnosis of  By: Annamaria Boots MD, [place] D    Type 2 diabetes mellitus without complications ([place])    Type II or unspecified type diabetes mellitus without mention of complication, not stated as uncontrolled    Urinary incontinence, nocturnal enuresis    Urinary, incontinence, stress female    wears depends    Past Surgical History:  Procedure Laterality Date   ABDOMINAL HYSTERECTOMY     APPENDECTOMY     BACK SURGERY     CARPAL TUNNEL RELEASE     bilaterally   CATARACT EXTRACTION W/ INTRAOCULAR LENS  IMPLANT, BILATERAL     DILATION AND CURETTAGE OF UTERUS     EYE SURGERY     cataract ext/ iol implants   KNEE ARTHROSCOPY     LUMBAR LAMINECTOMY  03/2011; 01/2012   MOUTH SURGERY     SPLENECTOMY     TONSILLECTOMY AND ADENOIDECTOMY     TOTAL ABDOMINAL HYSTERECTOMY      Current Medications: Current Meds  Medication Sig   alendronate (FOSAMAX) 70 MG tablet Take 70 mg by mouth once a week.    amLODipine (NORVASC) 5 MG tablet Take 5 mg by mouth 2 (two) times daily.   amoxicillin (AMOXIL) 500 MG capsule Take 500 mg by mouth as needed (dental procedures).   buPROPion (WELLBUTRIN XL) 300 MG 24 hr tablet Take 300 mg by mouth daily.   carvedilol (COREG) 6.25 MG tablet Take 6.25 mg by mouth in the morning and at bedtime.   doxazosin (CARDURA) 4 MG tablet Take 1 tablet (4 mg total) by mouth daily.    furosemide (LASIX) 20 MG tablet TAKE 1 TABLET BY MOUTH FOR 2 DAYS THEN TAKE ONLY ON MONDAYS, WEDNESDAYS AND FRIDAYS   gabapentin (NEURONTIN) 600 MG tablet Take 600 mg by mouth 3 (three) times daily.    irbesartan (AVAPRO) 300 MG tablet Take 300 mg by mouth daily.   isosorbide mononitrate (IMDUR) 60 MG 24 hr tablet Take 60 mg by mouth daily.   levothyroxine (SYNTHROID, LEVOTHROID) 125 MCG tablet Take 125 mcg by mouth daily before breakfast.    metFORMIN (GLUCOPHAGE-XR) 500 MG 24 hr tablet Take 500 mg by mouth 2 (two) times daily.  nabumetone (RELAFEN) 500 MG tablet Take 500 mg by mouth in the morning and at bedtime.   NITROSTAT 0.4 MG SL tablet DISSOLVE 1 TABLET UNDER THE TONGUE AS NEEDED FOR CHEST PAIN.   omeprazole (PRILOSEC) 40 MG capsule Take 1 capsule by mouth 2 (two) times daily.   potassium chloride SA (K-DUR,KLOR-CON) 20 MEQ tablet Take 40 mEq by mouth 3 (three) times daily.    rosuvastatin (CRESTOR) 5 MG tablet TAKE 1 TABLET BY MOUTH DAILY.   TOVIAZ 4 MG TB24 tablet Take 1 tablet by mouth daily.   traMADol (ULTRAM) 50 MG tablet Take 50 mg by mouth every 6 (six) hours as needed for moderate pain or severe pain. For pain   venlafaxine XR (EFFEXOR-XR) 150 MG 24 hr capsule Take 150 mg by mouth daily with breakfast.    Vitamin D, Ergocalciferol, (DRISDOL) 1.25 MG (50000 UNIT) CAPS capsule Take 50,000 Units by mouth once a week.     Allergies:   Azithromycin, Cefuroxime axetil, Celecoxib, Codeine, Hydralazine hcl, Sulfa antibiotics, Amlodipine, Canagliflozin, Cefuroxime, Exenatide, Hydralazine hcl, Other, and Norvasc [amlodipine besylate]   Social History   Socioeconomic History   Marital status: Married    Spouse name: Richard   Number of children: Not on file   Years of education: Not on file   Highest education level: Not on file  Occupational History   Not on file  Tobacco Use   Smoking status: Never   Smokeless tobacco: Never  Vaping Use   Vaping Use: Never used  Substance  and Sexual Activity   Alcohol use: No   Drug use: No   Sexual activity: Not Currently  Other Topics Concern   Not on file  Social History Narrative   Husband has neurological  issues seen by 4 neuro no dx essential tremors r/o parkinson   [name] [name] Malstrom assists with food    Social Determinants of Health   Financial Resource Strain: Low Risk  (08/04/2021)   Overall Financial Resource Strain (CARDIA)    Difficulty of Paying Living Expenses: Not hard at all  Food Insecurity: No Food Insecurity (11/30/2021)   Hunger Vital Sign    Worried About Running Out of Food in the Last Year: Never true    [place] in the Last Year: Never true  Transportation Needs: No Transportation Needs (11/30/2021)   PRAPARE - Hydrologist (Medical): No    Lack of Transportation (Non-Medical): No  Physical Activity: Not on file  Stress: No Stress Concern Present (08/04/2021)   [place]    Feeling of Stress : Only a little  Social Connections: Socially Integrated (08/04/2021)   Social Connection and Isolation Panel [NHANES]    Frequency of Communication with Friends and Family: Three times a week    Frequency of Social Gatherings with Friends and Family: Three times a week    Attends Religious Services: 1 to 4 times per year    Active Member of Clubs or Organizations: Yes    Attends Archivist Meetings: 1 to 4 times per year    Marital Status: Married     Family History: The patient's family history includes Anesthesia problems in [name] [name] mother; CAD in [name] [name] father; Cancer in [name] [name] mother; Diabetes in [name] [name] paternal grandmother; Emphysema in [name] [name] mother; Heart attack in [name] [name] father; Hypertension in [name] [name] father; Rheum arthritis in [name] [name] mother; Stroke in [name] [name] father. ROS:   Please see the history of  present illness.    All other systems reviewed and are negative.  EKGs/Labs/Other Studies Reviewed:    The following  studies were reviewed today:  See history   Physical Exam:    VS:  BP (!) 144/56 (BP Location: Right Arm, Patient Position: Sitting)   Pulse 78   Ht '5\' 3"'$  (1.6 m)   Wt 186 lb (84.4 kg)   SpO2 95%   BMI 32.95 kg/m     Wt Readings from Last 3 Encounters:  12/25/22 186 lb (84.4 kg)  09/25/22 194 lb (88 kg)  09/04/22 194 lb 6.4 oz (88.2 kg)     GEN:  Well nourished, well developed in no acute distress HEENT: Normal NECK: No JVD; No carotid bruits LYMPHATICS: No lymphadenopathy CARDIAC: Irregular rate and rhythm variable first heart sound  RESPIRATORY:  Clear to auscultation without rales, wheezing or rhonchi  ABDOMEN: Soft, non-tender, non-distended MUSCULOSKELETAL:  No edema; No deformity  SKIN: Warm and dry NEUROLOGIC:  Alert and oriented x 3 PSYCHIATRIC:  Normal affect    Signed, Shirlee More, MD  12/25/2022 11:12 AM    [place]

## 2022-12-25 ENCOUNTER — Encounter: Payer: Self-pay | Admitting: Cardiology

## 2022-12-25 ENCOUNTER — Ambulatory Visit: Payer: Medicare Other | Attending: Cardiology | Admitting: Cardiology

## 2022-12-25 VITALS — BP 144/56 | HR 78 | Ht 63.0 in | Wt 186.0 lb

## 2022-12-25 DIAGNOSIS — K7460 Unspecified cirrhosis of liver: Secondary | ICD-10-CM | POA: Diagnosis not present

## 2022-12-25 DIAGNOSIS — Z95818 Presence of other cardiac implants and grafts: Secondary | ICD-10-CM | POA: Diagnosis not present

## 2022-12-25 DIAGNOSIS — I5032 Chronic diastolic (congestive) heart failure: Secondary | ICD-10-CM

## 2022-12-25 DIAGNOSIS — E782 Mixed hyperlipidemia: Secondary | ICD-10-CM | POA: Diagnosis not present

## 2022-12-25 DIAGNOSIS — I2510 Atherosclerotic heart disease of native coronary artery without angina pectoris: Secondary | ICD-10-CM | POA: Diagnosis not present

## 2022-12-25 DIAGNOSIS — I110 Hypertensive heart disease with heart failure: Secondary | ICD-10-CM

## 2022-12-25 DIAGNOSIS — I482 Chronic atrial fibrillation, unspecified: Secondary | ICD-10-CM | POA: Diagnosis not present

## 2022-12-25 NOTE — Patient Instructions (Signed)
Medication Instructions:  Your physician recommends that you continue on your current medications as directed. Please refer to the Current Medication list given to you today.  *If you need a refill on your cardiac medications before your next appointment, please call your pharmacy*   Lab Work: None If you have labs (blood work) drawn today and your tests are completely normal, you will receive your results only by: MyChart Message (if you have MyChart) OR A paper copy in the mail If you have any lab test that is abnormal or we need to change your treatment, we will call you to review the results.   Testing/Procedures: None   Follow-Up: At [facility], you and your health needs are our priority.  As part of our continuing mission to provide you with exceptional heart care, we have created designated Provider Care Teams.  These Care Teams include your primary Cardiologist (physician) and Advanced Practice Providers (APPs -  Physician Assistants and Nurse Practitioners) who all work together to provide you with the care you need, when you need it.  We recommend signing up for the patient portal called "MyChart".  Sign up information is provided on this After Visit Summary.  MyChart is used to connect with patients for Virtual Visits (Telemedicine).  Patients are able to view lab/test results, encounter notes, upcoming appointments, etc.  Non-urgent messages can be sent to your provider as well.   To learn more about what you can do with MyChart, go to [email or web address].    Your next appointment:   6 month(s)  Provider:   Brian Munley, MD    Other Instructions None  

## 2022-12-31 DIAGNOSIS — E1165 Type 2 diabetes mellitus with hyperglycemia: Secondary | ICD-10-CM | POA: Diagnosis not present

## 2022-12-31 DIAGNOSIS — E1142 Type 2 diabetes mellitus with diabetic polyneuropathy: Secondary | ICD-10-CM | POA: Diagnosis not present

## 2022-12-31 DIAGNOSIS — E039 Hypothyroidism, unspecified: Secondary | ICD-10-CM | POA: Diagnosis not present

## 2022-12-31 DIAGNOSIS — E669 Obesity, unspecified: Secondary | ICD-10-CM | POA: Diagnosis not present

## 2023-01-01 DIAGNOSIS — E669 Obesity, unspecified: Secondary | ICD-10-CM | POA: Diagnosis not present

## 2023-01-01 DIAGNOSIS — E1142 Type 2 diabetes mellitus with diabetic polyneuropathy: Secondary | ICD-10-CM | POA: Diagnosis not present

## 2023-01-01 DIAGNOSIS — I10 Essential (primary) hypertension: Secondary | ICD-10-CM | POA: Diagnosis not present

## 2023-01-01 DIAGNOSIS — E7800 Pure hypercholesterolemia, unspecified: Secondary | ICD-10-CM | POA: Diagnosis not present

## 2023-01-01 DIAGNOSIS — Z6835 Body mass index (BMI) 35.0-35.9, adult: Secondary | ICD-10-CM | POA: Diagnosis not present

## 2023-01-01 DIAGNOSIS — E039 Hypothyroidism, unspecified: Secondary | ICD-10-CM | POA: Diagnosis not present

## 2023-01-01 DIAGNOSIS — I482 Chronic atrial fibrillation, unspecified: Secondary | ICD-10-CM | POA: Diagnosis not present

## 2023-01-04 ENCOUNTER — Ambulatory Visit (INDEPENDENT_AMBULATORY_CARE_PROVIDER_SITE_OTHER): Payer: Medicare Other | Admitting: Podiatry

## 2023-01-04 DIAGNOSIS — E1142 Type 2 diabetes mellitus with diabetic polyneuropathy: Secondary | ICD-10-CM

## 2023-01-04 DIAGNOSIS — M79675 Pain in left toe(s): Secondary | ICD-10-CM

## 2023-01-04 DIAGNOSIS — M79674 Pain in right toe(s): Secondary | ICD-10-CM | POA: Diagnosis not present

## 2023-01-04 DIAGNOSIS — L84 Corns and callosities: Secondary | ICD-10-CM

## 2023-01-04 DIAGNOSIS — M21371 Foot drop, right foot: Secondary | ICD-10-CM

## 2023-01-04 DIAGNOSIS — M2042 Other hammer toe(s) (acquired), left foot: Secondary | ICD-10-CM

## 2023-01-04 DIAGNOSIS — B351 Tinea unguium: Secondary | ICD-10-CM | POA: Diagnosis not present

## 2023-01-04 DIAGNOSIS — M2142 Flat foot [pes planus] (acquired), left foot: Secondary | ICD-10-CM

## 2023-01-04 DIAGNOSIS — M21372 Foot drop, left foot: Secondary | ICD-10-CM

## 2023-01-04 DIAGNOSIS — M2141 Flat foot [pes planus] (acquired), right foot: Secondary | ICD-10-CM

## 2023-01-04 NOTE — Progress Notes (Signed)
  Subjective:  Patient ID: [name] [name], female    DOB: 05-27-1946,  MRN: [id number]  Chief Complaint  Patient presents with   Nail Problem    Diabetic Foot Care    Hammer Toe    Hammertoe to left foot causing sores to the top of toes.     77 y.o. female presents with the above complaint. History confirmed with patient. Patient presenting with pain related to dystrophic thickened elongated nails. Patient is unable to trim own nails related to nail dystrophy and/or mobility issues. Patient does have a history of T2DM.  She also reports a small scab on the top of the toe on the left foot and the second toe related to hammertoe deformity.  She is wondering if any type of diabetic shoes would be helpful in helping to prevent formation of scabs or wounds from breaking out.  She does have a history of neuropathy with her diabetes.  Patient also has a history of bilateral foot drop and wears AFO device dorsiflexor assist in the bilateral lower extremity.  Objective:  Physical Exam: warm, good capillary refill nail exam onychomycosis of the toenails, onycholysis, and dystrophic nails DP pulses palpable, PT pulses palpable, and protective sensation absent Left Foot:  Pain with palpation of nails due to elongation and dystrophic growth.  On the dorsal aspect of the left second toe there is noted to be a small eschar present but no underlying ulceration no erythema edema drainage or other signs infection.Hammertoe deformity present pes planus deformity is present as well with decreased longitudinal arch height. Right Foot: Pain with palpation of nails due to elongation and dystrophic growth.  Hammertoe deformity present pes planus deformity is present as well with decreased longitudinal arch height.  Assessment:   1. Pain due to onychomycosis of toenails of both feet   2. Hammertoe of left foot   3. Pes planus of both feet   4. Pre-ulcerative calluses   5. Bilateral foot-drop   6. DM type 2 with  diabetic peripheral neuropathy ([place])      Plan:  Patient was evaluated and treated and all questions answered.  # Diabetes type 2 with diabetic peripheral neuropathy as well as hammertoes bilateral foot and preulcerative callus on the dorsal aspect of the left second toe -Discussed with patient I believe she would benefit from diabetic shoes and liners related to her neuropathy diabetes and predisposition to developing ulcerations of the dorsal aspect of the toes.   -Recommend patient be fitted/casted for diabetic shoes and liners.  She will call the [place] office to arrange this.  Order has been placed.   #Onychomycosis with pain  -Nails palliatively debrided as below. -Educated on self-care  Procedure: Nail Debridement Rationale: Pain Type of Debridement: manual, sharp debridement. Instrumentation: Nail nipper, rotary burr. Number of Nails: 10  Return in about 3 months (around 04/04/2023) for [facility].         Everitt Amber, DPM [facility]

## 2023-01-21 DIAGNOSIS — R3989 Other symptoms and signs involving the genitourinary system: Secondary | ICD-10-CM | POA: Diagnosis not present

## 2023-01-21 DIAGNOSIS — N390 Urinary tract infection, site not specified: Secondary | ICD-10-CM | POA: Diagnosis not present

## 2023-01-21 DIAGNOSIS — R3129 Other microscopic hematuria: Secondary | ICD-10-CM | POA: Diagnosis not present

## 2023-01-21 DIAGNOSIS — B3731 Acute candidiasis of vulva and vagina: Secondary | ICD-10-CM | POA: Diagnosis not present

## 2023-01-21 DIAGNOSIS — N952 Postmenopausal atrophic vaginitis: Secondary | ICD-10-CM | POA: Diagnosis not present

## 2023-01-22 DIAGNOSIS — N7689 Other specified inflammation of vagina and vulva: Secondary | ICD-10-CM | POA: Diagnosis not present

## 2023-02-06 DIAGNOSIS — E039 Hypothyroidism, unspecified: Secondary | ICD-10-CM | POA: Diagnosis not present

## 2023-02-06 DIAGNOSIS — E669 Obesity, unspecified: Secondary | ICD-10-CM | POA: Diagnosis not present

## 2023-02-06 DIAGNOSIS — E1165 Type 2 diabetes mellitus with hyperglycemia: Secondary | ICD-10-CM | POA: Diagnosis not present

## 2023-02-06 DIAGNOSIS — Z794 Long term (current) use of insulin: Secondary | ICD-10-CM | POA: Diagnosis not present

## 2023-02-06 DIAGNOSIS — E1142 Type 2 diabetes mellitus with diabetic polyneuropathy: Secondary | ICD-10-CM | POA: Diagnosis not present

## 2023-03-19 DIAGNOSIS — I10 Essential (primary) hypertension: Secondary | ICD-10-CM | POA: Diagnosis not present

## 2023-03-19 DIAGNOSIS — E538 Deficiency of other specified B group vitamins: Secondary | ICD-10-CM | POA: Diagnosis not present

## 2023-03-19 DIAGNOSIS — F32A Depression, unspecified: Secondary | ICD-10-CM | POA: Diagnosis not present

## 2023-03-19 DIAGNOSIS — I6381 Other cerebral infarction due to occlusion or stenosis of small artery: Secondary | ICD-10-CM | POA: Diagnosis not present

## 2023-03-19 DIAGNOSIS — D649 Anemia, unspecified: Secondary | ICD-10-CM | POA: Diagnosis not present

## 2023-03-19 DIAGNOSIS — I252 Old myocardial infarction: Secondary | ICD-10-CM | POA: Diagnosis not present

## 2023-03-19 DIAGNOSIS — Z886 Allergy status to analgesic agent status: Secondary | ICD-10-CM | POA: Diagnosis not present

## 2023-03-19 DIAGNOSIS — Z87440 Personal history of urinary (tract) infections: Secondary | ICD-10-CM | POA: Diagnosis not present

## 2023-03-19 DIAGNOSIS — E7800 Pure hypercholesterolemia, unspecified: Secondary | ICD-10-CM | POA: Diagnosis not present

## 2023-03-19 DIAGNOSIS — M6281 Muscle weakness (generalized): Secondary | ICD-10-CM | POA: Diagnosis not present

## 2023-03-19 DIAGNOSIS — M549 Dorsalgia, unspecified: Secondary | ICD-10-CM | POA: Diagnosis not present

## 2023-03-19 DIAGNOSIS — R531 Weakness: Secondary | ICD-10-CM | POA: Diagnosis not present

## 2023-03-19 DIAGNOSIS — N3001 Acute cystitis with hematuria: Secondary | ICD-10-CM | POA: Diagnosis not present

## 2023-03-19 DIAGNOSIS — I4892 Unspecified atrial flutter: Secondary | ICD-10-CM | POA: Diagnosis not present

## 2023-03-19 DIAGNOSIS — K219 Gastro-esophageal reflux disease without esophagitis: Secondary | ICD-10-CM | POA: Diagnosis not present

## 2023-03-19 DIAGNOSIS — E119 Type 2 diabetes mellitus without complications: Secondary | ICD-10-CM | POA: Diagnosis not present

## 2023-03-19 DIAGNOSIS — R4182 Altered mental status, unspecified: Secondary | ICD-10-CM | POA: Diagnosis not present

## 2023-03-19 DIAGNOSIS — N390 Urinary tract infection, site not specified: Secondary | ICD-10-CM | POA: Diagnosis not present

## 2023-03-19 DIAGNOSIS — Z882 Allergy status to sulfonamides status: Secondary | ICD-10-CM | POA: Diagnosis not present

## 2023-03-19 DIAGNOSIS — M1990 Unspecified osteoarthritis, unspecified site: Secondary | ICD-10-CM | POA: Diagnosis not present

## 2023-03-19 DIAGNOSIS — I509 Heart failure, unspecified: Secondary | ICD-10-CM | POA: Diagnosis not present

## 2023-03-19 DIAGNOSIS — Z6834 Body mass index (BMI) 34.0-34.9, adult: Secondary | ICD-10-CM | POA: Diagnosis not present

## 2023-03-19 DIAGNOSIS — K7581 Nonalcoholic steatohepatitis (NASH): Secondary | ICD-10-CM | POA: Diagnosis not present

## 2023-03-19 DIAGNOSIS — M810 Age-related osteoporosis without current pathological fracture: Secondary | ICD-10-CM | POA: Diagnosis not present

## 2023-03-19 DIAGNOSIS — I5032 Chronic diastolic (congestive) heart failure: Secondary | ICD-10-CM | POA: Diagnosis not present

## 2023-03-19 DIAGNOSIS — R9431 Abnormal electrocardiogram [ECG] [EKG]: Secondary | ICD-10-CM | POA: Diagnosis not present

## 2023-03-19 DIAGNOSIS — I2510 Atherosclerotic heart disease of native coronary artery without angina pectoris: Secondary | ICD-10-CM | POA: Diagnosis not present

## 2023-03-19 DIAGNOSIS — I110 Hypertensive heart disease with heart failure: Secondary | ICD-10-CM | POA: Diagnosis not present

## 2023-03-19 DIAGNOSIS — F419 Anxiety disorder, unspecified: Secondary | ICD-10-CM | POA: Diagnosis not present

## 2023-03-19 DIAGNOSIS — Z794 Long term (current) use of insulin: Secondary | ICD-10-CM | POA: Diagnosis not present

## 2023-03-19 DIAGNOSIS — D519 Vitamin B12 deficiency anemia, unspecified: Secondary | ICD-10-CM | POA: Diagnosis not present

## 2023-03-19 DIAGNOSIS — Z885 Allergy status to narcotic agent status: Secondary | ICD-10-CM | POA: Diagnosis not present

## 2023-03-19 DIAGNOSIS — R000 Tachycardia, unspecified: Secondary | ICD-10-CM | POA: Diagnosis not present

## 2023-03-19 DIAGNOSIS — I4891 Unspecified atrial fibrillation: Secondary | ICD-10-CM | POA: Diagnosis not present

## 2023-03-19 DIAGNOSIS — E039 Hypothyroidism, unspecified: Secondary | ICD-10-CM | POA: Diagnosis not present

## 2023-03-19 DIAGNOSIS — E785 Hyperlipidemia, unspecified: Secondary | ICD-10-CM | POA: Diagnosis not present

## 2023-03-19 DIAGNOSIS — K8020 Calculus of gallbladder without cholecystitis without obstruction: Secondary | ICD-10-CM | POA: Diagnosis not present

## 2023-03-19 DIAGNOSIS — R2689 Other abnormalities of gait and mobility: Secondary | ICD-10-CM | POA: Diagnosis not present

## 2023-03-19 DIAGNOSIS — Z888 Allergy status to other drugs, medicaments and biological substances status: Secondary | ICD-10-CM | POA: Diagnosis not present

## 2023-03-19 DIAGNOSIS — F0390 Unspecified dementia without behavioral disturbance: Secondary | ICD-10-CM | POA: Diagnosis not present

## 2023-03-19 DIAGNOSIS — R918 Other nonspecific abnormal finding of lung field: Secondary | ICD-10-CM | POA: Diagnosis not present

## 2023-03-19 DIAGNOSIS — F329 Major depressive disorder, single episode, unspecified: Secondary | ICD-10-CM | POA: Diagnosis not present

## 2023-03-19 DIAGNOSIS — Z7401 Bed confinement status: Secondary | ICD-10-CM | POA: Diagnosis not present

## 2023-03-19 DIAGNOSIS — Z7984 Long term (current) use of oral hypoglycemic drugs: Secondary | ICD-10-CM | POA: Diagnosis not present

## 2023-03-22 DIAGNOSIS — I2510 Atherosclerotic heart disease of native coronary artery without angina pectoris: Secondary | ICD-10-CM | POA: Diagnosis not present

## 2023-03-22 DIAGNOSIS — I110 Hypertensive heart disease with heart failure: Secondary | ICD-10-CM | POA: Diagnosis not present

## 2023-03-22 DIAGNOSIS — E039 Hypothyroidism, unspecified: Secondary | ICD-10-CM | POA: Diagnosis not present

## 2023-03-22 DIAGNOSIS — E785 Hyperlipidemia, unspecified: Secondary | ICD-10-CM | POA: Diagnosis not present

## 2023-03-22 DIAGNOSIS — F0390 Unspecified dementia without behavioral disturbance: Secondary | ICD-10-CM | POA: Diagnosis not present

## 2023-03-22 DIAGNOSIS — D519 Vitamin B12 deficiency anemia, unspecified: Secondary | ICD-10-CM | POA: Diagnosis not present

## 2023-03-22 DIAGNOSIS — E538 Deficiency of other specified B group vitamins: Secondary | ICD-10-CM | POA: Diagnosis not present

## 2023-03-22 DIAGNOSIS — Z7401 Bed confinement status: Secondary | ICD-10-CM | POA: Diagnosis not present

## 2023-03-22 DIAGNOSIS — E119 Type 2 diabetes mellitus without complications: Secondary | ICD-10-CM | POA: Diagnosis not present

## 2023-03-22 DIAGNOSIS — N3001 Acute cystitis with hematuria: Secondary | ICD-10-CM | POA: Diagnosis not present

## 2023-03-22 DIAGNOSIS — R4182 Altered mental status, unspecified: Secondary | ICD-10-CM | POA: Diagnosis not present

## 2023-03-22 DIAGNOSIS — I4892 Unspecified atrial flutter: Secondary | ICD-10-CM | POA: Diagnosis not present

## 2023-03-22 DIAGNOSIS — M810 Age-related osteoporosis without current pathological fracture: Secondary | ICD-10-CM | POA: Diagnosis not present

## 2023-03-22 DIAGNOSIS — E1151 Type 2 diabetes mellitus with diabetic peripheral angiopathy without gangrene: Secondary | ICD-10-CM | POA: Diagnosis not present

## 2023-03-22 DIAGNOSIS — I5032 Chronic diastolic (congestive) heart failure: Secondary | ICD-10-CM | POA: Diagnosis not present

## 2023-03-22 DIAGNOSIS — D649 Anemia, unspecified: Secondary | ICD-10-CM | POA: Diagnosis not present

## 2023-03-22 DIAGNOSIS — M6281 Muscle weakness (generalized): Secondary | ICD-10-CM | POA: Diagnosis not present

## 2023-03-22 DIAGNOSIS — F329 Major depressive disorder, single episode, unspecified: Secondary | ICD-10-CM | POA: Diagnosis not present

## 2023-03-22 DIAGNOSIS — N390 Urinary tract infection, site not specified: Secondary | ICD-10-CM | POA: Diagnosis not present

## 2023-03-22 DIAGNOSIS — I10 Essential (primary) hypertension: Secondary | ICD-10-CM | POA: Diagnosis not present

## 2023-03-22 DIAGNOSIS — R262 Difficulty in walking, not elsewhere classified: Secondary | ICD-10-CM | POA: Diagnosis not present

## 2023-03-22 DIAGNOSIS — K219 Gastro-esophageal reflux disease without esophagitis: Secondary | ICD-10-CM | POA: Diagnosis not present

## 2023-03-22 DIAGNOSIS — K7581 Nonalcoholic steatohepatitis (NASH): Secondary | ICD-10-CM | POA: Diagnosis not present

## 2023-03-22 DIAGNOSIS — R2689 Other abnormalities of gait and mobility: Secondary | ICD-10-CM | POA: Diagnosis not present

## 2023-03-22 DIAGNOSIS — F419 Anxiety disorder, unspecified: Secondary | ICD-10-CM | POA: Diagnosis not present

## 2023-03-22 DIAGNOSIS — R531 Weakness: Secondary | ICD-10-CM | POA: Diagnosis not present

## 2023-03-23 DIAGNOSIS — E1151 Type 2 diabetes mellitus with diabetic peripheral angiopathy without gangrene: Secondary | ICD-10-CM | POA: Diagnosis not present

## 2023-03-23 DIAGNOSIS — N390 Urinary tract infection, site not specified: Secondary | ICD-10-CM | POA: Diagnosis not present

## 2023-03-23 DIAGNOSIS — R262 Difficulty in walking, not elsewhere classified: Secondary | ICD-10-CM | POA: Diagnosis not present

## 2023-03-23 DIAGNOSIS — D649 Anemia, unspecified: Secondary | ICD-10-CM | POA: Diagnosis not present

## 2023-03-27 ENCOUNTER — Other Ambulatory Visit: Payer: Self-pay | Admitting: *Deleted

## 2023-03-27 NOTE — Patient Outreach (Signed)
Per [facility],The Mrs. Piedra resides in [facility] skilled nursing facility. Screening for potential [facility] care coordination services as benefit of health plan and Primary Care Provider.   Secure communication sent to Shanda Bumps, Programmer, systems for collaboration about transition plans and potential [facility] care coordination needs.  Will continue to follow.   Raiford Noble, MSN, RN,BSN [facility] Post Acute Care Coordinator [phone] (Direct dial)

## 2023-04-02 ENCOUNTER — Other Ambulatory Visit: Payer: Self-pay | Admitting: *Deleted

## 2023-04-02 DIAGNOSIS — Z7984 Long term (current) use of oral hypoglycemic drugs: Secondary | ICD-10-CM | POA: Diagnosis not present

## 2023-04-02 DIAGNOSIS — I110 Hypertensive heart disease with heart failure: Secondary | ICD-10-CM | POA: Diagnosis not present

## 2023-04-02 DIAGNOSIS — F0280 Dementia in other diseases classified elsewhere without behavioral disturbance: Secondary | ICD-10-CM | POA: Diagnosis not present

## 2023-04-02 DIAGNOSIS — D649 Anemia, unspecified: Secondary | ICD-10-CM | POA: Diagnosis not present

## 2023-04-02 DIAGNOSIS — Z556 Problems related to health literacy: Secondary | ICD-10-CM | POA: Diagnosis not present

## 2023-04-02 DIAGNOSIS — F329 Major depressive disorder, single episode, unspecified: Secondary | ICD-10-CM | POA: Diagnosis not present

## 2023-04-02 DIAGNOSIS — I10 Essential (primary) hypertension: Secondary | ICD-10-CM

## 2023-04-02 DIAGNOSIS — I5032 Chronic diastolic (congestive) heart failure: Secondary | ICD-10-CM | POA: Diagnosis not present

## 2023-04-02 DIAGNOSIS — K7460 Unspecified cirrhosis of liver: Secondary | ICD-10-CM | POA: Diagnosis not present

## 2023-04-02 DIAGNOSIS — E538 Deficiency of other specified B group vitamins: Secondary | ICD-10-CM | POA: Diagnosis not present

## 2023-04-02 DIAGNOSIS — E039 Hypothyroidism, unspecified: Secondary | ICD-10-CM | POA: Diagnosis not present

## 2023-04-02 DIAGNOSIS — Z794 Long term (current) use of insulin: Secondary | ICD-10-CM | POA: Diagnosis not present

## 2023-04-02 DIAGNOSIS — I4892 Unspecified atrial flutter: Secondary | ICD-10-CM | POA: Diagnosis not present

## 2023-04-02 DIAGNOSIS — K7581 Nonalcoholic steatohepatitis (NASH): Secondary | ICD-10-CM | POA: Diagnosis not present

## 2023-04-02 DIAGNOSIS — E860 Dehydration: Secondary | ICD-10-CM | POA: Diagnosis not present

## 2023-04-02 DIAGNOSIS — F419 Anxiety disorder, unspecified: Secondary | ICD-10-CM | POA: Diagnosis not present

## 2023-04-02 DIAGNOSIS — I2510 Atherosclerotic heart disease of native coronary artery without angina pectoris: Secondary | ICD-10-CM | POA: Diagnosis not present

## 2023-04-02 DIAGNOSIS — K219 Gastro-esophageal reflux disease without esophagitis: Secondary | ICD-10-CM | POA: Diagnosis not present

## 2023-04-02 DIAGNOSIS — E785 Hyperlipidemia, unspecified: Secondary | ICD-10-CM | POA: Diagnosis not present

## 2023-04-02 DIAGNOSIS — E119 Type 2 diabetes mellitus without complications: Secondary | ICD-10-CM | POA: Diagnosis not present

## 2023-04-02 DIAGNOSIS — N3000 Acute cystitis without hematuria: Secondary | ICD-10-CM | POA: Diagnosis not present

## 2023-04-02 NOTE — Patient Outreach (Signed)
Per [facility] Mrs. Monsivais discharged from [facility] skilled nursing facility on 03/30/23. Screening for potential [facility] care coordination services as benefit of health plan and Primary Care Provider.  Telephone call made to Mrs. Sanroman [phone] to discuss [facility] care coordination services. Patient identifiers confirmed. Mrs. Sensing confirms she came home on Saturday. States home health is coming out today at 1230 pm. States she is not sure the name of home health agency.   Explained [facility] care coordination services. Mrs. Kleckley is agreeable. States she is calling PCP office today to schedule follow up appointment.   Explained [facility] care coordination services will not interfere or replace home health services.  Secure message sent to [facility] social worker to inquire about home health agency name.   Raiford Noble, MSN, RN,BSN [facility] Post Acute Care Coordinator [phone] (Direct dial)

## 2023-04-03 ENCOUNTER — Telehealth: Payer: Self-pay | Admitting: *Deleted

## 2023-04-03 NOTE — Progress Notes (Signed)
  Care Coordination   Note   04/03/2023 Name: [name] [name] MRN: [id number] DOB: 02/17/1946  [name] [name] is a 77 y.o. year old female who sees Alinda Deem, MD for primary care. I reached out to [place] by phone today to offer care coordination services.  [name] was given information about Care Coordination services today including:   The Care Coordination services include support from the care team which includes your Nurse Coordinator, Clinical Social Worker, or Pharmacist.  The Care Coordination team is here to help remove barriers to the health concerns and goals most important to you. Care Coordination services are voluntary, and the patient may decline or stop services at any time by request to their care team member.   Care Coordination Consent Status: Patient agreed to services and verbal consent obtained.   Follow up plan:  Telephone appointment with care coordination team member scheduled for:  04/10/23  Encounter Outcome:  Pt. Scheduled.  [facility]  Care Coordination Care Guide  Direct Dial: [phone]

## 2023-04-04 DIAGNOSIS — I4892 Unspecified atrial flutter: Secondary | ICD-10-CM | POA: Diagnosis not present

## 2023-04-04 DIAGNOSIS — I5032 Chronic diastolic (congestive) heart failure: Secondary | ICD-10-CM | POA: Diagnosis not present

## 2023-04-04 DIAGNOSIS — N3000 Acute cystitis without hematuria: Secondary | ICD-10-CM | POA: Diagnosis not present

## 2023-04-04 DIAGNOSIS — Z794 Long term (current) use of insulin: Secondary | ICD-10-CM | POA: Diagnosis not present

## 2023-04-04 DIAGNOSIS — E119 Type 2 diabetes mellitus without complications: Secondary | ICD-10-CM | POA: Diagnosis not present

## 2023-04-04 DIAGNOSIS — I110 Hypertensive heart disease with heart failure: Secondary | ICD-10-CM | POA: Diagnosis not present

## 2023-04-08 ENCOUNTER — Ambulatory Visit (INDEPENDENT_AMBULATORY_CARE_PROVIDER_SITE_OTHER): Payer: Medicare Other | Admitting: Podiatry

## 2023-04-08 DIAGNOSIS — N3000 Acute cystitis without hematuria: Secondary | ICD-10-CM | POA: Diagnosis not present

## 2023-04-08 DIAGNOSIS — I5032 Chronic diastolic (congestive) heart failure: Secondary | ICD-10-CM | POA: Diagnosis not present

## 2023-04-08 DIAGNOSIS — I110 Hypertensive heart disease with heart failure: Secondary | ICD-10-CM | POA: Diagnosis not present

## 2023-04-08 DIAGNOSIS — E119 Type 2 diabetes mellitus without complications: Secondary | ICD-10-CM | POA: Diagnosis not present

## 2023-04-08 DIAGNOSIS — Z794 Long term (current) use of insulin: Secondary | ICD-10-CM | POA: Diagnosis not present

## 2023-04-08 DIAGNOSIS — Z91199 Patient's noncompliance with other medical treatment and regimen due to unspecified reason: Secondary | ICD-10-CM

## 2023-04-08 DIAGNOSIS — I4892 Unspecified atrial flutter: Secondary | ICD-10-CM | POA: Diagnosis not present

## 2023-04-08 NOTE — Progress Notes (Signed)
Pt was a no show for apt CG 

## 2023-04-10 ENCOUNTER — Ambulatory Visit: Payer: Self-pay

## 2023-04-10 DIAGNOSIS — Z794 Long term (current) use of insulin: Secondary | ICD-10-CM | POA: Diagnosis not present

## 2023-04-10 DIAGNOSIS — N3000 Acute cystitis without hematuria: Secondary | ICD-10-CM | POA: Diagnosis not present

## 2023-04-10 DIAGNOSIS — I110 Hypertensive heart disease with heart failure: Secondary | ICD-10-CM | POA: Diagnosis not present

## 2023-04-10 DIAGNOSIS — I4892 Unspecified atrial flutter: Secondary | ICD-10-CM | POA: Diagnosis not present

## 2023-04-10 DIAGNOSIS — E119 Type 2 diabetes mellitus without complications: Secondary | ICD-10-CM | POA: Diagnosis not present

## 2023-04-10 DIAGNOSIS — I5032 Chronic diastolic (congestive) heart failure: Secondary | ICD-10-CM | POA: Diagnosis not present

## 2023-04-10 NOTE — Patient Outreach (Signed)
Care Coordination   Initial Visit Note   04/10/2023 Name: [name] [name] MRN: [id number] DOB: 06-Jan-1946  [name] [name] is a 77 y.o. year old female who sees Alinda Deem, MD for primary care. I spoke with  [name] by phone today.  What matters to the patients health and wellness today?  Initial assessment with patient today.  Recent inpatient admission for sever UTI and post rehab stay.  Patient reports that she takes care of her husband who has dementia.  Reports she has a caregiver from [facility] home health.  States caregiver assist with patients husband, cooks, cleans, does laundry  and runs errands. Patient reports her children live out of town.  Patient reports she is not able to drive due to braces on her legs.  Patient reports that she gets frozen meals from the senior center to assist with meals when a caregiver is not present.  Patient reports that her sister is POA. Reports she is a happy person and keeps busy on her I pad, puzzles and watching TV. Patient reports that her DM and CHF are under good control.  Reports she follows a low carb and low salt diet.   Patient reports her biggest concern about her health right now is her hemorrhoid pain and her chronic pain.  Reports that her hemorrhoids are bleeding and painful.  Reports that she uses tucks pads and showers off her bottom every time she has a bowel movement. Reports recently when she voids that she has some stool that also comes out.     Goals Addressed               This Visit's Progress     I have chronic pain and hemorrhoid pain (pt-stated)        Interventions Today    Flowsheet Row Most Recent Value  Chronic Disease   Chronic disease during today's visit Other  [Chronic pain]  General Interventions   General Interventions Discussed/Reviewed General Interventions Discussed, Labs, Annual Eye Exam, Annual Foot Exam, Durable Medical Equipment (DME), Doctor Visits  Labs Hgb A1c every 3  months  Doctor Visits Discussed/Reviewed Doctor Visits Discussed  Durable Medical Equipment (DME) BP Cuff, Glucomoter, Environmental consultant, Psychologist, forensic  Exercise Interventions   Exercise Discussed/Reviewed Exercise Discussed, Physical Activity  Physical Activity Discussed/Reviewed Home Exercise Program (HEP)  Education Interventions   Education Provided Provided Education  [Reviewed recent admission for UTI.  Review peri hygiene as patient is incontinent of stool and urine.]  Provided Verbal Education On Nutrition, Foot Care, Labs, Blood Sugar Monitoring, Exercise, Medication, When to see the doctor  Labs Reviewed Hgb A1c  Nutrition Interventions   Nutrition Discussed/Reviewed Nutrition Discussed, Carbohydrate meal planning, Decreasing salt  Pharmacy Interventions   Pharmacy Dicussed/Reviewed Medications and their functions  Safety Interventions   Safety Discussed/Reviewed Fall Risk  Advanced Directive Interventions   Advanced Directives Discussed/Reviewed Advanced Directives Discussed     Reviewed with patient that she is current self managing her chronic pain with gabapentin and tramadol as needed.   Reviewed recent hospital stay for 4-5 day for severe UTI.  Rehab at [facility] for 1 week to regain strength. Reviewed patient has a caregiver for her and her husband ( who has dementia) for 5 hours per day and limited hours on the weekend.  Confirmed patient is active with home health nurse, PT and SW.  Reviewed importance of staying hydrated to decrease concentrated urine. Reviewed importance of good DM control to prevent UTI's.  Reviewed  importance of wiping from front to back. Reviewed importance of changing pads/ depends whenever they are wet.   Provided my contact information if patient needs to call me prior to next follow up. This note sent to MD to request something for hemorrhoid pain.         SDOH assessments and interventions completed:  Yes  SDOH Interventions Today     Flowsheet Row Most Recent Value  SDOH Interventions   Food Insecurity Interventions Intervention Not Indicated  Housing Interventions Intervention Not Indicated  Transportation Interventions Intervention Not Indicated, Other (Comment)  [has a caregiver]  Utilities Interventions Intervention Not Indicated  Alcohol Usage Interventions Intervention Not Indicated (Score <7)  Financial Strain Interventions Intervention Not Indicated  Physical Activity Interventions Intervention Not Indicated, Other (Comments)  [currently active with PT]        Care Coordination Interventions:  Yes, provided   Follow up plan: Follow up call scheduled for 04/24/2023    Encounter Outcome:  Pt. Visit Completed   Rowe Pavy, RN, BSN, CEN [facility] Coordinator [phone]

## 2023-04-11 DIAGNOSIS — R531 Weakness: Secondary | ICD-10-CM | POA: Diagnosis not present

## 2023-04-11 DIAGNOSIS — E1142 Type 2 diabetes mellitus with diabetic polyneuropathy: Secondary | ICD-10-CM | POA: Diagnosis not present

## 2023-04-12 DIAGNOSIS — Z794 Long term (current) use of insulin: Secondary | ICD-10-CM | POA: Diagnosis not present

## 2023-04-12 DIAGNOSIS — E119 Type 2 diabetes mellitus without complications: Secondary | ICD-10-CM | POA: Diagnosis not present

## 2023-04-12 DIAGNOSIS — I110 Hypertensive heart disease with heart failure: Secondary | ICD-10-CM | POA: Diagnosis not present

## 2023-04-12 DIAGNOSIS — I5032 Chronic diastolic (congestive) heart failure: Secondary | ICD-10-CM | POA: Diagnosis not present

## 2023-04-12 DIAGNOSIS — N3000 Acute cystitis without hematuria: Secondary | ICD-10-CM | POA: Diagnosis not present

## 2023-04-12 DIAGNOSIS — I4892 Unspecified atrial flutter: Secondary | ICD-10-CM | POA: Diagnosis not present

## 2023-04-16 DIAGNOSIS — N3000 Acute cystitis without hematuria: Secondary | ICD-10-CM | POA: Diagnosis not present

## 2023-04-16 DIAGNOSIS — R300 Dysuria: Secondary | ICD-10-CM | POA: Diagnosis not present

## 2023-04-17 ENCOUNTER — Other Ambulatory Visit: Payer: Self-pay | Admitting: Cardiology

## 2023-04-17 DIAGNOSIS — I4892 Unspecified atrial flutter: Secondary | ICD-10-CM | POA: Diagnosis not present

## 2023-04-17 DIAGNOSIS — I5032 Chronic diastolic (congestive) heart failure: Secondary | ICD-10-CM | POA: Diagnosis not present

## 2023-04-17 DIAGNOSIS — N3000 Acute cystitis without hematuria: Secondary | ICD-10-CM | POA: Diagnosis not present

## 2023-04-17 DIAGNOSIS — E119 Type 2 diabetes mellitus without complications: Secondary | ICD-10-CM | POA: Diagnosis not present

## 2023-04-17 DIAGNOSIS — Z794 Long term (current) use of insulin: Secondary | ICD-10-CM | POA: Diagnosis not present

## 2023-04-17 DIAGNOSIS — I110 Hypertensive heart disease with heart failure: Secondary | ICD-10-CM | POA: Diagnosis not present

## 2023-04-18 DIAGNOSIS — M545 Low back pain, unspecified: Secondary | ICD-10-CM | POA: Diagnosis not present

## 2023-04-18 DIAGNOSIS — R309 Painful micturition, unspecified: Secondary | ICD-10-CM | POA: Diagnosis not present

## 2023-04-18 DIAGNOSIS — I110 Hypertensive heart disease with heart failure: Secondary | ICD-10-CM | POA: Diagnosis not present

## 2023-04-18 DIAGNOSIS — E119 Type 2 diabetes mellitus without complications: Secondary | ICD-10-CM | POA: Diagnosis not present

## 2023-04-18 DIAGNOSIS — N3000 Acute cystitis without hematuria: Secondary | ICD-10-CM | POA: Diagnosis not present

## 2023-04-18 DIAGNOSIS — Z794 Long term (current) use of insulin: Secondary | ICD-10-CM | POA: Diagnosis not present

## 2023-04-18 DIAGNOSIS — I5032 Chronic diastolic (congestive) heart failure: Secondary | ICD-10-CM | POA: Diagnosis not present

## 2023-04-18 DIAGNOSIS — I4892 Unspecified atrial flutter: Secondary | ICD-10-CM | POA: Diagnosis not present

## 2023-04-23 DIAGNOSIS — N3000 Acute cystitis without hematuria: Secondary | ICD-10-CM | POA: Diagnosis not present

## 2023-04-23 DIAGNOSIS — I252 Old myocardial infarction: Secondary | ICD-10-CM | POA: Diagnosis not present

## 2023-04-23 DIAGNOSIS — I509 Heart failure, unspecified: Secondary | ICD-10-CM | POA: Diagnosis not present

## 2023-04-23 DIAGNOSIS — I10 Essential (primary) hypertension: Secondary | ICD-10-CM | POA: Diagnosis not present

## 2023-04-23 DIAGNOSIS — E875 Hyperkalemia: Secondary | ICD-10-CM | POA: Diagnosis not present

## 2023-04-23 DIAGNOSIS — R1011 Right upper quadrant pain: Secondary | ICD-10-CM | POA: Diagnosis not present

## 2023-04-23 DIAGNOSIS — I4892 Unspecified atrial flutter: Secondary | ICD-10-CM | POA: Diagnosis not present

## 2023-04-23 DIAGNOSIS — R109 Unspecified abdominal pain: Secondary | ICD-10-CM | POA: Diagnosis not present

## 2023-04-23 DIAGNOSIS — I4891 Unspecified atrial fibrillation: Secondary | ICD-10-CM | POA: Diagnosis not present

## 2023-04-23 DIAGNOSIS — M545 Low back pain, unspecified: Secondary | ICD-10-CM | POA: Diagnosis not present

## 2023-04-23 DIAGNOSIS — N390 Urinary tract infection, site not specified: Secondary | ICD-10-CM | POA: Diagnosis not present

## 2023-04-23 DIAGNOSIS — K7460 Unspecified cirrhosis of liver: Secondary | ICD-10-CM | POA: Diagnosis not present

## 2023-04-23 DIAGNOSIS — N3281 Overactive bladder: Secondary | ICD-10-CM | POA: Diagnosis not present

## 2023-04-23 DIAGNOSIS — E119 Type 2 diabetes mellitus without complications: Secondary | ICD-10-CM | POA: Diagnosis not present

## 2023-04-23 DIAGNOSIS — Z794 Long term (current) use of insulin: Secondary | ICD-10-CM | POA: Diagnosis not present

## 2023-04-23 DIAGNOSIS — R9431 Abnormal electrocardiogram [ECG] [EKG]: Secondary | ICD-10-CM | POA: Diagnosis not present

## 2023-04-23 DIAGNOSIS — M549 Dorsalgia, unspecified: Secondary | ICD-10-CM | POA: Diagnosis not present

## 2023-04-23 DIAGNOSIS — I5032 Chronic diastolic (congestive) heart failure: Secondary | ICD-10-CM | POA: Diagnosis not present

## 2023-04-23 DIAGNOSIS — I110 Hypertensive heart disease with heart failure: Secondary | ICD-10-CM | POA: Diagnosis not present

## 2023-04-23 DIAGNOSIS — E860 Dehydration: Secondary | ICD-10-CM | POA: Diagnosis not present

## 2023-04-23 DIAGNOSIS — R3129 Other microscopic hematuria: Secondary | ICD-10-CM | POA: Diagnosis not present

## 2023-04-23 DIAGNOSIS — I510 Cardiac septal defect, acquired: Secondary | ICD-10-CM | POA: Diagnosis not present

## 2023-04-24 ENCOUNTER — Ambulatory Visit: Payer: Self-pay

## 2023-04-24 NOTE — Patient Outreach (Signed)
  Care Coordination   04/24/2023 Name: Shunteria Bish MRN: [id number] DOB: [birth year]   Care Coordination Outreach Attempts:  An unsuccessful telephone outreach was attempted for a scheduled appointment today.  Follow Up Plan:  Additional outreach attempts will be made to offer the patient care coordination information and services.   Encounter Outcome:  No Answer   Care Coordination Interventions:  No, not indicated    Rowe Pavy, RN, BSN, [facility] [phone]

## 2023-04-26 DIAGNOSIS — Z794 Long term (current) use of insulin: Secondary | ICD-10-CM | POA: Diagnosis not present

## 2023-04-26 DIAGNOSIS — I5032 Chronic diastolic (congestive) heart failure: Secondary | ICD-10-CM | POA: Diagnosis not present

## 2023-04-26 DIAGNOSIS — I110 Hypertensive heart disease with heart failure: Secondary | ICD-10-CM | POA: Diagnosis not present

## 2023-04-26 DIAGNOSIS — E119 Type 2 diabetes mellitus without complications: Secondary | ICD-10-CM | POA: Diagnosis not present

## 2023-04-26 DIAGNOSIS — I4892 Unspecified atrial flutter: Secondary | ICD-10-CM | POA: Diagnosis not present

## 2023-04-26 DIAGNOSIS — N3000 Acute cystitis without hematuria: Secondary | ICD-10-CM | POA: Diagnosis not present

## 2023-04-29 DIAGNOSIS — I5032 Chronic diastolic (congestive) heart failure: Secondary | ICD-10-CM | POA: Diagnosis not present

## 2023-04-29 DIAGNOSIS — E119 Type 2 diabetes mellitus without complications: Secondary | ICD-10-CM | POA: Diagnosis not present

## 2023-04-29 DIAGNOSIS — N3000 Acute cystitis without hematuria: Secondary | ICD-10-CM | POA: Diagnosis not present

## 2023-04-29 DIAGNOSIS — Z794 Long term (current) use of insulin: Secondary | ICD-10-CM | POA: Diagnosis not present

## 2023-04-29 DIAGNOSIS — I110 Hypertensive heart disease with heart failure: Secondary | ICD-10-CM | POA: Diagnosis not present

## 2023-04-29 DIAGNOSIS — I4892 Unspecified atrial flutter: Secondary | ICD-10-CM | POA: Diagnosis not present

## 2023-04-30 DIAGNOSIS — I4892 Unspecified atrial flutter: Secondary | ICD-10-CM | POA: Diagnosis not present

## 2023-04-30 DIAGNOSIS — Z794 Long term (current) use of insulin: Secondary | ICD-10-CM | POA: Diagnosis not present

## 2023-04-30 DIAGNOSIS — E119 Type 2 diabetes mellitus without complications: Secondary | ICD-10-CM | POA: Diagnosis not present

## 2023-04-30 DIAGNOSIS — E860 Dehydration: Secondary | ICD-10-CM | POA: Diagnosis not present

## 2023-04-30 DIAGNOSIS — E875 Hyperkalemia: Secondary | ICD-10-CM | POA: Diagnosis not present

## 2023-04-30 DIAGNOSIS — I5032 Chronic diastolic (congestive) heart failure: Secondary | ICD-10-CM | POA: Diagnosis not present

## 2023-04-30 DIAGNOSIS — I110 Hypertensive heart disease with heart failure: Secondary | ICD-10-CM | POA: Diagnosis not present

## 2023-04-30 DIAGNOSIS — N3000 Acute cystitis without hematuria: Secondary | ICD-10-CM | POA: Diagnosis not present

## 2023-04-30 DIAGNOSIS — K8080 Other cholelithiasis without obstruction: Secondary | ICD-10-CM | POA: Diagnosis not present

## 2023-05-02 DIAGNOSIS — E119 Type 2 diabetes mellitus without complications: Secondary | ICD-10-CM | POA: Diagnosis not present

## 2023-05-02 DIAGNOSIS — I4892 Unspecified atrial flutter: Secondary | ICD-10-CM | POA: Diagnosis not present

## 2023-05-02 DIAGNOSIS — I110 Hypertensive heart disease with heart failure: Secondary | ICD-10-CM | POA: Diagnosis not present

## 2023-05-02 DIAGNOSIS — E860 Dehydration: Secondary | ICD-10-CM | POA: Diagnosis not present

## 2023-05-02 DIAGNOSIS — K219 Gastro-esophageal reflux disease without esophagitis: Secondary | ICD-10-CM | POA: Diagnosis not present

## 2023-05-02 DIAGNOSIS — I2510 Atherosclerotic heart disease of native coronary artery without angina pectoris: Secondary | ICD-10-CM | POA: Diagnosis not present

## 2023-05-02 DIAGNOSIS — K7581 Nonalcoholic steatohepatitis (NASH): Secondary | ICD-10-CM | POA: Diagnosis not present

## 2023-05-02 DIAGNOSIS — E039 Hypothyroidism, unspecified: Secondary | ICD-10-CM | POA: Diagnosis not present

## 2023-05-02 DIAGNOSIS — K7460 Unspecified cirrhosis of liver: Secondary | ICD-10-CM | POA: Diagnosis not present

## 2023-05-02 DIAGNOSIS — N3000 Acute cystitis without hematuria: Secondary | ICD-10-CM | POA: Diagnosis not present

## 2023-05-02 DIAGNOSIS — F329 Major depressive disorder, single episode, unspecified: Secondary | ICD-10-CM | POA: Diagnosis not present

## 2023-05-02 DIAGNOSIS — F0280 Dementia in other diseases classified elsewhere without behavioral disturbance: Secondary | ICD-10-CM | POA: Diagnosis not present

## 2023-05-02 DIAGNOSIS — E538 Deficiency of other specified B group vitamins: Secondary | ICD-10-CM | POA: Diagnosis not present

## 2023-05-02 DIAGNOSIS — I5032 Chronic diastolic (congestive) heart failure: Secondary | ICD-10-CM | POA: Diagnosis not present

## 2023-05-02 DIAGNOSIS — D649 Anemia, unspecified: Secondary | ICD-10-CM | POA: Diagnosis not present

## 2023-05-02 DIAGNOSIS — F419 Anxiety disorder, unspecified: Secondary | ICD-10-CM | POA: Diagnosis not present

## 2023-05-02 DIAGNOSIS — Z794 Long term (current) use of insulin: Secondary | ICD-10-CM | POA: Diagnosis not present

## 2023-05-02 DIAGNOSIS — E785 Hyperlipidemia, unspecified: Secondary | ICD-10-CM | POA: Diagnosis not present

## 2023-05-02 DIAGNOSIS — Z556 Problems related to health literacy: Secondary | ICD-10-CM | POA: Diagnosis not present

## 2023-05-02 DIAGNOSIS — Z7984 Long term (current) use of oral hypoglycemic drugs: Secondary | ICD-10-CM | POA: Diagnosis not present

## 2023-05-06 DIAGNOSIS — N3000 Acute cystitis without hematuria: Secondary | ICD-10-CM | POA: Diagnosis not present

## 2023-05-06 DIAGNOSIS — I110 Hypertensive heart disease with heart failure: Secondary | ICD-10-CM | POA: Diagnosis not present

## 2023-05-06 DIAGNOSIS — I4892 Unspecified atrial flutter: Secondary | ICD-10-CM | POA: Diagnosis not present

## 2023-05-06 DIAGNOSIS — E119 Type 2 diabetes mellitus without complications: Secondary | ICD-10-CM | POA: Diagnosis not present

## 2023-05-06 DIAGNOSIS — Z794 Long term (current) use of insulin: Secondary | ICD-10-CM | POA: Diagnosis not present

## 2023-05-06 DIAGNOSIS — I5032 Chronic diastolic (congestive) heart failure: Secondary | ICD-10-CM | POA: Diagnosis not present

## 2023-05-08 ENCOUNTER — Ambulatory Visit: Payer: Self-pay

## 2023-05-08 DIAGNOSIS — I110 Hypertensive heart disease with heart failure: Secondary | ICD-10-CM | POA: Diagnosis not present

## 2023-05-08 DIAGNOSIS — I5032 Chronic diastolic (congestive) heart failure: Secondary | ICD-10-CM | POA: Diagnosis not present

## 2023-05-08 DIAGNOSIS — E119 Type 2 diabetes mellitus without complications: Secondary | ICD-10-CM | POA: Diagnosis not present

## 2023-05-08 DIAGNOSIS — Z794 Long term (current) use of insulin: Secondary | ICD-10-CM | POA: Diagnosis not present

## 2023-05-08 DIAGNOSIS — N3000 Acute cystitis without hematuria: Secondary | ICD-10-CM | POA: Diagnosis not present

## 2023-05-08 DIAGNOSIS — I4892 Unspecified atrial flutter: Secondary | ICD-10-CM | POA: Diagnosis not present

## 2023-05-08 NOTE — Patient Outreach (Signed)
  Care Coordination   Follow Up Visit Note   05/08/2023 Name: [name] [name] MRN: [id number] DOB: 1946/04/30  [name] [name] is a 77 y.o. year old female who sees Alinda Deem, MD for primary care. I spoke with  [name] by phone today.  What matters to the patients health and wellness today?  Follow up call with patient today. Patient reports that she is doing great. Reports fasting CBG of 125 today.  States she has had no falls since last telephone encounter. States no constipation.  Reports that she continues to have 1 hemorrhoid that bother her but she is using the cream that was prescribed by MD.  States that she continues to have painful urination and has an appointment with the urologist.  Reports her normal amounts of arthritis pain and she uses a heating pad.  No new concerns today.      Goals Addressed               This Visit's Progress     I have chronic pain and hemorrhoid pain (pt-stated)        Interventions Today    Flowsheet Row Most Recent Value  Chronic Disease   Chronic disease during today's visit Diabetes  General Interventions   General Interventions Discussed/Reviewed General Interventions Reviewed, Labs, Annual Eye Exam, Annual Foot Exam, Doctor Visits  Doctor Visits Discussed/Reviewed Specialist  [reviewed pending new patient appointment with urology]  Exercise Interventions   Exercise Discussed/Reviewed Physical Activity  Physical Activity Discussed/Reviewed Physical Activity Reviewed  Education Interventions   Education Provided Provided Education  [Reviewed with patient when to call MD for signs and symptoms of a UTI.]  Nutrition Interventions   Nutrition Discussed/Reviewed Nutrition Discussed, Carbohydrate meal planning  Pharmacy Interventions   Pharmacy Dicussed/Reviewed Medications and their functions  Safety Interventions   Safety Discussed/Reviewed Fall Risk       Today's Vitals   05/08/23 1153  PainSc: 5            SDOH  assessments and interventions completed:  No     Care Coordination Interventions:  Yes, provided   Follow up plan: Follow up call scheduled for 06/05/2023    Encounter Outcome:  Pt. Visit Completed   Rowe Pavy, RN, BSN, CEN [facility] Coordinator [phone]

## 2023-05-09 DIAGNOSIS — E1142 Type 2 diabetes mellitus with diabetic polyneuropathy: Secondary | ICD-10-CM | POA: Diagnosis not present

## 2023-05-09 DIAGNOSIS — E669 Obesity, unspecified: Secondary | ICD-10-CM | POA: Diagnosis not present

## 2023-05-09 DIAGNOSIS — E039 Hypothyroidism, unspecified: Secondary | ICD-10-CM | POA: Diagnosis not present

## 2023-05-09 DIAGNOSIS — Z794 Long term (current) use of insulin: Secondary | ICD-10-CM | POA: Diagnosis not present

## 2023-05-10 DIAGNOSIS — K8020 Calculus of gallbladder without cholecystitis without obstruction: Secondary | ICD-10-CM | POA: Diagnosis not present

## 2023-05-10 DIAGNOSIS — K7460 Unspecified cirrhosis of liver: Secondary | ICD-10-CM | POA: Diagnosis not present

## 2023-05-10 DIAGNOSIS — K7030 Alcoholic cirrhosis of liver without ascites: Secondary | ICD-10-CM | POA: Diagnosis not present

## 2023-05-10 DIAGNOSIS — D638 Anemia in other chronic diseases classified elsewhere: Secondary | ICD-10-CM | POA: Diagnosis not present

## 2023-05-29 ENCOUNTER — Other Ambulatory Visit (HOSPITAL_COMMUNITY)
Admission: RE | Admit: 2023-05-29 | Discharge: 2023-05-29 | Disposition: A | Discharge status: Home / Self Care | Payer: Medicare Other | Source: Other Acute Inpatient Hospital | Attending: Obstetrics and Gynecology | Admitting: Obstetrics and Gynecology

## 2023-05-29 ENCOUNTER — Ambulatory Visit (INDEPENDENT_AMBULATORY_CARE_PROVIDER_SITE_OTHER): Payer: Medicare Other | Admitting: Obstetrics and Gynecology

## 2023-05-29 ENCOUNTER — Encounter: Payer: Self-pay | Admitting: Obstetrics and Gynecology

## 2023-05-29 VITALS — BP 104/67 | Ht 61.0 in | Wt 188.0 lb

## 2023-05-29 DIAGNOSIS — R319 Hematuria, unspecified: Secondary | ICD-10-CM

## 2023-05-29 DIAGNOSIS — R350 Frequency of micturition: Secondary | ICD-10-CM | POA: Diagnosis not present

## 2023-05-29 DIAGNOSIS — N3010 Interstitial cystitis (chronic) without hematuria: Secondary | ICD-10-CM

## 2023-05-29 DIAGNOSIS — R82998 Other abnormal findings in urine: Secondary | ICD-10-CM | POA: Diagnosis not present

## 2023-05-29 DIAGNOSIS — N3944 Nocturnal enuresis: Secondary | ICD-10-CM

## 2023-05-29 LAB — URINALYSIS, ROUTINE W REFLEX MICROSCOPIC
Bilirubin Urine: NEGATIVE
Glucose, UA: NEGATIVE mg/dL
Ketones, ur: NEGATIVE mg/dL
Nitrite: NEGATIVE
Protein, ur: 100 mg/dL — AB
RBC / HPF: >50 RBC/hpf (ref 0–5)
Specific Gravity, Urine: 1.02 (ref 1.005–1.030)
WBC, UA: >50 WBC/hpf (ref 0–5)
pH: 5 (ref 5.0–8.0)

## 2023-05-29 LAB — POCT URINALYSIS DIPSTICK
Bilirubin, UA: NEGATIVE
Glucose, UA: NEGATIVE
Ketones, UA: NEGATIVE
Nitrite, UA: NEGATIVE
Protein, UA: POSITIVE — AB
Spec Grav, UA: >=1.03 — AB (ref 1.010–1.025)
Urobilinogen, UA: 0.2 E.U./dL
pH, UA: 5.5 (ref 5.0–8.0)

## 2023-05-29 MED ORDER — NITROFURANTOIN MONOHYD MACRO 100 MG PO CAPS: 100.0000 mg | ORAL_CAPSULE | Freq: Every day | ORAL | 5 refills | Status: DC

## 2023-05-29 NOTE — Patient Instructions (Addendum)
Take 2 tums prior to eating acidic meals and foods.   There is a website with helpful information for people with bladder irritation, called the IC Network at https://www.ic-network.com. This website has more information about a healthy bladder diet and patient forums for support.  The Most Bothersome Foods* The Least Bothersome Foods*  Coffee - Regular & Decaf Tea - caffeinated Carbonated beverages - cola, non-colas, diet & caffeine-free Alcohols - Beer, Red Wine, White Wine, [address] - Grapefruit, [place], Orange, [employer] - Cranberry, Grapefruit, Orange, Pineapple Vegetables - Tomato & Tomato Products Flavor Enhancers - Hot peppers, Spicy foods, Chili, Horseradish, Vinegar, Monosodium glutamate (MSG) Artificial Sweeteners - NutraSweet, Sweet 'N Low, Equal (sweetener), Saccharin Ethnic foods - [place], [place], [place] food [employer] - low-fat & whole Fruits - Bananas, Blueberries, Honeydew melon, Pears, Raisins, Watermelon Vegetables - Broccoli, [address] Sprouts, [place], Carrots, Cauliflower, [place], Cucumber, Mushrooms, Peas, Radishes, Squash, Zucchini, White potatoes, Sweet potatoes & yams Poultry - Chicken, Eggs, [place], [employer] - Beef, Diplomatic Services operational officer, Lamb Seafood - Shrimp, [place] fish, Salmon Grains - Oat, Rice Snacks - Pretzels, Popcorn  *Lenward Chancellor et al. Diet and its role in interstitial cystitis/bladder pain syndrome (IC/BPS) and comorbid conditions. [employer]. BJU Int. 2012 Jan 11.    Use the estrogen cream into the vagina as far as you can get it comfortably.   Work on decreasing bladder irritants.  Start pelvic floor PT  We will re-start antibiotics.

## 2023-05-29 NOTE — Progress Notes (Signed)
[facility] New Patient Evaluation and Consultation  Referring Provider: Alinda Deem, MD PCP: Alinda Deem, MD Date of Service: 05/29/2023  SUBJECTIVE Chief Complaint: New Patient (Initial Visit) Kavya Haag is a 77 y.o. female is here for Dysuria , recurrent UTI's, intermittent vaginal bleeding and bowel leakage.)  History of Present Illness: Ronella Plunk is a 77 y.o. White or Caucasian female seen in consultation at the request of Dr. Belva Crome for evaluation of bladder and rectal issue.    Review of records significant for: CT ABD/Pelvis in  2022 Scarring in the upper right pole of kidney. No abnormalities of the bladder.   Saw Dr. Logan Bores and was put on a prophylactic antibiotic and cysto was done -Was started on daily on Trimethoprim  Urethra - Normal Bladder - Normal mucosa, ureters normal in configuration and locationpt has significant debris in bladder and had stress incontinence  PVR 0 Flow peak 17 No obstruction  Patient tolerated procedure   Urinary Symptoms: Leaks urine with with movement to the bathroom, with urgency, and while asleep Leaks 4 time(s) per days.  Pad use: 4 adult diapers per day.   She is bothered by her UI symptoms.  Day time voids 6.  Nocturia: 0 times per night to void. Voiding dysfunction: she does not empty her bladder well.  does not use a catheter to empty bladder.  When urinating, she feels the need to urinate multiple times in a row Drinks: 16oz Coffee,  2-5 cans of Diet Pepsi and Zero Sugar Dr. Reino Kent, Water per day  UTIs: 6 UTI's in the last year.   Reports history of blood in urine  Pelvic Organ Prolapse Symptoms:                  She Denies a feeling of a bulge the vaginal area.    Bowel Symptom: Bowel movements: 1 time(s) per day Stool consistency: soft  or loose Straining: no.  Splinting: no.  Incomplete evacuation: no.  She Admits to accidental bowel leakage / fecal incontinence  Occurs: 1 time(s) per  day  Consistency with leakage: soft  Bowel regimen: none Last colonoscopy: Date 2020, Results WNL  Sexual Function Sexually active: no.  Sexual orientation: Straight Pain with sex: No  Pelvic Pain Denies pelvic pain    Past Medical History:  Past Medical History:  Diagnosis Date   Allergic rhinitis    Anxiety    ANXIETY 12/26/2007   Qualifier: Diagnosis of  By: Yetta Barre CNA/MA, Jessica     Arthritis    Bronchitis    Chest pain in adult 12/24/2018   CHF (congestive heart failure) (HCC)    CHF, MILD 12/26/2007   Qualifier: Diagnosis of  By: Yetta Barre CNA/MA, Jessica     Chronic atrial fibrillation (HCC) 04/24/2016   Watchman atrial appendage device [facility] at [facility] for clot pevention   Chronic diastolic heart failure (HCC)    Chronic liver disease 03/15/2016   Closed fracture of proximal tibia 03/13/2018   Complication of anesthesia    low blood pressure once   Depression    Enlarged heart    Esophageal reflux 07/04/2017   Essential hypertension 12/26/2007   Qualifier: Diagnosis of  By: Yetta Barre CNA/MA, Shanda Bumps     Fibromyalgia    GERD (gastroesophageal reflux disease)    HTN (hypertension)    on medication since age 73   Hypercholesteremia    Hyperlipidemia 02/29/2016   Hypertensive heart disease    on medication since age 89   Hypothyroidism  03/20/2016   Idiopathic cirrhosis (HCC)    stage 4; sees Dr. 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 No obstruction, no response to dilator    Sleep apnea    Somnolence     SOMNOLENCE 12/26/2007   Annotation: excessive daytime Qualifier: Diagnosis of  By: Yetta Barre CNA/MA, Jessica     Splenomegaly    Swelling of joint, knee, right 01/30/2018   Type 2 diabetes mellitus (HCC) 12/29/2007   Qualifier: Diagnosis of  By: Maple Hudson MD, Clinton D    Type 2 diabetes mellitus without complications (HCC)    Type II or unspecified type diabetes mellitus without mention of complication, not stated as uncontrolled    Urinary incontinence, nocturnal enuresis    Urinary, incontinence, stress female    wears depends     Past Surgical History:   Past Surgical History:  Procedure Laterality Date   ABDOMINAL HYSTERECTOMY     APPENDECTOMY     BACK SURGERY     CARPAL TUNNEL RELEASE     bilaterally   CATARACT EXTRACTION W/ INTRAOCULAR LENS  IMPLANT, BILATERAL     DILATION AND CURETTAGE OF UTERUS     EYE SURGERY     cataract ext/ iol implants   KNEE ARTHROSCOPY     LUMBAR LAMINECTOMY  03/2011; 01/2012   MOUTH SURGERY     SPLENECTOMY     TONSILLECTOMY AND ADENOIDECTOMY     TOTAL ABDOMINAL HYSTERECTOMY       Past OB/GYN History: G2 P2 Vaginal deliveries: 2,  Forceps/ Vacuum deliveries: 1, Cesarean section: 0 Menopausal: Yes, at age 69   Medications: She has a current medication list which includes the following prescription(s): amlodipine, bupropion, carvedilol, doxazosin, furosemide, gabapentin, irbesartan, isosorbide mononitrate, levothyroxine, metformin, nitrofurantoin (macrocrystal-monohydrate), nitrostat, omeprazole, potassium chloride sa, rosuvastatin, venlafaxine xr, vitamin d (ergocalciferol), and tramadol.   Allergies: Patient is allergic to azithromycin, cefuroxime axetil, celecoxib, codeine, hydralazine hcl, sulfa antibiotics, amlodipine, canagliflozin, cefuroxime, exenatide, hydralazine hcl, other, and norvasc [amlodipine besylate].   Social History:  Social History   Tobacco Use   Smoking status: Never   Smokeless tobacco: Never  Vaping Use   Vaping Use: Never  used  Substance Use Topics   Alcohol use: No   Drug use: No    Relationship status: married She lives with husband.   She is not employed. Regular exercise: No History of abuse: No  Family History:   Family History  Problem Relation Age of Onset   Emphysema Mother    Rheum arthritis Mother    Cancer Mother        uterine, cervical, vaginal   Anesthesia problems Mother    Heart attack Father    Stroke Father    CAD Father    Hypertension Father    Lupus Sister    Diabetes Paternal Grandmother      Review of Systems: Review of Systems  Constitutional:  Positive for malaise/fatigue. Negative for chills and fever.  Respiratory:  Positive  for cough (at night) and shortness of breath. Negative for wheezing.   Cardiovascular:  Negative for chest pain, palpitations and leg swelling.  Gastrointestinal:  Negative for abdominal pain and blood in stool.  Genitourinary:  Positive for dysuria and hematuria.  Neurological:  Negative for dizziness, weakness and headaches.  Endo/Heme/Allergies:  Bruises/bleeds easily.  Psychiatric/Behavioral:  Positive for depression. Negative for suicidal ideas. The patient is not nervous/anxious.      OBJECTIVE Physical Exam: Vitals:   05/29/23 1419  BP: 104/67  Weight: 188 lb (85.3 kg)  Height: 5\' 1"  (1.549 m)    Physical Exam Constitutional:      General: She is not in acute distress.    Appearance: Normal appearance. She is not ill-appearing or toxic-appearing.  Pulmonary:     Effort: Pulmonary effort is normal.  Skin:    General: Skin is warm and dry.  Neurological:     Mental Status: She is alert and oriented to person, place, and time.  Psychiatric:        Mood and Affect: Mood normal.        Behavior: Behavior normal.        Thought Content: Thought content normal.        Judgment: Judgment normal.      GU / Detailed Urogynecologic Evaluation:  Pelvic Exam: Normal external female genitalia; Bartholin's and Skene's glands  normal in appearance; urethral meatus normal in appearance, no urethral masses or discharge.   CST: negative  s/p hysterectomy: Speculum exam reveals normal vaginal mucosa with  atrophy and normal vaginal cuff.  Adnexa normal adnexa.    With apex supported, anterior compartment defect was reduced  Pelvic floor strength II/V  Pelvic floor musculature: Right levator non-tender, Right obturator non-tender, Left levator tender, Left obturator tender  POP-Q:   POP-Q  -2.5                                            Aa   -2.5                                           Ba  -7                                              C   2.5                                            Gh  5                                            Pb  8.5                                            tvl   -3  Ap  -3                                            Bp                                                 D      Rectal Exam:  Normal sphincter tone, no distal rectocele, enterocoele not present, no rectal masses, noted dyssynergia when asking the patient to bear down.  Post-Void Residual (PVR) by Bladder Scan: In order to evaluate bladder emptying, we discussed obtaining a postvoid residual and she agreed to this procedure.  Procedure: The ultrasound unit was placed on the patient's abdomen in the suprapubic region after the patient had voided. A PVR of 43 ml was obtained by bladder scan.  Laboratory Results: POC: Large blood, +Protein, +Mucous, Small leukocytes  ASSESSMENT AND PLAN Ms. Hirota is a 77 y.o. with:  1. Bladder pain syndrome   2. Bed wetting   3. Urinary frequency   4. Leukocytes in urine   5. Hematuria, unspecified type    Patient is most concerned about having bladder pain every time she urinates. She has  not been officially diagnosed with IC but her previous 2 urine samples have been positive for blood. She was placed on prophylactic  antibiotics by the urologist but stopped taking them. She is interested in re-starting prophylaxis, will start back on 6 month course of Macrobid.  For patient's bed wetting, we will start pelvic floor PT. If we can assist patient in controlling her bladder symptoms and reduce her bed wetting, it would go a long way for her quality of life. Referral placed for pelvic floor PT.  Patient has symptoms of urinary urgency. We discussed decreasing bladder irritants as she is currently drinking large amounts of coffee and soda daily. She will work on this and has been given the list of most/least bothersome foods for the bladder. May consider adding a medication at next visit.  Patient has positive leukocytes in urine today. Will send for culture and treat with antibiotics if necessary.  POC urine positive for blood. Will send for micro. If negative for UTI and positive for blood, may need to order [place] of kidneys. Patient has had  a cystoscopy done by Dr. Logan Bores but has not had recent kidney imaging. Last imagining with kidney visualization was in 2022. Will decide if imaging needed after Micro and culture.    Patient to return in 2 months after she has done some PT.    Selmer Dominion, NP

## 2023-05-30 LAB — URINE CULTURE

## 2023-06-03 DIAGNOSIS — E7800 Pure hypercholesterolemia, unspecified: Secondary | ICD-10-CM | POA: Diagnosis not present

## 2023-06-03 DIAGNOSIS — M542 Cervicalgia: Secondary | ICD-10-CM | POA: Diagnosis not present

## 2023-06-03 DIAGNOSIS — E669 Obesity, unspecified: Secondary | ICD-10-CM | POA: Diagnosis not present

## 2023-06-03 DIAGNOSIS — I482 Chronic atrial fibrillation, unspecified: Secondary | ICD-10-CM | POA: Diagnosis not present

## 2023-06-03 DIAGNOSIS — Z6834 Body mass index (BMI) 34.0-34.9, adult: Secondary | ICD-10-CM | POA: Diagnosis not present

## 2023-06-03 DIAGNOSIS — E039 Hypothyroidism, unspecified: Secondary | ICD-10-CM | POA: Diagnosis not present

## 2023-06-03 DIAGNOSIS — I10 Essential (primary) hypertension: Secondary | ICD-10-CM | POA: Diagnosis not present

## 2023-06-03 DIAGNOSIS — E1142 Type 2 diabetes mellitus with diabetic polyneuropathy: Secondary | ICD-10-CM | POA: Diagnosis not present

## 2023-06-05 ENCOUNTER — Ambulatory Visit: Payer: Self-pay

## 2023-06-05 DIAGNOSIS — D638 Anemia in other chronic diseases classified elsewhere: Secondary | ICD-10-CM | POA: Diagnosis not present

## 2023-06-05 DIAGNOSIS — K8020 Calculus of gallbladder without cholecystitis without obstruction: Secondary | ICD-10-CM | POA: Diagnosis not present

## 2023-06-05 DIAGNOSIS — K7460 Unspecified cirrhosis of liver: Secondary | ICD-10-CM | POA: Diagnosis not present

## 2023-06-05 DIAGNOSIS — K219 Gastro-esophageal reflux disease without esophagitis: Secondary | ICD-10-CM | POA: Diagnosis not present

## 2023-06-05 NOTE — Patient Outreach (Signed)
  Care Coordination   Follow Up Visit Note   06/05/2023 Name: [name] [name] MRN: [id number] DOB: Apr 05, 1946  [name] [name] is a 77 y.o. year old female who sees [name] Deem, MD for primary care. I spoke with  [name] [name] by phone today.  What matters to the patients health and wellness today?  Placed follow up call to patient today who states that she pulled a muscle in her shoulder. She thinks that this is related to how she slept. Reports that she saw [name] [name] and she RX muscle relaxer.     No relief. Using a heating pad.  No falls.  Reports hemorrhoid is doing ok.   Continues to use her cream.   Continues to have chronic pain.  Denies any changes to medications.    Goals Addressed               This Visit's Progress     I have chronic pain and hemorrhoid pain (pt-stated)          Interventions Today    Flowsheet Row Most Recent Value  Chronic Disease   Chronic disease during today's visit Other  [Chronic pain]  General Interventions   General Interventions Discussed/Reviewed General Interventions Reviewed, Doctor Visits  Doctor Visits Discussed/Reviewed Doctor Visits Discussed  [reviewed MD visit from earlier this week]  Education Interventions   Education Provided Provided Education  [reviewed with patient if symptoms continue to call MD back. Encouraged patient to start monitoring her CBG as per MD request.]  Provided Verbal Education On Foot Care, Labs, Blood Sugar Monitoring  Nutrition Interventions   Nutrition Discussed/Reviewed Nutrition Discussed, Carbohydrate meal planning  Pharmacy Interventions   Pharmacy Dicussed/Reviewed Medications and their functions       Today's Vitals   06/05/23 1132  PainSc: 8            SDOH assessments and interventions completed:  No     Care Coordination Interventions:  Yes, provided   Follow up plan: Follow up call scheduled for 08/05/2023    Encounter Outcome:  Pt. Visit Completed

## 2023-06-21 ENCOUNTER — Encounter: Payer: Self-pay | Admitting: Podiatry

## 2023-06-21 ENCOUNTER — Ambulatory Visit (INDEPENDENT_AMBULATORY_CARE_PROVIDER_SITE_OTHER): Payer: Medicare Other | Admitting: Podiatry

## 2023-06-21 DIAGNOSIS — E1142 Type 2 diabetes mellitus with diabetic polyneuropathy: Secondary | ICD-10-CM | POA: Diagnosis not present

## 2023-06-21 DIAGNOSIS — M79674 Pain in right toe(s): Secondary | ICD-10-CM | POA: Diagnosis not present

## 2023-06-21 DIAGNOSIS — M21372 Foot drop, left foot: Secondary | ICD-10-CM

## 2023-06-21 DIAGNOSIS — B351 Tinea unguium: Secondary | ICD-10-CM

## 2023-06-21 DIAGNOSIS — M79675 Pain in left toe(s): Secondary | ICD-10-CM | POA: Diagnosis not present

## 2023-06-21 DIAGNOSIS — M21371 Foot drop, right foot: Secondary | ICD-10-CM

## 2023-06-21 DIAGNOSIS — M2042 Other hammer toe(s) (acquired), left foot: Secondary | ICD-10-CM

## 2023-06-21 DIAGNOSIS — M2041 Other hammer toe(s) (acquired), right foot: Secondary | ICD-10-CM

## 2023-06-21 NOTE — Progress Notes (Signed)
This patient returns to my office for at risk foot care.  This patient requires this care by a professional since this patient will be at risk due to having diabetic neuropathy. This patient is unable to cut nails himself since the patient cannot reach his nails.These nails are painful walking and wearing shoes.  This patient presents for at risk foot care today.  General Appearance  Alert, conversant and in no acute stress.  Vascular  Dorsalis pedis and posterior tibial  pulses are palpable  bilaterally.  Capillary return is within normal limits  bilaterally. Temperature is within normal limits  bilaterally.  Neurologic  Senn-Weinstein monofilament wire test absent  bilaterally. Muscle power within normal limits bilaterally.  Nails Thick disfigured discolored nails with subungual debris  from hallux to fifth toes bilaterally. No evidence of bacterial infection or drainage bilaterally.  Orthopedic  No limitations of motion  feet .  No crepitus or effusions noted.  No bony pathology or digital deformities noted. B/L foot drop.  HAV  B/L.  Hammer toe 2-5 left foot.  Skin  normotropic skin with no porokeratosis noted bilaterally.  No signs of infections or ulcers noted.     Onychomycosis  Pain in right toes  Pain in left toes  Consent was obtained for treatment procedures.   Mechanical debridement of nails 1-5  bilaterally performed with a nail nipper.  Filed with dremel without incident.    Return office visit                     Told patient to return for periodic foot care and evaluation due to potential at risk complications.   Helane Gunther DPM

## 2023-07-03 ENCOUNTER — Other Ambulatory Visit: Payer: Self-pay | Admitting: Cardiology

## 2023-07-24 ENCOUNTER — Other Ambulatory Visit: Payer: Self-pay | Admitting: Cardiology

## 2023-07-30 ENCOUNTER — Ambulatory Visit (INDEPENDENT_AMBULATORY_CARE_PROVIDER_SITE_OTHER): Payer: Medicare Other | Admitting: Obstetrics and Gynecology

## 2023-07-30 ENCOUNTER — Other Ambulatory Visit (HOSPITAL_COMMUNITY)
Admission: RE | Admit: 2023-07-30 | Discharge: 2023-07-30 | Disposition: A | Discharge status: Home / Self Care | Payer: Medicare Other | Source: Other Acute Inpatient Hospital | Attending: Obstetrics and Gynecology | Admitting: Obstetrics and Gynecology

## 2023-07-30 ENCOUNTER — Other Ambulatory Visit: Payer: Self-pay

## 2023-07-30 ENCOUNTER — Encounter: Payer: Self-pay | Admitting: Obstetrics and Gynecology

## 2023-07-30 ENCOUNTER — Ambulatory Visit: Payer: Medicare Other | Attending: Obstetrics and Gynecology | Admitting: Physical Therapy

## 2023-07-30 VITALS — BP 105/65 | HR 73

## 2023-07-30 DIAGNOSIS — R319 Hematuria, unspecified: Secondary | ICD-10-CM | POA: Diagnosis not present

## 2023-07-30 DIAGNOSIS — R279 Unspecified lack of coordination: Secondary | ICD-10-CM

## 2023-07-30 DIAGNOSIS — R350 Frequency of micturition: Secondary | ICD-10-CM

## 2023-07-30 DIAGNOSIS — R293 Abnormal posture: Secondary | ICD-10-CM | POA: Diagnosis not present

## 2023-07-30 DIAGNOSIS — R278 Other lack of coordination: Secondary | ICD-10-CM | POA: Insufficient documentation

## 2023-07-30 DIAGNOSIS — N3010 Interstitial cystitis (chronic) without hematuria: Secondary | ICD-10-CM | POA: Diagnosis not present

## 2023-07-30 DIAGNOSIS — R3989 Other symptoms and signs involving the genitourinary system: Secondary | ICD-10-CM

## 2023-07-30 DIAGNOSIS — M6281 Muscle weakness (generalized): Secondary | ICD-10-CM | POA: Insufficient documentation

## 2023-07-30 DIAGNOSIS — M62838 Other muscle spasm: Secondary | ICD-10-CM | POA: Diagnosis not present

## 2023-07-30 DIAGNOSIS — R2689 Other abnormalities of gait and mobility: Secondary | ICD-10-CM | POA: Insufficient documentation

## 2023-07-30 DIAGNOSIS — N952 Postmenopausal atrophic vaginitis: Secondary | ICD-10-CM | POA: Diagnosis not present

## 2023-07-30 DIAGNOSIS — N3944 Nocturnal enuresis: Secondary | ICD-10-CM | POA: Diagnosis not present

## 2023-07-30 DIAGNOSIS — R269 Unspecified abnormalities of gait and mobility: Secondary | ICD-10-CM

## 2023-07-30 LAB — POCT URINALYSIS DIPSTICK
Bilirubin, UA: POSITIVE
Glucose, UA: NEGATIVE
Nitrite, UA: NEGATIVE
Protein, UA: POSITIVE — AB
Spec Grav, UA: <=1.005 — AB (ref 1.010–1.025)
Urobilinogen, UA: 0.2 U/dL
pH, UA: 5 (ref 5.0–8.0)

## 2023-07-30 MED ORDER — SODIUM BICARBONATE 8.4 % IV SOLN
5.0000 mL | Freq: Once | INTRAVENOUS | Status: AC
  Administered 2023-07-30: 5 mL

## 2023-07-30 MED ORDER — BUPIVACAINE HCL 0.25 % IJ SOLN
20.0000 mL | Freq: Once | INTRAMUSCULAR | Status: AC
  Administered 2023-07-30: 20 mL

## 2023-07-30 MED ORDER — HEPARIN SODIUM (PORCINE) 10000 UNIT/ML IJ SOLN
10000.0000 [IU] | Freq: Once | INTRAMUSCULAR | Status: AC
  Administered 2023-07-30: 10000 [IU] via INTRAVESICAL

## 2023-07-30 MED ORDER — ESTRADIOL 0.1 MG/GM VA CREA: 0.5000 g | TOPICAL_CREAM | VAGINAL | 11 refills | Status: DC

## 2023-07-30 MED ORDER — VIBEGRON 75 MG PO TABS: 75.0000 mg | ORAL_TABLET | Freq: Every day | ORAL | 2 refills | Status: DC

## 2023-07-30 MED ORDER — LIDOCAINE HCL 2 % IJ SOLN
20.0000 mL | Freq: Once | INTRAMUSCULAR | Status: AC
  Administered 2023-07-30: 400 mg

## 2023-07-30 NOTE — Progress Notes (Signed)
[facility] Return Visit  SUBJECTIVE  History of Present Illness: [name] [name] is a 77 y.o. female seen in follow-up for bladder pain, OAB, and hematuria. Plan at last visit was start physical therapy and work on decreasing bladder irritants.  Patient reports she has had increasing bladder pain and it burns to pee. She reports she used samples of the estrogen cream and it seemed to help some of her irritation but not inside the bladder. She reports the burning is not in the vagina but in the bladder itself.      Past Medical History: Patient  has a past medical history of Allergic rhinitis, Anxiety, ANXIETY (12/26/2007), Arthritis, Bronchitis, Chest pain in adult (12/24/2018), CHF (congestive heart failure) (HCC), CHF, MILD (12/26/2007), Chronic atrial fibrillation (HCC) (04/24/2016), Chronic diastolic heart failure (HCC), Chronic liver disease (03/15/2016), Closed fracture of proximal tibia (03/13/2018), Complication of anesthesia, Depression, Enlarged heart, Esophageal reflux (07/04/2017), Essential hypertension (12/26/2007), Fibromyalgia, GERD (gastroesophageal reflux disease), HTN (hypertension), Hypercholesteremia, Hyperlipidemia (02/29/2016), Hypertensive heart disease, Hypothyroidism (03/20/2016), Idiopathic cirrhosis (HCC), Mild CAD (02/29/2016), Myocardial infarction (HCC), Neuropathy, peripheral, OSA (obstructive sleep apnea), OSA on CPAP (07/04/2008), Presence of Watchman left atrial appendage closure device (01/30/2018), Recurrent UTI (08/04/2021), Seasonal and perennial allergic rhinitis (01/23/2011), Simple chronic bronchitis (HCC) (12/26/2007), Sleep apnea, Somnolence, SOMNOLENCE (12/26/2007), Splenomegaly, Swelling of joint, knee, right (01/30/2018), Type 2 diabetes mellitus (HCC) (12/29/2007), Type 2 diabetes mellitus without complications (HCC), Type II or unspecified type diabetes mellitus without mention of complication, not stated as uncontrolled, Urinary incontinence, nocturnal enuresis, and  Urinary, incontinence, stress female.   Past Surgical History: She  has a past surgical history that includes Mouth surgery; Carpal tunnel release; Total abdominal hysterectomy; Appendectomy; Knee arthroscopy; Eye surgery; Back surgery; Lumbar laminectomy (03/2011; 01/2012); Tonsillectomy and adenoidectomy; Dilation and curettage of uterus; Cataract extraction w/ intraocular lens  implant, bilateral; Abdominal hysterectomy; and Splenectomy.   Medications: She has a current medication list which includes the following prescription(s): amlodipine, bupropion, carvedilol, doxazosin, [START ON 08/01/2023] estradiol, furosemide, gabapentin, irbesartan, isosorbide mononitrate, levothyroxine, metformin, nitrofurantoin (macrocrystal-monohydrate), nitrostat, omeprazole, potassium chloride sa, rosuvastatin, tramadol, venlafaxine xr, vibegron, and vitamin d (ergocalciferol).   Allergies: Patient is allergic to azithromycin, cefuroxime axetil, celecoxib, codeine, hydralazine hcl, sulfa antibiotics, amlodipine, canagliflozin, cefuroxime, exenatide, hydralazine hcl, other, and norvasc [amlodipine besylate].   Social History: Patient  reports that she has never smoked. She has never used smokeless tobacco. She reports that she does not drink alcohol and does not use drugs.      OBJECTIVE  Bladder Instillation: The patient was identified and verbally consented for the procedure.  The urethra was prepped with Betadine x 3. A 16 Fr foley catheter was inserted the bladder and drained for 40cc. The foley was then attached to a 60 mL syringe with the plunger removed.  The medication was slowly poured into the bladder via the syringe and foley.  The medication consisted of: 20ml of Lidocaine 2%, 20mL of Bupivicaine 0.25%, 10,000 units/mL Heparin, 5mL Sodium Bicarbonate 8.4%.  The foley was removed and the patient was asked to hold the liquids in her bladder for 30-60 minutes if possible.   Precautions were given and  patient was instructed to call the office or on-call number for any concerns.   Physical Exam: Vitals:   07/30/23 1410  BP: 105/65  Pulse: 73   POC Urine: Positive for protein, moderate leukocytes, large blood, large ketones, and positive bilirubin.   Gen: No apparent distress, A&O x 3.  Detailed Urogynecologic Evaluation:  Deferred.    ASSESSMENT AND PLAN    Ms. Chhay is a 77 y.o. with:  1. Hematuria, unspecified type   2. Urinary frequency   3. Bladder pain syndrome   4. Vaginal atrophy   5. Urethral pain    Patient has a history of scarring to the ureter/kidney. Concern for possible stones. Will send for renal ultrasound. If no sign of stones will encourage cystoscopy. Last cystoscopy done in 2022.  Will send urine for culture today, but very similar to last urine and do not feel strongly that it is infection.  Patient reports she has been told previously that she may have IC or bladder pain syndrome. She continues to have pain in the bladder region. She would like to do a bladder installation today and see if this is helpful. Bladder installation done, see above note.  Patient has vaginal atrophy on exam. Encouraged her to use the estrogen cream x2 weekly. New prescription sent to pharmacy.  Patient also reports some mild urethral pain. She plans to work with pelvic floor PT and had her initial evaluation today.   Will tentatively plan for bladder installations moving forward, will call later in the week and see how she has tolerated this and if she has had some improvement.

## 2023-07-30 NOTE — Patient Instructions (Signed)

## 2023-07-30 NOTE — Patient Instructions (Addendum)
Use the vaginal estrogen cream x2 weekly.   Start the Basye 75mg  daily at [employer] [facility].   I will call you on Friday to see if the medicine helped your bladder pain.   For the pain in the bladder we have done a bladder installation. We often will do 4-6 of these once a week for 4-6 weeks to help with bladder pain in the setting of interstitial cystitis.   For the blood in the urine I am sending you for an Ultrasound of your kidneys. I have ordered it for [facility] as that is close to your physical therapy place.   [address], [place], [place] [address]

## 2023-07-30 NOTE — Therapy (Addendum)
OUTPATIENT PHYSICAL THERAPY FEMALE PELVIC EVALUATION   Patient Name: [name] [name] MRN: [id number] DOB:1946/01/22, 77 y.o., female Today's Date: 07/30/2023  END OF SESSION:  PT End of Session - 07/30/23 1225     Visit Number 1    Date for PT Re-Evaluation 11/29/23    Authorization Type MCR    PT Start Time 1224    PT Stop Time 1305    PT Time Calculation (min) 41 min    Activity Tolerance Patient tolerated treatment well    Behavior During Therapy [facility] for tasks assessed/performed             Past Medical History:  Diagnosis Date   Allergic rhinitis    Anxiety    ANXIETY 12/26/2007   Qualifier: Diagnosis of  By: Yetta Barre CNA/MA, Jessica     Arthritis    Bronchitis    Chest pain in adult 12/24/2018   CHF (congestive heart failure) (HCC)    CHF, MILD 12/26/2007   Qualifier: Diagnosis of  By: Yetta Barre CNA/MA, Jessica     Chronic atrial fibrillation (HCC) 04/24/2016   Watchman atrial appendage device placed at [facility] for clot pevention   Chronic diastolic heart failure (HCC)    Chronic liver disease 03/15/2016   Closed fracture of proximal tibia 03/13/2018   Complication of anesthesia    low blood pressure once   Depression    Enlarged heart    Esophageal reflux 07/04/2017   Essential hypertension 12/26/2007   Qualifier: Diagnosis of  By: Yetta Barre CNA/MA, Shanda Bumps     Fibromyalgia    GERD (gastroesophageal reflux disease)    HTN (hypertension)    on medication since age 17   Hypercholesteremia    Hyperlipidemia 02/29/2016   Hypertensive heart disease    on medication since age 35   Hypothyroidism 03/20/2016   Idiopathic cirrhosis (HCC)    stage 4; sees Dr. Charm Barges in [place]   Mild CAD 02/29/2016   Myocardial infarction [facility])    age 77   Neuropathy, peripheral    lower extremities   OSA (obstructive sleep apnea)    OSA on CPAP 07/04/2008   CPAP [facility]/ [facility]    Presence of Watchman left atrial appendage closure device 01/30/2018   Recurrent UTI 08/04/2021   Seasonal and perennial  allergic rhinitis 01/23/2011   Allergy vaccine restarted at 1:50 08/02/2011 [facility], DC'd 2016    Simple chronic bronchitis (HCC) 12/26/2007   PFT 07/26/09-mild restriction and reduction of diffusion. No obstruction, no response to dilator    Sleep apnea    Somnolence    SOMNOLENCE 12/26/2007   Annotation: excessive daytime Qualifier: Diagnosis of  By: Yetta Barre CNA/MA, Jessica     Splenomegaly    Swelling of joint, knee, right 01/30/2018   Type 2 diabetes mellitus (HCC) 12/29/2007   Qualifier: Diagnosis of  By: Maple Hudson MD, Clinton D    Type 2 diabetes mellitus without complications (HCC)    Type II or unspecified type diabetes mellitus without mention of complication, not stated as uncontrolled    Urinary incontinence, nocturnal enuresis    Urinary, incontinence, stress female    wears depends   Past Surgical History:  Procedure Laterality Date   ABDOMINAL HYSTERECTOMY     APPENDECTOMY     BACK SURGERY     CARPAL TUNNEL RELEASE     bilaterally   CATARACT EXTRACTION W/ INTRAOCULAR LENS  IMPLANT, BILATERAL     DILATION AND CURETTAGE OF UTERUS     EYE SURGERY  cataract ext/ iol implants   KNEE ARTHROSCOPY     LUMBAR LAMINECTOMY  03/2011; 01/2012   MOUTH SURGERY     SPLENECTOMY     TONSILLECTOMY AND ADENOIDECTOMY     TOTAL ABDOMINAL HYSTERECTOMY     Patient Active Problem List   Diagnosis Date Noted   Recurrent UTI 08/04/2021   Enlarged heart 07/27/2021   Morbid obesity (HCC) 05/17/2021   Polyneuropathy due to type 2 diabetes mellitus (HCC) 05/17/2021   Bleeding skin mole 05/07/2021   Sleep apnea    HTN (hypertension)    CHF (congestive heart failure) (HCC)    Bronchitis    Chest pain in adult 12/24/2018   Closed fracture of proximal tibia 03/13/2018   Swelling of joint, knee, right 01/30/2018   Presence of Watchman left atrial appendage closure device 01/30/2018   Esophageal reflux 07/04/2017   Urinary, incontinence, stress female    Urinary incontinence, nocturnal enuresis    Type 2  diabetes mellitus without complications (HCC)    Splenomegaly    Somnolence    OSA (obstructive sleep apnea)    Neuropathy, peripheral    Myocardial infarction (HCC)    Idiopathic cirrhosis (HCC)    Hypercholesteremia    Hypertensive heart disease    GERD (gastroesophageal reflux disease)    Fibromyalgia    Coronary artery disease    Complication of anesthesia    Chronic diastolic heart failure (HCC)    Arthritis    Anxiety    Chronic atrial fibrillation (HCC) 04/24/2016   Hypothyroidism 03/20/2016   Chronic liver disease 03/15/2016   Hyperlipidemia 02/29/2016   Mild CAD 02/29/2016   Seasonal and perennial allergic rhinitis 01/23/2011   Type 2 diabetes mellitus (HCC) 12/29/2007   ANXIETY 12/26/2007   Depression 12/26/2007   Essential hypertension 12/26/2007   CHF, MILD 12/26/2007   Simple chronic bronchitis (HCC) 12/26/2007   SOMNOLENCE 12/26/2007    PCP: Alinda Deem, MD  REFERRING PROVIDER: Selmer Dominion, NP   REFERRING DIAG: N30.10 (ICD-10-CM) - Bladder pain syndrome N39.44 (ICD-10-CM) - Bed wetting  THERAPY DIAG:  Muscle weakness (generalized)  Unspecified lack of coordination  Abnormal posture  Other muscle spasm  Abnormality of gait and mobility  Rationale for Evaluation and Treatment: Rehabilitation  ONSET DATE: 1.5 year  SUBJECTIVE:                                                                                                                                                                                           SUBJECTIVE STATEMENT: Only has pain with urination, no pain after urination. Increased urine frequency in evening and start of sleep. Has leakage  with bed wetting sleeping through urges.   Fluid intake: Yes: water- 16oz/hourly;     PAIN:  Are you having pain? Yes NPRS scale: 10/10 Pain location:  bladder  Pain type: burning Pain description: intermittent   Aggravating factors: urinating Relieving factors: finishing  urinating  PRECAUTIONS: fibromyalgia   RED FLAGS: None   WEIGHT BEARING RESTRICTIONS: No  FALLS:  Has patient fallen in last 6 months? No  LIVING ENVIRONMENT: Lives with: lives with their family Lives in: House/apartment Has following equipment at home: Environmental consultant - 4 wheeled  OCCUPATION: retired   PLOF: Independent with household mobility without device and Needs assistance with homemaking  PATIENT GOALS: to have less pain  PERTINENT HISTORY:  Type 2 diabetes mellitus, hypertension, anxiety, fibromyalgia, MI, cirrhosis, CHF, obesity, UTIs, total hysterectomy, splenectomy, lumbar laminectomy,  Sexual abuse: No  BOWEL MOVEMENT: Pain with bowel movement: No Type of bowel movement:Type (Bristol Stool Scale) 7-6, Frequency daily, and Strain No Fully empty rectum: No Leakage: Yes: sometimes after a bowel movement  Pads: Yes:   Fiber supplement: No  URINATION: Pain with urination: Yes Fully empty bladder: No Stream: Strong and Weak Urgency: Yes:   Frequency: about 2 hours; 3 times as with first sleeping but then sleeps through the night Leakage:  bed wetting Pads: Yes: diapers at night  INTERCOURSE: Pain with intercourse:  not active   PREGNANCY: Vaginal deliveries 2 Tearing episiotomy x2 C-section deliveries 0 Currently pregnant No  PROLAPSE: None   OBJECTIVE:   DIAGNOSTIC FINDINGS:    COGNITION: Overall cognitive status: Within functional limits for tasks assessed     SENSATION: Light touch: Appears intact Proprioception: Appears intact  MUSCLE LENGTH: Bil hamstring and adductors limited by 50%   FUNCTIONAL TESTS:  Unable to complete functional squat  Tinetti - 19 (moderate fall risk) GAIT: Distance walked: 150'  Assistive device utilized: Environmental consultant - 4 wheeled Level of assistance: SBA Comments: flexed posture, decreased stride length, decreased step height, decreased cadence  POSTURE: rounded shoulders, forward head, increased thoracic  kyphosis, and posterior pelvic tilt    LUMBARAROM/PROM:  A/PROM A/PROM  eval  Flexion Limited by 50%  Extension Limited by 25%  Right lateral flexion Limited by 50%  Left lateral flexion Limited by 50%  Right rotation Limited by 50%  Left rotation Limited by 50%   (Blank rows = not tested)  LOWER EXTREMITY ROM:  WFL  LOWER EXTREMITY MMT: Bil hips grossly 3+/5; knees 4+/5  PALPATION:   General  no TTP throughout assessment; but mild fascial restrictions in lower abdomen                External Perineal Exam no TTP, mild redness throughout vulva                             Internal Pelvic Floor no TTP No emotional/communication barriers or cognitive limitation. Patient is motivated to learn. Patient understands and agrees with treatment goals and plan. PT explains patient will be examined in standing, sitting, and lying down to see how their muscles and joints work. When they are ready, they will be asked to remove their underwear so PT can examine their perineum. The patient is also given the option of providing their own chaperone as one is not provided in our facility. The patient also has the right and is explained the right to defer or refuse any part of the evaluation or treatment including the internal exam. With the  patient's consent, PT will use one gloved finger to gently assess the muscles of the pelvic floor, seeing how well it contracts and relaxes and if there is muscle symmetry. After, the patient will get dressed and PT and patient will discuss exam findings and plan of care. PT and patient discuss plan of care, schedule, attendance policy and HEP activities.   PELVIC MMT:   MMT eval  Vaginal 3/5; 8s; 6 reps  Internal Anal Sphincter   External Anal Sphincter   Puborectalis   Diastasis Recti   (Blank rows = not tested)        TONE: Slightly decreased   PROLAPSE: Not seen in hooklying   TODAY'S TREATMENT:                                                                                                                               DATE:   07/30/23 EVAL Examination completed, findings reviewed, pt educated on POC, bladder irritants . Pt motivated to participate in PT and agreeable to attempt recommendations.     PATIENT EDUCATION:  Education details: bladder irritants Person educated: Patient and helper present Education method: Explanation Education comprehension: verbalized understanding, returned demonstration, verbal cues required, tactile cues required, and needs further education  HOME EXERCISE PROGRAM: Bladder irritants   ASSESSMENT:  CLINICAL IMPRESSION: Patient is a 77 y.o. female  who was seen today for physical therapy evaluation and treatment for pain with urination and bet wetting. Pt is limited in mobility with gait mechanics and impaired posture mentioned above, has decreased strength at core and hips, decreased flexibility at spine and hips. Patient consented to internal pelvic floor assessment vaginally this date and found to have decreased strength, endurance, and coordination.but able to complete all contractions, does have intermittent bearing down with attempting to contraction but with cues able to correct and reports she can feel difference. Pt educated on findings and given bladder irritants handout to start for home program. Pt denied questions and motivated to participate. Pt would benefit from additional PT to further address deficits.    OBJECTIVE IMPAIRMENTS: decreased activity tolerance, decreased balance, decreased coordination, decreased endurance, decreased mobility, difficulty walking, decreased strength, increased fascial restrictions, increased muscle spasms, impaired flexibility, improper body mechanics, postural dysfunction, and pain.   ACTIVITY LIMITATIONS: continence  PARTICIPATION LIMITATIONS: cleaning, shopping, and community activity  PERSONAL FACTORS: Time since onset of injury/illness/exacerbation and 1  comorbidity: medical history  are also affecting patient's functional outcome.   REHAB POTENTIAL: Good  CLINICAL DECISION MAKING: Stable/uncomplicated  EVALUATION COMPLEXITY: Low   GOALS: Goals reviewed with patient? Yes  SHORT TERM GOALS: Target date: 08/27/23  Pt to be I with HEP.  Baseline: Goal status: INITIAL  2.  Pt to demonstrate ability to complete x10 Sit to stand without UE and report no leakage due to improved strength of pelvic floor and coordination of breathing to decreased leakage and improve efficiency to get to bathroom.  Baseline:  Goal  status: INITIAL  3.  Pt to be I with breathing mechanics, relaxation technique for decreased bladder pain.  Baseline:  Goal status: INITIAL   LONG TERM GOALS: Target date: 11/29/23  Pt to be I with advanced HEP.  Baseline:  Goal status: INITIAL  2.  Pt will report 50% reduction of pain due to improvements in posture, strength, and muscle length  Baseline:  Goal status: INITIAL  3.  Pt to report at least 50% decreased urinary leakage due to lifestyle modifications and improved pelvic floor strengthening.  Baseline:  Goal status: INITIAL  4.  Pt to demonstrate at least 4+/5 bil hip strength for improved pelvic stability and functional squats without leakage.  Baseline:  Goal status: INITIAL  5.  Pt to demonstrate at least 24 on Tinetti for decreased fall risk score.  Baseline:  Goal status: INITIAL  PLAN:  PT FREQUENCY: 1x/week  PT DURATION:  8 sessions  PLANNED INTERVENTIONS: Therapeutic exercises, Therapeutic activity, Neuromuscular re-education, Balance training, Gait training, Patient/Family education, Self Care, Joint mobilization, Dry Needling, Spinal mobilization, Cryotherapy, Moist heat, scar mobilization, Taping, Biofeedback, Manual therapy, and Re-evaluation  PLAN FOR NEXT SESSION: manual at abdomen, manual internally, core and hip strengthening, flexibility at spine and hips    Otelia Sergeant, PT,  DPT 09/10/243:23 PM   PHYSICAL THERAPY DISCHARGE SUMMARY  Visits from Start of Care: 1  Current functional level related to goals / functional outcomes: Unable to formally reassess, pt unable to return for additional appointments at this   Remaining deficits: Unable to formally reassess   Education / Equipment: HEP   Patient agrees to discharge. Patient goals were not met. Patient is being discharged due to  per pt caregiver, pt has a lot of other appointments and needs needs assistance at home and would like to take a break with PT at this time and understands to ask for new referral to return to PT if needed.  Otelia Sergeant, PT, DPT 11/04/244:36 PM

## 2023-07-31 LAB — URINE CULTURE

## 2023-08-01 DIAGNOSIS — Z0000 Encounter for general adult medical examination without abnormal findings: Secondary | ICD-10-CM | POA: Diagnosis not present

## 2023-08-01 DIAGNOSIS — Z1389 Encounter for screening for other disorder: Secondary | ICD-10-CM | POA: Diagnosis not present

## 2023-08-01 DIAGNOSIS — E038 Other specified hypothyroidism: Secondary | ICD-10-CM | POA: Diagnosis not present

## 2023-08-01 DIAGNOSIS — D696 Thrombocytopenia, unspecified: Secondary | ICD-10-CM | POA: Diagnosis not present

## 2023-08-01 DIAGNOSIS — E7800 Pure hypercholesterolemia, unspecified: Secondary | ICD-10-CM | POA: Diagnosis not present

## 2023-08-01 DIAGNOSIS — Z6836 Body mass index (BMI) 36.0-36.9, adult: Secondary | ICD-10-CM | POA: Diagnosis not present

## 2023-08-01 DIAGNOSIS — D649 Anemia, unspecified: Secondary | ICD-10-CM | POA: Diagnosis not present

## 2023-08-01 DIAGNOSIS — Z23 Encounter for immunization: Secondary | ICD-10-CM | POA: Diagnosis not present

## 2023-08-01 DIAGNOSIS — I700 Atherosclerosis of aorta: Secondary | ICD-10-CM | POA: Diagnosis not present

## 2023-08-01 DIAGNOSIS — E039 Hypothyroidism, unspecified: Secondary | ICD-10-CM | POA: Diagnosis not present

## 2023-08-01 DIAGNOSIS — E1142 Type 2 diabetes mellitus with diabetic polyneuropathy: Secondary | ICD-10-CM | POA: Diagnosis not present

## 2023-08-01 DIAGNOSIS — F3341 Major depressive disorder, recurrent, in partial remission: Secondary | ICD-10-CM | POA: Diagnosis not present

## 2023-08-01 DIAGNOSIS — I10 Essential (primary) hypertension: Secondary | ICD-10-CM | POA: Diagnosis not present

## 2023-08-01 DIAGNOSIS — R32 Unspecified urinary incontinence: Secondary | ICD-10-CM | POA: Diagnosis not present

## 2023-08-05 ENCOUNTER — Ambulatory Visit: Payer: Self-pay

## 2023-08-05 NOTE — Patient Outreach (Signed)
Care Coordination   Follow Up Visit Note   08/05/2023 Name: [name] [name] MRN: [id number] DOB: December 19, 1945  [name] [name] is a 77 y.o. year old female who sees Alinda Deem, MD for primary care. I spoke with  [name] by phone today.  What matters to the patients health and wellness today?  Patient reports that she is about the same. Reports that she continues to have frequent UTIs.  Report no UTI now.   Reports no hemorrhoid issues today.  Denies any new concerns today.  Reports she is self managing her pain with medications.    Goals Addressed               This Visit's Progress     COMPLETED: I have chronic pain and hemorrhoid pain (pt-stated)        Interventions Today    Flowsheet Row Most Recent Value  Chronic Disease   Chronic disease during today's visit Other  [bladder concerns, pain and hemorrhoids]  General Interventions   General Interventions Discussed/Reviewed General Interventions Discussed, Doctor Visits  Doctor Visits Discussed/Reviewed Doctor Visits Discussed, Doctor Visits Reviewed, Specialist  Exercise Interventions   Exercise Discussed/Reviewed Exercise Discussed  Nutrition Interventions   Nutrition Discussed/Reviewed Nutrition Discussed  Pharmacy Interventions   Pharmacy Dicussed/Reviewed Medications and their functions      Patient needed phone number for Urogynecology at [facility].  Provided contact number for patient.  Reviewed case closure with patient due to goals met.  Encouraged patient to call me in the future.  She agreed.         SDOH assessments and interventions completed:  No     Care Coordination Interventions:  Yes, provided   Follow up plan: No further intervention required.   Encounter Outcome:  Patient Visit Completed   Lonia Chimera, RN, BSN, CEN [facility] Coordinator [phone]

## 2023-08-06 ENCOUNTER — Ambulatory Visit (INDEPENDENT_AMBULATORY_CARE_PROVIDER_SITE_OTHER): Payer: Medicare Other

## 2023-08-06 ENCOUNTER — Other Ambulatory Visit (HOSPITAL_COMMUNITY)
Admission: RE | Admit: 2023-08-06 | Discharge: 2023-08-06 | Disposition: A | Discharge status: Home / Self Care | Payer: Medicare Other | Source: Other Acute Inpatient Hospital | Attending: Obstetrics and Gynecology | Admitting: Obstetrics and Gynecology

## 2023-08-06 VITALS — BP 127/69 | HR 90

## 2023-08-06 DIAGNOSIS — R319 Hematuria, unspecified: Secondary | ICD-10-CM

## 2023-08-06 DIAGNOSIS — R82998 Other abnormal findings in urine: Secondary | ICD-10-CM | POA: Insufficient documentation

## 2023-08-06 DIAGNOSIS — N3010 Interstitial cystitis (chronic) without hematuria: Secondary | ICD-10-CM | POA: Diagnosis not present

## 2023-08-06 DIAGNOSIS — R350 Frequency of micturition: Secondary | ICD-10-CM

## 2023-08-06 LAB — POCT URINALYSIS DIPSTICK
Bilirubin, UA: NEGATIVE
Glucose, UA: NEGATIVE
Ketones, UA: NEGATIVE
Nitrite, UA: NEGATIVE
Protein, UA: POSITIVE — AB
Spec Grav, UA: >=1.03 — AB (ref 1.010–1.025)
Urobilinogen, UA: 0.2 U/dL
pH, UA: 6 (ref 5.0–8.0)

## 2023-08-06 LAB — URINALYSIS, ROUTINE W REFLEX MICROSCOPIC
Bilirubin Urine: NEGATIVE
Glucose, UA: NEGATIVE mg/dL
Ketones, ur: NEGATIVE mg/dL
Nitrite: NEGATIVE
Protein, ur: 100 mg/dL — AB
Specific Gravity, Urine: 1.015 (ref 1.005–1.030)
WBC, UA: >50 WBC/hpf (ref 0–5)
pH: 5 (ref 5.0–8.0)

## 2023-08-06 MED ORDER — SODIUM BICARBONATE 8.4 % IV SOLN
5.0000 mL | Freq: Once | INTRAVENOUS | Status: AC
Start: 2023-08-06 — End: 2023-08-06
  Administered 2023-08-06: 5 mL

## 2023-08-06 MED ORDER — HEPARIN SODIUM (PORCINE) 10000 UNIT/ML IJ SOLN
10000.0000 [IU] | Freq: Once | INTRAMUSCULAR | Status: AC
  Administered 2023-08-06: 10000 [IU] via INTRAVESICAL

## 2023-08-06 MED ORDER — LIDOCAINE HCL 2 % IJ SOLN
20.0000 mL | Freq: Once | INTRAMUSCULAR | Status: AC
Start: 2023-08-06 — End: 2023-08-06
  Administered 2023-08-06: 400 mg

## 2023-08-06 MED ORDER — BUPIVACAINE HCL 0.25 % IJ SOLN
20.0000 mL | Freq: Once | INTRAMUSCULAR | Status: AC
Start: 2023-08-06 — End: 2023-08-06
  Administered 2023-08-06: 20 mL

## 2023-08-06 NOTE — Patient Instructions (Addendum)
Your Urine dip that was done in office was Positive. I am sending the urine off for culture and you can take AZO over the counter for your discomfort.  We will contact you when the results are back between 3-5 days. If an antibiotic is needed we will sent the order to the pharmacy and you will be notified. If you have any questions or concerns please feel free to call us at [phone]

## 2023-08-06 NOTE — Progress Notes (Signed)
[name] [name] is a 77 y.o. female arrived today with UTI sx.  Per Dr. Jari Favre protocol: A urine specimen was collected and POCT Urine was done and urine culture sent to the lab. POCT Urine was Positive.

## 2023-08-08 ENCOUNTER — Ambulatory Visit: Payer: Medicare Other | Admitting: Physical Therapy

## 2023-08-08 LAB — URINE CULTURE: Culture: 10000 — AB

## 2023-08-09 NOTE — Progress Notes (Signed)
I attempted to call the patient 2x. Someone picked up and said nothing both times. NO VM for me to leave a message

## 2023-08-09 NOTE — Progress Notes (Signed)
If she says she is symptomatic let me know and I will send something in but 10k colony growth of both is very low and not normally indicative of antibiotic need.

## 2023-08-12 ENCOUNTER — Ambulatory Visit: Payer: Medicare Other

## 2023-08-16 NOTE — Progress Notes (Signed)
Pt never returned call. 

## 2023-08-19 ENCOUNTER — Ambulatory Visit (INDEPENDENT_AMBULATORY_CARE_PROVIDER_SITE_OTHER): Payer: Medicare Other | Admitting: Obstetrics and Gynecology

## 2023-08-19 VITALS — BP 90/42 | HR 61

## 2023-08-19 DIAGNOSIS — N3010 Interstitial cystitis (chronic) without hematuria: Secondary | ICD-10-CM | POA: Diagnosis not present

## 2023-08-19 DIAGNOSIS — I950 Idiopathic hypotension: Secondary | ICD-10-CM | POA: Diagnosis not present

## 2023-08-19 DIAGNOSIS — Z5321 Procedure and treatment not carried out due to patient leaving prior to being seen by health care provider: Secondary | ICD-10-CM | POA: Diagnosis not present

## 2023-08-19 DIAGNOSIS — E1142 Type 2 diabetes mellitus with diabetic polyneuropathy: Secondary | ICD-10-CM | POA: Diagnosis not present

## 2023-08-19 DIAGNOSIS — I10 Essential (primary) hypertension: Secondary | ICD-10-CM | POA: Diagnosis not present

## 2023-08-19 DIAGNOSIS — I482 Chronic atrial fibrillation, unspecified: Secondary | ICD-10-CM | POA: Diagnosis not present

## 2023-08-19 DIAGNOSIS — I959 Hypotension, unspecified: Secondary | ICD-10-CM | POA: Diagnosis not present

## 2023-08-19 DIAGNOSIS — I5022 Chronic systolic (congestive) heart failure: Secondary | ICD-10-CM | POA: Diagnosis not present

## 2023-08-19 DIAGNOSIS — M150 Primary generalized (osteo)arthritis: Secondary | ICD-10-CM | POA: Diagnosis not present

## 2023-08-19 DIAGNOSIS — E7800 Pure hypercholesterolemia, unspecified: Secondary | ICD-10-CM | POA: Diagnosis not present

## 2023-08-19 NOTE — Progress Notes (Signed)
Patients BP was too low/ Bladder instillation was not done.

## 2023-08-19 NOTE — Progress Notes (Signed)
[facility] Return Visit  SUBJECTIVE  History of Present Illness: [name] [name] is a 77 y.o. female seen in follow-up for bladder pain syndrome.  Patient has a pertinent history of fibromyalgia and IBS as well she reports she has recurrent pain with urination and has had low growth cultures.  Today she was presenting for bladder instillation #3.  Upon checking her vitals today and were very low in her bilateral arms and patient reported feeling very weak.  She reports she recently had dental work done this morning which could be a contributing factor but she reports she does not believe that this.   Past Medical History: Patient  has a past medical history of Allergic rhinitis, Anxiety, ANXIETY (12/26/2007), Arthritis, Bronchitis, Chest pain in adult (12/24/2018), CHF (congestive heart failure) (HCC), CHF, MILD (12/26/2007), Chronic atrial fibrillation (HCC) (04/24/2016), Chronic diastolic heart failure (HCC), Chronic liver disease (03/15/2016), Closed fracture of proximal tibia (03/13/2018), Complication of anesthesia, Depression, Enlarged heart, Esophageal reflux (07/04/2017), Essential hypertension (12/26/2007), Fibromyalgia, GERD (gastroesophageal reflux disease), HTN (hypertension), Hypercholesteremia, Hyperlipidemia (02/29/2016), Hypertensive heart disease, Hypothyroidism (03/20/2016), Idiopathic cirrhosis (HCC), Mild CAD (02/29/2016), Myocardial infarction (HCC), Neuropathy, peripheral, OSA (obstructive sleep apnea), OSA on CPAP (07/04/2008), Presence of Watchman left atrial appendage closure device (01/30/2018), Recurrent UTI (08/04/2021), Seasonal and perennial allergic rhinitis (01/23/2011), Simple chronic bronchitis (HCC) (12/26/2007), Sleep apnea, Somnolence, SOMNOLENCE (12/26/2007), Splenomegaly, Swelling of joint, knee, right (01/30/2018), Type 2 diabetes mellitus (HCC) (12/29/2007), Type 2 diabetes mellitus without complications (HCC), Type II or unspecified type diabetes mellitus without mention of  complication, not stated as uncontrolled, Urinary incontinence, nocturnal enuresis, and Urinary, incontinence, stress female.   Past Surgical History: She  has a past surgical history that includes Mouth surgery; Carpal tunnel release; Total abdominal hysterectomy; Appendectomy; Knee arthroscopy; Eye surgery; Back surgery; Lumbar laminectomy (03/2011; 01/2012); Tonsillectomy and adenoidectomy; Dilation and curettage of uterus; Cataract extraction w/ intraocular lens  implant, bilateral; Abdominal hysterectomy; and Splenectomy.   Medications: She has a current medication list which includes the following prescription(s): amlodipine, bupropion, carvedilol, doxazosin, estradiol, furosemide, gabapentin, irbesartan, isosorbide mononitrate, levothyroxine, metformin, nitrofurantoin (macrocrystal-monohydrate), nitrostat, omeprazole, potassium chloride sa, rosuvastatin, tramadol, venlafaxine xr, vibegron, and vitamin d (ergocalciferol).   Allergies: Patient is allergic to azithromycin, cefuroxime axetil, celecoxib, codeine, hydralazine hcl, sulfa antibiotics, amlodipine, canagliflozin, cefuroxime, exenatide, hydralazine hcl, other, and norvasc [amlodipine besylate].   Social History: Patient  reports that she has never smoked. She has never used smokeless tobacco. She reports that she does not drink alcohol and does not use drugs.      OBJECTIVE     Physical Exam: Vitals:   08/19/23 1320 08/19/23 1345 08/19/23 1353 08/19/23 1400  BP: (!) 100/36 (!) 71/29 (!) 90/38 (!) 90/42  Pulse: (!) 53 60 61    Gen: No apparent distress, A&O x 3.  Detailed Urogynecologic Evaluation:  Deferred.   ASSESSMENT AND PLAN    [name] [name] is a 77 y.o. with:  1. Idiopathic hypotension   2. Bladder pain syndrome    The patient did have hypotension today and we did not feel comfortable performing a bladder instillation.  She did have a caregiver with her family encouraged to take her to seek evaluation at an urgent  care or ER.  The caregiver replied that she would be taking her to ask for new or evaluation of her low blood pressure and weakness. Patient reports that she has not had any improvement with the last 2 bladder installations and wanted to know what other options there  were.  We briefly discussed there were other options including bladder Botox that would require a potential cystoscopy.  She is open to this idea and would like to speak with one of the surgical providers.  We did not talk in depth about the options today due to the low blood pressure concerns.  We can discuss this further at his appointment with patient's blood pressure is more stable.  Patient to follow up to discuss options for bladder pain treatment.

## 2023-08-26 ENCOUNTER — Ambulatory Visit: Payer: Medicare Other

## 2023-09-02 ENCOUNTER — Ambulatory Visit: Payer: Medicare Other

## 2023-09-02 ENCOUNTER — Ambulatory Visit (HOSPITAL_BASED_OUTPATIENT_CLINIC_OR_DEPARTMENT_OTHER)
Admission: RE | Admit: 2023-09-02 | Discharge: 2023-09-02 | Disposition: A | Discharge status: Home / Self Care | Payer: Medicare Other | Source: Ambulatory Visit | Attending: Obstetrics and Gynecology | Admitting: Obstetrics and Gynecology

## 2023-09-02 DIAGNOSIS — R319 Hematuria, unspecified: Secondary | ICD-10-CM | POA: Insufficient documentation

## 2023-09-09 ENCOUNTER — Ambulatory Visit (INDEPENDENT_AMBULATORY_CARE_PROVIDER_SITE_OTHER): Payer: Medicare Other | Admitting: Adult Health

## 2023-09-09 ENCOUNTER — Encounter: Payer: Self-pay | Admitting: Adult Health

## 2023-09-09 VITALS — BP 132/74 | HR 70 | Temp 98.3°F | Ht 63.0 in | Wt 189.8 lb

## 2023-09-09 DIAGNOSIS — G4733 Obstructive sleep apnea (adult) (pediatric): Secondary | ICD-10-CM

## 2023-09-09 NOTE — Assessment & Plan Note (Signed)
History of very severe sleep apnea on CPAP for many years unfortunately patient has been not able to tolerate CPAP over the last year.  CPAP machine does not appear to be working correctly and has not been able to find an appropriate mask.  Will set patient up for CPAP titration study. Once results are returned we will order new CPAP and settings  Plan  Patient Instructions  Set up for CPAP titration study. Once results are back can send order for new CPAP.  Healthy sleep regimen Do not drive if sleepy  Follow up with Dr. Maple Hudson  in 3 months and As needed

## 2023-09-09 NOTE — Progress Notes (Signed)
@Patient  ID: [name] [name], female    DOB: [birth year], 77 y.o.   MRN: [id number]  Chief Complaint  Patient presents with   Follow-up    Referring provider: Alinda Deem, MD  HPI: 77 year old female followed for obstructive sleep apnea Medical history significant for A-fib, diastolic heart failure, nonalcoholic cirrhosis, chronic bronchitis  TEST/EVENTS :  NPSG 11/05/01-AHI 93/hour, desaturation to 63%  PFT 07/26/09-mild restriction and reduction of diffusion. No obstruction, no response to dilator   09/09/2023 Follow up : OSA  Patient presents for a 1 year follow-up.  Patient has underlying obstructive sleep apnea.  Patient has been on CPAP for many years.  Says over the last year that her CPAP machine has not been working correctly.  Has been unable to tolerate and wear her CPAP.  She has tried different mask but has not been able to get a good fit and therefore has been unable to tolerate.  She says that it is making her very tired sleep is very restless.  Says that previously she slept with CPAP slept throughout the entire night and benefited greatly from CPAP.  She wants to get back on CPAP.  Patient says she is also working on weight loss and is down almost 90 pounds.  She complains of snoring and significant daytime sleepiness   Allergies  Allergen Reactions   Azithromycin Swelling   Cefuroxime Axetil Hives and Swelling   Celecoxib Swelling    Other reaction(s): Unknown   Codeine Nausea And Vomiting    Other reaction(s): Unknown   Hydralazine Hcl Swelling    unknown   Sulfa Antibiotics Rash and Swelling    "makes bottom of her feet swell up real bad"  Other reaction(s): Unknown   Amlodipine Swelling    Other reaction(s): swelling   Hydralazine Hcl Cough   Canagliflozin     Other reaction(s): stomach problems    Cefuroxime     Other reaction(s): Unknown   Exenatide     Other reaction(s): stomach upset    Other     Other reaction(s): Unknown   Norvasc  [Amlodipine Besylate] Swelling    Immunization History  Administered Date(s) Administered   Fluad Quad(high Dose 65+) 07/29/2019, 07/20/2020, 07/31/2021   H1N1 10/25/2008   Influenza Split 08/01/2010, 08/19/2012, 08/19/2013, 09/02/2013, 08/19/2014, 08/16/2016, 07/21/2019   Influenza Whole 09/18/2009, 08/20/2011   Influenza, High Dose Seasonal PF 09/17/2018   Influenza,inj,Quad PF,6+ Mos 07/12/2017   Influenza-Unspecified 08/20/2015   Moderna Sars-Covid-2 Vaccination 01/04/2020   Pneumococcal Conjugate-13 06/21/2015   Pneumococcal Polysaccharide-23 11/19/1998, 07/27/2013   Pneumococcal-Unspecified 07/21/2015    Past Medical History:  Diagnosis Date   Allergic rhinitis    Anxiety    ANXIETY 12/26/2007   Qualifier: Diagnosis of  By: Yetta Barre CNA/MA, Jessica     Arthritis    Bronchitis    Chest pain in adult 12/24/2018   CHF (congestive heart failure) (HCC)    CHF, MILD 12/26/2007   Qualifier: Diagnosis of  By: Yetta Barre CNA/MA, Jessica     Chronic atrial fibrillation (HCC) 04/24/2016   Watchman atrial appendage device placed at [facility] for clot pevention   Chronic diastolic heart failure (HCC)    Chronic liver disease 03/15/2016   Closed fracture of proximal tibia 03/13/2018   Complication of anesthesia    low blood pressure once   Depression    Enlarged heart    Esophageal reflux 07/04/2017   Essential hypertension 12/26/2007   Qualifier: Diagnosis of  By: Yetta Barre CNA/MA, Shanda Bumps     Fibromyalgia  GERD (gastroesophageal reflux disease)    HTN (hypertension)    on medication since age 21   Hypercholesteremia    Hyperlipidemia 02/29/2016   Hypertensive heart disease    on medication since age 61   Hypothyroidism 03/20/2016   Idiopathic cirrhosis (HCC)    stage 4; sees Dr. Charm Barges in [place]   Mild CAD 02/29/2016   Myocardial infarction [facility])    age 18   Neuropathy, peripheral    lower extremities   OSA (obstructive sleep apnea)    OSA on CPAP 07/04/2008   CPAP AutoSet/ [facility]    Presence  of Watchman left atrial appendage closure device 01/30/2018   Recurrent UTI 08/04/2021   Seasonal and perennial allergic rhinitis 01/23/2011   Allergy vaccine restarted at 1:50 08/02/2011 [facility], DC'd 2016    Simple chronic bronchitis (HCC) 12/26/2007   PFT 07/26/09-mild restriction and reduction of diffusion. No obstruction, no response to dilator    Sleep apnea    Somnolence    SOMNOLENCE 12/26/2007   Annotation: excessive daytime Qualifier: Diagnosis of  By: Yetta Barre CNA/MA, Jessica     Splenomegaly    Swelling of joint, knee, right 01/30/2018   Type 2 diabetes mellitus (HCC) 12/29/2007   Qualifier: Diagnosis of  By: Maple Hudson MD, Clinton D    Type 2 diabetes mellitus without complications (HCC)    Type II or unspecified type diabetes mellitus without mention of complication, not stated as uncontrolled    Urinary incontinence, nocturnal enuresis    Urinary, incontinence, stress female    wears depends    Tobacco History: Social History   Tobacco Use  Smoking Status Never  Smokeless Tobacco Never   Counseling given: Not Answered   Outpatient Medications Prior to Visit  Medication Sig Dispense Refill   amLODipine (NORVASC) 5 MG tablet Take 2.5 mg by mouth 2 (two) times daily.     buPROPion (WELLBUTRIN XL) 300 MG 24 hr tablet Take 300 mg by mouth daily.     carvedilol (COREG) 6.25 MG tablet Take 6.25 mg by mouth in the morning and at bedtime.     doxazosin (CARDURA) 4 MG tablet TAKE 1 TABLET BY MOUTH DAILY 90 tablet 0   estradiol (ESTRACE) 0.1 MG/GM vaginal cream Place 0.5 g vaginally 2 (two) times a week. Place 0.5g nightly for two weeks then twice a week after 42 each 11   furosemide (LASIX) 20 MG tablet TAKE 1 TABLET BY MOUTH FOR 2 DAYS THEN TAKE ONLY ON MONDAYS, WEDNESDAYS AND FRIDAYS 40 tablet 1   gabapentin (NEURONTIN) 600 MG tablet Take 600 mg by mouth 3 (three) times daily.      irbesartan (AVAPRO) 300 MG tablet Take 300 mg by mouth daily.     isosorbide mononitrate (IMDUR) 60 MG 24 hr  tablet Take 60 mg by mouth daily.     levothyroxine (SYNTHROID, LEVOTHROID) 125 MCG tablet Take 125 mcg by mouth daily before breakfast.      metFORMIN (GLUCOPHAGE-XR) 500 MG 24 hr tablet Take 500 mg by mouth 2 (two) times daily.      nitrofurantoin, macrocrystal-monohydrate, (MACROBID) 100 MG capsule Take 1 capsule (100 mg total) by mouth daily. 30 capsule 5   NITROSTAT 0.4 MG SL tablet DISSOLVE 1 TABLET UNDER THE TONGUE AS NEEDED FOR CHEST PAIN. 25 tablet 1   omeprazole (PRILOSEC) 40 MG capsule Take 1 capsule by mouth 2 (two) times daily.     potassium chloride SA (K-DUR,KLOR-CON) 20 MEQ tablet Take 40 mEq by mouth 3 (three)  times daily.      rosuvastatin (CRESTOR) 5 MG tablet Take 1 tablet (5 mg total) by mouth daily. 90 tablet 0   traMADol (ULTRAM) 50 MG tablet Take 50 mg by mouth every 6 (six) hours as needed for moderate pain or severe pain. For pain     venlafaxine XR (EFFEXOR-XR) 150 MG 24 hr capsule Take 150 mg by mouth daily with breakfast.      Vibegron 75 MG TABS Take 1 tablet (75 mg total) by mouth daily. 90 tablet 2   Vitamin D, Ergocalciferol, (DRISDOL) 1.25 MG (50000 UNIT) CAPS capsule Take 50,000 Units by mouth once a week.     No facility-administered medications prior to visit.     Review of Systems:   Constitutional:   No  weight loss, night sweats,  Fevers, chills, +fatigue, or  lassitude.  HEENT:   No headaches,  Difficulty swallowing,  Tooth/dental problems, or  Sore throat,                No sneezing, itching, ear ache, nasal congestion, post nasal drip,   CV:  No chest pain,  Orthopnea, PND, swelling in lower extremities, anasarca, dizziness, palpitations, syncope.   GI  No heartburn, indigestion, abdominal pain, nausea, vomiting, diarrhea, change in bowel habits, loss of appetite, bloody stools.   Resp: No shortness of breath with exertion or at rest.  No excess mucus, no productive cough,  No non-productive cough,  No coughing up of blood.  No change in color of  mucus.  No wheezing.  No chest wall deformity  Skin: no rash or lesions.  GU: no dysuria, change in color of urine, no urgency or frequency.  No flank pain, no hematuria   MS:  No joint pain or swelling.  No decreased range of motion.  No back pain.    Physical Exam  BP 132/74 (BP Location: Left Arm, Cuff Size: Normal)   Pulse 70   Temp 98.3 F (36.8 C) (Oral)   Ht 5\' 3"  (1.6 m)   Wt 189 lb 12.8 oz (86.1 kg)   SpO2 97%   BMI 33.62 kg/m   GEN: A/Ox3; pleasant , NAD, well nourished    HEENT:  [place]/AT,   NOSE-clear, THROAT-clear, no lesions, no postnasal drip or exudate noted.   NECK:  Supple w/ fair ROM; no JVD; normal carotid impulses w/o bruits; no thyromegaly or nodules palpated; no lymphadenopathy.    RESP  Clear  P & A; w/o, wheezes/ rales/ or rhonchi. no accessory muscle use, no dullness to percussion  CARD:  RRR, no m/r/g, no peripheral edema, pulses intact, no cyanosis or clubbing.  GI:   Soft & nt; nml bowel sounds; no organomegaly or masses detected.   Musco: Warm bil, no deformities or joint swelling noted.   Neuro: alert, no focal deficits noted.    Skin: Warm, no lesions or rashes    Lab Results:  CBC    BNP No results found for: "BNP"  ProBNP No results found for: "PROBNP"        No data to display          No results found for: "NITRICOXIDE"      Assessment & Plan:   No problem-specific Assessment & Plan notes found for this encounter.     Rubye Oaks, NP 09/09/2023

## 2023-09-09 NOTE — Patient Instructions (Addendum)
Set up for CPAP titration study. Once results are back can send order for new CPAP.  Healthy sleep regimen Do not drive if sleepy  Follow up with Dr. Maple Hudson  in 3 months and As needed

## 2023-09-11 ENCOUNTER — Ambulatory Visit (INDEPENDENT_AMBULATORY_CARE_PROVIDER_SITE_OTHER): Payer: Medicare Other | Admitting: Podiatry

## 2023-09-11 DIAGNOSIS — S90222A Contusion of left lesser toe(s) with damage to nail, initial encounter: Secondary | ICD-10-CM | POA: Diagnosis not present

## 2023-09-11 NOTE — Progress Notes (Signed)
Chief Complaint  Patient presents with   Nail Problem    Stubbed left 5th toe about 3 weeks ago, nail later came off and had concern about infection   HPI: 77 y.o. female presents today after stubbing the toe a few weeks ago.  States it took awhile to get an appt, but she wanted to make sure everything was okay.  Denies pain today.   Past Medical History:  Diagnosis Date   Allergic rhinitis    Anxiety    ANXIETY 12/26/2007   Qualifier: Diagnosis of  By: Yetta Barre CNA/MA, Jessica     Arthritis    Bronchitis    Chest pain in adult 12/24/2018   CHF (congestive heart failure) (HCC)    CHF, MILD 12/26/2007   Qualifier: Diagnosis of  By: Yetta Barre CNA/MA, Jessica     Chronic atrial fibrillation (HCC) 04/24/2016   Watchman atrial appendage device placed at [facility] for clot pevention   Chronic diastolic heart failure (HCC)    Chronic liver disease 03/15/2016   Closed fracture of proximal tibia 03/13/2018   Complication of anesthesia    low blood pressure once   Depression    Enlarged heart    Esophageal reflux 07/04/2017   Essential hypertension 12/26/2007   Qualifier: Diagnosis of  By: Yetta Barre CNA/MA, Shanda Bumps     Fibromyalgia    GERD (gastroesophageal reflux disease)    HTN (hypertension)    on medication since age 59   Hypercholesteremia    Hyperlipidemia 02/29/2016   Hypertensive heart disease    on medication since age 26   Hypothyroidism 03/20/2016   Idiopathic cirrhosis (HCC)    stage 4; sees Dr. Charm Barges in [place]   Mild CAD 02/29/2016   Myocardial infarction [facility])    age 89   Neuropathy, peripheral    lower extremities   OSA (obstructive sleep apnea)    OSA on CPAP 07/04/2008   CPAP AutoSet/ [facility]    Presence of Watchman left atrial appendage closure device 01/30/2018   Recurrent UTI 08/04/2021   Seasonal and perennial allergic rhinitis 01/23/2011   Allergy vaccine restarted at 1:50 08/02/2011 [facility], DC'd 2016    Simple chronic bronchitis (HCC) 12/26/2007   PFT 07/26/09-mild restriction and  reduction of diffusion. No obstruction, no response to dilator    Sleep apnea    Somnolence    SOMNOLENCE 12/26/2007   Annotation: excessive daytime Qualifier: Diagnosis of  By: Yetta Barre CNA/MA, Jessica     Splenomegaly    Swelling of joint, knee, right 01/30/2018   Type 2 diabetes mellitus (HCC) 12/29/2007   Qualifier: Diagnosis of  By: Maple Hudson MD, Clinton D    Type 2 diabetes mellitus without complications (HCC)    Type II or unspecified type diabetes mellitus without mention of complication, not stated as uncontrolled    Urinary incontinence, nocturnal enuresis    Urinary, incontinence, stress female    wears depends    Past Surgical History:  Procedure Laterality Date   ABDOMINAL HYSTERECTOMY     APPENDECTOMY     BACK SURGERY     CARPAL TUNNEL RELEASE     bilaterally   CATARACT EXTRACTION W/ INTRAOCULAR LENS  IMPLANT, BILATERAL     DILATION AND CURETTAGE OF UTERUS     EYE SURGERY     cataract ext/ iol implants   KNEE ARTHROSCOPY     LUMBAR LAMINECTOMY  03/2011; 01/2012   MOUTH SURGERY     SPLENECTOMY     TONSILLECTOMY AND  ADENOIDECTOMY     TOTAL ABDOMINAL HYSTERECTOMY      Allergies  Allergen Reactions   Azithromycin Swelling   Cefuroxime Axetil Hives and Swelling   Celecoxib Swelling    Other reaction(s): Unknown   Codeine Nausea And Vomiting    Other reaction(s): Unknown   Hydralazine Hcl Swelling    unknown   Sulfa Antibiotics Rash and Swelling    "makes bottom of her feet swell up real bad"  Other reaction(s): Unknown   Amlodipine Swelling    Other reaction(s): swelling   Hydralazine Hcl Cough   Canagliflozin     Other reaction(s): stomach problems    Cefuroxime     Other reaction(s): Unknown   Exenatide     Other reaction(s): stomach upset    Other     Other reaction(s): Unknown   Norvasc [Amlodipine Besylate] Swelling   Physical Exam: Palpable pedal pulses left foot.  No edema or erythema to left 5th toe.  Nail is gone with dry, clear nail bed.  No pain  on palpation of area.   Assessment/Plan of Care: 1. Contusion of left lesser toe(s) with damage to nail, initial encounter    Discussed clinical findings with patient today.  No treatment needed today.  Informed patient the area looks fine since losing the toenail and she will eventually have new nail grow in.  She will keep her upcoming appt with Dr. Stacie Acres for nail care.   Clerance Lav, DPM, FACFAS [facility]     [address] [place], [place] [address]                Office [phone]  Fax [phone]

## 2023-09-16 ENCOUNTER — Ambulatory Visit: Payer: Medicare Other | Admitting: Obstetrics

## 2023-09-16 NOTE — Progress Notes (Deleted)
[facility] Return Visit  SUBJECTIVE  History of Present Illness: Mikael Dryer is a 77 y.o. female seen in follow-up for bladder pain syndrome. Plan at last visit was ***.   Prior evaluation by Dr. Logan Bores with significant debris on cystoscopy in 2022 and tried daily Trimethoprim suppression.     Past Medical History: Patient  has a past medical history of Allergic rhinitis, Anxiety, ANXIETY (12/26/2007), Arthritis, Bronchitis, Chest pain in adult (12/24/2018), CHF (congestive heart failure) (HCC), CHF, MILD (12/26/2007), Chronic atrial fibrillation (HCC) (04/24/2016), Chronic diastolic heart failure (HCC), Chronic liver disease (03/15/2016), Closed fracture of proximal tibia (03/13/2018), Complication of anesthesia, Depression, Enlarged heart, Esophageal reflux (07/04/2017), Essential hypertension (12/26/2007), Fibromyalgia, GERD (gastroesophageal reflux disease), HTN (hypertension), Hypercholesteremia, Hyperlipidemia (02/29/2016), Hypertensive heart disease, Hypothyroidism (03/20/2016), Idiopathic cirrhosis (HCC), Mild CAD (02/29/2016), Myocardial infarction (HCC), Neuropathy, peripheral, OSA (obstructive sleep apnea), OSA on CPAP (07/04/2008), Presence of Watchman left atrial appendage closure device (01/30/2018), Recurrent UTI (08/04/2021), Seasonal and perennial allergic rhinitis (01/23/2011), Simple chronic bronchitis (HCC) (12/26/2007), Sleep apnea, Somnolence, SOMNOLENCE (12/26/2007), Splenomegaly, Swelling of joint, knee, right (01/30/2018), Type 2 diabetes mellitus (HCC) (12/29/2007), Type 2 diabetes mellitus without complications (HCC), Type II or unspecified type diabetes mellitus without mention of complication, not stated as uncontrolled, Urinary incontinence, nocturnal enuresis, and Urinary, incontinence, stress female.   Past Surgical History: She  has a past surgical history that includes Mouth surgery; Carpal tunnel release; Total abdominal hysterectomy; Appendectomy; Knee arthroscopy; Eye surgery;  Back surgery; Lumbar laminectomy (03/2011; 01/2012); Tonsillectomy and adenoidectomy; Dilation and curettage of uterus; Cataract extraction w/ intraocular lens  implant, bilateral; Abdominal hysterectomy; and Splenectomy.   Medications: She has a current medication list which includes the following prescription(s): amlodipine, bupropion, carvedilol, doxazosin, estradiol, furosemide, gabapentin, irbesartan, isosorbide mononitrate, levothyroxine, metformin, nitrofurantoin (macrocrystal-monohydrate), nitrostat, omeprazole, potassium chloride sa, rosuvastatin, tramadol, venlafaxine xr, vibegron, and vitamin d (ergocalciferol).   Allergies: Patient is allergic to azithromycin, cefuroxime axetil, celecoxib, codeine, hydralazine hcl, sulfa antibiotics, amlodipine, hydralazine hcl, canagliflozin, cefuroxime, exenatide, other, and norvasc [amlodipine besylate].   Social History: Patient  reports that she has never smoked. She has never used smokeless tobacco. She reports that she does not drink alcohol and does not use drugs.      OBJECTIVE     Physical Exam: There were no vitals filed for this visit. Gen: No apparent distress, A&O x 3.  Detailed Urogynecologic Evaluation:  Deferred. Prior exam showed:      No data to display             ASSESSMENT AND PLAN    Ms. Okawa is a 77 y.o. with:  No diagnosis found.

## 2023-09-19 DIAGNOSIS — I10 Essential (primary) hypertension: Secondary | ICD-10-CM | POA: Diagnosis not present

## 2023-09-19 DIAGNOSIS — Z79899 Other long term (current) drug therapy: Secondary | ICD-10-CM | POA: Diagnosis not present

## 2023-09-19 DIAGNOSIS — I482 Chronic atrial fibrillation, unspecified: Secondary | ICD-10-CM | POA: Diagnosis not present

## 2023-09-19 DIAGNOSIS — M150 Primary generalized (osteo)arthritis: Secondary | ICD-10-CM | POA: Diagnosis not present

## 2023-09-19 DIAGNOSIS — E1142 Type 2 diabetes mellitus with diabetic polyneuropathy: Secondary | ICD-10-CM | POA: Diagnosis not present

## 2023-09-23 ENCOUNTER — Ambulatory Visit: Payer: Medicare Other | Attending: Obstetrics and Gynecology | Admitting: Physical Therapy

## 2023-09-23 ENCOUNTER — Other Ambulatory Visit (HOSPITAL_COMMUNITY)
Admission: RE | Admit: 2023-09-23 | Discharge: 2023-09-23 | Disposition: A | Discharge status: Home / Self Care | Payer: Medicare Other | Source: Other Acute Inpatient Hospital | Attending: Obstetrics | Admitting: Obstetrics

## 2023-09-23 ENCOUNTER — Encounter: Payer: Self-pay | Admitting: Obstetrics

## 2023-09-23 ENCOUNTER — Ambulatory Visit (INDEPENDENT_AMBULATORY_CARE_PROVIDER_SITE_OTHER): Payer: Medicare Other | Admitting: Obstetrics

## 2023-09-23 ENCOUNTER — Telehealth: Payer: Self-pay | Admitting: Physical Therapy

## 2023-09-23 VITALS — BP 99/63 | HR 85

## 2023-09-23 DIAGNOSIS — R278 Other lack of coordination: Secondary | ICD-10-CM | POA: Insufficient documentation

## 2023-09-23 DIAGNOSIS — R319 Hematuria, unspecified: Secondary | ICD-10-CM | POA: Diagnosis not present

## 2023-09-23 DIAGNOSIS — R2689 Other abnormalities of gait and mobility: Secondary | ICD-10-CM | POA: Insufficient documentation

## 2023-09-23 DIAGNOSIS — M62838 Other muscle spasm: Secondary | ICD-10-CM | POA: Insufficient documentation

## 2023-09-23 DIAGNOSIS — M6281 Muscle weakness (generalized): Secondary | ICD-10-CM | POA: Insufficient documentation

## 2023-09-23 DIAGNOSIS — N3944 Nocturnal enuresis: Secondary | ICD-10-CM | POA: Insufficient documentation

## 2023-09-23 DIAGNOSIS — N3010 Interstitial cystitis (chronic) without hematuria: Secondary | ICD-10-CM

## 2023-09-23 DIAGNOSIS — R350 Frequency of micturition: Secondary | ICD-10-CM | POA: Diagnosis not present

## 2023-09-23 DIAGNOSIS — R293 Abnormal posture: Secondary | ICD-10-CM | POA: Insufficient documentation

## 2023-09-23 DIAGNOSIS — R82998 Other abnormal findings in urine: Secondary | ICD-10-CM | POA: Diagnosis present

## 2023-09-23 LAB — POCT URINALYSIS DIPSTICK
Bilirubin, UA: NEGATIVE
Glucose, UA: NEGATIVE
Nitrite, UA: NEGATIVE
Protein, UA: POSITIVE — AB
Spec Grav, UA: >=1.03 — AB (ref 1.010–1.025)
Urobilinogen, UA: 1 U/dL
pH, UA: 6 (ref 5.0–8.0)

## 2023-09-23 NOTE — Telephone Encounter (Signed)
PT spoke to pt's caregiver on the phone and reports pt would like to take a break from PT at this time due to a lot of appointments. Understands she will need a new referral for PT for future needs and will be discharged. Caregiver verbalized understanding.   Otelia Sergeant, PT, DPT 11/04/244:40 PM

## 2023-09-23 NOTE — Progress Notes (Signed)
CYSTOSCOPY  CC:  This is a 77 y.o. with gross hematuria and pain with urination , bladder pain who presents today for cystoscopy.  Reports intermittent bright red blood in urine noted in the toilet bowl 1x/week for years.  Reports trimethoprim 100mg  daily  Pain with urination Last evaluated by urology in [place] Bladder instillation x 2 without relief. Reports relief of urinary frequency with Gemtesa   Renal ultrasound 09/02/23 FINDINGS: Right Kidney:   Renal measurements: 9.9 x 4.7 x 4.7 cm = volume: 115.7 mL. Echogenicity within normal limits. No mass or hydronephrosis visualized.   Left Kidney:   Renal measurements: 10.4 x 6.2 x 5.1 cm = volume: 170.0 mL. Echogenicity within normal limits. No mass or hydronephrosis visualized.   Bladder:   Appears normal for degree of bladder distention.   Other:   None.   IMPRESSION: No hydronephrosis.     Electronically Signed   By: Annia Belt M.D.   On: 09/02/2023 14:58  BP 99/63   Pulse 85   Urinalysis    Component Value Date/Time   COLORURINE YELLOW 08/06/2023 1457   APPEARANCEUR CLOUDY (A) 08/06/2023 1457   LABSPEC 1.015 08/06/2023 1457   PHURINE 5.0 08/06/2023 1457   GLUCOSEU NEGATIVE 08/06/2023 1457   HGBUR MODERATE (A) 08/06/2023 1457   BILIRUBINUR negative 09/23/2023 1448   KETONESUR NEGATIVE 08/06/2023 1457   PROTEINUR Positive (A) 09/23/2023 1448   PROTEINUR 100 (A) 08/06/2023 1457   UROBILINOGEN 1.0 09/23/2023 1448   UROBILINOGEN 1.0 01/26/2012 1503   NITRITE negative 09/23/2023 1448   NITRITE NEGATIVE 08/06/2023 1457   LEUKOCYTESUR Moderate (2+) (A) 09/23/2023 1448   LEUKOCYTESUR LARGE (A) 08/06/2023 1457    CYSTOSCOPY: A time out was performed.  The periurethral area was prepped and draped in a sterile manner.  2% lidocaine jetpack was inserted at the urethral meatus and the urethra and bladder visualized with 12- and 70-degree scopes.  She had diminished urethral coaptation and abnormal  normal  appearing  urethral mucosa.  She had abnormal  inflammation with polypoid lesion over lying left bladder dome  bladder mucosa with white ulceration noted left of L ureteral orifice. She had bilateral clear efflux from both ureteral orifices.  She had no squamous metaplasia at the trigone, no trabeculations, cellules or diverticuli on the right side of her bladder   ASSESSMENT:  77 y.o. with gross hematuria, recurrent UTI, and bladder pain. Cystoscopy today is abnormal  with focal inflammation with polypoid lesion over left bladder dome  .  PLAN:  Follow-up to discuss findings and next step with bladder biopsy needed. Reviewed with office, will expedite care with bladder biopsy under MAC to avoid any additional delay in diagnosis and refer to urology if abnormal All questions answered and post-procedures instructions were given Patient left prior to AVS  - called patient's home phone and transferred to Edwyna Shell (main caregiver) to review best appropriate person to contact regarding care coordination. Ms. Lanae Boast states that she is the main caregiver who coordinates her medical care and transportation. She expresses concerns regarding urinary and fecal incontinence and patient's refusal to change her diaper for over 12 hours at a time and lack of dysuria or hematuria symptoms noted by caregivers.  Expresses understanding and reassured that no skin changes such as uremic dermatitis or ulcerations noted on exam. In addition, discussed gross hematuria workup based on history including CT urogram and cystoscopy with biopsy based on abnormal findings today. Phone number provided for radiology to arrange appointment for  CT urogram and patient will be contacted regarding surgery scheduling.   Loleta Chance, MD

## 2023-09-23 NOTE — Patient Instructions (Addendum)
Taking Care of Yourself after Urodynamics, Cystoscopy, Bulkamid Injection, or Botox Injection   Drink plenty of water for a day or two following your procedure. Try to have about 8 ounces (one cup) at a time, and do this 6 times or more per day unless you have fluid restrictitons AVOID irritative beverages such as coffee, tea, soda, alcoholic or citrus drinks for a day or two, as this may cause burning with urination.  For the first 1-2 days after the procedure, your urine may be pink or red in color. You may have some blood in your urine as a normal side effect of the procedure. Large amounts of bleeding or difficulty urinating are NOT normal. Call the nurse line if this happens or go to the nearest Emergency Room if the bleeding is heavy or you cannot urinate at all and it is after hours. If you had a Bulkamid injection in the urethra and need to be catheterized, ask for a pediatric catheter to be used (size 10 or 12-French) so the material is not pushed out of place.   You may experience some discomfort or a burning sensation with urination after having this procedure. You can use over the counter Azo or pyridium to help with burning and follow the instructions on the packaging. If it does not improve within 1-2 days, or other symptoms appear (fever, chills, or difficulty urinating) call the office to speak to a nurse.  You may return to normal daily activities such as work, school, driving, exercising and housework on the day of the procedure. If your doctor gave you a prescription, take it as ordered.   Continue your daily Trimpex.   We will schedule for a bladder biopsy.  Please schedule your CT urogram as soon as you can.

## 2023-09-25 ENCOUNTER — Other Ambulatory Visit: Payer: Self-pay | Admitting: Cardiology

## 2023-09-25 LAB — URINE CULTURE: Culture: >=100000 — AB

## 2023-09-26 ENCOUNTER — Other Ambulatory Visit: Payer: Self-pay | Admitting: Obstetrics

## 2023-09-26 DIAGNOSIS — R319 Hematuria, unspecified: Secondary | ICD-10-CM

## 2023-09-26 DIAGNOSIS — R82998 Other abnormal findings in urine: Secondary | ICD-10-CM

## 2023-09-26 MED ORDER — CIPROFLOXACIN HCL 500 MG PO TABS
500.0000 mg | ORAL_TABLET | Freq: Two times a day (BID) | ORAL | 0 refills | Status: AC
Start: 2023-09-26 — End: 2023-10-03

## 2023-09-26 NOTE — Progress Notes (Signed)
I've attempting contacting patient, main contact number picks up and hangs up the phone. I attempted contacting her sister and brother in law, no answer.

## 2023-09-30 ENCOUNTER — Encounter: Payer: Medicare Other | Admitting: Physical Therapy

## 2023-09-30 ENCOUNTER — Encounter (HOSPITAL_BASED_OUTPATIENT_CLINIC_OR_DEPARTMENT_OTHER): Payer: Self-pay | Admitting: Obstetrics

## 2023-09-30 ENCOUNTER — Ambulatory Visit: Payer: Medicare Other | Admitting: Podiatry

## 2023-09-30 ENCOUNTER — Telehealth: Payer: Self-pay | Admitting: Cardiology

## 2023-09-30 NOTE — Telephone Encounter (Signed)
Patient's caregiver Pascal Lux) called to request if patient can have a Virtual Visit on Thursday, 11/14 instead of an in-office visit.

## 2023-09-30 NOTE — Telephone Encounter (Signed)
Called patient to determine why she needed a virtual visit. Patient stated that she did not know who Pascal Lux was and she did not need to have a virtual visit. Instead she was planning to come to her appointment with Dr. Dulce Sellar on 11/14. Patient had no further questions at this time.

## 2023-10-02 ENCOUNTER — Ambulatory Visit: Payer: Medicare Other | Admitting: Podiatry

## 2023-10-02 NOTE — Progress Notes (Deleted)
Cardiology Office Note:    Date:  10/02/2023   ID:  [name] [name], DOB 1946-10-05, MRN [id number]  PCP:  Alinda Deem, MD  Cardiologist:  Norman Herrlich, MD    Referring MD: Alinda Deem, MD    ASSESSMENT:    1. Chronic atrial fibrillation (HCC)   2. Presence of Watchman left atrial appendage closure device   3. Hypertensive heart disease with chronic diastolic congestive heart failure (HCC)   4. Mild CAD   5. Mixed hyperlipidemia   6. Idiopathic cirrhosis (HCC)    PLAN:    In order of problems listed above:  ***   Next appointment: ***   Medication Adjustments/Labs and Tests Ordered: Current medicines are reviewed at length with the patient today.  Concerns regarding medicines are outlined above.  No orders of the defined types were placed in this encounter.  No orders of the defined types were placed in this encounter.    History of Present Illness:    [name] [name] is a 77 y.o. female with a hx of chronic permanent atrial fibrillation with Watchman device for stroke prevention hypertensive heart disease with chronic diastolic heart failure mild CAD obstructive sleep apnea hyperlipidemia and cirrhosis last seen 12/25/2022. Compliance with diet, lifestyle and medications: *** Past Medical History:  Diagnosis Date   Anxiety    Arthritis    Chest pain in adult 12/24/2018   CHF (congestive heart failure) (HCC)    Chronic atrial fibrillation (HCC) 04/24/2016   Watchman atrial appendage device placed at [facility] for clot pevention   Chronic diastolic heart failure (HCC)    Chronic liver disease 03/15/2016   Depression    Fibromyalgia    GERD (gastroesophageal reflux disease)    HTN (hypertension)    on medication since age 14   Hyperlipidemia 02/29/2016   Hypothyroidism 03/20/2016   Idiopathic cirrhosis (HCC)    stage 4; sees Dr. Charm Barges in [place]   Mild CAD 02/29/2016   Myocardial infarction [facility])    age 89   Neuropathy, peripheral    lower extremities    OSA on CPAP 07/04/2008   CPAP [facility]/ [facility]    Presence of Watchman left atrial appendage closure device 01/30/2018   Seasonal and perennial allergic rhinitis 01/23/2011   Allergy vaccine restarted at 1:50 08/02/2011 [facility], DC'd 2016    Simple chronic bronchitis (HCC) 12/26/2007   PFT 07/26/09-mild restriction and reduction of diffusion. No obstruction, no response to dilator    Somnolence    Type 2 diabetes mellitus (HCC) 12/29/2007   Qualifier: Diagnosis of  By: Maple Hudson MD, Clinton D    Urinary, incontinence, stress female    wears depends    Current Medications: No outpatient medications have been marked as taking for the 10/03/23 encounter (Appointment) with Baldo Daub, MD.      EKGs/Labs/Other Studies Reviewed:    The following studies were reviewed today:  Cardiac Studies & Procedures          CT SCANS  CT CORONARY MORPH W/CTA COR W/SCORE 01/27/2019  Addendum 01/27/2019  5:17 PM ADDENDUM REPORT: 01/27/2019 17:15  HISTORY: angina  EXAM: Cardiac/Coronary  CT  TECHNIQUE: The patient was scanned on a [employer].  PROTOCOL: A 120 kV prospective scan was triggered in the descending thoracic aorta at 111 HU's. Axial non-contrast 3 mm slices were carried out through the heart. The data set was analyzed on a dedicated work station and scored using the Agatson method. Gantry rotation speed was 250 msecs and collimation was .  6 mm. Beta blockade and 0.8 mg of sl NTG was given. The 3D data set was reconstructed in 5% intervals of the entire cardiac cycle due to atrial fibrillation. Diastolic phases were analyzed on a dedicated work station using MPR, MIP and VRT modes. The patient received 80mL OMNIPAQUE IOHEXOL 350 MG/ML SOLN of contrast.  FINDINGS: Coronary calcium score: The patient's coronary artery calcium score is 240, which places the patient in the 81 percentile.  Coronary arteries: Normal coronary origins.  Right dominance.  Right Coronary Artery:  Mild mixed atherosclerotic plaque in the proximal RCA, 25-49% stenosis.  Left Main Coronary Artery: Minimal mixed atherosclerotic plaque in the LM, <25% stenosis.  Left Anterior Descending Coronary Artery: Mild scattered mixed atherosclerotic plaque in the LAD, 25-49% stenosis.  Ramus intermedius: Small caliber ramus intermedius branch is patent.  Left Circumflex Artery: Possible mild atherosclerotic plaque the trifurcation of the first and second OM and the distal circumflex artery, 25-49% stenosis.  Aorta:  Normal size.  No calcifications.  No dissection.  Aortic Valve:  Trileaflet.  Mild annular calcifications.  Other findings:  Severe left atrial enlargement.  Normal pulmonary vein drainage into the left atrium.  Left atrial appendage occluder device in place. Contrast enters the LA appendage, suggesting incomplete occlusion of the LA appendage.  Normal size of the pulmonary artery.  Severe mitral annular calcification.  Patent foramen ovale vs. iatrogenic atrial septal left to right shunt s/p left atrial trans-septal puncture for placement of LA appendage occluder.  IMPRESSION: 1. The patient's coronary artery calcium score is 240, which places the patient in the 81 percentile.  2. Normal coronary origin with right dominance.  3. Mild CAD, CADRADS = 2. Mild disease in LAD and Left circumflex artery.  4. Left atrial appendage occluder device in place. Contrast enters the LA appendage, suggesting incomplete occlusion of the LA appendage. Cannot exclude thrombus in the LA appendage due to possible incomplete contrast opacification.   Electronically Signed By: Weston Brass On: 01/27/2019 17:15  Narrative EXAM: OVER-READ INTERPRETATION  CT CHEST  The following report is an over-read performed by radiologist Dr. Trudie Reed of [facility] on 01/27/2019. This over-read does not include interpretation of cardiac or coronary anatomy or  pathology. The coronary calcium score/coronary CTA interpretation by the cardiologist is attached.  COMPARISON:  None.  FINDINGS: Aortic atherosclerosis. Within the visualized portions of the thorax there are no suspicious appearing pulmonary nodules or masses, there is no acute consolidative airspace disease, no pleural effusions, no pneumothorax and no lymphadenopathy. Visualized portions of the upper abdomen demonstrates a shrunken appearance and nodular contour of the liver, indicative of advanced cirrhosis. There are no aggressive appearing lytic or blastic lesions noted in the visualized portions of the skeleton.  IMPRESSION: 1.  Aortic Atherosclerosis (ICD10-I70.0). 2. Cirrhosis.  Electronically Signed: By: Trudie Reed M.D. On: 01/27/2019 09:34              Recent Labs: No results found for requested labs within last 365 days.  Recent Lipid Panel    Component Value Date/Time   CHOL 117 12/24/2018 1137   TRIG 151 (H) 12/24/2018 1137   HDL 39 (L) 12/24/2018 1137   CHOLHDL 3.0 12/24/2018 1137   LDLCALC 48 12/24/2018 1137    Physical Exam:    VS:  There were no vitals taken for this visit.    Wt Readings from Last 3 Encounters:  09/09/23 189 lb 12.8 oz (86.1 kg)  05/29/23 188 lb (85.3 kg)  12/25/22 186 lb (84.4 kg)     GEN: *** Well nourished, well developed in no acute distress HEENT: Normal NECK: No JVD; No carotid bruits LYMPHATICS: No lymphadenopathy CARDIAC: ***RRR, no murmurs, rubs, gallops RESPIRATORY:  Clear to auscultation without rales, wheezing or rhonchi  ABDOMEN: Soft, non-tender, non-distended MUSCULOSKELETAL:  No edema; No deformity  SKIN: Warm and dry NEUROLOGIC:  Alert and oriented x 3 PSYCHIATRIC:  Normal affect    Signed, Norman Herrlich, MD  10/02/2023 8:07 PM    [facility]

## 2023-10-03 ENCOUNTER — Ambulatory Visit: Payer: Medicare Other | Attending: Cardiology | Admitting: Cardiology

## 2023-10-03 ENCOUNTER — Encounter (HOSPITAL_BASED_OUTPATIENT_CLINIC_OR_DEPARTMENT_OTHER): Payer: Self-pay | Admitting: Obstetrics

## 2023-10-03 ENCOUNTER — Other Ambulatory Visit: Payer: Self-pay

## 2023-10-03 DIAGNOSIS — E782 Mixed hyperlipidemia: Secondary | ICD-10-CM

## 2023-10-03 DIAGNOSIS — I2510 Atherosclerotic heart disease of native coronary artery without angina pectoris: Secondary | ICD-10-CM

## 2023-10-03 DIAGNOSIS — K7460 Unspecified cirrhosis of liver: Secondary | ICD-10-CM

## 2023-10-03 DIAGNOSIS — Z95818 Presence of other cardiac implants and grafts: Secondary | ICD-10-CM

## 2023-10-03 DIAGNOSIS — I482 Chronic atrial fibrillation, unspecified: Secondary | ICD-10-CM

## 2023-10-03 DIAGNOSIS — I110 Hypertensive heart disease with heart failure: Secondary | ICD-10-CM

## 2023-10-03 NOTE — Progress Notes (Signed)
Patient caregiver Margaretha Glassing called and said pt is feeling sick and is cancelling 10-04-2023 surgery, pt caregiver loretta given dr Olena Leatherwood number to cancel 10-04-23 surgery

## 2023-10-03 NOTE — Progress Notes (Addendum)
Spoke w/ via phone for pre-op interview---pt caregiver [name] [name] needs dos---- cbc, I stat, ekg        [name] results------ COVID test -----patient states asymptomatic no test needed Arrive at -------1200 10-04-2023 NPO after MN NO Solid Food.  Clear liquids from MN until---1100 Med rec completed Medications to take morning of surgery -----amlodipine, bupropion, carvedilol, gabapentin, levothyroxine, venlafaxine, omeprazole Diabetic medication -----none day of surgery Patient instructed no nail polish to be worn day of surgery Patient instructed to bring photo id and insurance card day of surgery Patient aware to have Driver (ride )  [employer] caregiver/  pt has 24 hour four caregivers    for 24 hours after surgery   Patient Special Instructions -----none Pre-Op special Instructions -----none Patient verbalized understanding of instructions that were given at this phone interview. Patient denies chest pain, sob, fever, cough at the interview.   Lov dr [facility]  cardiology lov 10-03-2023 epic

## 2023-10-04 ENCOUNTER — Ambulatory Visit (HOSPITAL_BASED_OUTPATIENT_CLINIC_OR_DEPARTMENT_OTHER): Admission: RE | Admit: 2023-10-04 | Payer: Medicare Other | Source: Home / Self Care | Admitting: Obstetrics

## 2023-10-04 DIAGNOSIS — Z01818 Encounter for other preprocedural examination: Secondary | ICD-10-CM

## 2023-10-04 DIAGNOSIS — R310 Gross hematuria: Secondary | ICD-10-CM

## 2023-10-04 DIAGNOSIS — N390 Urinary tract infection, site not specified: Secondary | ICD-10-CM

## 2023-10-04 HISTORY — DX: Dependence on other enabling machines and devices: Z99.89

## 2023-10-04 HISTORY — DX: Unspecified hearing loss, unspecified ear: H91.90

## 2023-10-04 SURGERY — CYSTOSCOPY
Anesthesia: Monitor Anesthesia Care

## 2023-10-07 ENCOUNTER — Encounter: Payer: Medicare Other | Admitting: Physical Therapy

## 2023-10-14 ENCOUNTER — Encounter: Payer: Medicare Other | Admitting: Physical Therapy

## 2023-10-14 ENCOUNTER — Ambulatory Visit: Payer: Medicare Other | Admitting: Podiatry

## 2023-10-16 ENCOUNTER — Other Ambulatory Visit: Payer: Self-pay | Admitting: Cardiology

## 2023-10-19 DIAGNOSIS — E1142 Type 2 diabetes mellitus with diabetic polyneuropathy: Secondary | ICD-10-CM | POA: Diagnosis not present

## 2023-10-19 DIAGNOSIS — M150 Primary generalized (osteo)arthritis: Secondary | ICD-10-CM | POA: Diagnosis not present

## 2023-10-19 DIAGNOSIS — Z79899 Other long term (current) drug therapy: Secondary | ICD-10-CM | POA: Diagnosis not present

## 2023-10-19 DIAGNOSIS — I10 Essential (primary) hypertension: Secondary | ICD-10-CM | POA: Diagnosis not present

## 2023-10-21 ENCOUNTER — Encounter: Payer: Medicare Other | Admitting: Physical Therapy

## 2023-10-21 NOTE — Telephone Encounter (Signed)
Rx refill sent to pharmacy. 

## 2023-10-22 ENCOUNTER — Ambulatory Visit: Payer: Medicare Other | Admitting: Podiatry

## 2023-10-22 ENCOUNTER — Encounter (HOSPITAL_BASED_OUTPATIENT_CLINIC_OR_DEPARTMENT_OTHER): Payer: Self-pay | Admitting: Obstetrics

## 2023-10-22 NOTE — Progress Notes (Addendum)
Spoke w/ via phone for pre-op interview--- pt Lab needs dos----  istat, cmp, ekg       Lab results------  no COVID test -----patient states asymptomatic no test needed Arrive at -------  1130 on 10-23-2023 NPO after MN NO Solid Food.  Clear liquids from MN until--- 1030 Med rec completed Medications to take morning of surgery -----  norvasc, coreg, gabapentin, synthroid, effexor, prilosec, wellbutrin  Diabetic medication -----  do not take metformin morning of surgery Patient instructed no nail polish to be worn day of surgery Patient instructed to bring photo id and insurance card day of surgery Patient aware to have Driver (ride )-- sister, [name] [name] / caregiver for 24 hours after surgery -  per pt she has regular caregivers in her home 24/7 that will be caring for her Patient Special Instructions -----  n/a Pre-Op special Instructions -----   pt walks w/ walker due to bilateral foot drop/ uses braces.  Pt stated is able to dress self, urinary incontinent,  hard to write due to swelling/ pain due to arthritis.  Pt likes her sister to sign documents whom has HCPOA and POA.  Pt stated her sister, [name] [name] does have documents, she verbalized understanding these documents needed to brought in [place] in order for her sister to sign for her and for them to get scanned in epic. Pt has Libre 2 on right upper arm. Patient verbalized understanding of instructions that were given at this phone interview. Patient denies chest pain, sob, fever, cough at the interview.    Anesthesia Review:  HTN/  Chronic AFib no anticoagulation due to chronic cirrhosis s/p watchmen appendage closure device 05/ 2017;  hx MI 1999 secondary to spasm;  mild CAD nonobstructive ;  simple chronic bronchitis;  severe OSA no cpap intolerant ,  pt stated is being scheduled to get study/ titrate again;  DM2;  non-alcoholic cirrhosis;  thrombocytopenia Pt denies cardiac s&s, sob with want activity she can do, and no peripheral swelling  today.  PCP:  Dr Demetrius Charity. [place] Cardiologist :  Dr Dulce Sellar [name] 12-25-2022) GI:  Dr Charm Barges in [place] Endocrinologist:  Dr Sharl Ma Pulmonary:  Dr C. Young (09-09-2023) Chest x-ray : 01-23-2021 EKG :  06-04-2022 Echo : 05-03-2021 Stress test:  05-03-2010 printed copy w/ chart , no ischemia w/ normal perfusion, nuclear ef 60% Cardiac Cath : 1999 @ [facility] in setting MI secondary to spasm (no available) Activity level:   see above Sleep Study/ CPAP :  yes/ no Fasting Blood Sugar :   150-160   / Checks Blood Sugar -- times a day:  multiple times Blood Thinner/ Instructions /Last Dose:  no ASA / Instructions/ Last Dose :  no

## 2023-10-23 DIAGNOSIS — R5383 Other fatigue: Secondary | ICD-10-CM | POA: Diagnosis not present

## 2023-10-23 DIAGNOSIS — G4733 Obstructive sleep apnea (adult) (pediatric): Secondary | ICD-10-CM | POA: Diagnosis not present

## 2023-10-23 DIAGNOSIS — R400 Somnolence: Secondary | ICD-10-CM | POA: Diagnosis not present

## 2023-10-23 DIAGNOSIS — J4520 Mild intermittent asthma, uncomplicated: Secondary | ICD-10-CM | POA: Diagnosis not present

## 2023-10-24 ENCOUNTER — Encounter (HOSPITAL_BASED_OUTPATIENT_CLINIC_OR_DEPARTMENT_OTHER): Payer: Self-pay | Admitting: Obstetrics

## 2023-10-24 ENCOUNTER — Ambulatory Visit (HOSPITAL_BASED_OUTPATIENT_CLINIC_OR_DEPARTMENT_OTHER): Payer: Medicare Other | Admitting: Anesthesiology

## 2023-10-24 ENCOUNTER — Encounter (HOSPITAL_BASED_OUTPATIENT_CLINIC_OR_DEPARTMENT_OTHER)
Admission: RE | Disposition: A | Discharge status: Home / Self Care | Payer: Self-pay | Source: Home / Self Care | Attending: Obstetrics

## 2023-10-24 ENCOUNTER — Ambulatory Visit (HOSPITAL_BASED_OUTPATIENT_CLINIC_OR_DEPARTMENT_OTHER)
Admission: RE | Admit: 2023-10-24 | Discharge: 2023-10-24 | Disposition: A | Discharge status: Home / Self Care | Payer: Medicare Other | Attending: Obstetrics | Admitting: Obstetrics

## 2023-10-24 DIAGNOSIS — I509 Heart failure, unspecified: Secondary | ICD-10-CM | POA: Diagnosis not present

## 2023-10-24 DIAGNOSIS — N3091 Cystitis, unspecified with hematuria: Secondary | ICD-10-CM | POA: Diagnosis not present

## 2023-10-24 DIAGNOSIS — E1142 Type 2 diabetes mellitus with diabetic polyneuropathy: Secondary | ICD-10-CM | POA: Insufficient documentation

## 2023-10-24 DIAGNOSIS — I252 Old myocardial infarction: Secondary | ICD-10-CM | POA: Diagnosis not present

## 2023-10-24 DIAGNOSIS — I5032 Chronic diastolic (congestive) heart failure: Secondary | ICD-10-CM | POA: Diagnosis not present

## 2023-10-24 DIAGNOSIS — N329 Bladder disorder, unspecified: Secondary | ICD-10-CM | POA: Diagnosis present

## 2023-10-24 DIAGNOSIS — R310 Gross hematuria: Secondary | ICD-10-CM | POA: Insufficient documentation

## 2023-10-24 DIAGNOSIS — I110 Hypertensive heart disease with heart failure: Secondary | ICD-10-CM | POA: Diagnosis not present

## 2023-10-24 DIAGNOSIS — N3281 Overactive bladder: Secondary | ICD-10-CM | POA: Insufficient documentation

## 2023-10-24 DIAGNOSIS — E1151 Type 2 diabetes mellitus with diabetic peripheral angiopathy without gangrene: Secondary | ICD-10-CM | POA: Diagnosis not present

## 2023-10-24 DIAGNOSIS — Z87440 Personal history of urinary (tract) infections: Secondary | ICD-10-CM | POA: Insufficient documentation

## 2023-10-24 DIAGNOSIS — Z794 Long term (current) use of insulin: Secondary | ICD-10-CM | POA: Insufficient documentation

## 2023-10-24 DIAGNOSIS — K7460 Unspecified cirrhosis of liver: Secondary | ICD-10-CM | POA: Insufficient documentation

## 2023-10-24 DIAGNOSIS — I781 Nevus, non-neoplastic: Secondary | ICD-10-CM | POA: Insufficient documentation

## 2023-10-24 DIAGNOSIS — N390 Urinary tract infection, site not specified: Secondary | ICD-10-CM

## 2023-10-24 DIAGNOSIS — I482 Chronic atrial fibrillation, unspecified: Secondary | ICD-10-CM | POA: Diagnosis not present

## 2023-10-24 DIAGNOSIS — Z90710 Acquired absence of both cervix and uterus: Secondary | ICD-10-CM | POA: Insufficient documentation

## 2023-10-24 DIAGNOSIS — Z01818 Encounter for other preprocedural examination: Secondary | ICD-10-CM

## 2023-10-24 HISTORY — DX: Unspecified cirrhosis of liver: K74.60

## 2023-10-24 HISTORY — PX: CYSTOSCOPY: SHX5120

## 2023-10-24 HISTORY — DX: Foot drop, right foot: M21.371

## 2023-10-24 HISTORY — DX: Thrombocytopenia, unspecified: D69.6

## 2023-10-24 HISTORY — DX: Localized edema: R60.0

## 2023-10-24 HISTORY — DX: Major depressive disorder, single episode, unspecified: F32.9

## 2023-10-24 HISTORY — DX: Foot drop, right foot: M21.372

## 2023-10-24 HISTORY — DX: Mixed hyperlipidemia: E78.2

## 2023-10-24 HISTORY — DX: Irritable bowel syndrome with diarrhea: K58.0

## 2023-10-24 HISTORY — DX: Type 2 diabetes mellitus without complications: E11.9

## 2023-10-24 HISTORY — DX: Old myocardial infarction: I25.2

## 2023-10-24 LAB — POCT I-STAT, CHEM 8
BUN: 10 mg/dL (ref 8–23)
Calcium, Ion: 1.14 mmol/L — ABNORMAL LOW (ref 1.15–1.40)
Chloride: 101 mmol/L (ref 98–111)
Creatinine, Ser: 0.8 mg/dL (ref 0.44–1.00)
Glucose, Bld: 163 mg/dL — ABNORMAL HIGH (ref 70–99)
HCT: 37 % (ref 36.0–46.0)
Hemoglobin: 12.6 g/dL (ref 12.0–15.0)
Potassium: 3.8 mmol/L (ref 3.5–5.1)
Sodium: 138 mmol/L (ref 135–145)
TCO2: 23 mmol/L (ref 22–32)

## 2023-10-24 LAB — TYPE AND SCREEN
ABO/RH(D): A POS
Antibody Screen: NEGATIVE

## 2023-10-24 LAB — GLUCOSE, CAPILLARY: Glucose-Capillary: 206 mg/dL — ABNORMAL HIGH (ref 70–99)

## 2023-10-24 SURGERY — CYSTOSCOPY
Anesthesia: General | Site: Bladder

## 2023-10-24 MED ORDER — POVIDONE-IODINE 10 % EX SWAB: 2.0000 | Freq: Once | CUTANEOUS | Status: DC

## 2023-10-24 MED ORDER — GABAPENTIN 300 MG PO CAPS: 300.0000 mg | ORAL_CAPSULE | ORAL | Status: DC

## 2023-10-24 MED ORDER — CLINDAMYCIN PHOSPHATE 900 MG/50ML IV SOLN
INTRAVENOUS | Status: DC | PRN
  Administered 2023-10-24: 900 mg via INTRAVENOUS

## 2023-10-24 MED ORDER — CLINDAMYCIN PHOSPHATE 900 MG/50ML IV SOLN
INTRAVENOUS | Status: AC
  Filled 2023-10-24: qty 50

## 2023-10-24 MED ORDER — PROPOFOL 10 MG/ML IV BOLUS
INTRAVENOUS | Status: DC | PRN
  Administered 2023-10-24 (×3): 100 mg via INTRAVENOUS

## 2023-10-24 MED ORDER — ONDANSETRON HCL 4 MG/2ML IJ SOLN
INTRAMUSCULAR | Status: DC | PRN
  Administered 2023-10-24: 4 mg via INTRAVENOUS

## 2023-10-24 MED ORDER — FENTANYL CITRATE (PF) 100 MCG/2ML IJ SOLN
INTRAMUSCULAR | Status: DC | PRN
  Administered 2023-10-24 (×2): 25 ug via INTRAVENOUS
  Administered 2023-10-24: 50 ug via INTRAVENOUS

## 2023-10-24 MED ORDER — GENTAMICIN SULFATE 40 MG/ML IJ SOLN
320.0000 mg | INTRAVENOUS | Status: AC
  Administered 2023-10-24: 320 mg via INTRAVENOUS
  Filled 2023-10-24: qty 8

## 2023-10-24 MED ORDER — CLINDAMYCIN PHOSPHATE 900 MG/50ML IV SOLN: 900.0000 mg | INTRAVENOUS | Status: DC

## 2023-10-24 MED ORDER — FENTANYL CITRATE (PF) 100 MCG/2ML IJ SOLN: 25.0000 ug | INTRAMUSCULAR | Status: DC | PRN

## 2023-10-24 MED ORDER — LIDOCAINE 2% (20 MG/ML) 5 ML SYRINGE
INTRAMUSCULAR | Status: DC | PRN
  Administered 2023-10-24: 60 mg via INTRAVENOUS

## 2023-10-24 MED ORDER — OXYCODONE HCL 5 MG PO TABS: 5.0000 mg | ORAL_TABLET | Freq: Once | ORAL | Status: DC | PRN

## 2023-10-24 MED ORDER — ACETAMINOPHEN 500 MG PO TABS
ORAL_TABLET | ORAL | Status: AC
  Filled 2023-10-24: qty 2

## 2023-10-24 MED ORDER — ONDANSETRON HCL 4 MG/2ML IJ SOLN: 4.0000 mg | Freq: Once | INTRAMUSCULAR | Status: DC | PRN

## 2023-10-24 MED ORDER — SODIUM CHLORIDE 0.9 % IV SOLN: INTRAVENOUS | Status: DC

## 2023-10-24 MED ORDER — PROPOFOL 10 MG/ML IV BOLUS
INTRAVENOUS | Status: AC
Start: 2023-10-24 — End: ?
  Filled 2023-10-24: qty 20

## 2023-10-24 MED ORDER — FENTANYL CITRATE (PF) 100 MCG/2ML IJ SOLN
INTRAMUSCULAR | Status: AC
  Filled 2023-10-24: qty 2

## 2023-10-24 MED ORDER — LIDOCAINE HCL (PF) 2 % IJ SOLN
INTRAMUSCULAR | Status: AC
  Filled 2023-10-24: qty 5

## 2023-10-24 MED ORDER — DEXAMETHASONE SODIUM PHOSPHATE 4 MG/ML IJ SOLN
INTRAMUSCULAR | Status: DC | PRN
  Administered 2023-10-24: 5 mg via INTRAVENOUS

## 2023-10-24 MED ORDER — OXYCODONE HCL 5 MG/5ML PO SOLN: 5.0000 mg | Freq: Once | ORAL | Status: DC | PRN

## 2023-10-24 MED ORDER — PROPOFOL 10 MG/ML IV BOLUS
INTRAVENOUS | Status: AC
  Filled 2023-10-24: qty 20

## 2023-10-24 MED ORDER — SODIUM CHLORIDE 0.9 % IR SOLN
Status: DC | PRN
  Administered 2023-10-24: 3000 mL via INTRAVESICAL

## 2023-10-24 MED ORDER — ACETAMINOPHEN 500 MG PO TABS
1000.0000 mg | ORAL_TABLET | ORAL | Status: AC
  Administered 2023-10-24: 1000 mg via ORAL

## 2023-10-24 SURGICAL SUPPLY — 11 items
DRSG TELFA 3X8 NADH STRL (GAUZE/BANDAGES/DRESSINGS) IMPLANT
ELECT REM PT RETURN 9FT ADLT (ELECTROSURGICAL)
ELECTRODE REM PT RTRN 9FT ADLT (ELECTROSURGICAL) IMPLANT
GLOVE BIOGEL PI IND STRL 6.5 (GLOVE) ×1 IMPLANT
GLOVE ECLIPSE 6.0 STRL STRAW (GLOVE) ×1 IMPLANT
GOWN STRL REUS W/TWL LRG LVL3 (GOWN DISPOSABLE) ×1 IMPLANT
KIT TURNOVER CYSTO (KITS) ×1 IMPLANT
MANIFOLD NEPTUNE II (INSTRUMENTS) ×1 IMPLANT
NDL SAFETY ECLIPSE 18X1.5 (NEEDLE) IMPLANT
PACK CYSTO (CUSTOM PROCEDURE TRAY) ×1 IMPLANT
SLEEVE SCD COMPRESS KNEE MED (STOCKING) ×1 IMPLANT

## 2023-10-24 NOTE — Op Note (Addendum)
Operative Note  Preoperative Diagnosis: bladder lesion, recurrent UTI, gross hematuria  Postoperative Diagnosis: bladder lesion, recurrent UTI, gross hematuria  Procedures performed:  Cystourethroscopy, bladder biopsy, fulguration of bladder mucosa  Implants: None  Attending Surgeon: Madaline Brilliant MD  Assistant Surgeon: None  @APPASSISTANT @  Anesthesia: General LMA  Findings: Erythema at bladder dome with abnormal  inflammation and polypoid lesion over lying left bladder dome, abnormal bladder mucosa with white central ulceration noted lateral to left ureteral orifice.  Biopsy taken from bladder dome superior to left ureteral orifice.  Specimens:  ID Type Source Tests Collected by Time Destination  1 : bladder biopsy Tissue PATH GU resection / TURBT / partial nephrectomy SURGICAL PATHOLOGY Wyatt Haste T, MD 10/24/2023 1406     Estimated blood loss: 5 mL  IV fluids: 400 mL  Urine output: 50 mL  Complications: None  Procedure in Detail:  CYSTOSCOPY  A time out was performed.  The periurethral area was prepped and draped in a sterile manner.  The urethra and bladder visualized with a 70-degree scope.  She had diminished urethral coaptation and normal appearing urethral mucosa  She had abnormal bladder mucosa with significant erythema at bladder dome with limited visualization. Inflammation and polypoid lesion over lying left bladder dome, abnormal bladder mucosa with white central ulceration noted lateral to left ureteral orifice with the appearance of Hunner ulcer. Several biopsies were taken from the bladder dome superior to left ureteral orifice. A bugbee electrode was advanced under direct visualization to fulgurate biopsy sites to ensure hemostasis. Unable to visualize bilateral clear efflux from both ureteral orifices due to erythema limiting visualization.  She had no diverticuli or stones noted.  Media Information      Media Information   Document  Information  Photos    10/24/2023 14:26  Attached To:  Hospital Encounter on 10/24/23  Source Information  Loleta Chance, MD  [facility]  Document History     ASSESSMENT:  77 y.o. with gross hematuria, recurrent UTI, and bladder pain. Cystoscopy today is abnormal  with biopsy obtained .  PLAN:  Follow-up to discuss findings, reviewed intraoperative findings with Vernona Rieger (sister-in-law) per pt request. Discussed likely urology referral pending pathology. All questions answered and post-procedures instructions were given  Loleta Chance, MD

## 2023-10-24 NOTE — Transfer of Care (Signed)
Immediate Anesthesia Transfer of Care Note  Patient: [name] [name]  Procedure(s) Performed: Procedure(s) (LRB): CYSTOSCOPY WITH BLADDER BIOPSY (N/A)  Patient Location: PACU  Anesthesia Type: GA  Level of Consciousness: awake, sedated, patient cooperative and responds to stimulation  Airway & Oxygen Therapy: Patient Spontanous Breathing and Patient connected to [place] oxygen  Post-op Assessment: Report given to PACU RN, Post -op Vital signs reviewed and stable and Patient moving all extremities  Post vital signs: Reviewed and stable  Complications: No apparent anesthesia complications

## 2023-10-24 NOTE — Anesthesia Preprocedure Evaluation (Addendum)
Anesthesia Evaluation  Patient identified by MRN, date of birth, ID band Patient awake    Reviewed: Allergy & Precautions, H&P , NPO status , Patient's Chart, lab work & pertinent test results  Airway Mallampati: III  TM Distance: <3 FB Neck ROM: Full    Dental no notable dental hx.    Pulmonary sleep apnea    Pulmonary exam normal breath sounds clear to auscultation       Cardiovascular hypertension, Pt. on medications + Past MI and +CHF  Normal cardiovascular exam Rhythm:Regular Rate:Normal     Neuro/Psych negative neurological ROS  negative psych ROS   GI/Hepatic negative GI ROS,,,(+) Cirrhosis         Endo/Other  diabetes, Type 2, Insulin DependentHypothyroidism    Renal/GU negative Renal ROS  negative genitourinary   Musculoskeletal negative musculoskeletal ROS (+)    Abdominal   Peds negative pediatric ROS (+)  Hematology  (+) Blood dyscrasia Platelets <100K   Anesthesia Other Findings   Reproductive/Obstetrics negative OB ROS                             Anesthesia Physical Anesthesia Plan  ASA: 3  Anesthesia Plan: General   Post-op Pain Management: Minimal or no pain anticipated   Induction: Intravenous  PONV Risk Score and Plan: 3 and Ondansetron, Treatment may vary due to age or medical condition and TIVA  Airway Management Planned: LMA  Additional Equipment:   Intra-op Plan:   Post-operative Plan: Extubation in OR  Informed Consent: I have reviewed the patients History and Physical, chart, labs and discussed the procedure including the risks, benefits and alternatives for the proposed anesthesia with the patient or authorized representative who has indicated his/her understanding and acceptance.     Dental advisory given  Plan Discussed with: CRNA and Surgeon  Anesthesia Plan Comments: (No midazolam)       Anesthesia Quick Evaluation

## 2023-10-24 NOTE — Anesthesia Procedure Notes (Signed)
Procedure Name: LMA Insertion Date/Time: 10/24/2023 1:49 PM  Performed by: Jessica Priest, CRNAPre-anesthesia Checklist: Patient identified, Emergency Drugs available, Suction available, Patient being monitored and Timeout performed Patient Re-evaluated:Patient Re-evaluated prior to induction Oxygen Delivery Method: Circle system utilized Preoxygenation: Pre-oxygenation with 100% oxygen Induction Type: IV induction Ventilation: Mask ventilation without difficulty LMA: LMA inserted LMA Size: 4.0 Number of attempts: 1 Airway Equipment and Method: Bite block Placement Confirmation: positive ETCO2, breath sounds checked- equal and bilateral and CO2 detector Tube secured with: Tape Dental Injury: Teeth and Oropharynx as per pre-operative assessment

## 2023-10-24 NOTE — Progress Notes (Signed)
Client is alert and oriented, denies any pain or discomfort, no issues with voiding, no complaints of dysuria, urgency or frequency. No drainage noted on peripad upon dc time. Tolerating POs well. CBG assessed prior to DC from facility as well. Able to ambulate with assistance or use of personal walker. States she is ready to be dc to home, pt and family states she has 24 hours home care.

## 2023-10-24 NOTE — Discharge Instructions (Addendum)
Taking Care of Yourself after Cystoscopy and bladder biopsy  Drink plenty of water for a day or two following your procedure. Try to have about 8 ounces (one cup) at a time, and do this 6 times or more per day unless you have fluid restrictitons AVOID irritative beverages such as coffee, tea, soda, alcoholic or citrus drinks for a day or two, as this may cause burning with urination.  For the first 1-2 days after the procedure, your urine may be pink or red in color. You may have some blood in your urine as a normal side effect of the procedure. Large amounts of bleeding or difficulty urinating are NOT normal. Call the nurse line if this happens or go to the nearest Emergency Room if the bleeding is heavy or you cannot urinate at all and it is after hours. If you had a Bulkamid injection in the urethra and need to be catheterized, ask for a pediatric catheter to be used (size 10 or 12-French) so the material is not pushed out of place.   You may experience some discomfort or a burning sensation with urination after having this procedure. You can use over the counter Azo or pyridium to help with burning and follow the instructions on the packaging. If it does not improve within 1-2 days, or other symptoms appear (fever, chills, or difficulty urinating) call the office to speak to a nurse.  You may return to normal daily activities such as work, school, driving, exercising and housework on the day of the procedure. If your doctor gave you a prescription, take it as ordered.    Post op concerns  For non-emergent issues, please call the Urogynecology Nurse. Please leave a message and someone will contact you within one business day.  You can also send a message through MyChart.   AFTER HOURS (After 5:00 PM and on weekends):  For urgent matters that cannot wait until the next business day. Call our office [phone] and connect to the doctor on call.  Please reserve this for important issues.   **FOR  ANY TRUE EMERGENCY ISSUES CALL 911 OR GO TO THE NEAREST EMERGENCY ROOM.** Please inform our office or the doctor on call of any emergency.     APPOINTMENTS: Call [phone]  Post Anesthesia Home Care Instructions  Activity: Get plenty of rest for the remainder of the day. A responsible individual must stay with you for 24 hours following the procedure.  For the next 24 hours, DO NOT: -Drive a car -Advertising copywriter -Drink alcoholic beverages -Take any medication unless instructed by your physician -Make any legal decisions or sign important papers.  Meals: Start with liquid foods such as gelatin or soup. Progress to regular foods as tolerated. Avoid greasy, spicy, heavy foods. If nausea and/or vomiting occur, drink only clear liquids until the nausea and/or vomiting subsides. Call your physician if vomiting continues.  Special Instructions/Symptoms: Your throat may feel dry or sore from the anesthesia or the breathing tube placed in your throat during surgery. If this causes discomfort, gargle with warm salt water. The discomfort should disappear within 24 hours.

## 2023-10-24 NOTE — Anesthesia Postprocedure Evaluation (Signed)
Anesthesia Post Note  Patient: [name] [name]  Procedure(s) Performed: CYSTOSCOPY WITH BLADDER BIOPSY (Bladder)     Patient location during evaluation: PACU Anesthesia Type: General Level of consciousness: awake and alert Pain management: pain level controlled Vital Signs Assessment: post-procedure vital signs reviewed and stable Respiratory status: spontaneous breathing, nonlabored ventilation, respiratory function stable and patient connected to nasal cannula oxygen Cardiovascular status: blood pressure returned to baseline and stable Postop Assessment: no apparent nausea or vomiting Anesthetic complications: no  No notable events documented.  Last Vitals:  Vitals:   10/24/23 1224 10/24/23 1429  BP: (!) 149/76 109/67  Pulse: 77   Resp: 17   Temp: (!) 36.3 C 36.6 C  SpO2: 96% 95%    Last Pain:  Vitals:   10/24/23 1430  TempSrc:   PainSc: 0-No pain                 Trevionne Advani S

## 2023-10-24 NOTE — H&P (Signed)
@LOGO @ New Patient Evaluation and Consultation  Referring Provider: No ref. provider found PCP: Alinda Deem, MD Date of Service: 10/10/2023  SUBJECTIVE  [name] [name] is a 77 y.o. White or Caucasian female who presents for bladder biopsy due to bladder dome lesion noted on 09/23/23.    History of recurrent UTI, bladder pain, OAB, dysuria, intermittent vaginal bleeding vs. hematuria s/p hysterectomy, and bowel leakage.  Urine culture 09/23/23 with E. Coli resistant to ampicillin, augmentin, macrobid and bactrim. Treated with Cipro.  Prior cystoscopy with bladder biopsy cancelled on 10/04/23 due to URI.  Using vaginal estrogen with some relief and tried bladder instillation x 1 on 07/30/23. Second bladder instillation cancelled due to hypotension. Last seen by Dr. Logan Bores on 01/21/23 and underwent cystoscopy. Per chart review: "Urethra - Normal Bladder - Normal mucosa, ureters normal in configuration and locationpt has significant debris in bladder and had stress incontinence  PVR 0 Flow peak 17 No obstruction  Patient tolerated procedure"  Review of records significant for: History of A. Fib s/p watchman, CHF, cirrhosis, fibromyalgia, history of MI, recurrent UTI, OSA, idiopathic cirrhosis, peripheral neuropathy, splenomegaly   Susceptibility data from last 90 days. Collected Specimen Info Organism AMPICILLIN AMPICILLIN/SULBACTAM CEFAZOLIN CEFEPIME CEFTRIAXONE Ciprofloxacin Clindamycin Gentamicin Susc lslt Imipenem Inducible Clindamycin Nitrofurantoin Susc lslt Oxacillin  09/23/23 Urine, Random Escherichia coli  R  R  S  S  S  S   S  S   R   08/06/23 Urine, Random Escherichia coli  R  I  S  S  S  S   S  S   R     Staphylococcus haemolyticus       R  S  S   S  S  R   Collected Specimen Info Organism Piperacillin + Tazobactam Rifampin TELAVANCIN TETRACYCLINE Trimethoprim/Sulfa  09/23/23 Urine, Random Escherichia coli  S     R  08/06/23 Urine, Random Escherichia coli  S     R     Staphylococcus haemolyticus   S  S  S  R   HM Colonoscopy          Discontinued - Colonoscopy  Discontinued      Frequency changed to Never automatically (Topic No Longer Applies)   08/26/2013  Outside Claim: [place] COLONOSCOPY,RE[facility] LESN,SNARE   Only the first 1 history entries have been loaded, but more history exists.           Past Medical History:  Past Medical History:  Diagnosis Date   Acquired bilateral foot drop    use braces   Arthritis    Chronic atrial fibrillation [facility])    cardiologist--- dr Dulce Sellar;  pt not on anticoagulation ,  03-21-2016  s/p  watchmen left atrial appendage closure device   Chronic diastolic heart failure (HCC)    followed by cardiology   Cirrhosis, non-alcoholic (HCC)    followed by GI ;   Dr. Charm Barges in [place]   Edema of both lower extremities    Fibromyalgia    History of MI (myocardial infarction)    age 89   HOH (hard of hearing)    per pt residual post covid 2021   HTN (hypertension)    on medication since age 6   Hyperlipidemia, mixed    Hypothyroidism    Irritable bowel syndrome with diarrhea    MDD (major depressive disorder)    Mild CAD 02/29/2016   CCT morph/ CTA---   01-27-2019   score= 240 involving LAD/ LCx  nonobstructive  Neuropathy, peripheral    lower extremities   OSA (obstructive sleep apnea)    followed by dr c. young;   (10-22-2023  lov note in epic 09-09-2023  pt is getting set up to titrate due to intolerate to current cpap)   Presence of Watchman left atrial appendage closure device 03/21/2016   done at [facility] by dr j. Hurman Horn due to pt high risk anticoagulation secondary to chronic cirrhosis   Seasonal and perennial allergic rhinitis    Somnolence    Thrombocytopenia (HCC)    Type 2 diabetes mellitus treated with insulin [facility])    endocrinologist-- dr Sharl Ma   (10-22-2023  checks multiple times w/ CGM (Libre 2)  fasting average  150-160)   Urinary, incontinence, stress female    wears depends   Uses walker     due to bilateral foot drop     Past Surgical History:   Past Surgical History:  Procedure Laterality Date   APPENDECTOMY  1998   open   CARDIAC CATHETERIZATION  1999   @[facility]    CARPAL TUNNEL RELEASE Right 1985   CATARACT EXTRACTION W/ INTRAOCULAR LENS  IMPLANT, BILATERAL Bilateral 2017   DILATION AND CURETTAGE OF UTERUS     yrs ago   KNEE ARTHROSCOPY Right 02/17/2004   @[facility]  by dr whitfield   LEFT ATRIAL APPENDAGE OCCLUSION  03/21/2016   @[facility] by dr Shela Commons. Hurman Horn;   closure device   POSTERIOR LUMBAR FUSION  04/11/2011   @[facility]   by dr Newell Coral;   L4--5   laminectomy/  disectomy/  decompression   POSTERIOR LUMBAR FUSION  01/24/2012   @[facility]  by dr Newell Coral;   L5--S1;   laminectomy/ decompression/  disectomy   TONSILLECTOMY AND ADENOIDECTOMY  1969   TOTAL ABDOMINAL HYSTERECTOMY W/ BILATERAL SALPINGOOPHORECTOMY Bilateral 1998     Past OB/GYN History: OB History  Gravida Para Term Preterm AB Living  2 2 2     2   SAB IAB Ectopic Multiple Live Births               # Outcome Date GA Lbr Len/2nd Weight Sex Type Anes PTL Lv  2 Term      Vag-Spont     1 Term      Vag-Spont       Medications: Patient @[facility]EDP @   Allergies: Patient is allergic to azithromycin, celebrex [celecoxib], codeine, hydralazine hcl, sulfa antibiotics, byetta 10 mcg pen [exenatide], ceftin [cefuroxime], ciprofloxacin, invokana [canagliflozin], jardiance [empagliflozin], and norvasc [amlodipine besylate].   Social History:  Social History   Tobacco Use   Smoking status: Never   Smokeless tobacco: Never  Vaping Use   Vaping status: Never Used  Substance Use Topics   Alcohol use: No   Drug use: Never    Family History:   Family History  Problem Relation Age of Onset   Emphysema Mother    Rheum arthritis Mother    Cancer Mother        uterine, cervical, vaginal   Anesthesia problems Mother    Heart attack Father    Stroke Father    CAD Father    Hypertension Father    Lupus Sister    Diabetes  Paternal Grandmother      Review of Systems: Review of Systems  Constitutional:  Negative for fever, malaise/fatigue and weight loss.  Respiratory:  Negative for cough, shortness of breath and wheezing.   Cardiovascular:  Negative for chest pain, palpitations and leg swelling.  Gastrointestinal:  Negative for abdominal pain and blood  in stool.       Leakage  Genitourinary:  Positive for urgency. Negative for dysuria, frequency and hematuria.       Blood in urine, leakage  Skin:  Negative for rash.  Neurological:  Negative for dizziness, weakness and headaches.  Endo/Heme/Allergies:  Bruises/bleeds easily.     OBJECTIVE Physical Exam: There were no vitals filed for this visit.  Physical Exam Constitutional:      General: She is not in acute distress.    Appearance: Normal appearance.  Cardiovascular:     Rate and Rhythm: Normal rate.  Pulmonary:     Effort: Pulmonary effort is normal. No respiratory distress.  Abdominal:     General: There is no distension.     Palpations: There is no mass.     Tenderness: There is no abdominal tenderness.     Hernia: No hernia is present.  Neurological:     Mental Status: She is alert.  Vitals reviewed. Exam conducted with a chaperone present.   GU exam deferred for OR  Laboratory Results: Lab Results  Component Value Date   COLORU yellow 09/23/2023   CLARITYU turbid 09/23/2023   GLUCOSEUR Negative 09/23/2023   BILIRUBINUR negative 09/23/2023   KETONESU small 09/23/2023   SPECGRAV >=1.030 (A) 09/23/2023   RBCUR Large 09/23/2023   PHUR 6.0 09/23/2023   PROTEINUR Positive (A) 09/23/2023   UROBILINOGEN 1.0 09/23/2023   LEUKOCYTESUR Moderate (2+) (A) 09/23/2023    Lab Results  Component Value Date   CREATININE 0.87 01/20/2019   CREATININE 1.07 (H) 12/25/2018   CREATININE 1.00 12/24/2018    No results found for: "HGBA1C"  Lab Results  Component Value Date   HGB 11.8 (L) 12/25/2018     ASSESSMENT AND PLAN Ms. Bitner  is a 77 y.o. with:  1. Recurrent UTI   2. Pre-op testing   3. Lesion of urinary bladder    - proceed with scheduled cystoscopy and bladder biopsy - reviewed need for urology referral if abnormal pathology.   Loleta Chance, MD

## 2023-10-25 ENCOUNTER — Telehealth: Payer: Self-pay | Admitting: Obstetrics

## 2023-10-25 DIAGNOSIS — R197 Diarrhea, unspecified: Secondary | ICD-10-CM | POA: Diagnosis not present

## 2023-10-25 DIAGNOSIS — R152 Fecal urgency: Secondary | ICD-10-CM | POA: Diagnosis not present

## 2023-10-25 LAB — SURGICAL PATHOLOGY

## 2023-10-25 NOTE — Telephone Encounter (Signed)
Called patient regarding urology follow-up, recommended to call Dr. Logan Bores for follow-up of bladder mass to expedite care since she is an established patient.  Provided phone number for office and advised pt to callback with appt time once she has scheduled her appointment today to avoid delay in care.  Patient expresses understanding, all questions answered.

## 2023-10-28 ENCOUNTER — Encounter: Payer: Medicare Other | Admitting: Physical Therapy

## 2023-10-28 ENCOUNTER — Encounter (HOSPITAL_BASED_OUTPATIENT_CLINIC_OR_DEPARTMENT_OTHER): Payer: Self-pay | Admitting: Obstetrics

## 2023-10-29 ENCOUNTER — Ambulatory Visit (INDEPENDENT_AMBULATORY_CARE_PROVIDER_SITE_OTHER): Payer: Medicare Other | Admitting: Podiatry

## 2023-10-29 ENCOUNTER — Encounter: Payer: Self-pay | Admitting: Podiatry

## 2023-10-29 DIAGNOSIS — M79675 Pain in left toe(s): Secondary | ICD-10-CM

## 2023-10-29 DIAGNOSIS — E1142 Type 2 diabetes mellitus with diabetic polyneuropathy: Secondary | ICD-10-CM

## 2023-10-29 DIAGNOSIS — M79674 Pain in right toe(s): Secondary | ICD-10-CM | POA: Diagnosis not present

## 2023-10-29 DIAGNOSIS — M2041 Other hammer toe(s) (acquired), right foot: Secondary | ICD-10-CM

## 2023-10-29 DIAGNOSIS — B351 Tinea unguium: Secondary | ICD-10-CM | POA: Diagnosis not present

## 2023-10-29 DIAGNOSIS — M2042 Other hammer toe(s) (acquired), left foot: Secondary | ICD-10-CM

## 2023-10-29 DIAGNOSIS — L84 Corns and callosities: Secondary | ICD-10-CM | POA: Diagnosis not present

## 2023-10-30 NOTE — Progress Notes (Addendum)
  Subjective:  Patient ID: [name] [name], female    DOB: 1946/03/24,  MRN: [id number]  Chief Complaint  Patient presents with   Foot Care    Last A1c: 6.9. No anticoag. Needs nails trimmed. No other concerns.     77 y.o. female presents with the above complaint. History confirmed with patient. Patient presenting with pain related to dystrophic thickened elongated nails. Patient is unable to trim own nails related to nail dystrophy and/or mobility issues. Patient does have a history of T2DM.  Patient does have hammertoe deformities and preulcerative callus associated with the left third toe.  She does have a history of neuropathy with her diabetes.  Patient also has a history of bilateral foot drop and wears AFO device dorsiflexor assist in the bilateral lower extremity.  Patient is interested in diabetic shoes.  She was prescribed for by Dr. Annamary Rummage, states she never received them.  Objective:  Physical Exam: warm, cap refill approximately 3 seconds to the digits nail exam onychomycosis of the toenails, onycholysis, and dystrophic nails DP pulses palpable, PT pulses palpable, and protective sensation absent Left Foot:  Pain with palpation of nails due to elongation and dystrophic growth.  Hammertoe contractures are noted with preulcerative callus present to the left third digit distal tuft Right Foot: Pain with palpation of nails due to elongation and dystrophic growth.  Hammertoe contractures are noted of the lesser digits, mild hyperkeratotic tissue buildup to the third toe associated with the end of the nail plate. Pes planus foot type  Assessment:   1. DM type 2 with diabetic peripheral neuropathy (HCC)   2. Pain due to onychomycosis of toenails of both feet   3. Acquired bilateral hammer toes   4. Pre-ulcerative calluses      Plan:  Patient was evaluated and treated and all questions answered.  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   -Previously diabetic shoes have been ordered by Dr. Annamary Rummage, patient states that she never received the shoes.  Will place new order for the patient to be casted and fitted. -Left third toe hyperkeratotic tissue was sharply debrided using 312 scalpel blade without incident. -Silicone crest pads x 2 were dispensed in the interim.  Discussed home care strategies for the calluses and daily foot checks.   #Onychomycosis with pain  -Nails palliatively debrided as below. -Educated on self-care  Procedure: Nail Debridement Rationale: Pain Type of Debridement: manual, sharp debridement. Instrumentation: Nail nipper, rotary burr. Number of Nails: 10  Return in about 3 months (around 01/27/2024) for Diabetic Foot Care.         Bronwen Betters, DPM [facility] / [facility]

## 2023-11-05 DIAGNOSIS — R3989 Other symptoms and signs involving the genitourinary system: Secondary | ICD-10-CM | POA: Diagnosis not present

## 2023-11-18 DIAGNOSIS — G4733 Obstructive sleep apnea (adult) (pediatric): Secondary | ICD-10-CM | POA: Diagnosis not present

## 2023-11-18 DIAGNOSIS — R5383 Other fatigue: Secondary | ICD-10-CM | POA: Diagnosis not present

## 2023-11-19 DIAGNOSIS — I10 Essential (primary) hypertension: Secondary | ICD-10-CM | POA: Diagnosis not present

## 2023-11-19 DIAGNOSIS — Z79899 Other long term (current) drug therapy: Secondary | ICD-10-CM | POA: Diagnosis not present

## 2023-11-19 DIAGNOSIS — M150 Primary generalized (osteo)arthritis: Secondary | ICD-10-CM | POA: Diagnosis not present

## 2023-11-19 DIAGNOSIS — E1142 Type 2 diabetes mellitus with diabetic polyneuropathy: Secondary | ICD-10-CM | POA: Diagnosis not present

## 2023-11-19 DIAGNOSIS — I482 Chronic atrial fibrillation, unspecified: Secondary | ICD-10-CM | POA: Diagnosis not present

## 2023-11-26 ENCOUNTER — Encounter: Payer: Medicare Other | Admitting: Obstetrics

## 2023-12-08 NOTE — Progress Notes (Deleted)
HPI F never smoker, followed for OSA, chronic bronchitis, allergic rhinitis, complicated by nonalcoholic cirrhosis/Dr. Charm Barges GI,   A. Fib/"Watchman" clot- preventing device placed in her atrial appendage , HBP, MI, chronic diastolic CHF, GERD, DM 2, hypothyroid NPSG 11/05/01-AHI 93/hour, desaturation to 63%, body weight 217 pounds PFT 07/26/09-mild restriction and reduction of diffusion. No obstruction, no response to dilator -----------------------------------------------------------------------------------    09/04/22-  78 year old female never smoker followed for OSA, chronic bronchitis, allergic rhinitis, complicated by nonalcoholic Cirrhosis/Dr. Charm Barges GI,  AFib/"Watchman" clot- preventing device placed in her atrial appendage, HTN, MI, chronic diastolic CHF, GERD, DM 2, hypothyroid, Hx Splenectomy, CPAP auto 5-15/APS/[employer]    Download- compliance 67%, AHI 3.7/ hr Body weight today 194 lbs Covid vax-   4 Moderna                                    Flu vax- We are out of senior strength today Download reviewed.  Mask leaks and less strapped on very tight.  She is interested in mask fitting referral.  Sleep is disturbed by frequent nocturia-interstitial cystitis.  Uses rolling walker and no longer drives herself.  Depends on husband's own health is not good. Chronic bronchitis symptoms have almost completely disappeared.  She no longer uses any inhalers and feels her breathing is comfortable. Being actively managed for high blood pressure.  12/10/23- 78 year old female never smoker followed for OSA, chronic bronchitis, allergic rhinitis, complicated by nonalcoholic Cirrhosis/Dr. Charm Barges GI,  AFib/"Watchman" clot- preventing device placed in her atrial appendage, HTN, MI, chronic diastolic CHF, GERD, DM 2, hypothyroid, Hx Splenectomy, CPAP auto 5-15/APS/[facility]    Download- compliance  Body weight today Seen by [name] Ridges, NP last year, complaining of uncomfortable CPAP. Was to have CPAP  titration study- not done.    ROS-see HPI  + = positive Constitutional:   No-   weight loss, night sweats, fevers, chills, + fatigue, lassitude. HEENT:   No-  headaches, difficulty swallowing, tooth/dental problems, sore throat,       No-  sneezing, itching, ear ache, nasal congestion, post nasal drip,  CV:  No-   chest pain, orthopnea, PND, swelling in lower extremities, anasarca, dizziness, palpitations Resp:+ shortness of breath with exertion or at rest.             productive cough,  + non-productive cough,  No- coughing up of blood.              No-   change in color of mucus.  No- wheezing.   Skin: No-   rash or lesions. GI:  No-   heartburn, indigestion, abdominal pain, nausea, vomiting,  GU:  MS:  + joint pain or swelling. + back pain. Neuro-     nothing unusual Psych:  No- change in mood or affect. No depression or anxiety.  No memory loss.  OBJ- Physical Exam General- Alert, Oriented, Affect-appropriate, Distress- none acute, + rolling walker + obesity Skin-  + bleeding mole R cheek she associates with CPAP headgear. Lymphadenopathy- none Head- atraumatic            Eyes- Gross vision intact, PERRLA, conjunctivae and secretions clear            Ears- Hearing, canals-normal            Nose- Clear, no-Septal dev, mucus, polyps, erosion, perforation             Throat- Mallampati II-III ,  mucosa clear , drainage- none, tonsils- atrophic Neck- flexible , trachea midline, no stridor , thyroid nl, carotid no bruit Chest - symmetrical excursion , unlabored           Heart/CV- IRR/Afib , no murmur , no gallop  , no rub, nl s1 s2                           - JVD- none , edema- none, stasis changes +, varices- none           Lung- +few crackles, wheeze- none, cough- none , dullness-none, rub- none           Chest wall-  Abd-  Br/ Gen/ Rectal- Not done, not indicated Extrem- +braces on ankles Neuro- grossly intact to observation

## 2023-12-10 ENCOUNTER — Ambulatory Visit: Payer: Medicare Other | Admitting: Internal Medicine

## 2023-12-17 ENCOUNTER — Other Ambulatory Visit: Payer: Medicare Other

## 2023-12-20 DIAGNOSIS — E1142 Type 2 diabetes mellitus with diabetic polyneuropathy: Secondary | ICD-10-CM | POA: Diagnosis not present

## 2023-12-20 DIAGNOSIS — M150 Primary generalized (osteo)arthritis: Secondary | ICD-10-CM | POA: Diagnosis not present

## 2023-12-20 DIAGNOSIS — I10 Essential (primary) hypertension: Secondary | ICD-10-CM | POA: Diagnosis not present

## 2023-12-20 DIAGNOSIS — I482 Chronic atrial fibrillation, unspecified: Secondary | ICD-10-CM | POA: Diagnosis not present

## 2023-12-20 DIAGNOSIS — Z79899 Other long term (current) drug therapy: Secondary | ICD-10-CM | POA: Diagnosis not present

## 2024-01-03 ENCOUNTER — Other Ambulatory Visit: Payer: Self-pay | Admitting: Cardiology

## 2024-01-17 DIAGNOSIS — Z79899 Other long term (current) drug therapy: Secondary | ICD-10-CM | POA: Diagnosis not present

## 2024-01-17 DIAGNOSIS — I10 Essential (primary) hypertension: Secondary | ICD-10-CM | POA: Diagnosis not present

## 2024-01-17 DIAGNOSIS — M859 Disorder of bone density and structure, unspecified: Secondary | ICD-10-CM | POA: Diagnosis not present

## 2024-01-17 DIAGNOSIS — I482 Chronic atrial fibrillation, unspecified: Secondary | ICD-10-CM | POA: Diagnosis not present

## 2024-01-17 DIAGNOSIS — E1142 Type 2 diabetes mellitus with diabetic polyneuropathy: Secondary | ICD-10-CM | POA: Diagnosis not present

## 2024-01-17 DIAGNOSIS — M150 Primary generalized (osteo)arthritis: Secondary | ICD-10-CM | POA: Diagnosis not present

## 2024-01-29 ENCOUNTER — Other Ambulatory Visit: Payer: Self-pay | Admitting: Cardiology

## 2024-01-30 ENCOUNTER — Telehealth: Payer: Self-pay | Admitting: Cardiology

## 2024-01-30 NOTE — Telephone Encounter (Signed)
 Pt's medication has already been responded to.

## 2024-01-30 NOTE — Telephone Encounter (Signed)
*  STAT* If patient is at the pharmacy, call can be transferred to refill team.   1. Which medications need to be refilled? (please list name of each medication and dose if known)  furosemide (LASIX) 20 MG tablet  2. Which pharmacy/location (including street and city if local pharmacy) is medication to be sent to? [facility] - [place], [place] - [address]  3. Do they need a 30 day or 90 day supply?   90 day supply  Vernona Rieger with [employer] says they received a notice that Rx can't be e-scribed and must be written. I informed her that patient is overdue for an appointment, but she she says patient has trouble with transportation and would like to know if something call be worked out on [employer]. Please advise.

## 2024-02-04 ENCOUNTER — Ambulatory Visit: Payer: Medicare Other | Admitting: Podiatry

## 2024-02-05 ENCOUNTER — Other Ambulatory Visit: Payer: Self-pay | Admitting: Cardiology

## 2024-02-07 ENCOUNTER — Encounter: Payer: Self-pay | Admitting: *Deleted

## 2024-02-09 NOTE — Progress Notes (Unsigned)
 Cardiology Office Note:  .   Date:  02/10/2024  ID:  [name] [name], DOB [birth year], MRN [id number] PCP: Alinda Deem, MD  [facility] Providers Cardiologist:  Norman Herrlich, MD    History of Present Illness: .   [name] [name] is a 78 y.o. female  with a past medical history of CAD, HFpEF, permanent atrial fibrillation s/p Watchman device, hypertension, OSA, chronic liver disease with idiopathic cirrhosis, GERD, DM2, hypothyroidism, recurrent UTIs, fibromyalgia, dyslipidemia.  05/03/2021 echo EF technically difficult study, normal LV size and systolic function, LA severely dilated, mild MR, mild TR 01/27/2019 coronary CTA calcium score 240, 81st percentile, mild mixed atherosclerotic plaque in the proximal RCA with 25-49% stenosis, minimal mixed plaque in the LM less than 25%, 25 to 49% in the LAD 2017 Watchman device at [facility]  Most recently evaluated by Dr. Dulce Sellar on 12/25/2022, her rate was controlled.  She was doing well from a cardiac perspective, no changes were made to her plan of care and she was advised to follow-up in 6 months.  She presents today for follow-up of her CAD atrial fibrillation and heart failure.  She has been doing stable from a cardiac perspective.  She ambulates with a rollator, is relatively sedentary but she is a primary caregiver from her husband and who lives at home with her and suffers with Alzheimer's disease.  She has been bothered by a bedsore on her buttocks, that is her main complaint. She denies chest pain, palpitations, dyspnea, pnd, orthopnea, n, v, dizziness, syncope, edema, weight gain, or early satiety.   ROS: Review of Systems  Cardiovascular:  Positive for leg swelling.  Gastrointestinal:  Positive for diarrhea.  Musculoskeletal:  Positive for back pain.  Skin:        "Bed sore"  All other systems reviewed and are negative.    Studies Reviewed: .        Cardiac Studies & Procedures    ______________________________________________________________________________________________          CT SCANS  CT CORONARY MORPH W/CTA COR W/SCORE 01/27/2019  Addendum 01/27/2019  5:17 PM ADDENDUM REPORT: 01/27/2019 17:15  HISTORY: angina  EXAM: Cardiac/Coronary  CT  TECHNIQUE: The patient was scanned on a [employer].  PROTOCOL: A 120 kV prospective scan was triggered in the descending thoracic aorta at 111 HU's. Axial non-contrast 3 mm slices were carried out through the heart. The data set was analyzed on a dedicated work station and scored using the Agatson method. Gantry rotation speed was 250 msecs and collimation was .6 mm. Beta blockade and 0.8 mg of sl NTG was given. The 3D data set was reconstructed in 5% intervals of the entire cardiac cycle due to atrial fibrillation. Diastolic phases were analyzed on a dedicated work station using MPR, MIP and VRT modes. The patient received 80mL OMNIPAQUE IOHEXOL 350 MG/ML SOLN of contrast.  FINDINGS: Coronary calcium score: The patient's coronary artery calcium score is 240, which places the patient in the 81 percentile.  Coronary arteries: Normal coronary origins.  Right dominance.  Right Coronary Artery: Mild mixed atherosclerotic plaque in the proximal RCA, 25-49% stenosis.  Left Main Coronary Artery: Minimal mixed atherosclerotic plaque in the LM, <25% stenosis.  Left Anterior Descending Coronary Artery: Mild scattered mixed atherosclerotic plaque in the LAD, 25-49% stenosis.  Ramus intermedius: Small caliber ramus intermedius branch is patent.  Left Circumflex Artery: Possible mild atherosclerotic plaque the trifurcation of the first and second OM and the distal circumflex artery, 25-49% stenosis.  Aorta:  Normal size.  No calcifications.  No dissection.  Aortic Valve:  Trileaflet.  Mild annular calcifications.  Other findings:  Severe left atrial enlargement.  Normal pulmonary vein  drainage into the left atrium.  Left atrial appendage occluder device in place. Contrast enters the LA appendage, suggesting incomplete occlusion of the LA appendage.  Normal size of the pulmonary artery.  Severe mitral annular calcification.  Patent foramen ovale vs. iatrogenic atrial septal left to right shunt s/p left atrial trans-septal puncture for placement of LA appendage occluder.  IMPRESSION: 1. The patient's coronary artery calcium score is 240, which places the patient in the 81 percentile.  2. Normal coronary origin with right dominance.  3. Mild CAD, CADRADS = 2. Mild disease in LAD and Left circumflex artery.  4. Left atrial appendage occluder device in place. Contrast enters the LA appendage, suggesting incomplete occlusion of the LA appendage. Cannot exclude thrombus in the LA appendage due to possible incomplete contrast opacification.   Electronically Signed By: Weston Brass On: 01/27/2019 17:15  Narrative EXAM: OVER-READ INTERPRETATION  CT CHEST  The following report is an over-read performed by radiologist Dr. Trudie Reed of [facility] on 01/27/2019. This over-read does not include interpretation of cardiac or coronary anatomy or pathology. The coronary calcium score/coronary CTA interpretation by the cardiologist is attached.  COMPARISON:  None.  FINDINGS: Aortic atherosclerosis. Within the visualized portions of the thorax there are no suspicious appearing pulmonary nodules or masses, there is no acute consolidative airspace disease, no pleural effusions, no pneumothorax and no lymphadenopathy. Visualized portions of the upper abdomen demonstrates a shrunken appearance and nodular contour of the liver, indicative of advanced cirrhosis. There are no aggressive appearing lytic or blastic lesions noted in the visualized portions of the skeleton.  IMPRESSION: 1.  Aortic Atherosclerosis (ICD10-I70.0). 2.  Cirrhosis.  Electronically Signed: By: Trudie Reed M.D. On: 01/27/2019 09:34     ______________________________________________________________________________________________      Risk Assessment/Calculations:    CHA2DS2-VASc Score =     This indicates a  % annual risk of stroke. The patient's score is based upon:              Physical Exam:   VS:  BP (!) 100/58   Pulse 63   Ht 5\' 3"  (1.6 m)   Wt 191 lb (86.6 kg)   SpO2 96%   BMI 33.83 kg/m    Wt Readings from Last 3 Encounters:  02/10/24 191 lb (86.6 kg)  10/24/23 184 lb 12.8 oz (83.8 kg)  09/09/23 189 lb 12.8 oz (86.1 kg)    GEN: Well nourished, well developed in no acute distress NECK: No JVD; No carotid bruits CARDIAC: irregularly irregular, no murmurs, rubs, gallops RESPIRATORY:  Clear to auscultation without rales, wheezing or rhonchi  ABDOMEN: Soft, non-tender, non-distended EXTREMITIES:  trace bilateral edema; No deformity  SKIN: Stage II pressure ulcer to left anterior buttock approximately the size of an eraser tip, stage II pressure ulcer to left lateral buttock approximately the size of an eraser tip  ASSESSMENT AND PLAN: .   Permanent atrial fibrillation- s/p Watchman device in 2017 [facility].  Her rate is controlled.  Continue metoprolol 25 mg daily.  CAD-nonobstructive per coronary CTA in 2020, continue Coreg 6.25 mg twice daily, continue Imdur 60 mg daily, continue nitroglycerin as needed, continue Crestor 5 mg daily.  She is limited with her physical activity, she has braces on bilateral lower extremities as well as mostly dependent on a rollator, encouraged  her to be as physically active as possible.  Pressure ulcer-she has to small pressure ulcers no larger than the size of an eraser, causing her considerable pain.  We talked about avoiding moisture, changing positions frequently when sitting, and apply moisture barrier.   Dyslipidemia-most recent LDL was well-controlled at 52, this  appears to be monitored by her PCP, continue Crestor 5 mg daily.  Hypertension-blood pressure is low normal at 100/58, continue Cardura 4 mg daily, continue Avapro 300 mg daily, continue Norvasc 5 mg daily.  She has had other low normal blood pressure readings as well, she is overall asymptomatic but I advised her that her blood pressure stays persistently low and she is also symptomatic to let us know and we can make some adjustments--she has also had times where she has had to go to the emergency department for very elevated blood pressure readings, sounds like this may be labile for her at times.  DOE-likely multifactorial related to deconditioning, obesity, HFpEF, will repeat echocardiogram for any contributory causes.  HFpEF-NYHA class II, euvolemic, continue Coreg 6.25 mg twice daily, continue Lasix 20 mg Monday Wednesday Friday, continue Avapro 300 mg daily.       Dispo: Echocardiogram, follow-up in 6 months.  Signed, Flossie Dibble, NP

## 2024-02-10 ENCOUNTER — Encounter: Payer: Self-pay | Admitting: Cardiology

## 2024-02-10 ENCOUNTER — Ambulatory Visit: Attending: Cardiology | Admitting: Cardiology

## 2024-02-10 VITALS — BP 100/58 | HR 63 | Ht 63.0 in | Wt 191.0 lb

## 2024-02-10 DIAGNOSIS — E782 Mixed hyperlipidemia: Secondary | ICD-10-CM

## 2024-02-10 DIAGNOSIS — I5032 Chronic diastolic (congestive) heart failure: Secondary | ICD-10-CM

## 2024-02-10 DIAGNOSIS — I2510 Atherosclerotic heart disease of native coronary artery without angina pectoris: Secondary | ICD-10-CM

## 2024-02-10 DIAGNOSIS — R0609 Other forms of dyspnea: Secondary | ICD-10-CM | POA: Diagnosis not present

## 2024-02-10 DIAGNOSIS — Z95818 Presence of other cardiac implants and grafts: Secondary | ICD-10-CM | POA: Diagnosis not present

## 2024-02-10 DIAGNOSIS — L89322 Pressure ulcer of left buttock, stage 2: Secondary | ICD-10-CM

## 2024-02-10 DIAGNOSIS — I482 Chronic atrial fibrillation, unspecified: Secondary | ICD-10-CM | POA: Diagnosis not present

## 2024-02-10 NOTE — Patient Instructions (Addendum)
 Medication Instructions:  Your physician recommends that you continue on your current medications as directed. Please refer to the Current Medication list given to you today.  *If you need a refill on your cardiac medications before your next appointment, please call your pharmacy*   Lab Work: None Ordered If you have labs (blood work) drawn today and your tests are completely normal, you will receive your results only by: MyChart Message (if you have MyChart) OR A paper copy in the mail If you have any lab test that is abnormal or we need to change your treatment, we will call you to review the results.   Testing/Procedures: Echocardiogram An echocardiogram is a test that uses sound waves (ultrasound) to produce images of the heart. Images from an echocardiogram can provide important information about: Heart size and shape. The size and thickness and movement of your heart's walls. Heart muscle function and strength. Heart valve function or if you have stenosis. Stenosis is when the heart valves are too narrow. If blood is flowing backward through the heart valves (regurgitation). A tumor or infectious growth around the heart valves. Areas of heart muscle that are not working well because of poor blood flow or injury from a heart attack. Aneurysm detection. An aneurysm is a weak or damaged part of an artery wall. The wall bulges out from the normal force of blood pumping through the body. Tell a health care provider about: Any allergies you have. All medicines you are taking, including vitamins, herbs, eye drops, creams, and over-the-counter medicines. Any blood disorders you have. Any surgeries you have had. Any medical conditions you have. Whether you are pregnant or may be pregnant. What are the risks? Generally, this is a safe test. However, problems may occur, including an allergic reaction to dye (contrast) that may be used during the test. What happens before the test? No  specific preparation is needed. You may eat and drink normally. What happens during the test?  You will take off your clothes from the waist up and put on a hospital gown. Electrodes or electrocardiogram (ECG)patches may be placed on your chest. The electrodes or patches are then connected to a device that monitors your heart rate and rhythm. You will lie down on a table for an ultrasound exam. A gel will be applied to your chest to help sound waves pass through your skin. A handheld device, called a transducer, will be pressed against your chest and moved over your heart. The transducer produces sound waves that travel to your heart and bounce back (or "echo" back) to the transducer. These sound waves will be captured in real-time and changed into images of your heart that can be viewed on a video monitor. The images will be recorded on a computer and reviewed by your health care provider. You may be asked to change positions or hold your breath for a short time. This makes it easier to get different views or better views of your heart. In some cases, you may receive contrast through an IV in one of your veins. This can improve the quality of the pictures from your heart. The procedure may vary among health care providers and hospitals. What can I expect after the test? You may return to your normal, everyday life, including diet, activities, and medicines, unless your health care provider tells you not to do that. Follow these instructions at home: It is up to you to get the results of your test. Ask your health care provider,  or the department that is doing the test, when your results will be ready. Keep all follow-up visits. This is important. Summary An echocardiogram is a test that uses sound waves (ultrasound) to produce images of the heart. Images from an echocardiogram can provide important information about the size and shape of your heart, heart muscle function, heart valve function, and  other possible heart problems. You do not need to do anything to prepare before this test. You may eat and drink normally. After the echocardiogram is completed, you may return to your normal, everyday life, unless your health care provider tells you not to do that. This information is not intended to replace advice given to you by your health care provider. Make sure you discuss any questions you have with your health care provider. Document Revised: 07/19/2021 Document Reviewed: 06/28/2020 Elsevier Patient Education  2023 Elsevier Inc.        Follow-Up: At [facility], you and your health needs are our priority.  As part of our continuing mission to provide you with exceptional heart care, we have created designated Provider Care Teams.  These Care Teams include your primary Cardiologist (physician) and Advanced Practice Providers (APPs -  Physician Assistants and Nurse Practitioners) who all work together to provide you with the care you need, when you need it.  We recommend signing up for the patient portal called "MyChart".  Sign up information is provided on this After Visit Summary.  MyChart is used to connect with patients for Virtual Visits (Telemedicine).  Patients are able to view lab/test results, encounter notes, upcoming appointments, etc.  Non-urgent messages can be sent to your provider as well.   To learn more about what you can do with MyChart, go to [email or web address].    Your next appointment:   6 month follow up    Keep bottom clean and dry

## 2024-02-14 ENCOUNTER — Telehealth: Payer: Self-pay | Admitting: Cardiology

## 2024-02-14 ENCOUNTER — Other Ambulatory Visit: Payer: Self-pay

## 2024-02-14 MED ORDER — ROSUVASTATIN CALCIUM 5 MG PO TABS: 5.0000 mg | ORAL_TABLET | Freq: Every day | ORAL | 3 refills | Status: AC

## 2024-02-14 MED ORDER — DOXAZOSIN MESYLATE 4 MG PO TABS: 4.0000 mg | ORAL_TABLET | Freq: Every day | ORAL | 3 refills | Status: AC

## 2024-02-14 MED ORDER — ROSUVASTATIN CALCIUM 5 MG PO TABS: 5.0000 mg | ORAL_TABLET | Freq: Every day | ORAL | 3 refills | Status: DC

## 2024-02-14 MED ORDER — FUROSEMIDE 20 MG PO TABS: ORAL_TABLET | ORAL | 3 refills | Status: AC

## 2024-02-14 MED ORDER — DOXAZOSIN MESYLATE 4 MG PO TABS: 4.0000 mg | ORAL_TABLET | Freq: Every day | ORAL | 3 refills | Status: DC

## 2024-02-14 MED ORDER — FUROSEMIDE 20 MG PO TABS: ORAL_TABLET | ORAL | 3 refills | Status: DC

## 2024-02-14 NOTE — Telephone Encounter (Signed)
 Margaretha Glassing, the patient's caregiver informed that the patient's medication has been re-filled and sent to the patient's pharmacy. Margaretha Glassing verbalized understanding and had no further questions at this time.

## 2024-02-14 NOTE — Telephone Encounter (Signed)
*  STAT* If patient is at the pharmacy, call can be transferred to refill team.   1. Which medications need to be refilled? (please list name of each medication and dose if known)   furosemide (LASIX) 20 MG tablet    doxazosin (CARDURA) 4 MG tablet    rosuvastatin (CRESTOR) 5 MG tablet   2. Which pharmacy/location (including street and city if local pharmacy) is medication to be sent to? [facility] - [place], [place] - [address] Phone: [phone]  Fax: [phone]     3. Do they need a 30 day or 90 day supply? 90

## 2024-02-14 NOTE — Telephone Encounter (Signed)
 Pt's medications were sent to pt's pharmacy as requested. Confirmation received.

## 2024-02-17 DIAGNOSIS — I482 Chronic atrial fibrillation, unspecified: Secondary | ICD-10-CM | POA: Diagnosis not present

## 2024-02-17 DIAGNOSIS — I10 Essential (primary) hypertension: Secondary | ICD-10-CM | POA: Diagnosis not present

## 2024-02-17 DIAGNOSIS — Z79899 Other long term (current) drug therapy: Secondary | ICD-10-CM | POA: Diagnosis not present

## 2024-02-17 DIAGNOSIS — I5022 Chronic systolic (congestive) heart failure: Secondary | ICD-10-CM | POA: Diagnosis not present

## 2024-02-17 DIAGNOSIS — M150 Primary generalized (osteo)arthritis: Secondary | ICD-10-CM | POA: Diagnosis not present

## 2024-02-17 DIAGNOSIS — E1142 Type 2 diabetes mellitus with diabetic polyneuropathy: Secondary | ICD-10-CM | POA: Diagnosis not present

## 2024-02-21 DIAGNOSIS — R400 Somnolence: Secondary | ICD-10-CM | POA: Diagnosis not present

## 2024-02-21 DIAGNOSIS — J4520 Mild intermittent asthma, uncomplicated: Secondary | ICD-10-CM | POA: Diagnosis not present

## 2024-02-21 DIAGNOSIS — R5383 Other fatigue: Secondary | ICD-10-CM | POA: Diagnosis not present

## 2024-02-21 DIAGNOSIS — G4733 Obstructive sleep apnea (adult) (pediatric): Secondary | ICD-10-CM | POA: Diagnosis not present

## 2024-02-26 ENCOUNTER — Ambulatory Visit: Attending: Cardiology

## 2024-02-26 DIAGNOSIS — R0609 Other forms of dyspnea: Secondary | ICD-10-CM | POA: Diagnosis not present

## 2024-02-26 LAB — ECHOCARDIOGRAM COMPLETE: S' Lateral: 3 cm

## 2024-03-02 ENCOUNTER — Telehealth: Payer: Self-pay | Admitting: Emergency Medicine

## 2024-03-02 DIAGNOSIS — Z6834 Body mass index (BMI) 34.0-34.9, adult: Secondary | ICD-10-CM | POA: Diagnosis not present

## 2024-03-02 DIAGNOSIS — I10 Essential (primary) hypertension: Secondary | ICD-10-CM | POA: Diagnosis not present

## 2024-03-02 DIAGNOSIS — E1122 Type 2 diabetes mellitus with diabetic chronic kidney disease: Secondary | ICD-10-CM | POA: Diagnosis not present

## 2024-03-02 DIAGNOSIS — I5032 Chronic diastolic (congestive) heart failure: Secondary | ICD-10-CM | POA: Diagnosis not present

## 2024-03-02 DIAGNOSIS — E113219 Type 2 diabetes mellitus with mild nonproliferative diabetic retinopathy with macular edema, unspecified eye: Secondary | ICD-10-CM | POA: Diagnosis not present

## 2024-03-02 DIAGNOSIS — I482 Chronic atrial fibrillation, unspecified: Secondary | ICD-10-CM | POA: Diagnosis not present

## 2024-03-02 DIAGNOSIS — N1832 Chronic kidney disease, stage 3b: Secondary | ICD-10-CM | POA: Diagnosis not present

## 2024-03-02 DIAGNOSIS — E1142 Type 2 diabetes mellitus with diabetic polyneuropathy: Secondary | ICD-10-CM | POA: Diagnosis not present

## 2024-03-02 DIAGNOSIS — E7800 Pure hypercholesterolemia, unspecified: Secondary | ICD-10-CM | POA: Diagnosis not present

## 2024-03-02 DIAGNOSIS — E669 Obesity, unspecified: Secondary | ICD-10-CM | POA: Diagnosis not present

## 2024-03-02 DIAGNOSIS — I25119 Atherosclerotic heart disease of native coronary artery with unspecified angina pectoris: Secondary | ICD-10-CM | POA: Diagnosis not present

## 2024-03-02 NOTE — Telephone Encounter (Signed)
-----   Message from Terrance Ferretti sent at 02/27/2024  5:14 PM EDT ----- Your echocardiogram shows that your heart is squeezing normally.  There is some thickening of the heart muscle, this is typically attributed to people that have high blood pressure.  You have mild to moderate leaking around your mitral valve--this likely would not cause any symptoms but could cause you to feel short of breath at times.   Overall, stable echo. Likely repeat in 1 year.

## 2024-03-02 NOTE — Telephone Encounter (Signed)
 Attempted to call patient, however patient is busy. Will call again later.

## 2024-03-03 DIAGNOSIS — I25119 Atherosclerotic heart disease of native coronary artery with unspecified angina pectoris: Secondary | ICD-10-CM | POA: Diagnosis not present

## 2024-03-03 DIAGNOSIS — E1142 Type 2 diabetes mellitus with diabetic polyneuropathy: Secondary | ICD-10-CM | POA: Diagnosis not present

## 2024-03-03 DIAGNOSIS — I10 Essential (primary) hypertension: Secondary | ICD-10-CM | POA: Diagnosis not present

## 2024-03-03 DIAGNOSIS — R300 Dysuria: Secondary | ICD-10-CM | POA: Diagnosis not present

## 2024-03-03 DIAGNOSIS — E7800 Pure hypercholesterolemia, unspecified: Secondary | ICD-10-CM | POA: Diagnosis not present

## 2024-03-03 DIAGNOSIS — E039 Hypothyroidism, unspecified: Secondary | ICD-10-CM | POA: Diagnosis not present

## 2024-03-03 DIAGNOSIS — Z87440 Personal history of urinary (tract) infections: Secondary | ICD-10-CM | POA: Diagnosis not present

## 2024-03-04 LAB — LAB REPORT - SCANNED
Albumin, Urine POC: 7.8
Creatinine, POC: 118.2 mg/dL
EGFR: 55
Free T4: 1.44 ng/dL
Microalb Creat Ratio: 66
TSH: 2.55 (ref 0.41–5.90)

## 2024-03-09 NOTE — Telephone Encounter (Signed)
 Called and spoke to patient.  Reviewed echo results with patient as per [facility] result note. She verbalized understanding and had no further questions.

## 2024-03-09 NOTE — Telephone Encounter (Signed)
 Attempted to call patient, no answer

## 2024-03-18 DIAGNOSIS — N1831 Chronic kidney disease, stage 3a: Secondary | ICD-10-CM | POA: Diagnosis not present

## 2024-03-18 DIAGNOSIS — Z79899 Other long term (current) drug therapy: Secondary | ICD-10-CM | POA: Diagnosis not present

## 2024-03-18 DIAGNOSIS — E7800 Pure hypercholesterolemia, unspecified: Secondary | ICD-10-CM | POA: Diagnosis not present

## 2024-03-18 DIAGNOSIS — E1140 Type 2 diabetes mellitus with diabetic neuropathy, unspecified: Secondary | ICD-10-CM | POA: Diagnosis not present

## 2024-03-18 DIAGNOSIS — I10 Essential (primary) hypertension: Secondary | ICD-10-CM | POA: Diagnosis not present

## 2024-03-18 DIAGNOSIS — E039 Hypothyroidism, unspecified: Secondary | ICD-10-CM | POA: Diagnosis not present

## 2024-03-18 DIAGNOSIS — I482 Chronic atrial fibrillation, unspecified: Secondary | ICD-10-CM | POA: Diagnosis not present

## 2024-03-30 DIAGNOSIS — G629 Polyneuropathy, unspecified: Secondary | ICD-10-CM | POA: Diagnosis not present

## 2024-03-30 DIAGNOSIS — N189 Chronic kidney disease, unspecified: Secondary | ICD-10-CM | POA: Diagnosis not present

## 2024-03-30 DIAGNOSIS — R54 Age-related physical debility: Secondary | ICD-10-CM | POA: Diagnosis not present

## 2024-03-30 DIAGNOSIS — E119 Type 2 diabetes mellitus without complications: Secondary | ICD-10-CM | POA: Diagnosis not present

## 2024-03-31 DIAGNOSIS — Z741 Need for assistance with personal care: Secondary | ICD-10-CM | POA: Diagnosis not present

## 2024-03-31 DIAGNOSIS — E785 Hyperlipidemia, unspecified: Secondary | ICD-10-CM | POA: Diagnosis not present

## 2024-03-31 DIAGNOSIS — M6281 Muscle weakness (generalized): Secondary | ICD-10-CM | POA: Diagnosis not present

## 2024-03-31 DIAGNOSIS — E039 Hypothyroidism, unspecified: Secondary | ICD-10-CM | POA: Diagnosis not present

## 2024-03-31 DIAGNOSIS — R2681 Unsteadiness on feet: Secondary | ICD-10-CM | POA: Diagnosis not present

## 2024-03-31 DIAGNOSIS — E119 Type 2 diabetes mellitus without complications: Secondary | ICD-10-CM | POA: Diagnosis not present

## 2024-03-31 DIAGNOSIS — Z7409 Other reduced mobility: Secondary | ICD-10-CM | POA: Diagnosis not present

## 2024-03-31 DIAGNOSIS — I2510 Atherosclerotic heart disease of native coronary artery without angina pectoris: Secondary | ICD-10-CM | POA: Diagnosis not present

## 2024-04-01 DIAGNOSIS — K219 Gastro-esophageal reflux disease without esophagitis: Secondary | ICD-10-CM | POA: Diagnosis not present

## 2024-04-01 DIAGNOSIS — I10 Essential (primary) hypertension: Secondary | ICD-10-CM | POA: Diagnosis not present

## 2024-04-01 DIAGNOSIS — R2681 Unsteadiness on feet: Secondary | ICD-10-CM | POA: Diagnosis not present

## 2024-04-01 DIAGNOSIS — Z7409 Other reduced mobility: Secondary | ICD-10-CM | POA: Diagnosis not present

## 2024-04-01 DIAGNOSIS — Z741 Need for assistance with personal care: Secondary | ICD-10-CM | POA: Diagnosis not present

## 2024-04-01 DIAGNOSIS — E119 Type 2 diabetes mellitus without complications: Secondary | ICD-10-CM | POA: Diagnosis not present

## 2024-04-01 DIAGNOSIS — M6281 Muscle weakness (generalized): Secondary | ICD-10-CM | POA: Diagnosis not present

## 2024-04-02 DIAGNOSIS — M6281 Muscle weakness (generalized): Secondary | ICD-10-CM | POA: Diagnosis not present

## 2024-04-02 DIAGNOSIS — R2681 Unsteadiness on feet: Secondary | ICD-10-CM | POA: Diagnosis not present

## 2024-04-02 DIAGNOSIS — Z7409 Other reduced mobility: Secondary | ICD-10-CM | POA: Diagnosis not present

## 2024-04-02 DIAGNOSIS — Z741 Need for assistance with personal care: Secondary | ICD-10-CM | POA: Diagnosis not present

## 2024-04-03 DIAGNOSIS — Z7409 Other reduced mobility: Secondary | ICD-10-CM | POA: Diagnosis not present

## 2024-04-03 DIAGNOSIS — M6281 Muscle weakness (generalized): Secondary | ICD-10-CM | POA: Diagnosis not present

## 2024-04-03 DIAGNOSIS — R799 Abnormal finding of blood chemistry, unspecified: Secondary | ICD-10-CM | POA: Diagnosis not present

## 2024-04-03 DIAGNOSIS — R2681 Unsteadiness on feet: Secondary | ICD-10-CM | POA: Diagnosis not present

## 2024-04-03 DIAGNOSIS — Z741 Need for assistance with personal care: Secondary | ICD-10-CM | POA: Diagnosis not present

## 2024-04-06 DIAGNOSIS — M6281 Muscle weakness (generalized): Secondary | ICD-10-CM | POA: Diagnosis not present

## 2024-04-06 DIAGNOSIS — I10 Essential (primary) hypertension: Secondary | ICD-10-CM | POA: Diagnosis not present

## 2024-04-06 DIAGNOSIS — Z741 Need for assistance with personal care: Secondary | ICD-10-CM | POA: Diagnosis not present

## 2024-04-06 DIAGNOSIS — R2681 Unsteadiness on feet: Secondary | ICD-10-CM | POA: Diagnosis not present

## 2024-04-06 DIAGNOSIS — Z7409 Other reduced mobility: Secondary | ICD-10-CM | POA: Diagnosis not present

## 2024-04-07 DIAGNOSIS — R2681 Unsteadiness on feet: Secondary | ICD-10-CM | POA: Diagnosis not present

## 2024-04-07 DIAGNOSIS — Z7409 Other reduced mobility: Secondary | ICD-10-CM | POA: Diagnosis not present

## 2024-04-07 DIAGNOSIS — M6281 Muscle weakness (generalized): Secondary | ICD-10-CM | POA: Diagnosis not present

## 2024-04-07 DIAGNOSIS — Z741 Need for assistance with personal care: Secondary | ICD-10-CM | POA: Diagnosis not present

## 2024-04-08 DIAGNOSIS — Z741 Need for assistance with personal care: Secondary | ICD-10-CM | POA: Diagnosis not present

## 2024-04-08 DIAGNOSIS — Z7409 Other reduced mobility: Secondary | ICD-10-CM | POA: Diagnosis not present

## 2024-04-08 DIAGNOSIS — R2681 Unsteadiness on feet: Secondary | ICD-10-CM | POA: Diagnosis not present

## 2024-04-08 DIAGNOSIS — M6281 Muscle weakness (generalized): Secondary | ICD-10-CM | POA: Diagnosis not present

## 2024-04-09 DIAGNOSIS — M6281 Muscle weakness (generalized): Secondary | ICD-10-CM | POA: Diagnosis not present

## 2024-04-09 DIAGNOSIS — R2681 Unsteadiness on feet: Secondary | ICD-10-CM | POA: Diagnosis not present

## 2024-04-09 DIAGNOSIS — Z741 Need for assistance with personal care: Secondary | ICD-10-CM | POA: Diagnosis not present

## 2024-04-09 DIAGNOSIS — Z7409 Other reduced mobility: Secondary | ICD-10-CM | POA: Diagnosis not present

## 2024-04-10 DIAGNOSIS — R2681 Unsteadiness on feet: Secondary | ICD-10-CM | POA: Diagnosis not present

## 2024-04-10 DIAGNOSIS — M6281 Muscle weakness (generalized): Secondary | ICD-10-CM | POA: Diagnosis not present

## 2024-04-10 DIAGNOSIS — Z741 Need for assistance with personal care: Secondary | ICD-10-CM | POA: Diagnosis not present

## 2024-04-10 DIAGNOSIS — Z7409 Other reduced mobility: Secondary | ICD-10-CM | POA: Diagnosis not present

## 2024-04-13 DIAGNOSIS — Z741 Need for assistance with personal care: Secondary | ICD-10-CM | POA: Diagnosis not present

## 2024-04-13 DIAGNOSIS — M6281 Muscle weakness (generalized): Secondary | ICD-10-CM | POA: Diagnosis not present

## 2024-04-13 DIAGNOSIS — R2681 Unsteadiness on feet: Secondary | ICD-10-CM | POA: Diagnosis not present

## 2024-04-13 DIAGNOSIS — Z7409 Other reduced mobility: Secondary | ICD-10-CM | POA: Diagnosis not present

## 2024-04-14 ENCOUNTER — Other Ambulatory Visit: Payer: Self-pay | Admitting: Hematology and Oncology

## 2024-04-14 DIAGNOSIS — R718 Other abnormality of red blood cells: Secondary | ICD-10-CM

## 2024-04-14 DIAGNOSIS — F419 Anxiety disorder, unspecified: Secondary | ICD-10-CM | POA: Diagnosis not present

## 2024-04-14 DIAGNOSIS — D61818 Other pancytopenia: Secondary | ICD-10-CM

## 2024-04-15 ENCOUNTER — Inpatient Hospital Stay: Attending: Hematology and Oncology | Admitting: Hematology and Oncology

## 2024-04-15 ENCOUNTER — Other Ambulatory Visit: Payer: Self-pay

## 2024-04-15 ENCOUNTER — Encounter: Payer: Self-pay | Admitting: Hematology and Oncology

## 2024-04-15 ENCOUNTER — Inpatient Hospital Stay

## 2024-04-15 ENCOUNTER — Other Ambulatory Visit: Payer: Self-pay | Admitting: Hematology and Oncology

## 2024-04-15 VITALS — BP 122/65 | HR 84 | Resp 14 | Ht 63.0 in | Wt 195.1 lb

## 2024-04-15 DIAGNOSIS — D61818 Other pancytopenia: Secondary | ICD-10-CM

## 2024-04-15 DIAGNOSIS — D649 Anemia, unspecified: Secondary | ICD-10-CM | POA: Diagnosis not present

## 2024-04-15 DIAGNOSIS — R718 Other abnormality of red blood cells: Secondary | ICD-10-CM

## 2024-04-15 DIAGNOSIS — M6281 Muscle weakness (generalized): Secondary | ICD-10-CM | POA: Diagnosis not present

## 2024-04-15 DIAGNOSIS — R2681 Unsteadiness on feet: Secondary | ICD-10-CM | POA: Diagnosis not present

## 2024-04-15 DIAGNOSIS — Z7409 Other reduced mobility: Secondary | ICD-10-CM | POA: Diagnosis not present

## 2024-04-15 DIAGNOSIS — Z741 Need for assistance with personal care: Secondary | ICD-10-CM | POA: Diagnosis not present

## 2024-04-15 LAB — CBC WITH DIFFERENTIAL (CANCER CENTER ONLY)
Abs Immature Granulocytes: 0 10*3/uL (ref 0.00–0.07)
Basophils Absolute: 0 10*3/uL (ref 0.0–0.1)
Basophils Relative: 0 %
Eosinophils Absolute: 0 10*3/uL (ref 0.0–0.5)
Eosinophils Relative: 1 %
HCT: 31.9 % — ABNORMAL LOW (ref 36.0–46.0)
Hemoglobin: 10.2 g/dL — ABNORMAL LOW (ref 12.0–15.0)
Immature Granulocytes: 0 %
Lymphocytes Relative: 22 %
Lymphs Abs: 0.6 10*3/uL — ABNORMAL LOW (ref 0.7–4.0)
MCH: 28.4 pg (ref 26.0–34.0)
MCHC: 32 g/dL (ref 30.0–36.0)
MCV: 88.9 fL (ref 80.0–100.0)
Monocytes Absolute: 0.2 10*3/uL (ref 0.1–1.0)
Monocytes Relative: 8 %
Neutro Abs: 2.1 10*3/uL (ref 1.7–7.7)
Neutrophils Relative %: 69 %
Platelet Count: 116 10*3/uL — ABNORMAL LOW (ref 150–400)
RBC: 3.59 MIL/uL — ABNORMAL LOW (ref 3.87–5.11)
RDW: 13.9 % (ref 11.5–15.5)
WBC Count: 2.9 10*3/uL — ABNORMAL LOW (ref 4.0–10.5)
nRBC: 0 % (ref 0.0–0.2)

## 2024-04-15 LAB — VITAMIN B12: Vitamin B-12: 247 pg/mL (ref 180–914)

## 2024-04-15 LAB — IRON AND TIBC
Iron: 43 ug/dL (ref 28–170)
Saturation Ratios: 15 % (ref 10.4–31.8)
TIBC: 295 ug/dL (ref 250–450)
UIBC: 252 ug/dL

## 2024-04-15 LAB — FERRITIN: Ferritin: 48 ng/mL (ref 11–307)

## 2024-04-15 LAB — CMP (CANCER CENTER ONLY)
ALT: 15 U/L (ref 0–44)
AST: 23 U/L (ref 15–41)
Albumin: 3.7 g/dL (ref 3.5–5.0)
Alkaline Phosphatase: 100 U/L (ref 38–126)
Anion gap: 13 (ref 5–15)
BUN: 15 mg/dL (ref 8–23)
CO2: 24 mmol/L (ref 22–32)
Calcium: 9.7 mg/dL (ref 8.9–10.3)
Chloride: 100 mmol/L (ref 98–111)
Creatinine: 0.94 mg/dL (ref 0.44–1.00)
GFR, Estimated: >60 mL/min (ref >60)
Glucose, Bld: 224 mg/dL — ABNORMAL HIGH (ref 70–99)
Potassium: 3.9 mmol/L (ref 3.5–5.1)
Sodium: 137 mmol/L (ref 135–145)
Total Bilirubin: 0.4 mg/dL (ref 0.0–1.2)
Total Protein: 6.8 g/dL (ref 6.5–8.1)

## 2024-04-15 LAB — FOLATE: Folate: 26.7 ng/mL (ref >5.9)

## 2024-04-15 LAB — LACTATE DEHYDROGENASE: LDH: 137 U/L (ref 98–192)

## 2024-04-16 ENCOUNTER — Telehealth: Payer: Self-pay | Admitting: Hematology and Oncology

## 2024-04-16 DIAGNOSIS — R2681 Unsteadiness on feet: Secondary | ICD-10-CM | POA: Diagnosis not present

## 2024-04-16 DIAGNOSIS — M6281 Muscle weakness (generalized): Secondary | ICD-10-CM | POA: Diagnosis not present

## 2024-04-16 DIAGNOSIS — Z741 Need for assistance with personal care: Secondary | ICD-10-CM | POA: Diagnosis not present

## 2024-04-16 DIAGNOSIS — Z7409 Other reduced mobility: Secondary | ICD-10-CM | POA: Diagnosis not present

## 2024-04-16 LAB — BETA 2 MICROGLOBULIN, SERUM: Beta-2 Microglobulin: 3.3 mg/L — ABNORMAL HIGH (ref 0.6–2.4)

## 2024-04-16 LAB — IGG, IGA, IGM
IgA: 487 mg/dL — ABNORMAL HIGH (ref 64–422)
IgG (Immunoglobin G), Serum: 1331 mg/dL (ref 586–1602)
IgM (Immunoglobulin M), Srm: 146 mg/dL (ref 26–217)

## 2024-04-16 LAB — KAPPA/LAMBDA LIGHT CHAINS
Kappa free light chain: 54.9 mg/L — ABNORMAL HIGH (ref 3.3–19.4)
Kappa, lambda light chain ratio: 1.38 (ref 0.26–1.65)
Lambda free light chains: 39.9 mg/L — ABNORMAL HIGH (ref 5.7–26.3)

## 2024-04-16 NOTE — Telephone Encounter (Signed)
 04/16/24 Spoke with Carolyn([facility])and confirmed next appts.

## 2024-04-17 DIAGNOSIS — M6281 Muscle weakness (generalized): Secondary | ICD-10-CM | POA: Diagnosis not present

## 2024-04-17 DIAGNOSIS — R2681 Unsteadiness on feet: Secondary | ICD-10-CM | POA: Diagnosis not present

## 2024-04-17 DIAGNOSIS — Z7409 Other reduced mobility: Secondary | ICD-10-CM | POA: Diagnosis not present

## 2024-04-17 DIAGNOSIS — Z741 Need for assistance with personal care: Secondary | ICD-10-CM | POA: Diagnosis not present

## 2024-04-17 LAB — MULTIPLE MYELOMA PANEL, SERUM
Albumin SerPl Elph-Mcnc: 3.1 g/dL (ref 2.9–4.4)
Albumin/Glob SerPl: 1 (ref 0.7–1.7)
Alpha 1: 0.2 g/dL (ref 0.0–0.4)
Alpha2 Glob SerPl Elph-Mcnc: 0.6 g/dL (ref 0.4–1.0)
B-Globulin SerPl Elph-Mcnc: 1 g/dL (ref 0.7–1.3)
Gamma Glob SerPl Elph-Mcnc: 1.3 g/dL (ref 0.4–1.8)
Globulin, Total: 3.2 g/dL (ref 2.2–3.9)
IgA: 483 mg/dL — ABNORMAL HIGH (ref 64–422)
IgG (Immunoglobin G), Serum: 1353 mg/dL (ref 586–1602)
IgM (Immunoglobulin M), Srm: 137 mg/dL (ref 26–217)
Total Protein ELP: 6.3 g/dL (ref 6.0–8.5)

## 2024-04-17 LAB — SURGICAL PATHOLOGY

## 2024-04-18 DIAGNOSIS — M859 Disorder of bone density and structure, unspecified: Secondary | ICD-10-CM | POA: Diagnosis not present

## 2024-04-18 DIAGNOSIS — I10 Essential (primary) hypertension: Secondary | ICD-10-CM | POA: Diagnosis not present

## 2024-04-18 DIAGNOSIS — I482 Chronic atrial fibrillation, unspecified: Secondary | ICD-10-CM | POA: Diagnosis not present

## 2024-04-18 DIAGNOSIS — N1831 Chronic kidney disease, stage 3a: Secondary | ICD-10-CM | POA: Diagnosis not present

## 2024-04-18 DIAGNOSIS — E1140 Type 2 diabetes mellitus with diabetic neuropathy, unspecified: Secondary | ICD-10-CM | POA: Diagnosis not present

## 2024-04-18 DIAGNOSIS — Z79899 Other long term (current) drug therapy: Secondary | ICD-10-CM | POA: Diagnosis not present

## 2024-04-20 DIAGNOSIS — Z741 Need for assistance with personal care: Secondary | ICD-10-CM | POA: Diagnosis not present

## 2024-04-20 DIAGNOSIS — R2681 Unsteadiness on feet: Secondary | ICD-10-CM | POA: Diagnosis not present

## 2024-04-20 DIAGNOSIS — M6281 Muscle weakness (generalized): Secondary | ICD-10-CM | POA: Diagnosis not present

## 2024-04-20 DIAGNOSIS — Z7409 Other reduced mobility: Secondary | ICD-10-CM | POA: Diagnosis not present

## 2024-04-20 LAB — FLOW CYTOMETRY

## 2024-04-20 LAB — BCR-ABL1 FISH
Cells Analyzed: 200
Cells Counted: 200

## 2024-04-21 DIAGNOSIS — Z741 Need for assistance with personal care: Secondary | ICD-10-CM | POA: Diagnosis not present

## 2024-04-21 DIAGNOSIS — R2681 Unsteadiness on feet: Secondary | ICD-10-CM | POA: Diagnosis not present

## 2024-04-21 DIAGNOSIS — Z7409 Other reduced mobility: Secondary | ICD-10-CM | POA: Diagnosis not present

## 2024-04-21 DIAGNOSIS — M6281 Muscle weakness (generalized): Secondary | ICD-10-CM | POA: Diagnosis not present

## 2024-04-21 LAB — JAK2 V617F RFX CALR/MPL/E12-15

## 2024-04-21 LAB — CALR +MPL + E12-E15  (REFLEX)

## 2024-04-22 DIAGNOSIS — M6281 Muscle weakness (generalized): Secondary | ICD-10-CM | POA: Diagnosis not present

## 2024-04-22 DIAGNOSIS — Z741 Need for assistance with personal care: Secondary | ICD-10-CM | POA: Diagnosis not present

## 2024-04-22 DIAGNOSIS — Z7409 Other reduced mobility: Secondary | ICD-10-CM | POA: Diagnosis not present

## 2024-04-22 DIAGNOSIS — R2681 Unsteadiness on feet: Secondary | ICD-10-CM | POA: Diagnosis not present

## 2024-04-23 DIAGNOSIS — M6281 Muscle weakness (generalized): Secondary | ICD-10-CM | POA: Diagnosis not present

## 2024-04-23 DIAGNOSIS — Z741 Need for assistance with personal care: Secondary | ICD-10-CM | POA: Diagnosis not present

## 2024-04-23 DIAGNOSIS — R2681 Unsteadiness on feet: Secondary | ICD-10-CM | POA: Diagnosis not present

## 2024-04-23 DIAGNOSIS — Z7409 Other reduced mobility: Secondary | ICD-10-CM | POA: Diagnosis not present

## 2024-04-24 DIAGNOSIS — M6281 Muscle weakness (generalized): Secondary | ICD-10-CM | POA: Diagnosis not present

## 2024-04-24 DIAGNOSIS — Z741 Need for assistance with personal care: Secondary | ICD-10-CM | POA: Diagnosis not present

## 2024-04-24 DIAGNOSIS — R2681 Unsteadiness on feet: Secondary | ICD-10-CM | POA: Diagnosis not present

## 2024-04-24 DIAGNOSIS — Z7409 Other reduced mobility: Secondary | ICD-10-CM | POA: Diagnosis not present

## 2024-04-26 DIAGNOSIS — Z741 Need for assistance with personal care: Secondary | ICD-10-CM | POA: Diagnosis not present

## 2024-04-26 DIAGNOSIS — Z7409 Other reduced mobility: Secondary | ICD-10-CM | POA: Diagnosis not present

## 2024-04-26 DIAGNOSIS — M6281 Muscle weakness (generalized): Secondary | ICD-10-CM | POA: Diagnosis not present

## 2024-04-26 DIAGNOSIS — R2681 Unsteadiness on feet: Secondary | ICD-10-CM | POA: Diagnosis not present

## 2024-04-27 ENCOUNTER — Other Ambulatory Visit: Payer: Self-pay | Admitting: Oncology

## 2024-04-27 DIAGNOSIS — Z741 Need for assistance with personal care: Secondary | ICD-10-CM | POA: Diagnosis not present

## 2024-04-27 DIAGNOSIS — M6281 Muscle weakness (generalized): Secondary | ICD-10-CM | POA: Diagnosis not present

## 2024-04-27 DIAGNOSIS — R2681 Unsteadiness on feet: Secondary | ICD-10-CM | POA: Diagnosis not present

## 2024-04-27 DIAGNOSIS — Z7409 Other reduced mobility: Secondary | ICD-10-CM | POA: Diagnosis not present

## 2024-04-27 DIAGNOSIS — D61818 Other pancytopenia: Secondary | ICD-10-CM

## 2024-04-27 NOTE — Progress Notes (Unsigned)
 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  She comes in today to go over all over her recent labs to determine the etiology behind this.     Of note, she has had low counts dating back to 2013. She did have CT scans in June 2022 which showed evidence of cirrhosis and secondary splenomegaly.  PHYSICAL EXAM:   There were no vitals taken for this visit. Wt Readings from Last 3 Encounters:  04/15/24 195 lb 1.6 oz (88.5 kg)  02/10/24 191 lb (86.6 kg)  10/24/23 184 lb 12.8 oz (83.8 kg)   There is no height or weight on file to calculate BMI. Performance status (ECOG): {CHL ONC [id number] Physical Exam  LABS:      Latest Ref Rng & Units 04/15/2024    3:28 PM 10/24/2023   12:24 PM 12/25/2018   11:18 AM  CBC  WBC 4.0 - 10.5 K/uL 2.9   5.2   Hemoglobin 12.0 - 15.0 g/dL 91.4  78.2  95.6   Hematocrit 36.0 - 46.0 % 31.9  37.0  38.3   Platelets 150 - 400 K/uL 116   98       Latest Ref Rng & Units 04/15/2024    3:28 PM 10/24/2023   12:24 PM 01/20/2019   10:15 AM  CMP  Glucose 70 - 99 mg/dL 213  086  578   BUN 8 - 23 mg/dL 15  10  15    Creatinine 0.44 - 1.00 mg/dL 4.69  6.29  5.28   Sodium 135 - 145 mmol/L 137  138  133   Potassium 3.5 - 5.1 mmol/L 3.9  3.8  4.1   Chloride 98 - 111 mmol/L 100  101  96   CO2 22 - 32 mmol/L 24   20   Calcium  8.9 - 10.3 mg/dL 9.7   9.8   Total Protein 6.5 - 8.1 g/dL 6.8     Total Bilirubin 0.0 - 1.2 mg/dL 0.4     Alkaline Phos 38 - 126 U/L 100     AST 15 - 41 U/L 23     ALT 0 - 44 U/L 15       Latest Reference Range & Units 04/15/24 15:29  Total Protein ELP 6.0 - 8.5 g/dL 6.3 (C)  Albumin  SerPl Elph-Mcnc 2.9 - 4.4 g/dL 3.1 (C)  Albumin /Glob SerPl 0.7 - 1.7  1.0 (C)  Alpha2 Glob SerPl Elph-Mcnc 0.4 - 1.0 g/dL 0.6 (C)  Alpha 1 0.0 - 0.4  g/dL 0.2 (C)  Gamma Glob SerPl Elph-Mcnc 0.4 - 1.8 g/dL 1.3 (C)  M Protein SerPl Elph-Mcnc Not Observed g/dL Not Observed (C)  IFE 1  Comment:Polyclonal increase detected in one or more immunoglobulins.    Globulin, Total 2.2 - 3.9 g/dL 3.2 (C)  B-Globulin SerPl Elph-Mcnc 0.7 - 1.3 g/dL 1.0 (C)  IgG (Immunoglobin G), Serum 586 - 1,602 mg/dL 4,132  IgM (Immunoglobulin M), Srm 26 - 217 mg/dL 440  IgA 64 - 102 mg/dL 725 (H)  !: Data is abnormal (H): Data is abnormally high (C): Corrected  Latest Reference Range & Units 04/15/24 15:28  Iron 28 - 170 ug/dL 43  UIBC ug/dL 366  TIBC 440 - 347 ug/dL 425  Saturation Ratios 10.4 - 31.8 % 15  Ferritin 11 -  307 ng/mL 48  Folate >5.9 ng/mL 26.7  Vitamin B12 180 - 914 pg/mL 247   No results found for: "CEA1", "CEA" / No results found for: "CEA1", "CEA" No results found for: "PSA1" No results found for: "CAN199" No results found for: "CAN125"  Lab Results  Component Value Date   TOTALPROTELP 6.3 04/15/2024   Lab Results  Component Value Date   TIBC 295 04/15/2024   FERRITIN 48 04/15/2024   IRONPCTSAT 15 04/15/2024   Lab Results  Component Value Date   LDH 137 04/15/2024       Component Value Date/Time   TOTALPROTELP 6.3 04/15/2024 1529   LDH 137 04/15/2024 1528   IGGSERUM 1,353 04/15/2024 1529   IGMSERUM 137 04/15/2024 1529    Review Flowsheet       Latest Ref Rng & Units 04/15/2024  Oncology Labs  Ferritin 11 - 307 ng/mL 48   %SAT 10.4 - 31.8 % 15   Total Protein ELP 6.0 - 8.5 g/dL 6.3  C  LDH 98 - 914 U/L 137   IgG (Immunoglobin G), Serum 586 - 1,602 mg/dL 7,829  5,621   IgM, Serum 26 - 217 mg/dL 308  657     Details      C Corrected result   Multiple values from one day are sorted in reverse-chronological order          STUDIES:  No results found.    ASSESSMENT & PLAN:   Assessment/Plan:  A 78 y.o. female with *** .The patient understands all the plans discussed today and is in agreement with them.       Larkin Morelos Felicia Horde, MD

## 2024-04-28 ENCOUNTER — Inpatient Hospital Stay: Attending: Oncology | Admitting: Oncology

## 2024-04-28 ENCOUNTER — Inpatient Hospital Stay

## 2024-04-28 ENCOUNTER — Other Ambulatory Visit: Payer: Self-pay | Admitting: Oncology

## 2024-04-28 VITALS — BP 150/66 | HR 93 | Temp 98.4°F | Resp 16 | Ht 63.0 in | Wt 198.0 lb

## 2024-04-28 DIAGNOSIS — D61818 Other pancytopenia: Secondary | ICD-10-CM

## 2024-04-28 DIAGNOSIS — M6281 Muscle weakness (generalized): Secondary | ICD-10-CM | POA: Diagnosis not present

## 2024-04-28 DIAGNOSIS — K7460 Unspecified cirrhosis of liver: Secondary | ICD-10-CM | POA: Insufficient documentation

## 2024-04-28 DIAGNOSIS — Z79899 Other long term (current) drug therapy: Secondary | ICD-10-CM | POA: Insufficient documentation

## 2024-04-28 DIAGNOSIS — R2681 Unsteadiness on feet: Secondary | ICD-10-CM | POA: Diagnosis not present

## 2024-04-28 DIAGNOSIS — Z741 Need for assistance with personal care: Secondary | ICD-10-CM | POA: Diagnosis not present

## 2024-04-28 DIAGNOSIS — Z7409 Other reduced mobility: Secondary | ICD-10-CM | POA: Diagnosis not present

## 2024-04-28 DIAGNOSIS — D649 Anemia, unspecified: Secondary | ICD-10-CM

## 2024-04-28 LAB — CBC WITH DIFFERENTIAL (CANCER CENTER ONLY)
Abs Immature Granulocytes: 0.02 10*3/uL (ref 0.00–0.07)
Basophils Absolute: 0 10*3/uL (ref 0.0–0.1)
Basophils Relative: 0 %
Eosinophils Absolute: 0 10*3/uL (ref 0.0–0.5)
Eosinophils Relative: 1 %
HCT: 33.2 % — ABNORMAL LOW (ref 36.0–46.0)
Hemoglobin: 10.4 g/dL — ABNORMAL LOW (ref 12.0–15.0)
Immature Granulocytes: 1 %
Lymphocytes Relative: 22 %
Lymphs Abs: 0.7 10*3/uL (ref 0.7–4.0)
MCH: 28.1 pg (ref 26.0–34.0)
MCHC: 31.3 g/dL (ref 30.0–36.0)
MCV: 89.7 fL (ref 80.0–100.0)
Monocytes Absolute: 0.3 10*3/uL (ref 0.1–1.0)
Monocytes Relative: 10 %
Neutro Abs: 2.2 10*3/uL (ref 1.7–7.7)
Neutrophils Relative %: 66 %
Platelet Count: 104 10*3/uL — ABNORMAL LOW (ref 150–400)
RBC: 3.7 MIL/uL — ABNORMAL LOW (ref 3.87–5.11)
RDW: 14.5 % (ref 11.5–15.5)
WBC Count: 3.4 10*3/uL — ABNORMAL LOW (ref 4.0–10.5)
nRBC: 0 % (ref 0.0–0.2)

## 2024-04-29 DIAGNOSIS — Z741 Need for assistance with personal care: Secondary | ICD-10-CM | POA: Diagnosis not present

## 2024-04-29 DIAGNOSIS — R2681 Unsteadiness on feet: Secondary | ICD-10-CM | POA: Diagnosis not present

## 2024-04-29 DIAGNOSIS — I10 Essential (primary) hypertension: Secondary | ICD-10-CM | POA: Diagnosis not present

## 2024-04-29 DIAGNOSIS — M6281 Muscle weakness (generalized): Secondary | ICD-10-CM | POA: Diagnosis not present

## 2024-04-29 DIAGNOSIS — Z7409 Other reduced mobility: Secondary | ICD-10-CM | POA: Diagnosis not present

## 2024-04-30 DIAGNOSIS — Z741 Need for assistance with personal care: Secondary | ICD-10-CM | POA: Diagnosis not present

## 2024-04-30 DIAGNOSIS — M6281 Muscle weakness (generalized): Secondary | ICD-10-CM | POA: Diagnosis not present

## 2024-04-30 DIAGNOSIS — R2681 Unsteadiness on feet: Secondary | ICD-10-CM | POA: Diagnosis not present

## 2024-04-30 DIAGNOSIS — Z7409 Other reduced mobility: Secondary | ICD-10-CM | POA: Diagnosis not present

## 2024-05-01 DIAGNOSIS — R2681 Unsteadiness on feet: Secondary | ICD-10-CM | POA: Diagnosis not present

## 2024-05-01 DIAGNOSIS — Z741 Need for assistance with personal care: Secondary | ICD-10-CM | POA: Diagnosis not present

## 2024-05-01 DIAGNOSIS — M6281 Muscle weakness (generalized): Secondary | ICD-10-CM | POA: Diagnosis not present

## 2024-05-01 DIAGNOSIS — Z7409 Other reduced mobility: Secondary | ICD-10-CM | POA: Diagnosis not present

## 2024-05-04 DIAGNOSIS — R2681 Unsteadiness on feet: Secondary | ICD-10-CM | POA: Diagnosis not present

## 2024-05-04 DIAGNOSIS — Z7409 Other reduced mobility: Secondary | ICD-10-CM | POA: Diagnosis not present

## 2024-05-04 DIAGNOSIS — Z741 Need for assistance with personal care: Secondary | ICD-10-CM | POA: Diagnosis not present

## 2024-05-04 DIAGNOSIS — M6281 Muscle weakness (generalized): Secondary | ICD-10-CM | POA: Diagnosis not present

## 2024-05-05 DIAGNOSIS — E119 Type 2 diabetes mellitus without complications: Secondary | ICD-10-CM | POA: Diagnosis not present

## 2024-05-05 DIAGNOSIS — I2510 Atherosclerotic heart disease of native coronary artery without angina pectoris: Secondary | ICD-10-CM | POA: Diagnosis not present

## 2024-05-05 DIAGNOSIS — E039 Hypothyroidism, unspecified: Secondary | ICD-10-CM | POA: Diagnosis not present

## 2024-05-05 DIAGNOSIS — Z7409 Other reduced mobility: Secondary | ICD-10-CM | POA: Diagnosis not present

## 2024-05-05 DIAGNOSIS — R2681 Unsteadiness on feet: Secondary | ICD-10-CM | POA: Diagnosis not present

## 2024-05-05 DIAGNOSIS — M6281 Muscle weakness (generalized): Secondary | ICD-10-CM | POA: Diagnosis not present

## 2024-05-05 DIAGNOSIS — Z741 Need for assistance with personal care: Secondary | ICD-10-CM | POA: Diagnosis not present

## 2024-05-05 DIAGNOSIS — E785 Hyperlipidemia, unspecified: Secondary | ICD-10-CM | POA: Diagnosis not present

## 2024-05-06 DIAGNOSIS — R197 Diarrhea, unspecified: Secondary | ICD-10-CM | POA: Diagnosis not present

## 2024-05-06 DIAGNOSIS — E1159 Type 2 diabetes mellitus with other circulatory complications: Secondary | ICD-10-CM | POA: Diagnosis not present

## 2024-05-06 DIAGNOSIS — M6281 Muscle weakness (generalized): Secondary | ICD-10-CM | POA: Diagnosis not present

## 2024-05-06 DIAGNOSIS — Z7409 Other reduced mobility: Secondary | ICD-10-CM | POA: Diagnosis not present

## 2024-05-06 DIAGNOSIS — B351 Tinea unguium: Secondary | ICD-10-CM | POA: Diagnosis not present

## 2024-05-06 DIAGNOSIS — R195 Other fecal abnormalities: Secondary | ICD-10-CM | POA: Diagnosis not present

## 2024-05-06 DIAGNOSIS — Z741 Need for assistance with personal care: Secondary | ICD-10-CM | POA: Diagnosis not present

## 2024-05-06 DIAGNOSIS — R2681 Unsteadiness on feet: Secondary | ICD-10-CM | POA: Diagnosis not present

## 2024-05-07 DIAGNOSIS — Z7409 Other reduced mobility: Secondary | ICD-10-CM | POA: Diagnosis not present

## 2024-05-07 DIAGNOSIS — Z741 Need for assistance with personal care: Secondary | ICD-10-CM | POA: Diagnosis not present

## 2024-05-07 DIAGNOSIS — R2681 Unsteadiness on feet: Secondary | ICD-10-CM | POA: Diagnosis not present

## 2024-05-07 DIAGNOSIS — M6281 Muscle weakness (generalized): Secondary | ICD-10-CM | POA: Diagnosis not present

## 2024-05-08 DIAGNOSIS — M6281 Muscle weakness (generalized): Secondary | ICD-10-CM | POA: Diagnosis not present

## 2024-05-08 DIAGNOSIS — Z741 Need for assistance with personal care: Secondary | ICD-10-CM | POA: Diagnosis not present

## 2024-05-08 DIAGNOSIS — R2681 Unsteadiness on feet: Secondary | ICD-10-CM | POA: Diagnosis not present

## 2024-05-08 DIAGNOSIS — Z7409 Other reduced mobility: Secondary | ICD-10-CM | POA: Diagnosis not present

## 2024-05-10 DIAGNOSIS — Z7409 Other reduced mobility: Secondary | ICD-10-CM | POA: Diagnosis not present

## 2024-05-10 DIAGNOSIS — Z741 Need for assistance with personal care: Secondary | ICD-10-CM | POA: Diagnosis not present

## 2024-05-10 DIAGNOSIS — R2681 Unsteadiness on feet: Secondary | ICD-10-CM | POA: Diagnosis not present

## 2024-05-10 DIAGNOSIS — M6281 Muscle weakness (generalized): Secondary | ICD-10-CM | POA: Diagnosis not present

## 2024-05-11 DIAGNOSIS — Z741 Need for assistance with personal care: Secondary | ICD-10-CM | POA: Diagnosis not present

## 2024-05-11 DIAGNOSIS — I10 Essential (primary) hypertension: Secondary | ICD-10-CM | POA: Diagnosis not present

## 2024-05-11 DIAGNOSIS — Z7409 Other reduced mobility: Secondary | ICD-10-CM | POA: Diagnosis not present

## 2024-05-11 DIAGNOSIS — F32A Depression, unspecified: Secondary | ICD-10-CM | POA: Diagnosis not present

## 2024-05-11 DIAGNOSIS — R2681 Unsteadiness on feet: Secondary | ICD-10-CM | POA: Diagnosis not present

## 2024-05-11 DIAGNOSIS — G4730 Sleep apnea, unspecified: Secondary | ICD-10-CM | POA: Diagnosis not present

## 2024-05-11 DIAGNOSIS — M6281 Muscle weakness (generalized): Secondary | ICD-10-CM | POA: Diagnosis not present

## 2024-05-11 DIAGNOSIS — G4710 Hypersomnia, unspecified: Secondary | ICD-10-CM | POA: Diagnosis not present

## 2024-05-12 DIAGNOSIS — Z7409 Other reduced mobility: Secondary | ICD-10-CM | POA: Diagnosis not present

## 2024-05-12 DIAGNOSIS — Z741 Need for assistance with personal care: Secondary | ICD-10-CM | POA: Diagnosis not present

## 2024-05-12 DIAGNOSIS — M6281 Muscle weakness (generalized): Secondary | ICD-10-CM | POA: Diagnosis not present

## 2024-05-12 DIAGNOSIS — R2681 Unsteadiness on feet: Secondary | ICD-10-CM | POA: Diagnosis not present

## 2024-05-13 DIAGNOSIS — Z741 Need for assistance with personal care: Secondary | ICD-10-CM | POA: Diagnosis not present

## 2024-05-13 DIAGNOSIS — Z7409 Other reduced mobility: Secondary | ICD-10-CM | POA: Diagnosis not present

## 2024-05-13 DIAGNOSIS — M6281 Muscle weakness (generalized): Secondary | ICD-10-CM | POA: Diagnosis not present

## 2024-05-13 DIAGNOSIS — R2681 Unsteadiness on feet: Secondary | ICD-10-CM | POA: Diagnosis not present

## 2024-05-14 DIAGNOSIS — M6281 Muscle weakness (generalized): Secondary | ICD-10-CM | POA: Diagnosis not present

## 2024-05-14 DIAGNOSIS — Z7409 Other reduced mobility: Secondary | ICD-10-CM | POA: Diagnosis not present

## 2024-05-14 DIAGNOSIS — R2681 Unsteadiness on feet: Secondary | ICD-10-CM | POA: Diagnosis not present

## 2024-05-14 DIAGNOSIS — Z741 Need for assistance with personal care: Secondary | ICD-10-CM | POA: Diagnosis not present

## 2024-05-18 DIAGNOSIS — R2681 Unsteadiness on feet: Secondary | ICD-10-CM | POA: Diagnosis not present

## 2024-05-18 DIAGNOSIS — Z741 Need for assistance with personal care: Secondary | ICD-10-CM | POA: Diagnosis not present

## 2024-05-18 DIAGNOSIS — Z7409 Other reduced mobility: Secondary | ICD-10-CM | POA: Diagnosis not present

## 2024-05-18 DIAGNOSIS — M6281 Muscle weakness (generalized): Secondary | ICD-10-CM | POA: Diagnosis not present

## 2024-05-19 DIAGNOSIS — M6281 Muscle weakness (generalized): Secondary | ICD-10-CM | POA: Diagnosis not present

## 2024-05-19 DIAGNOSIS — Z741 Need for assistance with personal care: Secondary | ICD-10-CM | POA: Diagnosis not present

## 2024-05-19 DIAGNOSIS — R2681 Unsteadiness on feet: Secondary | ICD-10-CM | POA: Diagnosis not present

## 2024-05-19 DIAGNOSIS — Z7409 Other reduced mobility: Secondary | ICD-10-CM | POA: Diagnosis not present

## 2024-05-20 DIAGNOSIS — R2681 Unsteadiness on feet: Secondary | ICD-10-CM | POA: Diagnosis not present

## 2024-05-20 DIAGNOSIS — Z7409 Other reduced mobility: Secondary | ICD-10-CM | POA: Diagnosis not present

## 2024-05-20 DIAGNOSIS — M6281 Muscle weakness (generalized): Secondary | ICD-10-CM | POA: Diagnosis not present

## 2024-05-20 DIAGNOSIS — Z741 Need for assistance with personal care: Secondary | ICD-10-CM | POA: Diagnosis not present

## 2024-05-21 DIAGNOSIS — Z741 Need for assistance with personal care: Secondary | ICD-10-CM | POA: Diagnosis not present

## 2024-05-21 DIAGNOSIS — R197 Diarrhea, unspecified: Secondary | ICD-10-CM | POA: Diagnosis not present

## 2024-05-21 DIAGNOSIS — H04129 Dry eye syndrome of unspecified lacrimal gland: Secondary | ICD-10-CM | POA: Diagnosis not present

## 2024-05-21 DIAGNOSIS — M6281 Muscle weakness (generalized): Secondary | ICD-10-CM | POA: Diagnosis not present

## 2024-05-21 DIAGNOSIS — I509 Heart failure, unspecified: Secondary | ICD-10-CM | POA: Diagnosis not present

## 2024-05-21 DIAGNOSIS — R2681 Unsteadiness on feet: Secondary | ICD-10-CM | POA: Diagnosis not present

## 2024-05-21 DIAGNOSIS — Z7409 Other reduced mobility: Secondary | ICD-10-CM | POA: Diagnosis not present

## 2024-05-21 DIAGNOSIS — I10 Essential (primary) hypertension: Secondary | ICD-10-CM | POA: Diagnosis not present

## 2024-05-22 DIAGNOSIS — Z7409 Other reduced mobility: Secondary | ICD-10-CM | POA: Diagnosis not present

## 2024-05-22 DIAGNOSIS — M6281 Muscle weakness (generalized): Secondary | ICD-10-CM | POA: Diagnosis not present

## 2024-05-22 DIAGNOSIS — R2681 Unsteadiness on feet: Secondary | ICD-10-CM | POA: Diagnosis not present

## 2024-05-22 DIAGNOSIS — Z741 Need for assistance with personal care: Secondary | ICD-10-CM | POA: Diagnosis not present

## 2024-05-25 DIAGNOSIS — Z7409 Other reduced mobility: Secondary | ICD-10-CM | POA: Diagnosis not present

## 2024-05-25 DIAGNOSIS — E119 Type 2 diabetes mellitus without complications: Secondary | ICD-10-CM | POA: Diagnosis not present

## 2024-05-25 DIAGNOSIS — M6281 Muscle weakness (generalized): Secondary | ICD-10-CM | POA: Diagnosis not present

## 2024-05-25 DIAGNOSIS — R2681 Unsteadiness on feet: Secondary | ICD-10-CM | POA: Diagnosis not present

## 2024-05-25 DIAGNOSIS — Z713 Dietary counseling and surveillance: Secondary | ICD-10-CM | POA: Diagnosis not present

## 2024-05-25 DIAGNOSIS — I10 Essential (primary) hypertension: Secondary | ICD-10-CM | POA: Diagnosis not present

## 2024-05-25 DIAGNOSIS — Z741 Need for assistance with personal care: Secondary | ICD-10-CM | POA: Diagnosis not present

## 2024-05-25 DIAGNOSIS — R197 Diarrhea, unspecified: Secondary | ICD-10-CM | POA: Diagnosis not present

## 2024-05-26 DIAGNOSIS — Z7409 Other reduced mobility: Secondary | ICD-10-CM | POA: Diagnosis not present

## 2024-05-26 DIAGNOSIS — Z961 Presence of intraocular lens: Secondary | ICD-10-CM | POA: Diagnosis not present

## 2024-05-26 DIAGNOSIS — M6281 Muscle weakness (generalized): Secondary | ICD-10-CM | POA: Diagnosis not present

## 2024-05-26 DIAGNOSIS — H524 Presbyopia: Secondary | ICD-10-CM | POA: Diagnosis not present

## 2024-05-26 DIAGNOSIS — Z741 Need for assistance with personal care: Secondary | ICD-10-CM | POA: Diagnosis not present

## 2024-05-26 DIAGNOSIS — H04123 Dry eye syndrome of bilateral lacrimal glands: Secondary | ICD-10-CM | POA: Diagnosis not present

## 2024-05-26 DIAGNOSIS — R2681 Unsteadiness on feet: Secondary | ICD-10-CM | POA: Diagnosis not present

## 2024-05-26 DIAGNOSIS — E119 Type 2 diabetes mellitus without complications: Secondary | ICD-10-CM | POA: Diagnosis not present

## 2024-05-27 DIAGNOSIS — Z7409 Other reduced mobility: Secondary | ICD-10-CM | POA: Diagnosis not present

## 2024-05-27 DIAGNOSIS — R2681 Unsteadiness on feet: Secondary | ICD-10-CM | POA: Diagnosis not present

## 2024-05-27 DIAGNOSIS — Z741 Need for assistance with personal care: Secondary | ICD-10-CM | POA: Diagnosis not present

## 2024-05-27 DIAGNOSIS — M6281 Muscle weakness (generalized): Secondary | ICD-10-CM | POA: Diagnosis not present

## 2024-05-28 DIAGNOSIS — Z741 Need for assistance with personal care: Secondary | ICD-10-CM | POA: Diagnosis not present

## 2024-05-28 DIAGNOSIS — R2681 Unsteadiness on feet: Secondary | ICD-10-CM | POA: Diagnosis not present

## 2024-05-28 DIAGNOSIS — M6281 Muscle weakness (generalized): Secondary | ICD-10-CM | POA: Diagnosis not present

## 2024-05-28 DIAGNOSIS — Z7409 Other reduced mobility: Secondary | ICD-10-CM | POA: Diagnosis not present

## 2024-06-01 DIAGNOSIS — M6281 Muscle weakness (generalized): Secondary | ICD-10-CM | POA: Diagnosis not present

## 2024-06-01 DIAGNOSIS — R2681 Unsteadiness on feet: Secondary | ICD-10-CM | POA: Diagnosis not present

## 2024-06-01 DIAGNOSIS — Z741 Need for assistance with personal care: Secondary | ICD-10-CM | POA: Diagnosis not present

## 2024-06-01 DIAGNOSIS — Z7409 Other reduced mobility: Secondary | ICD-10-CM | POA: Diagnosis not present

## 2024-06-01 NOTE — Progress Notes (Signed)
 [facility] [address] [place],  [place]  [address] [phone]  Clinic Day:  06/22/2024   Referring physician: Gayl Males, MD  Patient Care Team: Patient Care Team: [name], DO as PCP - General Monetta Redell PARAS, MD as PCP - Cardiology (Cardiology) Neysa Reggy BIRCH, MD as Consulting Physician (Pulmonary Disease) Monetta Redell PARAS, MD as Consulting Physician (Cardiology) Towana Charleston, MD (Gastroenterology) Marda General, MD as Consulting Physician (Urology) Ival Domino, FNP as Nurse Practitioner (Family Medicine)   REASON FOR CONSULTATION:  Pancytopenia  HISTORY OF PRESENT ILLNESS:   [name] [name] is a 78 y.o. female with a history of pancytopenia who is referred in consultation by Domino Ival, FNP for assessment and management. She was evaluated by her PCP for routine follow up of medical conditions and was found to have abnormal CBC with WBC 2.6, Hgb 10.1, platelets 77. She denies any overt symptoms other than feeling slight fatigue. She denies fever, chills, nausea or vomiting. She denies shortness of breath, chest pain or cough. She denies issue with bowel or bladder. Medical history includes Type 2 DM, hyperlipidemia, atherosclerotic heart disease, hypothyroidism, GERD, depression and essential hypertension.Family history is significant for emphysema, RA, heart disease, uterine, cervical and vulvar cancer, COPD, bipolar, stroke heart attack and lupus.Surgical history consists of appendectomy,  cardiac cath, cataract removal,  cystoscopy, D&C, knee arthroscopy, posterior lumbar fusion,  and TAH.  REVIEW OF SYSTEMS:   Review of Systems  Constitutional: Negative.   HENT:  Negative.    Eyes: Negative.   Respiratory: Negative.    Cardiovascular: Negative.   Gastrointestinal: Negative.   Endocrine: Negative.   Genitourinary: Negative.    Musculoskeletal: Negative.   Skin: Negative.   Neurological: Negative.   Hematological: Negative.    Psychiatric/Behavioral: Negative.       VITALS:   Blood pressure 122/65, pulse 84, resp. rate 14, height 5' 3 (1.6 m), weight 195 lb 1.6 oz (88.5 kg), SpO2 100%.  Wt Readings from Last 3 Encounters:  04/28/24 198 lb (89.8 kg)  04/15/24 195 lb 1.6 oz (88.5 kg)  02/10/24 191 lb (86.6 kg)    Body mass index is 34.56 kg/m.  Performance status (ECOG): 1 - Symptomatic but completely ambulatory  PHYSICAL EXAM:   Physical Exam Vitals reviewed.  Constitutional:      Appearance: Normal appearance. She is obese.  HENT:     Head: Normocephalic and atraumatic.     Mouth/Throat:     Mouth: Mucous membranes are moist.  Cardiovascular:     Rate and Rhythm: Normal rate and regular rhythm.     Pulses: Normal pulses.     Heart sounds: Normal heart sounds.  Pulmonary:     Effort: Pulmonary effort is normal.     Breath sounds: Normal breath sounds.  Abdominal:     General: Bowel sounds are normal.     Palpations: Abdomen is soft.  Musculoskeletal:        General: Normal range of motion.     Cervical back: Normal range of motion.  Skin:    General: Skin is warm and dry.  Neurological:     General: No focal deficit present.     Mental Status: She is alert and oriented to person, place, and time. Mental status is at baseline.  Psychiatric:        Mood and Affect: Mood normal.        Behavior: Behavior normal.        Thought Content: Thought content  normal.        Judgment: Judgment normal.      LABS:      Latest Ref Rng & Units 04/28/2024    2:35 PM 04/15/2024    3:28 PM 10/24/2023   12:24 PM  CBC  WBC 4.0 - 10.5 K/uL 3.4  2.9    Hemoglobin 12.0 - 15.0 g/dL 89.5  89.7  87.3   Hematocrit 36.0 - 46.0 % 33.2  31.9  37.0   Platelets 150 - 400 K/uL 104  116        Latest Ref Rng & Units 04/15/2024    3:28 PM 10/24/2023   12:24 PM 01/20/2019   10:15 AM  CMP  Glucose 70 - 99 mg/dL 775  836  646   BUN 8 - 23 mg/dL 15  10  15    Creatinine 0.44 - 1.00 mg/dL 9.05  9.19  9.12    Sodium 135 - 145 mmol/L 137  138  133   Potassium 3.5 - 5.1 mmol/L 3.9  3.8  4.1   Chloride 98 - 111 mmol/L 100  101  96   CO2 22 - 32 mmol/L 24   20   Calcium  8.9 - 10.3 mg/dL 9.7   9.8   Total Protein 6.5 - 8.1 g/dL 6.8     Total Bilirubin 0.0 - 1.2 mg/dL 0.4     Alkaline Phos 38 - 126 U/L 100     AST 15 - 41 U/L 23     ALT 0 - 44 U/L 15        No results found for: CEA1, CEA / No results found for: CEA1, CEA No results found for: PSA1 No results found for: CAN199 No results found for: RJW874  Lab Results  Component Value Date   TOTALPROTELP 6.3 04/15/2024   Lab Results  Component Value Date   TIBC 295 04/15/2024   FERRITIN 48 04/15/2024   IRONPCTSAT 15 04/15/2024   Lab Results  Component Value Date   LDH 137 04/15/2024    STUDIES:   No results found.    HISTORY:   Past Medical History:  Diagnosis Date   Acquired bilateral foot drop    use braces   Arthritis    Chronic atrial fibrillation [facility])    cardiologist--- dr monetta;  pt not on anticoagulation ,  03-21-2016  s/p  watchmen left atrial appendage closure device   Chronic diastolic heart failure (HCC)    followed by cardiology   Cirrhosis, non-alcoholic (HCC)    followed by GI ;   Dr. Towana in [place]   Edema of both lower extremities    Fibromyalgia    History of MI (myocardial infarction)    age 43   HOH (hard of hearing)    per pt residual post covid 2021   HTN (hypertension)    on medication since age 38   Hyperlipidemia, mixed    Hypothyroidism    Irritable bowel syndrome with diarrhea    MDD (major depressive disorder)    Mild CAD 02/29/2016   CCT morph/ CTA---   01-27-2019   score= 240 involving LAD/ LCx  nonobstructive   Neuropathy, peripheral    lower extremities   OSA (obstructive sleep apnea)    followed by dr c. young;   (10-22-2023  lov note in epic 09-09-2023  pt is getting set up to titrate due to intolerate to current cpap)   Presence of Watchman left atrial  appendage closure device 03/21/2016  done at [facility] by dr j. craig due to pt high risk anticoagulation secondary to chronic cirrhosis   Seasonal and perennial allergic rhinitis    Somnolence    Thrombocytopenia (HCC)    Type 2 diabetes mellitus treated with insulin  [facility])    endocrinologist-- dr faythe   (10-22-2023  checks multiple times w/ CGM (Libre 2)  fasting average  150-160)   Urinary, incontinence, stress female    wears depends   Uses walker    due to bilateral foot drop    Past Surgical History:  Procedure Laterality Date   APPENDECTOMY  1998   open   CARDIAC CATHETERIZATION  1999   @[facility]    CARPAL TUNNEL RELEASE Right 1985   CATARACT EXTRACTION W/ INTRAOCULAR LENS  IMPLANT, BILATERAL Bilateral 2017   CYSTOSCOPY N/A 10/24/2023   Procedure: CYSTOSCOPY WITH BLADDER BIOPSY;  Surgeon: Guadlupe Lianne DASEN, MD;  Location: [facility];  Service: Gynecology;  Laterality: N/A;  with biopsy; needs Bugbee and endoscopic cup biopsy forcep   DILATION AND CURETTAGE OF UTERUS     yrs ago   KNEE ARTHROSCOPY Right 02/17/2004   @[facility]  by dr whitfield   LEFT ATRIAL APPENDAGE OCCLUSION  03/21/2016   @[facility] by dr jinny. craig;   closure device   POSTERIOR LUMBAR FUSION  04/11/2011   @[facility]   by dr alix;   L4--5   laminectomy/  disectomy/  decompression   POSTERIOR LUMBAR FUSION  01/24/2012   @[facility]  by dr alix;   L5--S1;   laminectomy/ decompression/  disectomy   TONSILLECTOMY AND ADENOIDECTOMY  1969   TOTAL ABDOMINAL HYSTERECTOMY W/ BILATERAL SALPINGOOPHORECTOMY Bilateral 1998    Family History  Problem Relation Age of Onset   Emphysema Mother    Rheum arthritis Mother    Anesthesia problems Mother    Uterine cancer Mother    Cervical cancer Mother    Vaginal cancer Mother    COPD Mother    Bipolar disorder Mother    Heart attack Father    Stroke Father    CAD Father    Hypertension Father    Lupus Sister    Diabetes Paternal Grandmother     Social History:   reports that she has never smoked. She has never used smokeless tobacco. She reports that she does not drink alcohol  and does not use drugs.The patient is alone  today.  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     buPROPion  (WELLBUTRIN  XL) 300 MG 24 hr tablet Take 300 mg by mouth daily.     doxazosin  (CARDURA ) 4 MG tablet  Take 1 tablet (4 mg total) by mouth daily. 90 tablet 3   ferrous sulfate 325 (65 FE) MG tablet Take 325 mg by mouth daily with breakfast.     folic acid  (FOLVITE ) 1 MG tablet Take 1 mg by mouth daily.     furosemide  (LASIX ) 20 MG tablet TAKE 1 TABLET BY MOUTH DAILY ONLY ON MONDAYS, WEDNESDAYS AND FRIDAYS 36 tablet 3   gabapentin  (NEURONTIN ) 600 MG tablet Take 600 mg by mouth 4 (four) times daily.     hydrOXYzine  (ATARAX ) 25 MG tablet Take 25 mg by mouth at bedtime.     irbesartan  (AVAPRO ) 300 MG tablet Take 300 mg by mouth daily.     isosorbide  mononitrate (IMDUR ) 60 MG 24 hr tablet Take 60 mg by mouth daily.     levothyroxine (SYNTHROID, LEVOTHROID) 125 MCG tablet Take 125 mcg by mouth daily before  breakfast.      LORazepam (ATIVAN) 0.5 MG tablet Take 0.5 mg by mouth every 6 (six) hours as needed for anxiety.     metFORMIN (GLUCOPHAGE-XR) 500 MG 24 hr tablet Take 1,500 mg by mouth every evening.     nystatin cream (MYCOSTATIN) Apply 1 Application topically 2 (two) times daily.     omeprazole (PRILOSEC) 40 MG capsule Take 40 mg by mouth 2 (two) times daily.     potassium chloride  SA (K-DUR,KLOR-CON ) 20 MEQ tablet Take 20 mEq by mouth daily.     rosuvastatin  (CRESTOR ) 5 MG tablet Take 1 tablet (5 mg total) by mouth daily. 90 tablet 3   traMADol (ULTRAM) 50 MG tablet Take 50 mg by mouth every 6 (six) hours as needed for moderate pain or severe pain. For pain     trimethoprim (TRIMPEX) 100 MG tablet Take 100 mg by mouth daily.     venlafaxine  XR (EFFEXOR -XR) 150 MG 24 hr capsule Take 150 mg by mouth daily with breakfast.      Vitamin D, Ergocalciferol, (DRISDOL) 1.25 MG (50000 UNIT) CAPS capsule Take 50,000 Units by mouth once a week.     amLODipine (NORVASC) 5 MG tablet Take 2.5 mg by mouth 2 (two) times daily.     NITROSTAT  0.4 MG SL tablet DISSOLVE 1 TABLET UNDER THE TONGUE AS NEEDED FOR CHEST PAIN. 25 tablet 1   No current facility-administered medications for this visit.     ASSESSMENT & PLAN:   Assessment:  [name] [name] is a 78 y.o. female who was recently found to be pancytopenic on a routine visit with her PCP. She denies any overt symptoms other than some fatigue. She has had abnormal CBC with slight cytopenias noted since 2017. We discussed the trend of her labs as well as today's results of WBC 2.9, Hgb 10.2 and platelets 116. We will assess for any underlying diseases today including iron deficiency.   Plan: 1.  She will return in 2 weeks for repeat evaluation and results of pending labs.   I discussed the assessment and treatment plan with the patient.  The patient was provided an opportunity to ask questions and all were answered.  The patient agreed with the plan and  demonstrated an understanding of the instructions.    Thank you for the referral    45 minutes was spent in patient care.  This included time spent preparing to see the patient (e.g., review of tests), obtaining and/or reviewing separately obtained history, counseling and educating the patient/family/caregiver, ordering medications, tests, or procedures; documenting clinical information in the electronic or other health record,  independently interpreting results and communicating results to the patient/family/caregiver as well as coordination of care.      Eleanor DELENA Bach, NP   Family Nurse Practitioner - Board Certified [facility] [place] [phone]

## 2024-06-02 DIAGNOSIS — Z741 Need for assistance with personal care: Secondary | ICD-10-CM | POA: Diagnosis not present

## 2024-06-02 DIAGNOSIS — M6281 Muscle weakness (generalized): Secondary | ICD-10-CM | POA: Diagnosis not present

## 2024-06-02 DIAGNOSIS — R2681 Unsteadiness on feet: Secondary | ICD-10-CM | POA: Diagnosis not present

## 2024-06-02 DIAGNOSIS — Z7409 Other reduced mobility: Secondary | ICD-10-CM | POA: Diagnosis not present

## 2024-06-03 DIAGNOSIS — R2681 Unsteadiness on feet: Secondary | ICD-10-CM | POA: Diagnosis not present

## 2024-06-03 DIAGNOSIS — Z741 Need for assistance with personal care: Secondary | ICD-10-CM | POA: Diagnosis not present

## 2024-06-03 DIAGNOSIS — M6281 Muscle weakness (generalized): Secondary | ICD-10-CM | POA: Diagnosis not present

## 2024-06-03 DIAGNOSIS — Z7409 Other reduced mobility: Secondary | ICD-10-CM | POA: Diagnosis not present

## 2024-06-04 DIAGNOSIS — M6281 Muscle weakness (generalized): Secondary | ICD-10-CM | POA: Diagnosis not present

## 2024-06-04 DIAGNOSIS — Z741 Need for assistance with personal care: Secondary | ICD-10-CM | POA: Diagnosis not present

## 2024-06-04 DIAGNOSIS — R2681 Unsteadiness on feet: Secondary | ICD-10-CM | POA: Diagnosis not present

## 2024-06-04 DIAGNOSIS — Z7409 Other reduced mobility: Secondary | ICD-10-CM | POA: Diagnosis not present

## 2024-06-05 DIAGNOSIS — Z7409 Other reduced mobility: Secondary | ICD-10-CM | POA: Diagnosis not present

## 2024-06-05 DIAGNOSIS — Z741 Need for assistance with personal care: Secondary | ICD-10-CM | POA: Diagnosis not present

## 2024-06-05 DIAGNOSIS — R2681 Unsteadiness on feet: Secondary | ICD-10-CM | POA: Diagnosis not present

## 2024-06-05 DIAGNOSIS — M6281 Muscle weakness (generalized): Secondary | ICD-10-CM | POA: Diagnosis not present

## 2024-06-08 DIAGNOSIS — R2681 Unsteadiness on feet: Secondary | ICD-10-CM | POA: Diagnosis not present

## 2024-06-08 DIAGNOSIS — M6281 Muscle weakness (generalized): Secondary | ICD-10-CM | POA: Diagnosis not present

## 2024-06-08 DIAGNOSIS — Z7409 Other reduced mobility: Secondary | ICD-10-CM | POA: Diagnosis not present

## 2024-06-08 DIAGNOSIS — Z741 Need for assistance with personal care: Secondary | ICD-10-CM | POA: Diagnosis not present

## 2024-06-09 DIAGNOSIS — R2681 Unsteadiness on feet: Secondary | ICD-10-CM | POA: Diagnosis not present

## 2024-06-09 DIAGNOSIS — M6281 Muscle weakness (generalized): Secondary | ICD-10-CM | POA: Diagnosis not present

## 2024-06-09 DIAGNOSIS — Z7409 Other reduced mobility: Secondary | ICD-10-CM | POA: Diagnosis not present

## 2024-06-09 DIAGNOSIS — Z741 Need for assistance with personal care: Secondary | ICD-10-CM | POA: Diagnosis not present

## 2024-06-10 DIAGNOSIS — Z7409 Other reduced mobility: Secondary | ICD-10-CM | POA: Diagnosis not present

## 2024-06-10 DIAGNOSIS — R2681 Unsteadiness on feet: Secondary | ICD-10-CM | POA: Diagnosis not present

## 2024-06-10 DIAGNOSIS — M6281 Muscle weakness (generalized): Secondary | ICD-10-CM | POA: Diagnosis not present

## 2024-06-10 DIAGNOSIS — Z741 Need for assistance with personal care: Secondary | ICD-10-CM | POA: Diagnosis not present

## 2024-06-11 DIAGNOSIS — M6281 Muscle weakness (generalized): Secondary | ICD-10-CM | POA: Diagnosis not present

## 2024-06-11 DIAGNOSIS — Z741 Need for assistance with personal care: Secondary | ICD-10-CM | POA: Diagnosis not present

## 2024-06-11 DIAGNOSIS — Z7409 Other reduced mobility: Secondary | ICD-10-CM | POA: Diagnosis not present

## 2024-06-11 DIAGNOSIS — E669 Obesity, unspecified: Secondary | ICD-10-CM | POA: Diagnosis not present

## 2024-06-11 DIAGNOSIS — R2681 Unsteadiness on feet: Secondary | ICD-10-CM | POA: Diagnosis not present

## 2024-06-11 DIAGNOSIS — G4730 Sleep apnea, unspecified: Secondary | ICD-10-CM | POA: Diagnosis not present

## 2024-06-12 DIAGNOSIS — M6281 Muscle weakness (generalized): Secondary | ICD-10-CM | POA: Diagnosis not present

## 2024-06-12 DIAGNOSIS — G4700 Insomnia, unspecified: Secondary | ICD-10-CM | POA: Diagnosis not present

## 2024-06-12 DIAGNOSIS — R2681 Unsteadiness on feet: Secondary | ICD-10-CM | POA: Diagnosis not present

## 2024-06-12 DIAGNOSIS — Z7409 Other reduced mobility: Secondary | ICD-10-CM | POA: Diagnosis not present

## 2024-06-12 DIAGNOSIS — Z741 Need for assistance with personal care: Secondary | ICD-10-CM | POA: Diagnosis not present

## 2024-06-15 DIAGNOSIS — M6281 Muscle weakness (generalized): Secondary | ICD-10-CM | POA: Diagnosis not present

## 2024-06-15 DIAGNOSIS — R2681 Unsteadiness on feet: Secondary | ICD-10-CM | POA: Diagnosis not present

## 2024-06-15 DIAGNOSIS — Z741 Need for assistance with personal care: Secondary | ICD-10-CM | POA: Diagnosis not present

## 2024-06-15 DIAGNOSIS — Z7409 Other reduced mobility: Secondary | ICD-10-CM | POA: Diagnosis not present

## 2024-06-16 DIAGNOSIS — Z741 Need for assistance with personal care: Secondary | ICD-10-CM | POA: Diagnosis not present

## 2024-06-16 DIAGNOSIS — Z7409 Other reduced mobility: Secondary | ICD-10-CM | POA: Diagnosis not present

## 2024-06-16 DIAGNOSIS — R2681 Unsteadiness on feet: Secondary | ICD-10-CM | POA: Diagnosis not present

## 2024-06-16 DIAGNOSIS — M6281 Muscle weakness (generalized): Secondary | ICD-10-CM | POA: Diagnosis not present

## 2024-06-17 DIAGNOSIS — R2681 Unsteadiness on feet: Secondary | ICD-10-CM | POA: Diagnosis not present

## 2024-06-17 DIAGNOSIS — Z741 Need for assistance with personal care: Secondary | ICD-10-CM | POA: Diagnosis not present

## 2024-06-17 DIAGNOSIS — Z7409 Other reduced mobility: Secondary | ICD-10-CM | POA: Diagnosis not present

## 2024-06-17 DIAGNOSIS — M6281 Muscle weakness (generalized): Secondary | ICD-10-CM | POA: Diagnosis not present

## 2024-06-23 DIAGNOSIS — E039 Hypothyroidism, unspecified: Secondary | ICD-10-CM | POA: Diagnosis not present

## 2024-06-23 DIAGNOSIS — I2510 Atherosclerotic heart disease of native coronary artery without angina pectoris: Secondary | ICD-10-CM | POA: Diagnosis not present

## 2024-06-23 DIAGNOSIS — J4520 Mild intermittent asthma, uncomplicated: Secondary | ICD-10-CM | POA: Diagnosis not present

## 2024-06-23 DIAGNOSIS — E119 Type 2 diabetes mellitus without complications: Secondary | ICD-10-CM | POA: Diagnosis not present

## 2024-06-23 DIAGNOSIS — G4733 Obstructive sleep apnea (adult) (pediatric): Secondary | ICD-10-CM | POA: Diagnosis not present

## 2024-06-23 DIAGNOSIS — E785 Hyperlipidemia, unspecified: Secondary | ICD-10-CM | POA: Diagnosis not present

## 2024-07-13 NOTE — Progress Notes (Deleted)
 HPI F never smoker, followed for OSA, chronic bronchitis, allergic rhinitis, complicated by nonalcoholic cirrhosis/Dr. Towana GI,   A. Fib/Watchman clot- preventing device placed in her atrial appendage , HBP, MI, chronic diastolic CHF, GERD, DM 2, hypothyroid NPSG 11/05/01-AHI 93/hour, desaturation to 63%, body weight 217 pounds PFT 07/26/09-mild restriction and reduction of diffusion. No obstruction, no response to dilator -----------------------------------------------------------------------------------   09/04/22-  78 year old female never smoker followed for OSA, chronic bronchitis, allergic rhinitis, complicated by nonalcoholic Cirrhosis/Dr. Towana GI,  AFib/Watchman clot- preventing device placed in her atrial appendage, HTN, MI, chronic diastolic CHF, GERD, DM 2, hypothyroid, Hx Splenectomy, CPAP auto 5-15/APS/[employer]    Download- compliance 67%, AHI 3.7/ hr Body weight today 194 lbs Covid vax-   4 Moderna                                    Flu vax- We are out of senior strength today Download reviewed.  Mask leaks and less strapped on very tight.  She is interested in mask fitting referral.  Sleep is disturbed by frequent nocturia-interstitial cystitis.  Uses rolling walker and no longer drives herself.  Depends on husband's own health is not good. Chronic bronchitis symptoms have almost completely disappeared.  She no longer uses any inhalers and feels her breathing is comfortable. Being actively managed for high blood pressure.  07/14/24- 78 year old female never smoker followed for OSA, chronic bronchitis, allergic rhinitis, complicated by nonalcoholic Cirrhosis/Dr. Towana GI,  AFib/Watchman clot- preventing device placed in her atrial appendage, HTN, MI, chronic diastolic CHF, GERD, DM 2, hypothyroid, Hx Splenectomy, CPAP auto 5-15/APS/[employer]    Download- compliance  Body weight today  Saw TP in October, 2024- CPAP wasn't working right, couldn't get mask to fit, dropped off.  CPAP titration ordered but apparently not done.   ROS-see HPI  + = positive Constitutional:   No-   weight loss, night sweats, fevers, chills, + fatigue, lassitude. HEENT:   No-  headaches, difficulty swallowing, tooth/dental problems, sore throat,       No-  sneezing, itching, ear ache, nasal congestion, post nasal drip,  CV:  No-   chest pain, orthopnea, PND, swelling in lower extremities, anasarca, dizziness, palpitations Resp:+ shortness of breath with exertion or at rest.             productive cough,  + non-productive cough,  No- coughing up of blood.              No-   change in color of mucus.  No- wheezing.   Skin: No-   rash or lesions. GI:  No-   heartburn, indigestion, abdominal pain, nausea, vomiting,  GU:  MS:  + joint pain or swelling. + back pain. Neuro-     nothing unusual Psych:  No- change in mood or affect. No depression or anxiety.  No memory loss.  OBJ- Physical Exam General- Alert, Oriented, Affect-appropriate, Distress- none acute, + rolling walker + obesity Skin-  + bleeding mole R cheek she associates with CPAP headgear. Lymphadenopathy- none Head- atraumatic            Eyes- Gross vision intact, PERRLA, conjunctivae and secretions clear            Ears- Hearing, canals-normal            Nose- Clear, no-Septal dev, mucus, polyps, erosion, perforation  Throat- Mallampati II-III , mucosa clear , drainage- none, tonsils- atrophic Neck- flexible , trachea midline, no stridor , thyroid  nl, carotid no bruit Chest - symmetrical excursion , unlabored           Heart/CV- IRR/Afib , no murmur , no gallop  , no rub, nl s1 s2                           - JVD- none , edema- none, stasis changes +, varices- none           Lung- +few crackles, wheeze- none, cough- none , dullness-none, rub- none           Chest wall-  Abd-  Br/ Gen/ Rectal- Not done, not indicated Extrem- +braces on ankles Neuro- grossly intact to observation

## 2024-07-14 ENCOUNTER — Ambulatory Visit: Admitting: Internal Medicine

## 2024-07-16 DIAGNOSIS — Z961 Presence of intraocular lens: Secondary | ICD-10-CM | POA: Diagnosis not present

## 2024-07-16 DIAGNOSIS — H04123 Dry eye syndrome of bilateral lacrimal glands: Secondary | ICD-10-CM | POA: Diagnosis not present

## 2024-07-21 DIAGNOSIS — R410 Disorientation, unspecified: Secondary | ICD-10-CM | POA: Diagnosis not present

## 2024-07-23 DIAGNOSIS — N390 Urinary tract infection, site not specified: Secondary | ICD-10-CM | POA: Diagnosis not present

## 2024-08-03 DIAGNOSIS — I10 Essential (primary) hypertension: Secondary | ICD-10-CM | POA: Diagnosis not present

## 2024-08-03 DIAGNOSIS — I2510 Atherosclerotic heart disease of native coronary artery without angina pectoris: Secondary | ICD-10-CM | POA: Diagnosis not present

## 2024-08-03 DIAGNOSIS — N390 Urinary tract infection, site not specified: Secondary | ICD-10-CM | POA: Diagnosis not present

## 2024-08-03 DIAGNOSIS — F32A Depression, unspecified: Secondary | ICD-10-CM | POA: Diagnosis not present

## 2024-08-05 DIAGNOSIS — R400 Somnolence: Secondary | ICD-10-CM | POA: Diagnosis not present

## 2024-08-05 DIAGNOSIS — J4520 Mild intermittent asthma, uncomplicated: Secondary | ICD-10-CM | POA: Diagnosis not present

## 2024-08-05 DIAGNOSIS — R5383 Other fatigue: Secondary | ICD-10-CM | POA: Diagnosis not present

## 2024-08-05 DIAGNOSIS — G4733 Obstructive sleep apnea (adult) (pediatric): Secondary | ICD-10-CM | POA: Diagnosis not present

## 2024-08-07 NOTE — Progress Notes (Signed)
 Cardiology Office Note:  .   Date:  08/12/2024  ID:  [name] [name], DOB [birth year], MRN [id number] PCP: [name], DO  [facility] Providers Cardiologist:  Redell Leiter, MD    History of Present Illness: .   [name] [name] is a 78 y.o. female  with a past medical history of CAD, HFpEF, permanent atrial fibrillation s/p Watchman device, hypertension, OSA, chronic liver disease with idiopathic cirrhosis, GERD, DM2, moderate mitral regurgitation, hypothyroidism, recurrent UTIs, fibromyalgia, dyslipidemia.  02/26/2024 echo EF 60-65%, moderate LVH, LA severely dilated, RA mild-moderately dilated, mild to moderate MR 05/03/2021 echo EF technically difficult study, normal LV size and systolic function, LA severely dilated, mild MR, mild TR 01/27/2019 coronary CTA calcium  score 240, 81st percentile, mild mixed atherosclerotic plaque in the proximal RCA with 25-49% stenosis, minimal mixed plaque in the LM less than 25%, 25 to 49% in the LAD 2017 Watchman device at [facility]  She established care in 2017 with Dr. Leiter for the management of her atrial fibrillation.  That same year she had a Watchman device placed at [facility] with left atrial appendage closure.  Most recently evaluated by Dr. Leiter on 12/25/2022, her rate was controlled.  She was doing well from a cardiac perspective, no changes were made to her plan of care and she was advised to follow-up in 6 months.  In 2020 she had a coronary CTA revealing a calcium  score 240, placing her in the 81st percentile.  Most recently evaluated by myself on 02/10/2024, doing stable from a cardiac perspective, ambulating with a rollator and relatively sedentary, also the caregiver for her husband, who suffers with Alzheimer's disease, we repeated an echocardiogram revealing an EF of 60 to 65% and mild to moderate mitral regurgitation.  She presents today accompanied by her sister for follow-up of her CAD, permanent atrial fibrillation,  HFpEF.  Since she was last evaluated in our office, her husband unfortunately passed away, and she had been caring for her, she also lost her dog that was a close companion, and is subsequently had to move into a long-term care facility.  All of this has been understandably stressful for her and she is dealing with it the best she can.  She does not really have any formal complaints today.  She does mention in passing she has had a few episodes where she has fallen at her facility, really cannot attribute what her falls are related to.  She has some edema in her legs but is at baseline.  She has some DOE but is also at baseline. She denies chest pain, palpitations, pnd, orthopnea, n, v, dizziness, syncope, weight gain, or early satiety.     ROS: Review of Systems  Cardiovascular:  Positive for leg swelling.  Musculoskeletal:  Positive for back pain.  All other systems reviewed and are negative.    Studies Reviewed: .        Cardiac Studies & Procedures   ______________________________________________________________________________________________     ECHOCARDIOGRAM  ECHOCARDIOGRAM COMPLETE 02/26/2024  Narrative ECHOCARDIOGRAM REPORT    Patient Name:   [name] [name] Date of Exam: 02/26/2024 Medical Rec #:  [id number]    Height:       63.0 in Accession #:    [id number]   Weight:       191.0 lb Date of Birth:  [birth year]   BSA:          1.896 m Patient Age:    77 years     BP:  100/58 mmHg Patient Gender: F            HR:           61 bpm. Exam Location:  [place]  Procedure: 2D Echo (Both Spectral and Color Flow Doppler were utilized during procedure).  Indications:    DOE (dyspnea on exertion) [R06.09]  History:        Patient has prior history of Echocardiogram examinations, most recent 05/03/2021. CHF; Arrythmias:Atrial Fibrillation.  Sonographer:    Lynwood Silvas RDCS Referring Phys: [phone] [name] Georgi Tuel  IMPRESSIONS   1. Left ventricular ejection fraction, by  estimation, is 60 to 65%. The left ventricle has normal function. The left ventricle has no regional wall motion abnormalities. There is moderate left ventricular hypertrophy. Left ventricular diastolic parameters are indeterminate. The average left ventricular global longitudinal strain is -15.5 %. The global longitudinal strain is abnormal. 2. Right ventricular systolic function is normal. The right ventricular size is normal. There is normal pulmonary artery systolic pressure. 3. Left atrial size was severely dilated. 4. Right atrial size was mild to moderately dilated. 5. The mitral valve is normal in structure. Mild to moderate mitral valve regurgitation. No evidence of mitral stenosis. 6. The aortic valve is normal in structure. Aortic valve regurgitation is not visualized. No aortic stenosis is present. 7. The inferior vena cava is normal in size with greater than 50% respiratory variability, suggesting right atrial pressure of 3 mmHg.  FINDINGS Left Ventricle: Left ventricular ejection fraction, by estimation, is 60 to 65%. The left ventricle has normal function. The left ventricle has no regional wall motion abnormalities. The average left ventricular global longitudinal strain is -15.5 %. Strain was performed and the global longitudinal strain is abnormal. The left ventricular internal cavity size was normal in size. There is moderate left ventricular hypertrophy. Left ventricular diastolic parameters are indeterminate.  Right Ventricle: The right ventricular size is normal. No increase in right ventricular wall thickness. Right ventricular systolic function is normal. There is normal pulmonary artery systolic pressure. The tricuspid regurgitant velocity is 2.10 m/s, and with an assumed right atrial pressure of 3 mmHg, the estimated right ventricular systolic pressure is 20.6 mmHg.  Left Atrium: Left atrial size was severely dilated.  Right Atrium: Right atrial size was mild to moderately  dilated.  Pericardium: There is no evidence of pericardial effusion.  Mitral Valve: The mitral valve is normal in structure. Mild to moderate mitral valve regurgitation. No evidence of mitral valve stenosis.  Tricuspid Valve: The tricuspid valve is normal in structure. Tricuspid valve regurgitation is not demonstrated. No evidence of tricuspid stenosis.  Aortic Valve: The aortic valve is normal in structure. Aortic valve regurgitation is not visualized. No aortic stenosis is present.  Pulmonic Valve: The pulmonic valve was normal in structure. Pulmonic valve regurgitation is not visualized. No evidence of pulmonic stenosis.  Aorta: The aortic root is normal in size and structure.  Venous: The inferior vena cava is normal in size with greater than 50% respiratory variability, suggesting right atrial pressure of 3 mmHg.  IAS/Shunts: No atrial level shunt detected by color flow Doppler.   LEFT VENTRICLE PLAX 2D LVIDd:         4.50 cm   Diastology LVIDs:         3.00 cm   LV e' medial:    6.74 cm/s LV PW:         1.50 cm   LV E/e' medial:  17.9 LV IVS:  1.50 cm   LV e' lateral:   8.49 cm/s LVOT diam:     2.00 cm   LV E/e' lateral: 14.2 LV SV:         68 LV SV Index:   36        2D Longitudinal Strain LVOT Area:     3.14 cm  2D Strain GLS Avg:     -15.5 %   RIGHT VENTRICLE          IVC RV Basal diam:  3.30 cm  IVC diam: 1.30 cm  LEFT ATRIUM              Index        RIGHT ATRIUM           Index LA diam:        4.10 cm  2.16 cm/m   RA Area:     25.60 cm LA Vol (A2C):   134.0 ml 70.67 ml/m  RA Volume:   72.70 ml  38.34 ml/m LA Vol (A4C):   186.0 ml 98.09 ml/m LA Biplane Vol: 157.0 ml 82.80 ml/m AORTIC VALVE LVOT Vmax:   95.90 cm/s LVOT Vmean:  65.500 cm/s LVOT VTI:    0.216 m  AORTA Ao Root diam: 2.40 cm Ao Asc diam:  3.10 cm Ao Desc diam: 2.20 cm  MV E velocity: 120.50 cm/s  TRICUSPID VALVE MV A velocity: 67.60 cm/s   TR Peak grad:   17.6 mmHg MV E/A ratio:   1.78         TR Vmax:        210.00 cm/s  SHUNTS Systemic VTI:  0.22 m Systemic Diam: 2.00 cm  Kayceon Oki Crape MD Electronically signed by Regnia Mathwig Crape MD Signature Date/Time: 02/26/2024/2:42:55 PM    Final      CT SCANS  CT CORONARY MORPH W/CTA COR W/SCORE 01/27/2019  Addendum 01/27/2019  5:17 PM ADDENDUM REPORT: 01/27/2019 17:15  HISTORY: angina  EXAM: Cardiac/Coronary  CT  TECHNIQUE: The patient was scanned on a [employer].  PROTOCOL: A 120 kV prospective scan was triggered in the descending thoracic aorta at 111 HU's. Axial non-contrast 3 mm slices were carried out through the heart. The data set was analyzed on a dedicated work station and scored using the Agatson method. Gantry rotation speed was 250 msecs and collimation was .6 mm. Beta blockade and 0.8 mg of sl NTG was given. The 3D data set was reconstructed in 5% intervals of the entire cardiac cycle due to atrial fibrillation. Diastolic phases were analyzed on a dedicated work station using MPR, MIP and VRT modes. The patient received 80mL OMNIPAQUE  IOHEXOL  350 MG/ML SOLN of contrast.  FINDINGS: Coronary calcium  score: The patient's coronary artery calcium  score is 240, which places the patient in the 81 percentile.  Coronary arteries: Normal coronary origins.  Right dominance.  Right Coronary Artery: Mild mixed atherosclerotic plaque in the proximal RCA, 25-49% stenosis.  Left Main Coronary Artery: Minimal mixed atherosclerotic plaque in the LM, <25% stenosis.  Left Anterior Descending Coronary Artery: Mild scattered mixed atherosclerotic plaque in the LAD, 25-49% stenosis.  Ramus intermedius: Small caliber ramus intermedius branch is patent.  Left Circumflex Artery: Possible mild atherosclerotic plaque the trifurcation of the first and second OM and the distal circumflex artery, 25-49% stenosis.  Aorta:  Normal size.  No calcifications.  No dissection.  Aortic Valve:  Trileaflet.   Mild annular calcifications.  Other findings:  Severe left atrial enlargement.  Normal pulmonary vein  drainage into the left atrium.  Left atrial appendage occluder device in place. Contrast enters the LA appendage, suggesting incomplete occlusion of the LA appendage.  Normal size of the pulmonary artery.  Severe mitral annular calcification.  Patent foramen ovale vs. iatrogenic atrial septal left to right shunt s/p left atrial trans-septal puncture for placement of LA appendage occluder.  IMPRESSION: 1. The patient's coronary artery calcium  score is 240, which places the patient in the 81 percentile.  2. Normal coronary origin with right dominance.  3. Mild CAD, CADRADS = 2. Mild disease in LAD and Left circumflex artery.  4. Left atrial appendage occluder device in place. Contrast enters the LA appendage, suggesting incomplete occlusion of the LA appendage. Cannot exclude thrombus in the LA appendage due to possible incomplete contrast opacification.   Electronically Signed By: Soyla Merck On: 01/27/2019 17:15  Narrative EXAM: OVER-READ INTERPRETATION  CT CHEST  The following report is an over-read performed by radiologist Dr. Toribio Aye of [facility] on 01/27/2019. This over-read does not include interpretation of cardiac or coronary anatomy or pathology. The coronary calcium  score/coronary CTA interpretation by the cardiologist is attached.  COMPARISON:  None.  FINDINGS: Aortic atherosclerosis. Within the visualized portions of the thorax there are no suspicious appearing pulmonary nodules or masses, there is no acute consolidative airspace disease, no pleural effusions, no pneumothorax and no lymphadenopathy. Visualized portions of the upper abdomen demonstrates a shrunken appearance and nodular contour of the liver, indicative of advanced cirrhosis. There are no aggressive appearing lytic or blastic lesions noted in  the visualized portions of the skeleton.  IMPRESSION: 1.  Aortic Atherosclerosis (ICD10-I70.0). 2. Cirrhosis.  Electronically Signed: By: Toribio Aye M.D. On: 01/27/2019 09:34     ______________________________________________________________________________________________      Risk Assessment/Calculations:               Physical Exam:   VS:  BP (!) 136/54 (BP Location: Left Arm, Patient Position: Sitting, Cuff Size: Large)   Pulse 65   Resp 16   Ht 5' 3 (1.6 m)   Wt 188 lb (85.3 kg)   SpO2 96%   BMI 33.30 kg/m    Wt Readings from Last 3 Encounters:  08/11/24 188 lb (85.3 kg)  04/28/24 198 lb (89.8 kg)  04/15/24 195 lb 1.6 oz (88.5 kg)    GEN: Well nourished, well developed in no acute distress NECK: No JVD; No carotid bruits CARDIAC: irregularly irregular, no murmurs, rubs, gallops RESPIRATORY:  Clear to auscultation without rales, wheezing or rhonchi  ABDOMEN: Soft, non-tender, non-distended EXTREMITIES:  trace bilateral edema; No deformity  SKIN: intact  ASSESSMENT AND PLAN: .   Permanent atrial fibrillation- s/p Watchman device in 2017 [facility].  Her rate is controlled.  Continue metoprolol  but decrease to 12.5 mg twice daily.   CAD-nonobstructive per coronary CTA in 2020, continue Coreg 6.25 mg twice daily, continue Imdur  60 mg daily, continue nitroglycerin  as needed, continue Crestor  5 mg daily.  She is limited with her physical activity, mostly dependent on a rollator, encouraged her to be as physically active as possible.  Dyslipidemia-most recent LDL was well-controlled at 52, this appears to be monitored by her PCP, continue Crestor  5 mg daily.  Hypertension-blood pressure is low normal at 136/54, continue Cardura  4 mg daily, continue Avapro  300 mg daily, continue Norvasc 5 mg daily.    HFpEF-NYHA class II, euvolemic, continue metoprolol  tartrate 12.5 mg twice daily, continue Lasix  20 mg Monday Wednesday Friday, continue Avapro  300  mg  daily. Check CBC, CMET.        Dispo: CBC, CMET,  follow-up in 6 months.  Signed, Delon JAYSON Hoover, NP

## 2024-08-11 ENCOUNTER — Encounter: Payer: Self-pay | Admitting: Cardiology

## 2024-08-11 ENCOUNTER — Ambulatory Visit: Attending: Cardiology | Admitting: Cardiology

## 2024-08-11 VITALS — BP 136/54 | HR 65 | Resp 16 | Ht 63.0 in | Wt 188.0 lb

## 2024-08-11 DIAGNOSIS — I482 Chronic atrial fibrillation, unspecified: Secondary | ICD-10-CM

## 2024-08-11 DIAGNOSIS — H9313 Tinnitus, bilateral: Secondary | ICD-10-CM | POA: Diagnosis not present

## 2024-08-11 DIAGNOSIS — R42 Dizziness and giddiness: Secondary | ICD-10-CM | POA: Diagnosis not present

## 2024-08-11 DIAGNOSIS — I5032 Chronic diastolic (congestive) heart failure: Secondary | ICD-10-CM | POA: Diagnosis present

## 2024-08-11 DIAGNOSIS — I10 Essential (primary) hypertension: Secondary | ICD-10-CM

## 2024-08-11 DIAGNOSIS — Z95818 Presence of other cardiac implants and grafts: Secondary | ICD-10-CM | POA: Diagnosis not present

## 2024-08-11 DIAGNOSIS — I2510 Atherosclerotic heart disease of native coronary artery without angina pectoris: Secondary | ICD-10-CM

## 2024-08-11 DIAGNOSIS — H903 Sensorineural hearing loss, bilateral: Secondary | ICD-10-CM | POA: Diagnosis not present

## 2024-08-11 NOTE — Patient Instructions (Addendum)
 Medication Instructions:   DECREASE: Metoprolol  to 12.5mg  twice daily   Lab Work: CMP, CBC- today If you have labs (blood work) drawn today and your tests are completely normal, you will receive your results only by: MyChart Message (if you have MyChart) OR A paper copy in the mail If you have any lab test that is abnormal or we need to change your treatment, we will call you to review the results.   Testing/Procedures: None Ordered   Follow-Up: At [facility], you and your health needs are our priority.  As part of our continuing mission to provide you with exceptional heart care, we have created designated Provider Care Teams.  These Care Teams include your primary Cardiologist (physician) and Advanced Practice Providers (APPs -  Physician Assistants and Nurse Practitioners) who all work together to provide you with the care you need, when you need it.  We recommend signing up for the patient portal called MyChart.  Sign up information is provided on this After Visit Summary.  MyChart is used to connect with patients for Virtual Visits (Telemedicine).  Patients are able to view lab/test results, encounter notes, upcoming appointments, etc.  Non-urgent messages can be sent to your provider as well.   To learn more about what you can do with MyChart, go to [email or web address].    Your next appointment:   6 month(s)  The format for your next appointment:   In Person  Provider:   Delon Hoover, NP   Other Instructions NA

## 2024-08-12 ENCOUNTER — Ambulatory Visit: Payer: Self-pay | Admitting: Cardiology

## 2024-08-12 ENCOUNTER — Telehealth: Payer: Self-pay

## 2024-08-12 LAB — CBC
Hematocrit: 35.3 % (ref 34.0–46.6)
Hemoglobin: 11 g/dL — ABNORMAL LOW (ref 11.1–15.9)
MCH: 27.7 pg (ref 26.6–33.0)
MCHC: 31.2 g/dL — ABNORMAL LOW (ref 31.5–35.7)
MCV: 89 fL (ref 79–97)
Platelets: 129 x10E3/uL — ABNORMAL LOW (ref 150–450)
RBC: 3.97 x10E6/uL (ref 3.77–5.28)
RDW: 13.3 % (ref 11.7–15.4)
WBC: 4.2 x10E3/uL (ref 3.4–10.8)

## 2024-08-12 LAB — COMPREHENSIVE METABOLIC PANEL WITH GFR
ALT: 20 IU/L (ref 0–32)
AST: 36 IU/L (ref 0–40)
Albumin: 3.7 g/dL — ABNORMAL LOW (ref 3.8–4.8)
Alkaline Phosphatase: 107 IU/L (ref 49–135)
BUN/Creatinine Ratio: 14 (ref 12–28)
BUN: 15 mg/dL (ref 8–27)
Bilirubin Total: 0.7 mg/dL (ref 0.0–1.2)
CO2: 24 mmol/L (ref 20–29)
Calcium: 8.5 mg/dL — ABNORMAL LOW (ref 8.7–10.3)
Chloride: 98 mmol/L (ref 96–106)
Creatinine, Ser: 1.04 mg/dL — ABNORMAL HIGH (ref 0.57–1.00)
Globulin, Total: 3.5 g/dL (ref 1.5–4.5)
Glucose: 168 mg/dL — ABNORMAL HIGH (ref 70–99)
Potassium: 3.9 mmol/L (ref 3.5–5.2)
Sodium: 138 mmol/L (ref 134–144)
Total Protein: 7.2 g/dL (ref 6.0–8.5)
eGFR: 55 mL/min/1.73 — ABNORMAL LOW

## 2024-08-12 NOTE — Telephone Encounter (Signed)
 Left message on My Chart per [facility] note regarding lab results. Routed to PCP.

## 2024-08-18 DIAGNOSIS — R5383 Other fatigue: Secondary | ICD-10-CM | POA: Diagnosis not present

## 2024-08-18 DIAGNOSIS — R059 Cough, unspecified: Secondary | ICD-10-CM | POA: Diagnosis not present

## 2024-08-19 DIAGNOSIS — J90 Pleural effusion, not elsewhere classified: Secondary | ICD-10-CM | POA: Diagnosis not present

## 2024-08-19 DIAGNOSIS — J984 Other disorders of lung: Secondary | ICD-10-CM | POA: Diagnosis not present

## 2024-08-20 DIAGNOSIS — J90 Pleural effusion, not elsewhere classified: Secondary | ICD-10-CM | POA: Diagnosis not present

## 2024-08-20 DIAGNOSIS — J9811 Atelectasis: Secondary | ICD-10-CM | POA: Diagnosis not present

## 2024-08-20 DIAGNOSIS — E669 Obesity, unspecified: Secondary | ICD-10-CM | POA: Diagnosis not present

## 2024-08-25 DIAGNOSIS — E119 Type 2 diabetes mellitus without complications: Secondary | ICD-10-CM | POA: Diagnosis not present

## 2024-08-27 DIAGNOSIS — E1140 Type 2 diabetes mellitus with diabetic neuropathy, unspecified: Secondary | ICD-10-CM | POA: Diagnosis not present

## 2024-08-27 DIAGNOSIS — J069 Acute upper respiratory infection, unspecified: Secondary | ICD-10-CM | POA: Diagnosis not present

## 2024-08-27 DIAGNOSIS — I10 Essential (primary) hypertension: Secondary | ICD-10-CM | POA: Diagnosis not present

## 2024-08-27 DIAGNOSIS — R001 Bradycardia, unspecified: Secondary | ICD-10-CM | POA: Diagnosis not present

## 2024-08-28 DIAGNOSIS — K0889 Other specified disorders of teeth and supporting structures: Secondary | ICD-10-CM | POA: Diagnosis not present

## 2024-09-04 DIAGNOSIS — E785 Hyperlipidemia, unspecified: Secondary | ICD-10-CM | POA: Diagnosis not present

## 2024-09-04 DIAGNOSIS — E119 Type 2 diabetes mellitus without complications: Secondary | ICD-10-CM | POA: Diagnosis not present

## 2024-09-04 DIAGNOSIS — E039 Hypothyroidism, unspecified: Secondary | ICD-10-CM | POA: Diagnosis not present

## 2024-09-04 DIAGNOSIS — I2510 Atherosclerotic heart disease of native coronary artery without angina pectoris: Secondary | ICD-10-CM | POA: Diagnosis not present

## 2024-09-16 DIAGNOSIS — M6281 Muscle weakness (generalized): Secondary | ICD-10-CM | POA: Diagnosis not present

## 2024-09-16 DIAGNOSIS — Z741 Need for assistance with personal care: Secondary | ICD-10-CM | POA: Diagnosis not present

## 2024-09-16 DIAGNOSIS — Z7409 Other reduced mobility: Secondary | ICD-10-CM | POA: Diagnosis not present

## 2024-09-16 DIAGNOSIS — R2681 Unsteadiness on feet: Secondary | ICD-10-CM | POA: Diagnosis not present

## 2024-09-17 DIAGNOSIS — Z7409 Other reduced mobility: Secondary | ICD-10-CM | POA: Diagnosis not present

## 2024-09-17 DIAGNOSIS — R2681 Unsteadiness on feet: Secondary | ICD-10-CM | POA: Diagnosis not present

## 2024-09-17 DIAGNOSIS — Z741 Need for assistance with personal care: Secondary | ICD-10-CM | POA: Diagnosis not present

## 2024-09-17 DIAGNOSIS — M6281 Muscle weakness (generalized): Secondary | ICD-10-CM | POA: Diagnosis not present

## 2024-09-18 DIAGNOSIS — Z7409 Other reduced mobility: Secondary | ICD-10-CM | POA: Diagnosis not present

## 2024-09-18 DIAGNOSIS — R2681 Unsteadiness on feet: Secondary | ICD-10-CM | POA: Diagnosis not present

## 2024-09-18 DIAGNOSIS — Z741 Need for assistance with personal care: Secondary | ICD-10-CM | POA: Diagnosis not present

## 2024-09-18 DIAGNOSIS — M6281 Muscle weakness (generalized): Secondary | ICD-10-CM | POA: Diagnosis not present

## 2024-09-21 DIAGNOSIS — R2681 Unsteadiness on feet: Secondary | ICD-10-CM | POA: Diagnosis not present

## 2024-09-21 DIAGNOSIS — Z7409 Other reduced mobility: Secondary | ICD-10-CM | POA: Diagnosis not present

## 2024-09-21 DIAGNOSIS — Z741 Need for assistance with personal care: Secondary | ICD-10-CM | POA: Diagnosis not present

## 2024-09-21 DIAGNOSIS — M6281 Muscle weakness (generalized): Secondary | ICD-10-CM | POA: Diagnosis not present

## 2024-09-22 DIAGNOSIS — Z741 Need for assistance with personal care: Secondary | ICD-10-CM | POA: Diagnosis not present

## 2024-09-22 DIAGNOSIS — R2681 Unsteadiness on feet: Secondary | ICD-10-CM | POA: Diagnosis not present

## 2024-09-22 DIAGNOSIS — Z7409 Other reduced mobility: Secondary | ICD-10-CM | POA: Diagnosis not present

## 2024-09-22 DIAGNOSIS — M6281 Muscle weakness (generalized): Secondary | ICD-10-CM | POA: Diagnosis not present

## 2024-09-23 DIAGNOSIS — M6281 Muscle weakness (generalized): Secondary | ICD-10-CM | POA: Diagnosis not present

## 2024-09-23 DIAGNOSIS — Z7409 Other reduced mobility: Secondary | ICD-10-CM | POA: Diagnosis not present

## 2024-09-23 DIAGNOSIS — Z741 Need for assistance with personal care: Secondary | ICD-10-CM | POA: Diagnosis not present

## 2024-09-23 DIAGNOSIS — R2681 Unsteadiness on feet: Secondary | ICD-10-CM | POA: Diagnosis not present

## 2024-09-24 DIAGNOSIS — Z741 Need for assistance with personal care: Secondary | ICD-10-CM | POA: Diagnosis not present

## 2024-09-24 DIAGNOSIS — Z7409 Other reduced mobility: Secondary | ICD-10-CM | POA: Diagnosis not present

## 2024-09-24 DIAGNOSIS — R2681 Unsteadiness on feet: Secondary | ICD-10-CM | POA: Diagnosis not present

## 2024-09-24 DIAGNOSIS — M6281 Muscle weakness (generalized): Secondary | ICD-10-CM | POA: Diagnosis not present

## 2024-09-25 DIAGNOSIS — R2681 Unsteadiness on feet: Secondary | ICD-10-CM | POA: Diagnosis not present

## 2024-09-25 DIAGNOSIS — M6281 Muscle weakness (generalized): Secondary | ICD-10-CM | POA: Diagnosis not present

## 2024-09-25 DIAGNOSIS — Z7409 Other reduced mobility: Secondary | ICD-10-CM | POA: Diagnosis not present

## 2024-09-25 DIAGNOSIS — Z741 Need for assistance with personal care: Secondary | ICD-10-CM | POA: Diagnosis not present

## 2024-09-28 DIAGNOSIS — M6281 Muscle weakness (generalized): Secondary | ICD-10-CM | POA: Diagnosis not present

## 2024-09-28 DIAGNOSIS — R2681 Unsteadiness on feet: Secondary | ICD-10-CM | POA: Diagnosis not present

## 2024-09-28 DIAGNOSIS — Z741 Need for assistance with personal care: Secondary | ICD-10-CM | POA: Diagnosis not present

## 2024-09-28 DIAGNOSIS — Z7409 Other reduced mobility: Secondary | ICD-10-CM | POA: Diagnosis not present

## 2024-09-29 DIAGNOSIS — Z7409 Other reduced mobility: Secondary | ICD-10-CM | POA: Diagnosis not present

## 2024-09-29 DIAGNOSIS — M6281 Muscle weakness (generalized): Secondary | ICD-10-CM | POA: Diagnosis not present

## 2024-09-29 DIAGNOSIS — Z741 Need for assistance with personal care: Secondary | ICD-10-CM | POA: Diagnosis not present

## 2024-09-29 DIAGNOSIS — R2681 Unsteadiness on feet: Secondary | ICD-10-CM | POA: Diagnosis not present

## 2024-09-30 DIAGNOSIS — R2681 Unsteadiness on feet: Secondary | ICD-10-CM | POA: Diagnosis not present

## 2024-09-30 DIAGNOSIS — Z741 Need for assistance with personal care: Secondary | ICD-10-CM | POA: Diagnosis not present

## 2024-09-30 DIAGNOSIS — M6281 Muscle weakness (generalized): Secondary | ICD-10-CM | POA: Diagnosis not present

## 2024-09-30 DIAGNOSIS — Z7409 Other reduced mobility: Secondary | ICD-10-CM | POA: Diagnosis not present

## 2024-10-01 DIAGNOSIS — Z741 Need for assistance with personal care: Secondary | ICD-10-CM | POA: Diagnosis not present

## 2024-10-01 DIAGNOSIS — R2681 Unsteadiness on feet: Secondary | ICD-10-CM | POA: Diagnosis not present

## 2024-10-01 DIAGNOSIS — M6281 Muscle weakness (generalized): Secondary | ICD-10-CM | POA: Diagnosis not present

## 2024-10-01 DIAGNOSIS — Z7409 Other reduced mobility: Secondary | ICD-10-CM | POA: Diagnosis not present

## 2024-10-05 DIAGNOSIS — Z7409 Other reduced mobility: Secondary | ICD-10-CM | POA: Diagnosis not present

## 2024-10-05 DIAGNOSIS — Z741 Need for assistance with personal care: Secondary | ICD-10-CM | POA: Diagnosis not present

## 2024-10-05 DIAGNOSIS — R2681 Unsteadiness on feet: Secondary | ICD-10-CM | POA: Diagnosis not present

## 2024-10-05 DIAGNOSIS — M6281 Muscle weakness (generalized): Secondary | ICD-10-CM | POA: Diagnosis not present

## 2024-10-06 DIAGNOSIS — Z7409 Other reduced mobility: Secondary | ICD-10-CM | POA: Diagnosis not present

## 2024-10-06 DIAGNOSIS — Z741 Need for assistance with personal care: Secondary | ICD-10-CM | POA: Diagnosis not present

## 2024-10-06 DIAGNOSIS — M6281 Muscle weakness (generalized): Secondary | ICD-10-CM | POA: Diagnosis not present

## 2024-10-06 DIAGNOSIS — R2681 Unsteadiness on feet: Secondary | ICD-10-CM | POA: Diagnosis not present

## 2024-10-07 DIAGNOSIS — Z7409 Other reduced mobility: Secondary | ICD-10-CM | POA: Diagnosis not present

## 2024-10-07 DIAGNOSIS — M6281 Muscle weakness (generalized): Secondary | ICD-10-CM | POA: Diagnosis not present

## 2024-10-07 DIAGNOSIS — Z741 Need for assistance with personal care: Secondary | ICD-10-CM | POA: Diagnosis not present

## 2024-10-07 DIAGNOSIS — R2681 Unsteadiness on feet: Secondary | ICD-10-CM | POA: Diagnosis not present

## 2024-10-08 DIAGNOSIS — R5383 Other fatigue: Secondary | ICD-10-CM | POA: Diagnosis not present

## 2024-10-08 DIAGNOSIS — M6281 Muscle weakness (generalized): Secondary | ICD-10-CM | POA: Diagnosis not present

## 2024-10-08 DIAGNOSIS — R2681 Unsteadiness on feet: Secondary | ICD-10-CM | POA: Diagnosis not present

## 2024-10-08 DIAGNOSIS — R400 Somnolence: Secondary | ICD-10-CM | POA: Diagnosis not present

## 2024-10-08 DIAGNOSIS — G4733 Obstructive sleep apnea (adult) (pediatric): Secondary | ICD-10-CM | POA: Diagnosis not present

## 2024-10-08 DIAGNOSIS — J4520 Mild intermittent asthma, uncomplicated: Secondary | ICD-10-CM | POA: Diagnosis not present

## 2024-10-08 DIAGNOSIS — Z7409 Other reduced mobility: Secondary | ICD-10-CM | POA: Diagnosis not present

## 2024-10-08 DIAGNOSIS — Z741 Need for assistance with personal care: Secondary | ICD-10-CM | POA: Diagnosis not present

## 2024-10-09 DIAGNOSIS — Z7409 Other reduced mobility: Secondary | ICD-10-CM | POA: Diagnosis not present

## 2024-10-09 DIAGNOSIS — Z741 Need for assistance with personal care: Secondary | ICD-10-CM | POA: Diagnosis not present

## 2024-10-09 DIAGNOSIS — M6281 Muscle weakness (generalized): Secondary | ICD-10-CM | POA: Diagnosis not present

## 2024-10-09 DIAGNOSIS — R2681 Unsteadiness on feet: Secondary | ICD-10-CM | POA: Diagnosis not present

## 2024-10-13 DIAGNOSIS — R2681 Unsteadiness on feet: Secondary | ICD-10-CM | POA: Diagnosis not present

## 2024-10-13 DIAGNOSIS — Z741 Need for assistance with personal care: Secondary | ICD-10-CM | POA: Diagnosis not present

## 2024-10-13 DIAGNOSIS — M6281 Muscle weakness (generalized): Secondary | ICD-10-CM | POA: Diagnosis not present

## 2024-10-13 DIAGNOSIS — Z7409 Other reduced mobility: Secondary | ICD-10-CM | POA: Diagnosis not present

## 2024-10-14 DIAGNOSIS — M6281 Muscle weakness (generalized): Secondary | ICD-10-CM | POA: Diagnosis not present

## 2024-10-14 DIAGNOSIS — Z7409 Other reduced mobility: Secondary | ICD-10-CM | POA: Diagnosis not present

## 2024-10-14 DIAGNOSIS — R2681 Unsteadiness on feet: Secondary | ICD-10-CM | POA: Diagnosis not present

## 2024-10-14 DIAGNOSIS — Z741 Need for assistance with personal care: Secondary | ICD-10-CM | POA: Diagnosis not present

## 2025-02-09 ENCOUNTER — Ambulatory Visit: Admitting: Cardiology
